# Patient Record
Sex: Female | Born: 1990 | Race: White | Hispanic: No | Marital: Single | Smoking: Current every day smoker
Health system: Southern US, Community
[De-identification: ages and names within clinical notes are randomized; demographics above are authoritative.]

## PROBLEM LIST (undated history)

## (undated) ENCOUNTER — Inpatient Hospital Stay: Payer: Self-pay

## (undated) ENCOUNTER — Inpatient Hospital Stay (HOSPITAL_COMMUNITY): Payer: Self-pay

## (undated) DIAGNOSIS — G43909 Migraine, unspecified, not intractable, without status migrainosus: Secondary | ICD-10-CM

## (undated) DIAGNOSIS — F329 Major depressive disorder, single episode, unspecified: Secondary | ICD-10-CM

## (undated) DIAGNOSIS — R569 Unspecified convulsions: Secondary | ICD-10-CM

## (undated) DIAGNOSIS — N83209 Unspecified ovarian cyst, unspecified side: Secondary | ICD-10-CM

## (undated) DIAGNOSIS — E119 Type 2 diabetes mellitus without complications: Secondary | ICD-10-CM

## (undated) DIAGNOSIS — R011 Cardiac murmur, unspecified: Secondary | ICD-10-CM

## (undated) DIAGNOSIS — O00109 Unspecified tubal pregnancy without intrauterine pregnancy: Secondary | ICD-10-CM

## (undated) DIAGNOSIS — F419 Anxiety disorder, unspecified: Secondary | ICD-10-CM

## (undated) DIAGNOSIS — D649 Anemia, unspecified: Secondary | ICD-10-CM

## (undated) DIAGNOSIS — F32A Depression, unspecified: Secondary | ICD-10-CM

## (undated) DIAGNOSIS — F319 Bipolar disorder, unspecified: Secondary | ICD-10-CM

## (undated) DIAGNOSIS — K8020 Calculus of gallbladder without cholecystitis without obstruction: Secondary | ICD-10-CM

## (undated) DIAGNOSIS — U071 COVID-19: Secondary | ICD-10-CM

## (undated) DIAGNOSIS — K219 Gastro-esophageal reflux disease without esophagitis: Secondary | ICD-10-CM

## (undated) DIAGNOSIS — Z9289 Personal history of other medical treatment: Secondary | ICD-10-CM

## (undated) DIAGNOSIS — F909 Attention-deficit hyperactivity disorder, unspecified type: Secondary | ICD-10-CM

## (undated) HISTORY — DX: Anxiety disorder, unspecified: F41.9

## (undated) HISTORY — PX: TONSILLECTOMY: SUR1361

## (undated) HISTORY — DX: Attention-deficit hyperactivity disorder, unspecified type: F90.9

## (undated) HISTORY — PX: OTHER SURGICAL HISTORY: SHX169

## (undated) HISTORY — DX: Bipolar disorder, unspecified: F31.9

## (undated) HISTORY — PX: DILATION AND CURETTAGE OF UTERUS: SHX78

## (undated) HISTORY — PX: TONSILLECTOMY AND ADENOIDECTOMY: SHX28

## (undated) MED FILL — Iron Sucrose Inj 20 MG/ML (Fe Equiv): INTRAVENOUS | Qty: 10 | Status: AC

---
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
## 2010-01-28 ENCOUNTER — Ambulatory Visit: Payer: Self-pay | Admitting: Obstetrics and Gynecology

## 2010-02-03 ENCOUNTER — Inpatient Hospital Stay: Payer: Self-pay | Admitting: Obstetrics and Gynecology

## 2010-06-25 ENCOUNTER — Emergency Department: Payer: Self-pay | Admitting: Emergency Medicine

## 2010-07-29 ENCOUNTER — Ambulatory Visit: Payer: Self-pay | Admitting: Unknown Physician Specialty

## 2010-08-01 LAB — PATHOLOGY REPORT

## 2010-08-21 DIAGNOSIS — K219 Gastro-esophageal reflux disease without esophagitis: Secondary | ICD-10-CM | POA: Insufficient documentation

## 2010-09-18 ENCOUNTER — Emergency Department (HOSPITAL_COMMUNITY)
Admission: EM | Admit: 2010-09-18 | Discharge: 2010-09-18 | Payer: Self-pay | Source: Home / Self Care | Admitting: Emergency Medicine

## 2010-11-05 ENCOUNTER — Emergency Department: Payer: Self-pay | Admitting: Emergency Medicine

## 2010-12-14 ENCOUNTER — Emergency Department: Payer: Self-pay | Admitting: Emergency Medicine

## 2010-12-15 ENCOUNTER — Emergency Department: Payer: Self-pay | Admitting: Emergency Medicine

## 2011-01-11 LAB — DIFFERENTIAL
Basophils Relative: 0 % (ref 0–1)
Eosinophils Absolute: 0.2 10*3/uL (ref 0.0–0.7)
Monocytes Relative: 6 % (ref 3–12)
Neutrophils Relative %: 70 % (ref 43–77)

## 2011-01-11 LAB — URINE MICROSCOPIC-ADD ON

## 2011-01-11 LAB — CBC
MCHC: 32.2 g/dL (ref 30.0–36.0)
MCV: 73.7 fL — ABNORMAL LOW (ref 78.0–100.0)
RBC: 5.51 MIL/uL — ABNORMAL HIGH (ref 3.87–5.11)

## 2011-01-11 LAB — URINALYSIS, ROUTINE W REFLEX MICROSCOPIC
Bilirubin Urine: NEGATIVE
Hgb urine dipstick: NEGATIVE
Specific Gravity, Urine: >1.03 — ABNORMAL HIGH (ref 1.005–1.030)
Urobilinogen, UA: 0.2 mg/dL (ref 0.0–1.0)
pH: 6 (ref 5.0–8.0)

## 2011-01-11 LAB — RPR: RPR Ser Ql: NONREACTIVE

## 2011-01-28 ENCOUNTER — Emergency Department: Payer: Self-pay | Admitting: Emergency Medicine

## 2011-02-10 ENCOUNTER — Ambulatory Visit: Payer: Self-pay | Admitting: Family Medicine

## 2011-02-17 ENCOUNTER — Ambulatory Visit: Payer: Self-pay | Admitting: Family Medicine
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
## 2011-03-06 ENCOUNTER — Emergency Department: Payer: Self-pay | Admitting: Emergency Medicine

## 2011-04-14 ENCOUNTER — Ambulatory Visit: Payer: Self-pay | Admitting: Emergency Medicine

## 2011-07-01 ENCOUNTER — Emergency Department: Payer: Self-pay | Admitting: Unknown Physician Specialty

## 2011-07-13 ENCOUNTER — Emergency Department: Payer: Self-pay | Admitting: Unknown Physician Specialty

## 2011-08-02 ENCOUNTER — Emergency Department: Payer: Self-pay | Admitting: Emergency Medicine

## 2011-08-08 ENCOUNTER — Inpatient Hospital Stay: Payer: Self-pay | Admitting: Psychiatry

## 2011-08-17 ENCOUNTER — Emergency Department: Payer: Self-pay | Admitting: *Deleted

## 2011-08-30 ENCOUNTER — Emergency Department: Payer: Self-pay | Admitting: Emergency Medicine

## 2011-09-01 ENCOUNTER — Inpatient Hospital Stay: Payer: Self-pay | Admitting: Psychiatry

## 2011-10-06 HISTORY — PX: SALPINGECTOMY: SHX328

## 2011-12-06 ENCOUNTER — Emergency Department: Payer: Self-pay | Admitting: Emergency Medicine

## 2011-12-06 LAB — URINALYSIS, COMPLETE
Blood: NEGATIVE
Glucose,UR: NEGATIVE mg/dL (ref 0–75)
Nitrite: NEGATIVE
RBC,UR: <1 /HPF (ref 0–5)
Specific Gravity: 1.021 (ref 1.003–1.030)
Squamous Epithelial: 7

## 2011-12-06 LAB — COMPREHENSIVE METABOLIC PANEL
Albumin: 3.9 g/dL (ref 3.4–5.0)
Alkaline Phosphatase: 76 U/L (ref 50–136)
Bilirubin,Total: 0.3 mg/dL (ref 0.2–1.0)
Calcium, Total: 9.2 mg/dL (ref 8.5–10.1)
Creatinine: 0.73 mg/dL (ref 0.60–1.30)
Glucose: 93 mg/dL (ref 65–99)
Osmolality: 278 (ref 275–301)
Sodium: 140 mmol/L (ref 136–145)
Total Protein: 8.2 g/dL (ref 6.4–8.2)

## 2011-12-06 LAB — CBC
MCHC: 32.8 g/dL (ref 32.0–36.0)
MCV: 79 fL — ABNORMAL LOW (ref 80–100)
Platelet: 359 10*3/uL (ref 150–440)
RDW: 15.1 % — ABNORMAL HIGH (ref 11.5–14.5)
WBC: 11.8 10*3/uL — ABNORMAL HIGH (ref 3.6–11.0)

## 2011-12-12 ENCOUNTER — Emergency Department: Payer: Self-pay | Admitting: Emergency Medicine

## 2011-12-12 LAB — URINALYSIS, COMPLETE
Bilirubin,UR: NEGATIVE
Ketone: NEGATIVE
Nitrite: NEGATIVE
Ph: 5 (ref 4.5–8.0)
Protein: NEGATIVE
RBC,UR: 8 /HPF (ref 0–5)
Specific Gravity: 1.021 (ref 1.003–1.030)
Squamous Epithelial: 22

## 2011-12-12 LAB — COMPREHENSIVE METABOLIC PANEL
Anion Gap: 12 (ref 7–16)
BUN: 8 mg/dL (ref 7–18)
Bilirubin,Total: 0.2 mg/dL (ref 0.2–1.0)
Chloride: 104 mmol/L (ref 98–107)
EGFR (African American): >60
EGFR (Non-African Amer.): >60
Potassium: 3.7 mmol/L (ref 3.5–5.1)
SGOT(AST): 17 U/L (ref 15–37)
Total Protein: 7.9 g/dL (ref 6.4–8.2)

## 2011-12-12 LAB — CBC
MCH: 25.8 pg — ABNORMAL LOW (ref 26.0–34.0)
MCHC: 33.1 g/dL (ref 32.0–36.0)
MCV: 78 fL — ABNORMAL LOW (ref 80–100)
Platelet: 326 10*3/uL (ref 150–440)
RBC: 5.16 10*6/uL (ref 3.80–5.20)
RDW: 15.4 % — ABNORMAL HIGH (ref 11.5–14.5)
WBC: 11.5 10*3/uL — ABNORMAL HIGH (ref 3.6–11.0)

## 2011-12-12 LAB — PREGNANCY, URINE: Pregnancy Test, Urine: NEGATIVE m[IU]/mL

## 2011-12-22 ENCOUNTER — Emergency Department: Payer: Self-pay | Admitting: Emergency Medicine

## 2011-12-22 LAB — URINALYSIS, COMPLETE
Blood: NEGATIVE
Glucose,UR: NEGATIVE mg/dL (ref 0–75)
Nitrite: NEGATIVE
Ph: 6 (ref 4.5–8.0)
Protein: NEGATIVE
Specific Gravity: 1.025 (ref 1.003–1.030)
WBC UR: 6 /HPF (ref 0–5)

## 2011-12-22 LAB — DRUG SCREEN, URINE
Amphetamines, Ur Screen: NEGATIVE (ref ?–1000)
Barbiturates, Ur Screen: POSITIVE (ref ?–200)
Benzodiazepine, Ur Scrn: NEGATIVE (ref ?–200)
Cocaine Metabolite,Ur ~~LOC~~: NEGATIVE (ref ?–300)
Methadone, Ur Screen: NEGATIVE (ref ?–300)
Opiate, Ur Screen: NEGATIVE (ref ?–300)
Phencyclidine (PCP) Ur S: NEGATIVE (ref ?–25)
Tricyclic, Ur Screen: NEGATIVE (ref ?–1000)

## 2011-12-22 LAB — PREGNANCY, URINE: Pregnancy Test, Urine: NEGATIVE m[IU]/mL

## 2011-12-23 LAB — BASIC METABOLIC PANEL
Anion Gap: 16 (ref 7–16)
BUN: 8 mg/dL (ref 7–18)
Creatinine: 0.74 mg/dL (ref 0.60–1.30)
EGFR (African American): >60
Glucose: 107 mg/dL — ABNORMAL HIGH (ref 65–99)
Sodium: 144 mmol/L (ref 136–145)

## 2011-12-23 LAB — CBC
HCT: 37 % (ref 35.0–47.0)
HGB: 12.1 g/dL (ref 12.0–16.0)
MCHC: 32.7 g/dL (ref 32.0–36.0)
MCV: 78 fL — ABNORMAL LOW (ref 80–100)
WBC: 12.2 10*3/uL — ABNORMAL HIGH (ref 3.6–11.0)

## 2011-12-23 LAB — CARBAMAZEPINE LEVEL, TOTAL: Carbamazepine: <0.5 ug/mL — ABNORMAL LOW (ref 4.0–12.0)

## 2012-01-18 ENCOUNTER — Emergency Department: Payer: Self-pay | Admitting: *Deleted

## 2012-02-17 ENCOUNTER — Emergency Department: Payer: Self-pay | Admitting: Unknown Physician Specialty

## 2012-02-17 LAB — CBC WITH DIFFERENTIAL/PLATELET
Basophil #: 0 10*3/uL (ref 0.0–0.1)
Eosinophil #: 0.5 10*3/uL (ref 0.0–0.7)
HGB: 13.1 g/dL (ref 12.0–16.0)
Lymphocyte %: 23.9 %
MCH: 25.4 pg — ABNORMAL LOW (ref 26.0–34.0)
Neutrophil #: 7.1 10*3/uL — ABNORMAL HIGH (ref 1.4–6.5)
Neutrophil %: 64.5 %
Platelet: 304 10*3/uL (ref 150–440)
WBC: 11 10*3/uL (ref 3.6–11.0)

## 2012-02-17 LAB — BASIC METABOLIC PANEL
Anion Gap: 9 (ref 7–16)
Chloride: 107 mmol/L (ref 98–107)
Co2: 26 mmol/L (ref 21–32)
Sodium: 142 mmol/L (ref 136–145)

## 2012-02-28 ENCOUNTER — Emergency Department: Payer: Self-pay | Admitting: Emergency Medicine

## 2012-02-29 ENCOUNTER — Encounter (HOSPITAL_COMMUNITY): Payer: Self-pay | Admitting: *Deleted

## 2012-02-29 ENCOUNTER — Emergency Department: Payer: Self-pay | Admitting: Emergency Medicine

## 2012-02-29 ENCOUNTER — Emergency Department (HOSPITAL_COMMUNITY)
Admission: EM | Admit: 2012-02-29 | Discharge: 2012-02-29 | Discharge status: Against Medical Advice | Payer: Self-pay | Attending: Emergency Medicine | Admitting: Emergency Medicine

## 2012-02-29 DIAGNOSIS — Z0389 Encounter for observation for other suspected diseases and conditions ruled out: Secondary | ICD-10-CM | POA: Insufficient documentation

## 2012-02-29 LAB — HEPATIC FUNCTION PANEL A (ARMC)
Alkaline Phosphatase: 76 U/L (ref 50–136)
Bilirubin, Direct: <0.1 mg/dL (ref 0.00–0.20)
SGOT(AST): 14 U/L — ABNORMAL LOW (ref 15–37)
SGPT (ALT): 23 U/L
Total Protein: 7.6 g/dL (ref 6.4–8.2)

## 2012-02-29 LAB — BASIC METABOLIC PANEL
BUN: 9 mg/dL (ref 7–18)
Calcium, Total: 9 mg/dL (ref 8.5–10.1)
Co2: 25 mmol/L (ref 21–32)
EGFR (African American): >60
EGFR (Non-African Amer.): >60
Potassium: 3.9 mmol/L (ref 3.5–5.1)

## 2012-02-29 LAB — CBC
HCT: 41.2 % (ref 35.0–47.0)
HGB: 13 g/dL (ref 12.0–16.0)
MCV: 78 fL — ABNORMAL LOW (ref 80–100)
Platelet: 359 10*3/uL (ref 150–440)
RBC: 5.31 10*6/uL — ABNORMAL HIGH (ref 3.80–5.20)
RDW: 15.3 % — ABNORMAL HIGH (ref 11.5–14.5)
WBC: 11 10*3/uL (ref 3.6–11.0)

## 2012-02-29 LAB — URINALYSIS, COMPLETE
Bacteria: NONE SEEN
Nitrite: NEGATIVE
Protein: NEGATIVE
RBC,UR: <1 /HPF (ref 0–5)
Specific Gravity: 1.005 (ref 1.003–1.030)
Squamous Epithelial: 1

## 2012-02-29 NOTE — ED Notes (Signed)
Pt no in waiting room no answer x2

## 2012-02-29 NOTE — ED Notes (Signed)
abd pain with nv for 2 days earache and sorethroat today

## 2012-02-29 NOTE — ED Notes (Signed)
Patient not located in the WR at this time

## 2012-02-29 NOTE — ED Notes (Signed)
Pt not in triage or answer in waiting room

## 2012-02-29 NOTE — ED Notes (Signed)
The pt says she cannot void at present 

## 2012-03-02 ENCOUNTER — Emergency Department: Payer: Self-pay | Admitting: Emergency Medicine

## 2012-03-08 ENCOUNTER — Ambulatory Visit: Payer: Self-pay | Admitting: Surgery

## 2012-03-08 LAB — HEPATIC FUNCTION PANEL A (ARMC)
Alkaline Phosphatase: 86 U/L (ref 50–136)
Bilirubin, Direct: <0.1 mg/dL (ref 0.00–0.20)
Bilirubin,Total: 0.2 mg/dL (ref 0.2–1.0)
SGOT(AST): 14 U/L — ABNORMAL LOW (ref 15–37)
Total Protein: 7.5 g/dL (ref 6.4–8.2)

## 2012-03-09 ENCOUNTER — Emergency Department: Payer: Self-pay | Admitting: Emergency Medicine

## 2012-03-09 ENCOUNTER — Ambulatory Visit: Payer: Self-pay | Admitting: Surgery

## 2012-03-09 LAB — COMPREHENSIVE METABOLIC PANEL
Anion Gap: 10 (ref 7–16)
Bilirubin,Total: 0.4 mg/dL (ref 0.2–1.0)
Chloride: 107 mmol/L (ref 98–107)
Co2: 23 mmol/L (ref 21–32)
Creatinine: 0.61 mg/dL (ref 0.60–1.30)
EGFR (African American): >60
EGFR (Non-African Amer.): >60
Osmolality: 277 (ref 275–301)
SGPT (ALT): 19 U/L
Sodium: 140 mmol/L (ref 136–145)
Total Protein: 7.7 g/dL (ref 6.4–8.2)

## 2012-03-09 LAB — CBC
HCT: 42 % (ref 35.0–47.0)
MCH: 24.5 pg — ABNORMAL LOW (ref 26.0–34.0)
MCHC: 31.2 g/dL — ABNORMAL LOW (ref 32.0–36.0)
MCV: 78 fL — ABNORMAL LOW (ref 80–100)
Platelet: 312 10*3/uL (ref 150–440)
RBC: 5.36 10*6/uL — ABNORMAL HIGH (ref 3.80–5.20)
RDW: 15.7 % — ABNORMAL HIGH (ref 11.5–14.5)
WBC: 10.7 10*3/uL (ref 3.6–11.0)

## 2012-03-18 ENCOUNTER — Emergency Department: Payer: Self-pay | Admitting: Unknown Physician Specialty

## 2012-03-18 LAB — URINALYSIS, COMPLETE
Bilirubin,UR: NEGATIVE
Glucose,UR: NEGATIVE mg/dL (ref 0–75)
Ketone: NEGATIVE
Nitrite: NEGATIVE
Ph: 7 (ref 4.5–8.0)
RBC,UR: 11 /HPF (ref 0–5)
Specific Gravity: 1.02 (ref 1.003–1.030)
Squamous Epithelial: 23
WBC UR: 10 /HPF (ref 0–5)

## 2012-03-18 LAB — CBC
HCT: 42 % (ref 35.0–47.0)
HGB: 13.2 g/dL (ref 12.0–16.0)
MCH: 24.5 pg — ABNORMAL LOW (ref 26.0–34.0)
MCHC: 31.4 g/dL — ABNORMAL LOW (ref 32.0–36.0)
Platelet: 365 10*3/uL (ref 150–440)
RBC: 5.39 10*6/uL — ABNORMAL HIGH (ref 3.80–5.20)
RDW: 15.7 % — ABNORMAL HIGH (ref 11.5–14.5)

## 2012-03-18 LAB — COMPREHENSIVE METABOLIC PANEL
Albumin: 3.6 g/dL (ref 3.4–5.0)
BUN: 6 mg/dL — ABNORMAL LOW (ref 7–18)
Bilirubin,Total: 0.5 mg/dL (ref 0.2–1.0)
Chloride: 106 mmol/L (ref 98–107)
Co2: 23 mmol/L (ref 21–32)
Creatinine: 0.5 mg/dL — ABNORMAL LOW (ref 0.60–1.30)
EGFR (Non-African Amer.): >60
Glucose: 83 mg/dL (ref 65–99)
Osmolality: 271 (ref 275–301)
Potassium: 4.4 mmol/L (ref 3.5–5.1)
SGOT(AST): 22 U/L (ref 15–37)
SGPT (ALT): 18 U/L
Sodium: 137 mmol/L (ref 136–145)
Total Protein: 7.8 g/dL (ref 6.4–8.2)

## 2012-03-18 LAB — PREGNANCY, URINE: Pregnancy Test, Urine: NEGATIVE m[IU]/mL

## 2012-03-19 ENCOUNTER — Emergency Department: Payer: Self-pay | Admitting: Unknown Physician Specialty

## 2012-03-19 LAB — CBC
HCT: 42.2 % (ref 35.0–47.0)
HGB: 13 g/dL (ref 12.0–16.0)
MCH: 24.3 pg — ABNORMAL LOW (ref 26.0–34.0)
MCHC: 30.9 g/dL — ABNORMAL LOW (ref 32.0–36.0)
MCV: 79 fL — ABNORMAL LOW (ref 80–100)
RBC: 5.37 10*6/uL — ABNORMAL HIGH (ref 3.80–5.20)

## 2012-03-19 LAB — URINALYSIS, COMPLETE
Glucose,UR: NEGATIVE mg/dL (ref 0–75)
Nitrite: NEGATIVE
Ph: 7 (ref 4.5–8.0)
Protein: 100
RBC,UR: 8 /HPF (ref 0–5)
Specific Gravity: 1.017 (ref 1.003–1.030)

## 2012-03-19 LAB — PREGNANCY, URINE: Pregnancy Test, Urine: NEGATIVE m[IU]/mL

## 2012-03-19 LAB — HCG, QUANTITATIVE, PREGNANCY: Beta Hcg, Quant.: <1 m[IU]/mL — ABNORMAL LOW

## 2012-03-26 ENCOUNTER — Emergency Department: Payer: Self-pay | Admitting: Emergency Medicine

## 2012-04-13 ENCOUNTER — Emergency Department: Payer: Self-pay | Admitting: Internal Medicine

## 2012-04-13 LAB — URINALYSIS, COMPLETE
Glucose,UR: NEGATIVE mg/dL (ref 0–75)
RBC,UR: 34 /HPF (ref 0–5)
Specific Gravity: 1.029 (ref 1.003–1.030)
Squamous Epithelial: 11

## 2012-04-13 LAB — CBC
HCT: 39.6 % (ref 35.0–47.0)
HGB: 12.6 g/dL (ref 12.0–16.0)
MCV: 77 fL — ABNORMAL LOW (ref 80–100)
Platelet: 347 10*3/uL (ref 150–440)
RBC: 5.17 10*6/uL (ref 3.80–5.20)
WBC: 11.4 10*3/uL — ABNORMAL HIGH (ref 3.6–11.0)

## 2012-04-13 LAB — HCG, QUANTITATIVE, PREGNANCY: Beta Hcg, Quant.: <1 m[IU]/mL — ABNORMAL LOW

## 2012-04-18 ENCOUNTER — Emergency Department: Payer: Self-pay | Admitting: Emergency Medicine

## 2012-05-02 ENCOUNTER — Emergency Department: Payer: Self-pay | Admitting: Emergency Medicine

## 2012-05-23 ENCOUNTER — Emergency Department: Payer: Self-pay | Admitting: Emergency Medicine

## 2012-05-25 ENCOUNTER — Emergency Department: Payer: Self-pay | Admitting: Emergency Medicine

## 2012-05-26 LAB — CBC
HCT: 39.2 % (ref 35.0–47.0)
HGB: 12.7 g/dL (ref 12.0–16.0)
MCH: 24.4 pg — ABNORMAL LOW (ref 26.0–34.0)
MCHC: 32.3 g/dL (ref 32.0–36.0)
RDW: 15.6 % — ABNORMAL HIGH (ref 11.5–14.5)

## 2012-05-26 LAB — COMPREHENSIVE METABOLIC PANEL
Alkaline Phosphatase: 88 U/L (ref 50–136)
Anion Gap: 7 (ref 7–16)
BUN: 16 mg/dL (ref 7–18)
Bilirubin,Total: 0.1 mg/dL — ABNORMAL LOW (ref 0.2–1.0)
Chloride: 108 mmol/L — ABNORMAL HIGH (ref 98–107)
Co2: 27 mmol/L (ref 21–32)
Creatinine: 0.93 mg/dL (ref 0.60–1.30)
EGFR (African American): >60
Glucose: 103 mg/dL — ABNORMAL HIGH (ref 65–99)
Potassium: 3.7 mmol/L (ref 3.5–5.1)
SGOT(AST): 21 U/L (ref 15–37)
SGPT (ALT): 20 U/L (ref 12–78)
Sodium: 142 mmol/L (ref 136–145)
Total Protein: 7.6 g/dL (ref 6.4–8.2)

## 2012-05-26 LAB — TSH: Thyroid Stimulating Horm: 2.58 u[IU]/mL

## 2012-06-17 ENCOUNTER — Emergency Department: Payer: Self-pay | Admitting: Emergency Medicine

## 2012-06-17 LAB — CBC WITH DIFFERENTIAL/PLATELET
Basophil #: 0.1 10*3/uL (ref 0.0–0.1)
Basophil %: 0.7 %
Eosinophil #: 0.2 10*3/uL (ref 0.0–0.7)
Eosinophil %: 1.6 %
Lymphocyte #: 2.9 10*3/uL (ref 1.0–3.6)
Lymphocyte %: 24.3 %
MCH: 23.5 pg — ABNORMAL LOW (ref 26.0–34.0)
MCHC: 31.3 g/dL — ABNORMAL LOW (ref 32.0–36.0)
MCV: 75 fL — ABNORMAL LOW (ref 80–100)
Monocyte #: 0.9 x10 3/mm (ref 0.2–0.9)
Monocyte %: 8 %
Neutrophil #: 7.7 10*3/uL — ABNORMAL HIGH (ref 1.4–6.5)
Platelet: 331 10*3/uL (ref 150–440)
RDW: 15.6 % — ABNORMAL HIGH (ref 11.5–14.5)
WBC: 11.8 10*3/uL — ABNORMAL HIGH (ref 3.6–11.0)

## 2012-06-17 LAB — BASIC METABOLIC PANEL
Anion Gap: 9 (ref 7–16)
BUN: 10 mg/dL (ref 7–18)
Calcium, Total: 9 mg/dL (ref 8.5–10.1)
Chloride: 107 mmol/L (ref 98–107)
Co2: 26 mmol/L (ref 21–32)
Creatinine: 0.77 mg/dL (ref 0.60–1.30)
Glucose: 93 mg/dL (ref 65–99)
Osmolality: 282 (ref 275–301)
Potassium: 3.7 mmol/L (ref 3.5–5.1)
Sodium: 142 mmol/L (ref 136–145)

## 2012-06-17 LAB — URINALYSIS, COMPLETE
Bilirubin,UR: NEGATIVE
Ketone: NEGATIVE
Ph: 6 (ref 4.5–8.0)
Specific Gravity: 1.026 (ref 1.003–1.030)
Squamous Epithelial: 5

## 2012-07-09 ENCOUNTER — Emergency Department: Payer: Self-pay | Admitting: Emergency Medicine

## 2012-07-09 LAB — BASIC METABOLIC PANEL
Anion Gap: 11 (ref 7–16)
BUN: 7 mg/dL (ref 7–18)
Chloride: 107 mmol/L (ref 98–107)
Creatinine: 0.75 mg/dL (ref 0.60–1.30)
EGFR (African American): >60
Glucose: 93 mg/dL (ref 65–99)
Sodium: 142 mmol/L (ref 136–145)

## 2012-07-09 LAB — CBC
HCT: 41.3 % (ref 35.0–47.0)
HGB: 13.4 g/dL (ref 12.0–16.0)
MCH: 24.4 pg — ABNORMAL LOW (ref 26.0–34.0)
MCHC: 32.3 g/dL (ref 32.0–36.0)
MCV: 75 fL — ABNORMAL LOW (ref 80–100)
RBC: 5.48 10*6/uL — ABNORMAL HIGH (ref 3.80–5.20)
RDW: 15.7 % — ABNORMAL HIGH (ref 11.5–14.5)
WBC: 12.9 10*3/uL — ABNORMAL HIGH (ref 3.6–11.0)

## 2012-07-09 LAB — URINALYSIS, COMPLETE
Bilirubin,UR: NEGATIVE
Blood: NEGATIVE
Glucose,UR: NEGATIVE mg/dL (ref 0–75)
Ketone: NEGATIVE
Ph: 6 (ref 4.5–8.0)
Protein: NEGATIVE
RBC,UR: 2 /HPF (ref 0–5)
Squamous Epithelial: 6

## 2012-07-18 ENCOUNTER — Emergency Department: Payer: Self-pay | Admitting: Emergency Medicine

## 2012-07-18 LAB — BASIC METABOLIC PANEL
Calcium, Total: 8.8 mg/dL (ref 8.5–10.1)
Co2: 25 mmol/L (ref 21–32)
EGFR (African American): >60
Glucose: 91 mg/dL (ref 65–99)
Sodium: 140 mmol/L (ref 136–145)

## 2012-07-18 LAB — CBC WITH DIFFERENTIAL/PLATELET
Basophil %: 0.9 %
Eosinophil %: 2.1 %
Lymphocyte #: 3.1 10*3/uL (ref 1.0–3.6)
Lymphocyte %: 22.7 %
MCH: 24.8 pg — ABNORMAL LOW (ref 26.0–34.0)
MCV: 75 fL — ABNORMAL LOW (ref 80–100)
Monocyte %: 6.6 %
Neutrophil #: 9.3 10*3/uL — ABNORMAL HIGH (ref 1.4–6.5)
WBC: 13.8 10*3/uL — ABNORMAL HIGH (ref 3.6–11.0)

## 2012-07-18 LAB — URINALYSIS, COMPLETE
Bacteria: NONE SEEN
Leukocyte Esterase: NEGATIVE
Ph: 6 (ref 4.5–8.0)
Protein: NEGATIVE
RBC,UR: 2 /HPF (ref 0–5)
Squamous Epithelial: 4

## 2012-07-18 LAB — VALPROIC ACID LEVEL: Valproic Acid: <3 ug/mL — ABNORMAL LOW

## 2012-08-01 ENCOUNTER — Inpatient Hospital Stay: Payer: Self-pay | Admitting: Psychiatry

## 2012-08-01 LAB — URINALYSIS, COMPLETE
Bilirubin,UR: NEGATIVE
Ketone: NEGATIVE
Nitrite: NEGATIVE
Protein: NEGATIVE
Specific Gravity: 1.02 (ref 1.003–1.030)
Squamous Epithelial: 22
WBC UR: 15 /HPF (ref 0–5)

## 2012-08-01 LAB — COMPREHENSIVE METABOLIC PANEL
Alkaline Phosphatase: 72 U/L (ref 50–136)
Anion Gap: 9 (ref 7–16)
Calcium, Total: 8.8 mg/dL (ref 8.5–10.1)
EGFR (African American): >60
Glucose: 85 mg/dL (ref 65–99)
Potassium: 4 mmol/L (ref 3.5–5.1)
SGOT(AST): 20 U/L (ref 15–37)
SGPT (ALT): 24 U/L (ref 12–78)

## 2012-08-01 LAB — DRUG SCREEN, URINE
Barbiturates, Ur Screen: NEGATIVE (ref ?–200)
Cannabinoid 50 Ng, Ur ~~LOC~~: NEGATIVE (ref ?–50)
Cocaine Metabolite,Ur ~~LOC~~: NEGATIVE (ref ?–300)
Opiate, Ur Screen: NEGATIVE (ref ?–300)

## 2012-08-01 LAB — CBC
Platelet: 323 10*3/uL (ref 150–440)
RDW: 16.3 % — ABNORMAL HIGH (ref 11.5–14.5)
WBC: 10.7 10*3/uL (ref 3.6–11.0)

## 2012-08-01 LAB — ETHANOL: Ethanol: <3 mg/dL

## 2012-12-20 ENCOUNTER — Emergency Department: Payer: Self-pay | Admitting: Emergency Medicine

## 2012-12-29 ENCOUNTER — Emergency Department: Payer: Self-pay | Admitting: Emergency Medicine

## 2012-12-29 LAB — URINALYSIS, COMPLETE
Ketone: NEGATIVE
Ph: 5 (ref 4.5–8.0)
Specific Gravity: 1.02 (ref 1.003–1.030)
Squamous Epithelial: 18
WBC UR: 9 /HPF (ref 0–5)

## 2012-12-29 LAB — COMPREHENSIVE METABOLIC PANEL
Alkaline Phosphatase: 81 U/L (ref 50–136)
BUN: 9 mg/dL (ref 7–18)
Co2: 27 mmol/L (ref 21–32)
Creatinine: 0.78 mg/dL (ref 0.60–1.30)
EGFR (Non-African Amer.): >60
Potassium: 4 mmol/L (ref 3.5–5.1)
Sodium: 136 mmol/L (ref 136–145)
Total Protein: 7.9 g/dL (ref 6.4–8.2)

## 2012-12-29 LAB — CBC
HGB: 13.4 g/dL (ref 12.0–16.0)
MCH: 24.7 pg — ABNORMAL LOW (ref 26.0–34.0)
MCHC: 32.3 g/dL (ref 32.0–36.0)
MCV: 76 fL — ABNORMAL LOW (ref 80–100)
RBC: 5.42 10*6/uL — ABNORMAL HIGH (ref 3.80–5.20)
RDW: 15.8 % — ABNORMAL HIGH (ref 11.5–14.5)

## 2012-12-29 LAB — PREGNANCY, URINE: Pregnancy Test, Urine: NEGATIVE m[IU]/mL

## 2012-12-29 LAB — WET PREP, GENITAL

## 2012-12-30 LAB — URINE CULTURE

## 2013-03-10 ENCOUNTER — Emergency Department: Payer: Self-pay | Admitting: Emergency Medicine

## 2013-03-10 LAB — URINALYSIS, COMPLETE
RBC,UR: 13 /HPF (ref 0–5)
Specific Gravity: 1.025 (ref 1.003–1.030)
Squamous Epithelial: 25
WBC UR: 10 /HPF (ref 0–5)

## 2013-03-10 LAB — CBC
HGB: 13.4 g/dL (ref 12.0–16.0)
MCH: 24.4 pg — ABNORMAL LOW (ref 26.0–34.0)
MCHC: 32.5 g/dL (ref 32.0–36.0)
WBC: 12.3 10*3/uL — ABNORMAL HIGH (ref 3.6–11.0)

## 2013-03-27 ENCOUNTER — Emergency Department: Payer: Self-pay | Admitting: Emergency Medicine

## 2013-03-27 LAB — COMPREHENSIVE METABOLIC PANEL
Albumin: 3.5 g/dL (ref 3.4–5.0)
Alkaline Phosphatase: 96 U/L (ref 50–136)
Anion Gap: 6 — ABNORMAL LOW (ref 7–16)
Calcium, Total: 8.8 mg/dL (ref 8.5–10.1)
Co2: 27 mmol/L (ref 21–32)
EGFR (African American): >60
EGFR (Non-African Amer.): >60
Glucose: 90 mg/dL (ref 65–99)
Osmolality: 275 (ref 275–301)
SGOT(AST): 12 U/L — ABNORMAL LOW (ref 15–37)
SGPT (ALT): 26 U/L (ref 12–78)

## 2013-03-27 LAB — CBC
HCT: 37.8 % (ref 35.0–47.0)
HGB: 12.6 g/dL (ref 12.0–16.0)
MCH: 24.7 pg — ABNORMAL LOW (ref 26.0–34.0)
MCHC: 33.3 g/dL (ref 32.0–36.0)
MCV: 74 fL — ABNORMAL LOW (ref 80–100)
Platelet: 357 10*3/uL (ref 150–440)
RBC: 5.1 10*6/uL (ref 3.80–5.20)
RDW: 15.8 % — ABNORMAL HIGH (ref 11.5–14.5)
WBC: 12.5 10*3/uL — ABNORMAL HIGH (ref 3.6–11.0)

## 2013-03-27 LAB — ETHANOL
Ethanol %: <0.003 % (ref 0.000–0.080)
Ethanol: <3 mg/dL

## 2013-05-01 ENCOUNTER — Emergency Department (HOSPITAL_COMMUNITY): Payer: Medicaid Other

## 2013-05-01 ENCOUNTER — Emergency Department (HOSPITAL_COMMUNITY)
Admission: EM | Admit: 2013-05-01 | Discharge: 2013-05-01 | Disposition: A | Discharge status: Home / Self Care | Payer: Medicaid Other | Attending: Emergency Medicine | Admitting: Emergency Medicine

## 2013-05-01 ENCOUNTER — Encounter (HOSPITAL_COMMUNITY): Payer: Self-pay

## 2013-05-01 DIAGNOSIS — E119 Type 2 diabetes mellitus without complications: Secondary | ICD-10-CM | POA: Insufficient documentation

## 2013-05-01 DIAGNOSIS — G40909 Epilepsy, unspecified, not intractable, without status epilepticus: Secondary | ICD-10-CM | POA: Insufficient documentation

## 2013-05-01 DIAGNOSIS — N949 Unspecified condition associated with female genital organs and menstrual cycle: Secondary | ICD-10-CM | POA: Insufficient documentation

## 2013-05-01 DIAGNOSIS — J45909 Unspecified asthma, uncomplicated: Secondary | ICD-10-CM | POA: Insufficient documentation

## 2013-05-01 DIAGNOSIS — Z3202 Encounter for pregnancy test, result negative: Secondary | ICD-10-CM | POA: Insufficient documentation

## 2013-05-01 DIAGNOSIS — F17200 Nicotine dependence, unspecified, uncomplicated: Secondary | ICD-10-CM | POA: Insufficient documentation

## 2013-05-01 DIAGNOSIS — Z79899 Other long term (current) drug therapy: Secondary | ICD-10-CM | POA: Insufficient documentation

## 2013-05-01 DIAGNOSIS — Z9889 Other specified postprocedural states: Secondary | ICD-10-CM | POA: Insufficient documentation

## 2013-05-01 DIAGNOSIS — R102 Pelvic and perineal pain: Secondary | ICD-10-CM

## 2013-05-01 HISTORY — DX: Type 2 diabetes mellitus without complications: E11.9

## 2013-05-01 HISTORY — DX: Unspecified convulsions: R56.9

## 2013-05-01 HISTORY — DX: Unspecified tubal pregnancy without intrauterine pregnancy: O00.109

## 2013-05-01 LAB — URINE MICROSCOPIC-ADD ON

## 2013-05-01 LAB — URINALYSIS, ROUTINE W REFLEX MICROSCOPIC
Ketones, ur: NEGATIVE mg/dL
Leukocytes, UA: NEGATIVE
Nitrite: NEGATIVE
Protein, ur: NEGATIVE mg/dL
Urobilinogen, UA: 0.2 mg/dL (ref 0.0–1.0)

## 2013-05-01 LAB — PREGNANCY, URINE: Preg Test, Ur: NEGATIVE

## 2013-05-01 MED ORDER — OXYCODONE-ACETAMINOPHEN 5-325 MG PO TABS: 1.0000 | ORAL_TABLET | ORAL | Status: DC | PRN

## 2013-05-01 MED ORDER — OXYCODONE-ACETAMINOPHEN 5-325 MG PO TABS
2.0000 | ORAL_TABLET | Freq: Once | ORAL | Status: AC
  Administered 2013-05-01: 2 via ORAL
  Filled 2013-05-01: qty 2

## 2013-05-01 NOTE — ED Provider Notes (Signed)
CSN: [id number]     Arrival date & time 05/01/13  1341 History     First MD Initiated Contact with Patient 05/01/13 1443     Chief Complaint  Patient presents with  . Abdominal Pain   (Consider location/radiation/quality/duration/timing/severity/associated sxs/prior Treatment) HPI This a 22 year old female who comes in today complaining of suprapubic pain. She states she's had this pain since she had a miscarriage in mid June. She had a D&C performed at a hospital in [place]. She states she's continued to have some discharge since that time. She's continued to have lower abdominal pain. She had a nuva ring placed and removed two weeks later as she reported she could not tolerate it.  She denies any d/c other than bleeding, fever, chills, nausea, or vomiting.  She has had 4 pregnancies 3 miscarriages and one tubal pregnancy. She denies any urinary tract infection symptoms. Past Medical History  Diagnosis Date  . Asthma   . Seizures   . Diabetes mellitus without complication   . Tubal pregnancy    Past Surgical History  Procedure Laterality Date  . Dilation and curettage of uterus    . Ovarian cyst removed    . Tonsillectomy    . Tonsillectomy and adenoidectomy     No family history on file. History  Substance Use Topics  . Smoking status: Current Every Day Smoker    Types: Cigarettes  . Smokeless tobacco: Not on file  . Alcohol Use: Yes     Comment: occ   OB History   Grav Para Term Preterm Abortions TAB SAB Ect Mult Living                 Review of Systems  All other systems reviewed and are negative.    Allergies  Morphine and related; Sulfa antibiotics; Tomato; Toradol; Tramadol; and Latex  Home Medications   Current Outpatient Rx  Name  Route  Sig  Dispense  Refill  . albuterol (PROVENTIL HFA;VENTOLIN HFA) 108 (90 BASE) MCG/ACT inhaler   Inhalation   Inhale 2 puffs into the lungs every 6 (six) hours as needed. For shortness of breath         . clonazePAM  (KLONOPIN) 1 MG tablet   Oral   Take 1 mg by mouth 2 (two) times daily.          BP 109/44  Pulse 86  Temp(Src) 98 F (36.7 C) (Oral)  Resp 18  Ht 5' (1.524 m)  Wt 180 lb (81.647 kg)  BMI 35.15 kg/m2  SpO2 99% Physical Exam  Nursing note and vitals reviewed. Constitutional: She is oriented to person, place, and time. She appears well-developed and well-nourished.  HENT:  Head: Atraumatic.  Right Ear: External ear normal.  Left Ear: External ear normal.  Eyes: Conjunctivae and EOM are normal. Pupils are equal, round, and reactive to light.  Neck: Normal range of motion. Neck supple.  Cardiovascular: Normal rate, regular rhythm, normal heart sounds and intact distal pulses.   Pulmonary/Chest: Effort normal and breath sounds normal.  Abdominal: Soft. Bowel sounds are normal.  Genitourinary: Vagina normal.  Mild brownish discharge in vaginal vault no other abnormality noted. Mild tenderness to palpation of cervix and uterus.  Musculoskeletal: Normal range of motion.  Neurological: She is alert and oriented to person, place, and time.  Skin: Skin is warm and dry.  Psychiatric: She has a normal mood and affect. Her behavior is normal. Judgment and thought content normal.    ED Course  Procedures (including critical care time)  Labs Reviewed  WET PREP, GENITAL - Abnormal; Notable for the following:    WBC, Wet Prep HPF POC FEW (*)    All other components within normal limits  URINALYSIS, ROUTINE W REFLEX MICROSCOPIC - Abnormal; Notable for the following:    Specific Gravity, Urine >1.030 (*)    Hgb urine dipstick SMALL (*)    All other components within normal limits  URINE MICROSCOPIC-ADD ON - Abnormal; Notable for the following:    Squamous Epithelial / LPF FEW (*)    All other components within normal limits  GC/CHLAMYDIA PROBE AMP  PREGNANCY, URINE   No results found. No diagnosis found.  MDM  Pregnancy is negative urine does not show evidence of acute  infection. Wet prep is normal. Ultrasound is pending of the uterus to evaluate for any signs of retained products of conception or abscess.   Patient's care is signed out to Dr. Lynelle Doctor- if ultrasound is normal she is stable for discharge.  Hilario Quarry, MD 05/01/13 [phone]

## 2013-05-01 NOTE — ED Notes (Addendum)
Pt reports ab pain since June 18th (date she had a miscarriage), pain is daily,  Pain is constant and had a nova ring placed last week and removed.   Denies any nausea or vomiting, no fever. No diarrhea. Bleeding since June 18th.  Has followed up w/ [facility].    Denies any urinary s/s, no other vaginal discharge other than blood.

## 2013-05-01 NOTE — ED Provider Notes (Addendum)
Patient left with me at change of shift to check her ultrasound report which is normal. Pt given results of her [place]. Patient expressed concern that she was told by her PCP that she had abnormal cells on her cervix and a swollen cervix. She states she has an appointment with a GYN. She was advised to keep that appointment and they can address those issues. Explained we do not do PAP smears in the ED.   US Transvaginal Non-ob  US Pelvis Complete  05/01/2013   *RADIOLOGY REPORT*  Clinical Data: Pelvic pain.  TRANSABDOMINAL AND TRANSVAGINAL ULTRASOUND OF PELVIS DOPPLER ULTRASOUND OF OVARIES  Technique:  Both transabdominal and transvaginal ultrasound examinations of the pelvis were performed including evaluation of the uterus, ovaries, adnexal regions, and pelvic cul-de-sac. Color and duplex Doppler ultrasound was utilized to evaluate blood flow to the ovaries.  Comparison: CT 04/09/2009  Findings:  Uterus: 6.2 x 3.2 x 4.1 cm.  Normal echotexture.  No focal abnormality.  Endometrium: Normal appearance and thickness, 4 mm.  Right Ovary: 3.2 x 2.8 x 2.1 cm.  Color Doppler flow noted. Normal size and echotexture.  No adnexal masses.  Left Ovary: 3.1 x 1.9 x 2.0 cm.  Color Doppler flow noted. Normal size and echotexture.  No adnexal masses.  Other Findings:  No free fluid.  IMPRESSION: Unremarkable study.   Original Report Authenticated By: Charlett Nose, M.D.    Diagnoses that have been ruled out:  None  Diagnoses that are still under consideration:  None  Final diagnoses:  Pelvic pain   New Prescriptions   OXYCODONE-ACETAMINOPHEN (PERCOCET/ROXICET) 5-325 MG PER TABLET    Take 1 tablet by mouth every 4 (four) hours as needed for pain.    Plan discharge   Devoria Albe, MD, Franz Dell, MD 05/01/13 1635  Ward Givens, MD 05/01/13 [phone]

## 2013-05-07 ENCOUNTER — Encounter (HOSPITAL_COMMUNITY): Payer: Self-pay | Admitting: *Deleted

## 2013-05-07 ENCOUNTER — Emergency Department (HOSPITAL_COMMUNITY)
Admission: EM | Admit: 2013-05-07 | Discharge: 2013-05-08 | Disposition: A | Discharge status: Home / Self Care | Payer: Medicaid Other | Attending: Emergency Medicine | Admitting: Emergency Medicine

## 2013-05-07 DIAGNOSIS — G40909 Epilepsy, unspecified, not intractable, without status epilepticus: Secondary | ICD-10-CM | POA: Insufficient documentation

## 2013-05-07 DIAGNOSIS — H53149 Visual discomfort, unspecified: Secondary | ICD-10-CM | POA: Insufficient documentation

## 2013-05-07 DIAGNOSIS — R519 Headache, unspecified: Secondary | ICD-10-CM

## 2013-05-07 DIAGNOSIS — F17200 Nicotine dependence, unspecified, uncomplicated: Secondary | ICD-10-CM | POA: Insufficient documentation

## 2013-05-07 DIAGNOSIS — R51 Headache: Secondary | ICD-10-CM | POA: Insufficient documentation

## 2013-05-07 DIAGNOSIS — E119 Type 2 diabetes mellitus without complications: Secondary | ICD-10-CM | POA: Insufficient documentation

## 2013-05-07 DIAGNOSIS — Z91040 Latex allergy status: Secondary | ICD-10-CM | POA: Insufficient documentation

## 2013-05-07 DIAGNOSIS — Z79899 Other long term (current) drug therapy: Secondary | ICD-10-CM | POA: Insufficient documentation

## 2013-05-07 DIAGNOSIS — J45909 Unspecified asthma, uncomplicated: Secondary | ICD-10-CM | POA: Insufficient documentation

## 2013-05-07 LAB — POCT I-STAT, CHEM 8
BUN: 5 mg/dL — ABNORMAL LOW (ref 6–23)
Calcium, Ion: 1.15 mmol/L (ref 1.12–1.23)
Chloride: 106 mEq/L (ref 96–112)
Creatinine, Ser: 0.7 mg/dL (ref 0.50–1.10)
Glucose, Bld: 89 mg/dL (ref 70–99)
HCT: 38 % (ref 36.0–46.0)
Hemoglobin: 12.9 g/dL (ref 12.0–15.0)
Potassium: 3.8 mEq/L (ref 3.5–5.1)
Sodium: 138 mEq/L (ref 135–145)
TCO2: 22 mmol/L (ref 0–100)

## 2013-05-07 MED ORDER — METOCLOPRAMIDE HCL 5 MG/ML IJ SOLN
10.0000 mg | Freq: Once | INTRAMUSCULAR | Status: AC
  Administered 2013-05-07: 10 mg via INTRAVENOUS
  Filled 2013-05-07: qty 2

## 2013-05-07 MED ORDER — SODIUM CHLORIDE 0.9 % IV BOLUS (SEPSIS)
1000.0000 mL | Freq: Once | INTRAVENOUS | Status: AC
  Administered 2013-05-07: 1000 mL via INTRAVENOUS

## 2013-05-07 MED ORDER — DIPHENHYDRAMINE HCL 50 MG/ML IJ SOLN
25.0000 mg | Freq: Once | INTRAMUSCULAR | Status: AC
  Administered 2013-05-07: via INTRAVENOUS
  Filled 2013-05-07: qty 1

## 2013-05-07 MED ORDER — IBUPROFEN 400 MG PO TABS
600.0000 mg | ORAL_TABLET | Freq: Once | ORAL | Status: AC
  Administered 2013-05-07: 600 mg via ORAL
  Filled 2013-05-07: qty 2
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
## 2013-05-08 NOTE — ED Notes (Signed)
Patient lying in bed, eyes flickering, grabbing hold of sides of mattress. Patients fiance states that this is how she acts when she is having her seizures. He also states that her mother is a Engineer, civil (consulting) and she told him that the patient needed to be on Dilantin. I asked the patient what was going on and she states that her head is spinning and that she doesn't feel good. Patient able to sit up in bed and take Ibuprofen without any problems. No seizure activity noted at this time.

## 2013-05-19 ENCOUNTER — Encounter: Payer: Self-pay | Admitting: Obstetrics and Gynecology

## 2013-05-26 ENCOUNTER — Emergency Department (HOSPITAL_COMMUNITY)
Admission: EM | Admit: 2013-05-26 | Discharge: 2013-05-26 | Disposition: A | Discharge status: Home / Self Care | Payer: Medicaid Other | Attending: Emergency Medicine | Admitting: Emergency Medicine

## 2013-05-26 ENCOUNTER — Encounter (HOSPITAL_COMMUNITY): Payer: Self-pay | Admitting: *Deleted

## 2013-05-26 ENCOUNTER — Emergency Department (HOSPITAL_COMMUNITY): Payer: Medicaid Other

## 2013-05-26 DIAGNOSIS — R1032 Left lower quadrant pain: Secondary | ICD-10-CM | POA: Insufficient documentation

## 2013-05-26 DIAGNOSIS — R350 Frequency of micturition: Secondary | ICD-10-CM | POA: Insufficient documentation

## 2013-05-26 DIAGNOSIS — F17200 Nicotine dependence, unspecified, uncomplicated: Secondary | ICD-10-CM | POA: Insufficient documentation

## 2013-05-26 DIAGNOSIS — R112 Nausea with vomiting, unspecified: Secondary | ICD-10-CM | POA: Insufficient documentation

## 2013-05-26 DIAGNOSIS — R109 Unspecified abdominal pain: Secondary | ICD-10-CM

## 2013-05-26 DIAGNOSIS — Z79899 Other long term (current) drug therapy: Secondary | ICD-10-CM | POA: Insufficient documentation

## 2013-05-26 DIAGNOSIS — Z87442 Personal history of urinary calculi: Secondary | ICD-10-CM | POA: Insufficient documentation

## 2013-05-26 DIAGNOSIS — J45909 Unspecified asthma, uncomplicated: Secondary | ICD-10-CM | POA: Insufficient documentation

## 2013-05-26 DIAGNOSIS — E119 Type 2 diabetes mellitus without complications: Secondary | ICD-10-CM | POA: Insufficient documentation

## 2013-05-26 DIAGNOSIS — Z8669 Personal history of other diseases of the nervous system and sense organs: Secondary | ICD-10-CM | POA: Insufficient documentation

## 2013-05-26 DIAGNOSIS — Z8742 Personal history of other diseases of the female genital tract: Secondary | ICD-10-CM | POA: Insufficient documentation

## 2013-05-26 DIAGNOSIS — Z9889 Other specified postprocedural states: Secondary | ICD-10-CM | POA: Insufficient documentation

## 2013-05-26 DIAGNOSIS — Z91040 Latex allergy status: Secondary | ICD-10-CM | POA: Insufficient documentation

## 2013-05-26 DIAGNOSIS — Z3202 Encounter for pregnancy test, result negative: Secondary | ICD-10-CM | POA: Insufficient documentation

## 2013-05-26 LAB — URINALYSIS, ROUTINE W REFLEX MICROSCOPIC
Bilirubin Urine: NEGATIVE
Ketones, ur: NEGATIVE mg/dL
Nitrite: NEGATIVE
Protein, ur: NEGATIVE mg/dL
Urobilinogen, UA: 0.2 mg/dL (ref 0.0–1.0)

## 2013-05-26 LAB — CBC WITH DIFFERENTIAL/PLATELET
Basophils Absolute: 0 10*3/uL (ref 0.0–0.1)
HCT: 37 % (ref 36.0–46.0)
Hemoglobin: 11.7 g/dL — ABNORMAL LOW (ref 12.0–15.0)
Lymphocytes Relative: 23 % (ref 12–46)
Monocytes Absolute: 0.7 10*3/uL (ref 0.1–1.0)
Neutro Abs: 7.1 10*3/uL (ref 1.7–7.7)
RDW: 14.8 % (ref 11.5–15.5)
WBC: 10.5 10*3/uL (ref 4.0–10.5)

## 2013-05-26 LAB — BASIC METABOLIC PANEL
Chloride: 102 mEq/L (ref 96–112)
Creatinine, Ser: 0.63 mg/dL (ref 0.50–1.10)
GFR calc Af Amer: >90 mL/min (ref >90)
Potassium: 3.7 mEq/L (ref 3.5–5.1)

## 2013-05-26 MED ORDER — IOHEXOL 300 MG/ML  SOLN: 50.0000 mL | Freq: Once | INTRAMUSCULAR | Status: DC | PRN

## 2013-05-26 MED ORDER — OXYCODONE-ACETAMINOPHEN 5-325 MG PO TABS
2.0000 | ORAL_TABLET | Freq: Once | ORAL | Status: AC
  Administered 2013-05-26: 2 via ORAL
  Filled 2013-05-26: qty 2

## 2013-05-26 NOTE — ED Notes (Signed)
PT states she is allergic to contrast/dye. EDP aware. Order changed to wo contrast.

## 2013-05-26 NOTE — ED Notes (Signed)
EDP in to re eval 

## 2013-05-26 NOTE — ED Notes (Signed)
Lab notified of specimen still needing to be drawn.Marland Kitchen

## 2013-05-26 NOTE — ED Notes (Addendum)
Pt states vomiting past three days, mainly x 1 a day (in the morning or at night). She states last night she ate a lot of hot dogs, significant other states, "I think she ate too many hot dogs. I've told her she needs to stop eating so much at one time." Pt states discomfort to LLQ. Neg pregnancy test. NAD. Last vomited last night x 1 after eating the hot dogs. Pt states she only ate two. Pts last normal bowel movement was this morning.

## 2013-05-26 NOTE — ED Provider Notes (Signed)
CSN: [id number]     Arrival date & time 05/26/13  1102 History    This chart was scribed for Joya Gaskins, MD by Blanchard Kelch, ED Scribe. The patient was seen in room APA19/APA19. Patient's care was started at 11:47 AM.    Chief Complaint  Patient presents with  . Abdominal Pain    Patient is a 22 y.o. female presenting with abdominal pain. The history is provided by the patient. No language interpreter was used.  Abdominal Pain Pain location:  LLQ Pain quality: sharp and stabbing   Pain radiation: left hip. Pain severity:  Severe Onset quality:  Sudden Timing:  Constant Progression:  Worsening Chronicity:  New Associated symptoms: nausea and vomiting   Associated symptoms: no chest pain, no dysuria, no fever, no shortness of breath and no vaginal bleeding   Risk factors: multiple surgeries     HPI Comments: Faith [name] is a 22 y.o. female with a history of kidney stones who presents to the Emergency Department complaining of sudden, worsening left lower abdominal pain that radiates to left hip and began two days ago. Patient describes pain as sharp and stabbing. She denies that pain feels similar to previous kidney stone pain. Patient complains of associated nausea, vomiting and urinary frequency. Patient denies fever, dysuria, chest pain or shortness of breath, abnormal vaginal bleeding, weakness or numbness in lower extremities. Patient has medical history of asthma, seizures, DM and tubal pregnancy. Patient has past surgical history of dilation and curettage of uterus, ovarian cyst removal, tonsillectomy and adenoidectomy.    Past Medical History  Diagnosis Date  . Asthma   . Seizures   . Diabetes mellitus without complication   . Tubal pregnancy    Past Surgical History  Procedure Laterality Date  . Dilation and curettage of uterus    . Ovarian cyst removed    . Tonsillectomy    . Tonsillectomy and adenoidectomy     No family history on file. History   Substance Use Topics  . Smoking status: Current Every Day Smoker    Types: Cigarettes  . Smokeless tobacco: Not on file  . Alcohol Use: Yes     Comment: occ   OB History   Grav Para Term Preterm Abortions TAB SAB Ect Mult Living                 Review of Systems  Constitutional: Negative for fever.  Respiratory: Negative for shortness of breath.   Cardiovascular: Negative for chest pain.  Gastrointestinal: Positive for nausea, vomiting and abdominal pain.  Genitourinary: Positive for frequency. Negative for dysuria and vaginal bleeding.  Neurological: Negative for weakness and numbness.  All other systems reviewed and are negative.    Allergies  Morphine and related; Sulfa antibiotics; Tomato; Toradol; Tramadol; and Latex  Home Medications   Current Outpatient Rx  Name  Route  Sig  Dispense  Refill  . albuterol (PROVENTIL HFA;VENTOLIN HFA) 108 (90 BASE) MCG/ACT inhaler   Inhalation   Inhale 2 puffs into the lungs every 6 (six) hours as needed. For shortness of breath         . clonazePAM (KLONOPIN) 1 MG tablet   Oral   Take 1 mg by mouth 2 (two) times daily.         Marland Kitchen oxyCODONE-acetaminophen (PERCOCET/ROXICET) 5-325 MG per tablet   Oral   Take 1 tablet by mouth every 4 (four) hours as needed for pain.   10 tablet   0  Triage Vitals: BP 116/57  Pulse 81  Temp(Src) 98.5 F (36.9 C) (Oral)  Resp 16  Ht 5' (1.524 m)  Wt 187 lb (84.823 kg)  BMI 36.52 kg/m2  SpO2 100%  LMP 04/18/2013  Physical Exam  CONSTITUTIONAL: Well developed/well nourished HEAD: Normocephalic/atraumatic EYES: EOMI/PERRL ENMT: Mucous membranes moist NECK: supple no meningeal signs SPINE:entire spine nontender CV: S1/S2 noted, no murmurs/rubs/gallops noted LUNGS: Lungs are clear to auscultation bilaterally, no apparent distress ABDOMEN: soft, mild tenderness to left lower quadrant, no rebound or guarding GU: left cva tenderness Vaginal discharge noted.  No vag bleeding.  Mild  left adnexal tenderness.  No adnexal mass NEURO: Pt is awake/alert, moves all extremitiesx4, no focal weakness in the lower extremities EXTREMITIES: pulses normal, full ROM SKIN: warm, color normal PSYCH: no abnormalities of mood noted   ED Course  DIAGNOSTIC STUDIES: Oxygen Saturation is 100% on room air, normal by my interpretation.    COORDINATION OF CARE:  11:50 PM - Patient verbalizes understanding and agrees with treatment plan.    Pt with recent pelvic ultrasound, don't feel this needs repeated and don't feel this is TOA/abscess Since she had flank pain that radiated into LLQ, CT imaging ordered This was negative Advised f/u with gynecologist as outpatient Stable findings noted per radiology when compared to previous studies   Procedures Labs Reviewed  URINALYSIS, ROUTINE W REFLEX MICROSCOPIC  POCT PREGNANCY, URINE     MDM  Nursing notes including past medical history and social history reviewed and considered in documentation Labs/vital reviewed and considered Narcotic database reviewed  I personally performed the services described in this documentation, which was scribed in my presence. The recorded information has been reviewed and is accurate.      Joya Gaskins, MD 05/26/13 1538

## 2013-05-26 NOTE — ED Notes (Signed)
Pelvic ready 

## 2013-05-26 NOTE — ED Notes (Signed)
LLQ pain x 2 days with n/v.

## 2013-05-27 LAB — GC/CHLAMYDIA PROBE AMP
CT Probe RNA: NEGATIVE
GC Probe RNA: NEGATIVE

## 2013-05-29 ENCOUNTER — Emergency Department (HOSPITAL_COMMUNITY): Payer: Medicaid Other

## 2013-05-29 ENCOUNTER — Emergency Department (HOSPITAL_COMMUNITY)
Admission: EM | Admit: 2013-05-29 | Discharge: 2013-05-29 | Disposition: A | Discharge status: Home / Self Care | Payer: Medicaid Other | Attending: Emergency Medicine | Admitting: Emergency Medicine

## 2013-05-29 ENCOUNTER — Encounter (HOSPITAL_COMMUNITY): Payer: Self-pay | Admitting: Emergency Medicine

## 2013-05-29 DIAGNOSIS — R569 Unspecified convulsions: Secondary | ICD-10-CM

## 2013-05-29 DIAGNOSIS — G40909 Epilepsy, unspecified, not intractable, without status epilepticus: Secondary | ICD-10-CM | POA: Insufficient documentation

## 2013-05-29 DIAGNOSIS — Z3202 Encounter for pregnancy test, result negative: Secondary | ICD-10-CM | POA: Insufficient documentation

## 2013-05-29 DIAGNOSIS — E119 Type 2 diabetes mellitus without complications: Secondary | ICD-10-CM | POA: Insufficient documentation

## 2013-05-29 DIAGNOSIS — Z91040 Latex allergy status: Secondary | ICD-10-CM | POA: Insufficient documentation

## 2013-05-29 DIAGNOSIS — F17200 Nicotine dependence, unspecified, uncomplicated: Secondary | ICD-10-CM | POA: Insufficient documentation

## 2013-05-29 DIAGNOSIS — Z87828 Personal history of other (healed) physical injury and trauma: Secondary | ICD-10-CM | POA: Insufficient documentation

## 2013-05-29 DIAGNOSIS — R51 Headache: Secondary | ICD-10-CM | POA: Insufficient documentation

## 2013-05-29 DIAGNOSIS — Z79899 Other long term (current) drug therapy: Secondary | ICD-10-CM | POA: Insufficient documentation

## 2013-05-29 LAB — CBC WITH DIFFERENTIAL/PLATELET
Basophils Relative: 0 % (ref 0–1)
HCT: 35.9 % — ABNORMAL LOW (ref 36.0–46.0)
Hemoglobin: 11.3 g/dL — ABNORMAL LOW (ref 12.0–15.0)
Lymphocytes Relative: 19 % (ref 12–46)
Lymphs Abs: 2.6 10*3/uL (ref 0.7–4.0)
MCHC: 31.5 g/dL (ref 30.0–36.0)
Monocytes Absolute: 0.7 10*3/uL (ref 0.1–1.0)
Monocytes Relative: 5 % (ref 3–12)
Neutro Abs: 10.3 10*3/uL — ABNORMAL HIGH (ref 1.7–7.7)
RBC: 4.8 MIL/uL (ref 3.87–5.11)

## 2013-05-29 LAB — BASIC METABOLIC PANEL
BUN: 8 mg/dL (ref 6–23)
CO2: 25 mEq/L (ref 19–32)
Chloride: 101 mEq/L (ref 96–112)
Creatinine, Ser: 0.63 mg/dL (ref 0.50–1.10)
Glucose, Bld: 120 mg/dL — ABNORMAL HIGH (ref 70–99)

## 2013-05-29 MED ORDER — HYDROCODONE-ACETAMINOPHEN 5-325 MG PO TABS: 1.0000 | ORAL_TABLET | Freq: Four times a day (QID) | ORAL | Status: DC | PRN
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
## 2013-05-29 NOTE — ED Notes (Signed)
Awake and requested bed pan.
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
## 2013-05-29 NOTE — ED Notes (Addendum)
Fiance in room and then left after speaking to patient.  Patient placed on the call light and told tech that someone found her pills - EMS was looking at her old bottle per patient.

## 2013-05-29 NOTE — ED Notes (Signed)
Patient denies taking all of the missing Clonazepam - states someone that lives at her house has taken them (no one named).   Reports her head was hurting before she had the seizure. Remembers she was sitting on the commode - no other recall of seizure.  No incontinence - requested bedpan immediately upon arrival to ED.  Voided about 150 +/- ml of clear urine.

## 2013-05-29 NOTE — ED Notes (Signed)
Note - oral airway rinsed off and instructions given to fiance in use of same.  He has been putting a spoon in her mouth when she has a seizure- educated on turning patient to side and let saliva drool from mouth.  Not necessary to place anything in her mouth, but if he feels he must, the oral airway would be less trauma for her teeth if she bites on it.  Repeat demo of process given by fiance x 2 with verbalized understanding

## 2013-05-29 NOTE — ED Notes (Signed)
Patient presents to ER via [facility] with c/o seizure.  Patient c/o 10/10 headache, which she had before the seizure.  Per EMS, patient fell in bathroom, no injuries noted.  Per EMS, patient had Clonzepam, quantity of 60, filled on 05/16/13; there were 2 pills noted in bottle at home.  Patient is A&O; skin w/d. Respirations even and unlabored.

## 2013-05-29 NOTE — ED Notes (Signed)
Lab in to draw bloodwork, patient c/o being hot then started looking around room, gagged as if nauseated - no emesis - sm amt of saliva.  Then patient fiance stated this is how her seizures begin.  Turned patient on her left side, sm amt saliva suctioned from mouth.  No movements noted other than patient moving small amt of saliva from her mouth.  Oral airway inserted and 02 started.  Patient is not responding.  MD notified.

## 2013-06-06 ENCOUNTER — Encounter: Payer: Self-pay | Admitting: Obstetrics and Gynecology

## 2013-06-13 ENCOUNTER — Encounter (HOSPITAL_COMMUNITY): Payer: Self-pay | Admitting: Emergency Medicine

## 2013-06-13 ENCOUNTER — Inpatient Hospital Stay (HOSPITAL_COMMUNITY)
Admission: EM | Admit: 2013-06-13 | Discharge: 2013-06-15 | DRG: 101 | Disposition: A | Discharge status: Other Acute Inpatient Hospital | Payer: Medicaid Other | Attending: Internal Medicine | Admitting: Internal Medicine

## 2013-06-13 DIAGNOSIS — E669 Obesity, unspecified: Secondary | ICD-10-CM | POA: Diagnosis present

## 2013-06-13 DIAGNOSIS — D649 Anemia, unspecified: Secondary | ICD-10-CM | POA: Diagnosis present

## 2013-06-13 DIAGNOSIS — G40901 Epilepsy, unspecified, not intractable, with status epilepticus: Secondary | ICD-10-CM | POA: Diagnosis present

## 2013-06-13 DIAGNOSIS — F3289 Other specified depressive episodes: Secondary | ICD-10-CM | POA: Diagnosis present

## 2013-06-13 DIAGNOSIS — F329 Major depressive disorder, single episode, unspecified: Secondary | ICD-10-CM | POA: Diagnosis present

## 2013-06-13 DIAGNOSIS — F17200 Nicotine dependence, unspecified, uncomplicated: Secondary | ICD-10-CM | POA: Diagnosis present

## 2013-06-13 DIAGNOSIS — Z91199 Patient's noncompliance with other medical treatment and regimen due to unspecified reason: Secondary | ICD-10-CM

## 2013-06-13 DIAGNOSIS — J45909 Unspecified asthma, uncomplicated: Secondary | ICD-10-CM | POA: Diagnosis present

## 2013-06-13 DIAGNOSIS — E1169 Type 2 diabetes mellitus with other specified complication: Secondary | ICD-10-CM | POA: Diagnosis present

## 2013-06-13 DIAGNOSIS — R45851 Suicidal ideations: Secondary | ICD-10-CM

## 2013-06-13 DIAGNOSIS — R51 Headache: Secondary | ICD-10-CM | POA: Diagnosis present

## 2013-06-13 DIAGNOSIS — E119 Type 2 diabetes mellitus without complications: Secondary | ICD-10-CM | POA: Diagnosis present

## 2013-06-13 DIAGNOSIS — I959 Hypotension, unspecified: Secondary | ICD-10-CM | POA: Diagnosis present

## 2013-06-13 DIAGNOSIS — F32A Depression, unspecified: Secondary | ICD-10-CM | POA: Diagnosis present

## 2013-06-13 DIAGNOSIS — G40401 Other generalized epilepsy and epileptic syndromes, not intractable, with status epilepticus: Principal | ICD-10-CM | POA: Diagnosis present

## 2013-06-13 DIAGNOSIS — Z9119 Patient's noncompliance with other medical treatment and regimen: Secondary | ICD-10-CM

## 2013-06-13 HISTORY — DX: Cardiac murmur, unspecified: R01.1

## 2013-06-13 LAB — BASIC METABOLIC PANEL
CO2: 24 mEq/L (ref 19–32)
GFR calc non Af Amer: >90 mL/min (ref >90)
Glucose, Bld: 106 mg/dL — ABNORMAL HIGH (ref 70–99)
Potassium: 3.6 mEq/L (ref 3.5–5.1)
Sodium: 138 mEq/L (ref 135–145)

## 2013-06-13 LAB — CBC WITH DIFFERENTIAL/PLATELET
Basophils Relative: 0 % (ref 0–1)
Eosinophils Relative: 2 % (ref 0–5)
Hemoglobin: 11.7 g/dL — ABNORMAL LOW (ref 12.0–15.0)
Lymphocytes Relative: 21 % (ref 12–46)
Neutrophils Relative %: 71 % (ref 43–77)
RBC: 4.94 MIL/uL (ref 3.87–5.11)
WBC: 12.1 10*3/uL — ABNORMAL HIGH (ref 4.0–10.5)

## 2013-06-13 LAB — PHENYTOIN LEVEL, TOTAL: Phenytoin Lvl: <2.5 ug/mL — ABNORMAL LOW (ref 10.0–20.0)

## 2013-06-13 LAB — VALPROIC ACID LEVEL: Valproic Acid Lvl: <10 ug/mL — ABNORMAL LOW (ref 50.0–100.0)

## 2013-06-13 MED ORDER — LEVETIRACETAM 500 MG/5ML IV SOLN
INTRAVENOUS | Status: AC
  Filled 2013-06-13: qty 5

## 2013-06-13 MED ORDER — LORAZEPAM 2 MG/ML IJ SOLN
1.0000 mg | Freq: Once | INTRAMUSCULAR | Status: AC
  Administered 2013-06-13: 1 mg via INTRAVENOUS
  Filled 2013-06-13: qty 1

## 2013-06-13 MED ORDER — ONDANSETRON HCL 4 MG/2ML IJ SOLN
4.0000 mg | Freq: Once | INTRAMUSCULAR | Status: AC
  Administered 2013-06-13: 4 mg via INTRAVENOUS
  Filled 2013-06-13: qty 2

## 2013-06-13 MED ORDER — SODIUM CHLORIDE 0.9 % IV SOLN
500.0000 mg | Freq: Once | INTRAVENOUS | Status: AC
  Administered 2013-06-13: 500 mg via INTRAVENOUS
  Filled 2013-06-13: qty 5

## 2013-06-13 NOTE — ED Notes (Signed)
Patient had another seizure-like episode. Patient began having frothy spit/sputum from mouth and curled back and clenched fists and turned side to side in bed. Patient would not respond to verbal. Dr. Hyacinth Meeker notified and came into room to reassess patient.

## 2013-06-13 NOTE — ED Provider Notes (Signed)
Scribed for Faith Roller, MD, the patient was seen in room APA04/APA04. This chart was scribed by Faith [name], ED scribe. Patient's care was started at 2206  CSN: [id number]     Arrival date & time 06/13/13  2152 History   First MD Initiated Contact with Patient 06/13/13 2159     Chief Complaint  Patient presents with  . Seizures   (Consider location/radiation/quality/duration/timing/severity/associated sxs/prior Treatment) The history is provided by the patient, the EMS personnel, medical records and a significant other.   HPI Comments: Faith [name] is a 22 y.o. female brought in by ambulance, who presents to the Emergency Department complaining of a seizure lasting 30 minutes onset PTA witnessed by her Faith [name]. Pt reports falling from a sitting position on the couch to ground. Reports associated constant moderate headache, and LOC. Reports she has seizures 2 to 3 times most days. Reports she is prescribed Keppra and Klonopin, but has not been taking the Keppra for past several weeks. Reports additionally takes Prozac for depression. Denies HI and SI at this time. Neurologist Dr. Lenis [name] in [place]  Reports she was admitted to the ICU in [place], [place] recently and transferred to a psych ward for suicidal ideations.  Past Medical History  Diagnosis Date  . Asthma   . Seizures   . Diabetes mellitus without complication   . Tubal pregnancy    Past Surgical History  Procedure Laterality Date  . Dilation and curettage of uterus    . Ovarian cyst removed    . Tonsillectomy    . Tonsillectomy and adenoidectomy     History reviewed. No pertinent family history. History  Substance Use Topics  . Smoking status: Current Every Day Smoker    Types: Cigarettes  . Smokeless tobacco: Not on file  . Alcohol Use: Yes     Comment: occ   OB History   Grav Para Term Preterm Abortions TAB SAB Ect Mult Living                 Review of Systems  Neurological: Positive for  seizures.  All other systems reviewed and are negative.  A complete 10 system review of systems was obtained and all systems are negative except as noted in the HPI and PMH.     Allergies  Morphine and related; Iohexol; Sulfa antibiotics; Tomato; Toradol; Tramadol; and Latex  Home Medications   Current Outpatient Rx  Name  Route  Sig  Dispense  Refill  . acetaminophen (TYLENOL) 500 MG tablet   Oral   Take 500-1,000 mg by mouth daily as needed for pain.         Marland Kitchen albuterol (PROVENTIL HFA;VENTOLIN HFA) 108 (90 BASE) MCG/ACT inhaler   Inhalation   Inhale 2 puffs into the lungs every 6 (six) hours as needed. For shortness of breath         . clonazePAM (KLONOPIN) 1 MG tablet   Oral   Take 1 mg by mouth 2 (two) times daily.         Marland Kitchen FLUoxetine (PROZAC) 20 MG tablet   Oral   Take 20 mg by mouth daily.         Marland Kitchen HYDROcodone-acetaminophen (NORCO/VICODIN) 5-325 MG per tablet   Oral   Take 1 tablet by mouth every 6 (six) hours as needed for pain.   10 tablet   0   . Multiple Vitamin (MULTIVITAMIN WITH MINERALS) TABS tablet   Oral   Take 1 tablet by mouth daily.         Marland Kitchen  Oxycodone-Acetaminophen (PERCOCET PO)   Oral   Take 1 tablet by mouth as needed (for pain).         . Vitamin D, Ergocalciferol, (DRISDOL) 50000 UNITS CAPS capsule   Oral   Take 50,000 Units by mouth every 7 (seven) days.          BP 109/62  Pulse 88  Temp(Src) 98.3 F (36.8 C) (Oral)  Resp 23  Ht 5' (1.524 m)  Wt 183 lb (83.008 kg)  BMI 35.74 kg/m2  SpO2 96%  LMP 06/10/2013 Physical Exam  Nursing note and vitals reviewed. Constitutional: She is oriented to person, place, and time. She appears well-developed and well-nourished.  HENT:  Head: Normocephalic and atraumatic.  Small superficial bite marks to bilateral tongue no facial tenderness, deformity, malocclusion or hemotympanum.  no battle's sign or racoon eyes.   Eyes: Conjunctivae and EOM are normal.  Neck: Neck supple. No  tracheal deviation present.  Cardiovascular: Normal rate.   Pulmonary/Chest: Effort normal. No respiratory distress.  Musculoskeletal: Normal range of motion.  Neurological: She is alert and oriented to person, place, and time.  Somnolent but arousable - speaks and answers short questions with short sentences.  MAEX4, CN 3-12 intact - then has seizure - post ictal with confusion, increased muscle tone  Skin: Skin is warm and dry.  Psychiatric: She has a normal mood and affect. Her behavior is normal.    ED Course  Procedures (including critical care time) Medications  LORazepam (ATIVAN) injection 1 mg (1 mg Intravenous Given 06/13/13 2229)  levETIRAcetam (KEPPRA) 500 mg in sodium chloride 0.9 % 100 mL IVPB (0 mg Intravenous Stopped 06/13/13 2327)  ondansetron (ZOFRAN) injection 4 mg (4 mg Intravenous Given 06/13/13 2350)    Labs Review Labs Reviewed  PHENYTOIN LEVEL, TOTAL - Abnormal; Notable for the following:    Phenytoin Lvl <2.5 (*)    All other components within normal limits  VALPROIC ACID LEVEL - Abnormal; Notable for the following:    Valproic Acid Lvl <10.0 (*)    All other components within normal limits  CBC WITH DIFFERENTIAL - Abnormal; Notable for the following:    WBC 12.1 (*)    Hemoglobin 11.7 (*)    MCV 74.5 (*)    MCH 23.7 (*)    Neutro Abs 8.7 (*)    All other components within normal limits  BASIC METABOLIC PANEL - Abnormal; Notable for the following:    Glucose, Bld 106 (*)    All other components within normal limits   Imaging Review No results found.  MDM   1. Status epilepticus    The patient has a history of frequent seizures, the boyfriend has now arrived and admits that she does have frequent seizures but not every day. She had a seizure earlier this morning, a significant seizure lasting 30 minutes this evening and since arrival she has had 2 separate seizures without a return to her baseline mental status. I personally witnessed seizure the seizures  for a portion, she did have tongue biting actively involved in for seizure, there was no corneal reflex during the second seizure. She does froth at the mouth and become stiff during the seizure-like activity. She has been given 1 mg of Ativan IV but continues to have intermittent seizures. She readily admits that she does not take her Keppra, boyfriend endorses her medication noncompliance, prior medical record also endorses her medication noncompliance including her last visit to a half weeks ago at which time she admitted  to taking Keppra for weeks. She states during her briefly lucid intervals that she is supposed to see neurology at [facility] in the near future.  The patient has had another seizure, technically this is status epilepticus, she has been given medications including Keppra and Ativan it appears to be postictal but improving. Due to her frequency of seizures I have requested that the hospitalist admit her to the intensive care unit, he is in agreement, critical care provided for this patient continues to have seizures. There is no EEG documented on file to suggest that this is a pseudoseizure despite her underlying psychiatric illnesses as well.  CRITICAL CARE Performed by: Faith [name] Total critical care time: 35 Critical care time was exclusive of separately billable procedures and treating other patients. Critical care was necessary to treat or prevent imminent or life-threatening deterioration. Critical care was time spent personally by me on the following activities: development of treatment plan with patient and/or surrogate as well as nursing, discussions with consultants, evaluation of patient's response to treatment, examination of patient, obtaining history from patient or surrogate, ordering and performing treatments and interventions, ordering and review of laboratory studies, ordering and review of radiographic studies, pulse oximetry and re-evaluation of patient's  condition.   I personally performed the services described in this documentation, which was scribed in my presence. The recorded information has been reviewed and is accurate.     Faith Roller, MD 06/13/13 2352

## 2013-06-13 NOTE — ED Notes (Signed)
Patient's boyfriend called EMS in reference to seizures. Boyfriend stated patient seized for approximately 30 minutes and he inserted an oral airway. EMS states patient was postictal on arrival. Patient complains of headache and blurred vision.

## 2013-06-13 NOTE — ED Notes (Signed)
Patient reported is out of Keppra prescription. States first day she missed taking Keppra was yesterday.

## 2013-06-13 NOTE — ED Notes (Signed)
While preparing to administer 1 mg of ativan IV, patient closed eyes and began not responding to verbal. Patient fists clenched, and patient foaming at the mouth. Dr. Hyacinth Meeker notified and in room to assess patient and witness seizure activity. Ativan 1 mg IV administered.

## 2013-06-14 ENCOUNTER — Inpatient Hospital Stay (HOSPITAL_COMMUNITY)
Admit: 2013-06-14 | Discharge: 2013-06-14 | Disposition: A | Discharge status: Home / Self Care | Payer: Medicaid Other | Attending: Neurology | Admitting: Neurology

## 2013-06-14 ENCOUNTER — Encounter (HOSPITAL_COMMUNITY): Payer: Self-pay | Admitting: *Deleted

## 2013-06-14 DIAGNOSIS — E669 Obesity, unspecified: Secondary | ICD-10-CM | POA: Diagnosis present

## 2013-06-14 DIAGNOSIS — G40901 Epilepsy, unspecified, not intractable, with status epilepticus: Secondary | ICD-10-CM | POA: Diagnosis present

## 2013-06-14 DIAGNOSIS — E1169 Type 2 diabetes mellitus with other specified complication: Secondary | ICD-10-CM | POA: Diagnosis present

## 2013-06-14 LAB — GLUCOSE, CAPILLARY
Glucose-Capillary: 96 mg/dL (ref 70–99)
Glucose-Capillary: 89 mg/dL (ref 70–99)

## 2013-06-14 LAB — COMPREHENSIVE METABOLIC PANEL
BUN: 9 mg/dL (ref 6–23)
CO2: 24 mEq/L (ref 19–32)
Calcium: 8.7 mg/dL (ref 8.4–10.5)
Chloride: 108 mEq/L (ref 96–112)
Creatinine, Ser: 0.72 mg/dL (ref 0.50–1.10)
GFR calc non Af Amer: >90 mL/min (ref >90)
Total Bilirubin: <0.1 mg/dL — ABNORMAL LOW (ref 0.3–1.2)

## 2013-06-14 LAB — CBC
HCT: 35.3 % — ABNORMAL LOW (ref 36.0–46.0)
Hemoglobin: 11 g/dL — ABNORMAL LOW (ref 12.0–15.0)
MCH: 23.4 pg — ABNORMAL LOW (ref 26.0–34.0)
MCHC: 31.2 g/dL (ref 30.0–36.0)
RDW: 15 % (ref 11.5–15.5)

## 2013-06-14 LAB — TSH: TSH: 2.111 u[IU]/mL (ref 0.350–4.500)

## 2013-06-14 LAB — MRSA PCR SCREENING: MRSA by PCR: NEGATIVE

## 2013-06-14 LAB — HEMOGLOBIN A1C: Hgb A1c MFr Bld: 5.2 % (ref <5.7)

## 2013-06-14 MED ORDER — INSULIN ASPART 100 UNIT/ML ~~LOC~~ SOLN
0.0000 [IU] | Freq: Every day | SUBCUTANEOUS | Status: DC
Start: 2013-06-14 — End: 2013-06-15

## 2013-06-14 MED ORDER — FOLIC ACID 1 MG PO TABS
1.0000 mg | ORAL_TABLET | Freq: Every day | ORAL | Status: DC
  Administered 2013-06-14 – 2013-06-15 (×2): 1 mg via ORAL
  Filled 2013-06-14 (×2): qty 1

## 2013-06-14 MED ORDER — POLYETHYLENE GLYCOL 3350 17 G PO PACK: 17.0000 g | PACK | Freq: Every day | ORAL | Status: DC | PRN

## 2013-06-14 MED ORDER — SODIUM CHLORIDE 0.9 % IV SOLN
500.0000 mg | Freq: Two times a day (BID) | INTRAVENOUS | Status: DC
  Filled 2013-06-14: qty 5

## 2013-06-14 MED ORDER — ACETAMINOPHEN 325 MG PO TABS
650.0000 mg | ORAL_TABLET | ORAL | Status: DC | PRN
  Administered 2013-06-15: 650 mg via ORAL
  Filled 2013-06-14 (×3): qty 2

## 2013-06-14 MED ORDER — SODIUM CHLORIDE 0.9 % IV SOLN
1000.0000 mg | Freq: Two times a day (BID) | INTRAVENOUS | Status: DC
  Administered 2013-06-14 – 2013-06-15 (×2): 1000 mg via INTRAVENOUS
  Filled 2013-06-14 (×2): qty 10

## 2013-06-14 MED ORDER — BISACODYL 10 MG RE SUPP: 10.0000 mg | Freq: Every day | RECTAL | Status: DC | PRN

## 2013-06-14 MED ORDER — CLONAZEPAM 0.5 MG PO TABS
1.0000 mg | ORAL_TABLET | Freq: Every day | ORAL | Status: DC
Start: 2013-06-14 — End: 2013-06-15
  Administered 2013-06-14 (×2): 1 mg via ORAL
  Filled 2013-06-14 (×2): qty 2

## 2013-06-14 MED ORDER — POTASSIUM CHLORIDE IN NACL 20-0.9 MEQ/L-% IV SOLN
INTRAVENOUS | Status: DC
  Administered 2013-06-14 – 2013-06-15 (×4): via INTRAVENOUS

## 2013-06-14 MED ORDER — FLEET ENEMA 7-19 GM/118ML RE ENEM: 1.0000 | ENEMA | Freq: Once | RECTAL | Status: AC | PRN

## 2013-06-14 MED ORDER — ONDANSETRON HCL 4 MG/2ML IJ SOLN
4.0000 mg | INTRAMUSCULAR | Status: DC | PRN
  Administered 2013-06-14 – 2013-06-15 (×3): 4 mg via INTRAVENOUS
  Filled 2013-06-14 (×3): qty 2

## 2013-06-14 MED ORDER — FLUOXETINE HCL 20 MG PO CAPS
20.0000 mg | ORAL_CAPSULE | Freq: Every day | ORAL | Status: DC
  Administered 2013-06-14 – 2013-06-15 (×2): 20 mg via ORAL
  Filled 2013-06-14 (×2): qty 1

## 2013-06-14 MED ORDER — FLUOXETINE HCL 20 MG PO TABS
20.0000 mg | ORAL_TABLET | Freq: Every day | ORAL | Status: DC
  Filled 2013-06-14: qty 1

## 2013-06-14 MED ORDER — INSULIN ASPART 100 UNIT/ML ~~LOC~~ SOLN: 0.0000 [IU] | Freq: Three times a day (TID) | SUBCUTANEOUS | Status: DC

## 2013-06-14 MED ORDER — ENOXAPARIN SODIUM 40 MG/0.4ML ~~LOC~~ SOLN
40.0000 mg | SUBCUTANEOUS | Status: DC
  Administered 2013-06-14 – 2013-06-15 (×2): 40 mg via SUBCUTANEOUS
  Filled 2013-06-14 (×3): qty 0.4

## 2013-06-14 MED ORDER — LORAZEPAM 2 MG/ML IJ SOLN
1.0000 mg | INTRAMUSCULAR | Status: DC | PRN
  Administered 2013-06-14 – 2013-06-15 (×4): 1 mg via INTRAVENOUS
  Filled 2013-06-14 (×4): qty 1

## 2013-06-14 MED ORDER — ONDANSETRON HCL 4 MG/2ML IJ SOLN: 4.0000 mg | Freq: Three times a day (TID) | INTRAMUSCULAR | Status: DC | PRN

## 2013-06-14 NOTE — H&P (Signed)
[facility] History and Physical  Shatasia Cutshaw  ZOX:[id number]  DOB: April 27, 1991   DOA: 06/23/2013   This history is compromised because patient says she sometimes says the wrong words without realizing  PCP:   [facility] dept Neurologist: Dr. Lenis Noon in [place]  Chief Complaint:  Recurrent seizures  HPI: Faith [name] is a 22 y.o. female.  Obese young Caucasian lady with a history of seizure disorder since age 61, was reportedly placed on Keppra about age 14, but does not take it because she doesn't like the way it makes her feel. She reports that she was given Klonopin as a substitute, but continues to have regular seizures maybe 3 times per day.   Was brought in by EMS in post ictal state after a witnessed seizure, and had 3 further seizures in the emergency room associated with tongue biting and postictal drowsiness. Patient was given 500 mg of care per intravenously;  Hospitalist service was called.  Patient had further seizure after receiving Keppra  Sometimes she knows when they're coming on sometimes not. She reports she has a driving license despite a known seizure and will pull over to the side of the road when she feels a seizure coming on.  Reports she was admitted to the ICU in [place], [place] recently and transferred to a psych ward for suicidal ideations.  Reports Dr. Lenis Noon is trying to find her new neurologist as he is retiring.  Takes metformin for diabetes; reports a heart murmur.  Rewiew of Systems:   All systems negative except as marked bold or noted in the HPI;  Constitutional:    malaise, fever and chills. ;  Eyes:   eye pain, redness and discharge. ;  ENMT:   ear pain, hoarseness, nasal congestion, sinus pressure and sore throat. ;  Cardiovascular:    chest pain, palpitations, diaphoresis, dyspnea and peripheral edema.  Respiratory:   cough, hemoptysis, wheezing and stridor. ;  Gastrointestinal:  nausea, vomiting, diarrhea, constipation, abdominal  pain, melena, blood in stool, hematemesis, jaundice and rectal bleeding. unusual weight loss..   Genitourinary:    frequency, dysuria, incontinence,flank pain and hematuria; Musculoskeletal:   back pain and neck pain.  swelling and trauma.;  Skin: .  pruritus, rash, abrasions, bruising and skin lesion.; ulcerations Neuro:    headache, lightheadedness and neck stiffness.  weakness, altered level of consciousness, altered mental status, extremity weakness, burning feet, involuntary movement, seizure and syncope.    Past Medical History  Diagnosis Date  . Asthma   . Seizures   . Diabetes mellitus without complication   . Tubal pregnancy   . Heart murmur     Past Surgical History  Procedure Laterality Date  . Dilation and curettage of uterus    . Ovarian cyst removed    . Tonsillectomy    . Tonsillectomy and adenoidectomy      Medications:  HOME MEDS: Prior to Admission medications   Medication Sig Start Date End Date Taking? Authorizing Provider  acetaminophen (TYLENOL) 500 MG tablet Take 500-1,000 mg by mouth daily as needed for pain.    Historical Provider, MD  albuterol (PROVENTIL HFA;VENTOLIN HFA) 108 (90 BASE) MCG/ACT inhaler Inhale 2 puffs into the lungs every 6 (six) hours as needed. For shortness of breath    Historical Provider, MD  clonazePAM (KLONOPIN) 1 MG tablet Take 1 mg by mouth 2 (two) times daily.    Historical Provider, MD  FLUoxetine (PROZAC) 20 MG tablet Take 20 mg by mouth daily.  Historical Provider, MD  HYDROcodone-acetaminophen (NORCO/VICODIN) 5-325 MG per tablet Take 1 tablet by mouth every 6 (six) hours as needed for pain. 05/29/13   Benny Lennert, MD  Multiple Vitamin (MULTIVITAMIN WITH MINERALS) TABS tablet Take 1 tablet by mouth daily.    Historical Provider, MD  Oxycodone-Acetaminophen (PERCOCET PO) Take 1 tablet by mouth as needed (for pain).    Historical Provider, MD  Vitamin D, Ergocalciferol, (DRISDOL) 50000 UNITS CAPS capsule Take 50,000 Units  by mouth every 7 (seven) days.    Historical Provider, MD     Allergies:  Allergies  Allergen Reactions  . Morphine And Related Other (See Comments)    bradycardia  . Iohexol     Pt. States she can not take iohexol but won't state reaction type.  . Sulfa Antibiotics   . Tomato   . Toradol [Ketorolac Tromethamine]   . Tramadol   . Latex Rash    Social History:   reports that she has been smoking Cigarettes.  She has been smoking about 0.00 packs per day. She does not have any smokeless tobacco history on file. She reports that  drinks alcohol. She reports that she does not use illicit drugs.  Family History: History reviewed. No pertinent family history.   Physical Exam: Filed Vitals:   06/14/13 0200 06/14/13 0300 06/14/13 0400 06/14/13 0500  BP: 103/42 104/58 88/53 92/47   Pulse:  94 67 72  Temp:   98.2 F (36.8 C)   TempSrc:   Oral   Resp: 22 20 20 20   Height:      Weight:      SpO2:  100% 98% 100%   Blood pressure 92/47, pulse 72, temperature 98.2 F (36.8 C), temperature source Oral, resp. rate 20, height 5' (1.524 m), weight 95.7 kg (210 lb 15.7 oz), last menstrual period 06/10/2013, SpO2 100.00%. Body mass index is 41.2 kg/(m^2).   GEN:  Pleasant obese young Caucasian lady lying bed in no acute distress; strange affect; sometimes gives inappropriate answers to questions; cooperative with exam PSYCH:  alert and oriented x4;  neither depressed; affect is flat HEENT: Mucous membranes pink and anicteric; PERRLA; EOM intact; no cervical lymphadenopathy nor thyromegaly or carotid bruit; no JVD; Breasts:: Not examined CHEST WALL: No tenderness CHEST: Normal respiration, clear to auscultation bilaterally HEART: Regular rate and rhythm; no murmurs rubs or gallops BACK: No kyphosis no scoliosis; no CVA tenderness ABDOMEN: Obese, soft non-tender; no masses, no organomegaly, normal abdominal bowel sounds;  no intertriginous candida. Rectal Exam: Not  done EXTREMITIES:age-appropriate arthropathy of the hands and knees; no edema; no ulcerations. Genitalia: not examined PULSES: 2+ and symmetric SKIN: Normal hydration no rash or ulceration CNS: Cranial nerves 2-12 grossly intact no focal lateralizing neurologic deficit   Labs on Admission:  Basic Metabolic Panel:  Recent Labs Lab 06/13/13 2248  NA 138  K 3.6  CL 104  CO2 24  GLUCOSE 106*  BUN 10  CREATININE 0.72  CALCIUM 8.9   Liver Function Tests: No results found for this basename: AST, ALT, ALKPHOS, BILITOT, PROT, ALBUMIN,  in the last 168 hours No results found for this basename: LIPASE, AMYLASE,  in the last 168 hours No results found for this basename: AMMONIA,  in the last 168 hours CBC:  Recent Labs Lab 06/13/13 2248  WBC 12.1*  NEUTROABS 8.7*  HGB 11.7*  HCT 36.8  MCV 74.5*  PLT 366   Cardiac Enzymes: No results found for this basename: CKTOTAL, CKMB, CKMBINDEX, TROPONINI,  in  the last 168 hours BNP: No components found with this basename: POCBNP,  D-dimer: No components found with this basename: D-DIMER,  CBG:  Recent Labs Lab 06/14/13 0212  GLUCAP 96    Radiological Exams on Admission: No results found.     Assessment/Plan   Active Problems:   Status epilepticus   Diabetes mellitus type 2 in obese  anemia Obesity Questionable bipolar depression  PLAN:    Other plans as per orders.  Code Status: Full code Family Communication: No family at bedside Disposition Plan: Depending on response to therapy   Lindi Abram Nocturnist [facility] Pager [phone]   06/14/2013, 5:33 AM

## 2013-06-14 NOTE — Progress Notes (Signed)
Patient had seizure-like activity lasting approx 1-2 min. Patients arms tensed up, head turned toward side, was biting down on tongue and slight foaming from mouth. HR increased to 110s and RR to 30. Gave patient ativan 1mg  IV as prescribedt, MD notified, patient drowsy at this time, still nonverbal but will look at you and nod appropriately. Suction and oxygen in use at bedside.
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
## 2013-06-14 NOTE — Progress Notes (Signed)
[facility] PROGRESS NOTE  Faith [name] ZOX:[id number] DOB: 1991-04-09 DOA: 06/13/2013 PCP: No primary provider on file.  Assessment/Plan: 1. Status epilepticus.  Appears to have resolved.  Neurology following.  Patient has known seizure disorder.  She has been non compliant with medications.  EEG has been ordered.  Keppra has been increased to 1000mg  bid. Will continue to follow 2. Hypotension, unclear etiology.  ?related to meds.  Will continue IV fluids and follow.  Check cortisol. 3. Diabetes.  Continue sliding scale insulin  Code Status: full code Family Communication: no family present Disposition Plan: discharge home once improved   Consultants:  neurology  Procedures:  none  Antibiotics:  none  HPI/Subjective: Complains of headache.  Events overnight noted.  Objective: Filed Vitals:   06/14/13 1000  BP: 88/47  Pulse: 77  Temp:   Resp: 16    Intake/Output Summary (Last 24 hours) at 06/14/13 1109 Last data filed at 06/14/13 1000  Gross per 24 hour  Intake 1348.75 ml  Output    201 ml  Net 1147.75 ml   Filed Weights   06/13/13 2159 06/14/13 0100  Weight: 83.008 kg (183 lb) 95.7 kg (210 lb 15.7 oz)    Exam:   General:  NAD  Cardiovascular: S1, S2 RRR  Respiratory: CTA B  Abdomen: Soft, nt, obese, bs+  Musculoskeletal: no edema b/l   Data Reviewed: Basic Metabolic Panel:  Recent Labs Lab 06/13/13 2248 06/14/13 0521  NA 138 141  K 3.6 3.8  CL 104 108  CO2 24 24  GLUCOSE 106* 102*  BUN 10 9  CREATININE 0.72 0.72  CALCIUM 8.9 8.7   Liver Function Tests:  Recent Labs Lab 06/14/13 0521  AST 10  ALT 12  ALKPHOS 65  BILITOT <0.1*  PROT 6.6  ALBUMIN 3.3*   No results found for this basename: LIPASE, AMYLASE,  in the last 168 hours No results found for this basename: AMMONIA,  in the last 168 hours CBC:  Recent Labs Lab 06/13/13 2248 06/14/13 0521  WBC 12.1* 9.0  NEUTROABS 8.7*  --   HGB 11.7* 11.0*  HCT 36.8 35.3*   MCV 74.5* 74.9*  PLT 366 352   Cardiac Enzymes: No results found for this basename: CKTOTAL, CKMB, CKMBINDEX, TROPONINI,  in the last 168 hours BNP (last 3 results) No results found for this basename: PROBNP,  in the last 8760 hours CBG:  Recent Labs Lab 06/14/13 0212 06/14/13 0729  GLUCAP 96 92    Recent Results (from the past 240 hour(s))  MRSA PCR SCREENING     Status: None   Collection Time    06/14/13 12:51 AM      Result Value Range Status   MRSA by PCR NEGATIVE  NEGATIVE Final   Comment:            The GeneXpert MRSA Assay (FDA     approved for NASAL specimens     only), is one component of a     comprehensive MRSA colonization     surveillance program. It is not     intended to diagnose MRSA     infection nor to guide or     monitor treatment for     MRSA infections.     Studies: No results found.  Scheduled Meds: . clonazePAM  1 mg Oral QHS  . enoxaparin (LOVENOX) injection  40 mg Subcutaneous Q24H  . FLUoxetine  20 mg Oral Daily  . folic acid  1 mg Oral Daily  .  insulin aspart  0-5 Units Subcutaneous QHS  . insulin aspart  0-9 Units Subcutaneous TID WC  . levETIRAcetam  1,000 mg Intravenous Q12H   Continuous Infusions: . 0.9 % NaCl with KCl 20 mEq / L 75 mL/hr at 06/14/13 1000    Active Problems:   Status epilepticus   Diabetes mellitus type 2 in obese    Time spent:    [facility]  [facility] Pager [phone]. If 7PM-7AM, please contact night-coverage at [email or web address], password [facility] 06/14/2013, 11:09 AM  LOS: 1 day

## 2013-06-14 NOTE — Progress Notes (Signed)
Called into patients room via call bell; upon entering room patient had odd expression on face and stated that she felt funny like she was going to have a seizure, gave her 1mg  ativan IV, patient got tense esp her arms, head turned toward side and foamed at mouth, patient then had episode of stillness; patient slowly starting to arouse; MD notified.

## 2013-06-14 NOTE — Progress Notes (Signed)
EEG IN PROGRESS

## 2013-06-14 NOTE — Progress Notes (Signed)
EEG Completed; Results Pending  

## 2013-06-14 NOTE — Progress Notes (Signed)
UR Chart Review Completed  

## 2013-06-15 ENCOUNTER — Encounter (HOSPITAL_COMMUNITY): Payer: Self-pay

## 2013-06-15 ENCOUNTER — Inpatient Hospital Stay (HOSPITAL_COMMUNITY)
Admission: AD | Admit: 2013-06-15 | Discharge: 2013-06-19 | DRG: 885 | Disposition: A | Discharge status: Home / Self Care | Payer: Medicaid Other | Source: Intra-hospital | Attending: Psychiatry | Admitting: Psychiatry

## 2013-06-15 DIAGNOSIS — F411 Generalized anxiety disorder: Secondary | ICD-10-CM | POA: Diagnosis present

## 2013-06-15 DIAGNOSIS — R45851 Suicidal ideations: Secondary | ICD-10-CM

## 2013-06-15 DIAGNOSIS — E1169 Type 2 diabetes mellitus with other specified complication: Secondary | ICD-10-CM

## 2013-06-15 DIAGNOSIS — F332 Major depressive disorder, recurrent severe without psychotic features: Principal | ICD-10-CM | POA: Diagnosis present

## 2013-06-15 DIAGNOSIS — F329 Major depressive disorder, single episode, unspecified: Secondary | ICD-10-CM

## 2013-06-15 DIAGNOSIS — R45850 Homicidal ideations: Secondary | ICD-10-CM

## 2013-06-15 DIAGNOSIS — Z5987 Material hardship due to limited financial resources, not elsewhere classified: Secondary | ICD-10-CM

## 2013-06-15 DIAGNOSIS — G40901 Epilepsy, unspecified, not intractable, with status epilepticus: Secondary | ICD-10-CM

## 2013-06-15 DIAGNOSIS — Z598 Other problems related to housing and economic circumstances: Secondary | ICD-10-CM

## 2013-06-15 DIAGNOSIS — F4310 Post-traumatic stress disorder, unspecified: Secondary | ICD-10-CM | POA: Diagnosis present

## 2013-06-15 DIAGNOSIS — G40909 Epilepsy, unspecified, not intractable, without status epilepticus: Secondary | ICD-10-CM | POA: Diagnosis present

## 2013-06-15 DIAGNOSIS — F32A Depression, unspecified: Secondary | ICD-10-CM | POA: Diagnosis present

## 2013-06-15 DIAGNOSIS — J45909 Unspecified asthma, uncomplicated: Secondary | ICD-10-CM | POA: Diagnosis present

## 2013-06-15 DIAGNOSIS — F17200 Nicotine dependence, unspecified, uncomplicated: Secondary | ICD-10-CM | POA: Diagnosis present

## 2013-06-15 DIAGNOSIS — Z79899 Other long term (current) drug therapy: Secondary | ICD-10-CM

## 2013-06-15 DIAGNOSIS — E119 Type 2 diabetes mellitus without complications: Secondary | ICD-10-CM | POA: Diagnosis present

## 2013-06-15 DIAGNOSIS — G4710 Hypersomnia, unspecified: Secondary | ICD-10-CM | POA: Diagnosis present

## 2013-06-15 HISTORY — DX: Suicidal ideations: R45.851

## 2013-06-15 LAB — CBC
HCT: 34.9 % — ABNORMAL LOW (ref 36.0–46.0)
Hemoglobin: 10.7 g/dL — ABNORMAL LOW (ref 12.0–15.0)
MCV: 75.4 fL — ABNORMAL LOW (ref 78.0–100.0)
WBC: 9.6 10*3/uL (ref 4.0–10.5)

## 2013-06-15 LAB — BASIC METABOLIC PANEL
BUN: 6 mg/dL (ref 6–23)
Chloride: 107 mEq/L (ref 96–112)
Glucose, Bld: 87 mg/dL (ref 70–99)
Potassium: 4.2 mEq/L (ref 3.5–5.1)

## 2013-06-15 LAB — GLUCOSE, CAPILLARY
Glucose-Capillary: 85 mg/dL (ref 70–99)
Glucose-Capillary: 91 mg/dL (ref 70–99)
Glucose-Capillary: 98 mg/dL (ref 70–99)

## 2013-06-15 MED ORDER — IBUPROFEN 800 MG PO TABS: 800.0000 mg | ORAL_TABLET | Freq: Three times a day (TID) | ORAL | Status: DC | PRN

## 2013-06-15 MED ORDER — VITAMIN D (ERGOCALCIFEROL) 1.25 MG (50000 UNIT) PO CAPS: 50000.0000 [IU] | ORAL_CAPSULE | ORAL | Status: DC

## 2013-06-15 MED ORDER — ADULT MULTIVITAMIN W/MINERALS CH
1.0000 | ORAL_TABLET | Freq: Every day | ORAL | Status: DC
  Administered 2013-06-16 – 2013-06-19 (×4): 1 via ORAL
  Filled 2013-06-15 (×5): qty 1

## 2013-06-15 MED ORDER — FLUOXETINE HCL 20 MG PO CAPS: 20.0000 mg | ORAL_CAPSULE | Freq: Every day | ORAL | Status: DC

## 2013-06-15 MED ORDER — LEVETIRACETAM 1000 MG PO TABS: 1000.0000 mg | ORAL_TABLET | Freq: Two times a day (BID) | ORAL | Status: DC

## 2013-06-15 MED ORDER — CLONAZEPAM 1 MG PO TABS
1.0000 mg | ORAL_TABLET | Freq: Two times a day (BID) | ORAL | Status: DC
  Administered 2013-06-15 – 2013-06-19 (×8): 1 mg via ORAL
  Filled 2013-06-15 (×8): qty 1

## 2013-06-15 MED ORDER — MAGNESIUM HYDROXIDE 400 MG/5ML PO SUSP: 30.0000 mL | Freq: Every day | ORAL | Status: DC | PRN

## 2013-06-15 MED ORDER — LEVETIRACETAM 500 MG PO TABS
1000.0000 mg | ORAL_TABLET | Freq: Two times a day (BID) | ORAL | Status: DC
  Administered 2013-06-15 – 2013-06-19 (×8): 1000 mg via ORAL
  Filled 2013-06-15 (×2): qty 2
  Filled 2013-06-15: qty 4
  Filled 2013-06-15 (×8): qty 2

## 2013-06-15 MED ORDER — LEVETIRACETAM 500 MG PO TABS: 1000.0000 mg | ORAL_TABLET | Freq: Two times a day (BID) | ORAL | Status: DC

## 2013-06-15 MED ORDER — ALBUTEROL SULFATE HFA 108 (90 BASE) MCG/ACT IN AERS: 2.0000 | INHALATION_SPRAY | Freq: Four times a day (QID) | RESPIRATORY_TRACT | Status: DC | PRN

## 2013-06-15 MED ORDER — ALUM & MAG HYDROXIDE-SIMETH 200-200-20 MG/5ML PO SUSP
30.0000 mL | ORAL | Status: DC | PRN
  Administered 2013-06-18: 30 mL via ORAL

## 2013-06-15 MED ORDER — ACETAMINOPHEN 325 MG PO TABS
650.0000 mg | ORAL_TABLET | Freq: Four times a day (QID) | ORAL | Status: DC | PRN
  Administered 2013-06-16: 650 mg via ORAL

## 2013-06-15 NOTE — Progress Notes (Signed)
Patient ID: Faith [name], female   DOB: Jul 19, 1991, 22 y.o.   MRN: [id number]  [facility] NEUROLOGY Faith Camus A. Gerilyn Pilgrim, MD     [email or web address]          Faith [name] is an 22 y.o. female.   Assessment/Plan:  1. Resolved status epilepticus. The patient has done well with Keppra 1000 mg twice a day. EEG looks good. We will continue with this regimen although the dose can be escalated. Additionally, on additional medications may need to be tried given that she has intractable seizures at baseline. The patient has developed worsening depression over night. She is suicidal. Psychiatric consultation has been sought. She does have a baseline history of depression which may complicated her care. Keppra has been known to worsen depression. We will have to follow this and see if things work out. So far this seemed to be the best medication that she is tried. Other potential option includes Depakote and Vimpat. I do not believe she is tried any of these medications. She has had allergic reaction to Lamictal.   2. Post seizure headaches. We will consider analgesics such as Motrin 800 mg or Fioricet.   She is sleeping but easily arousable. She reports that she is depressed. She complains of headache which is likely due to multiple seizures. She does have suicidal ideation. Psychiatric services have been contacted. She complains of a sore lip and seemed to have a cold sore developing lower lip right side. She is otherwise lucid and coherent. Pupils are reactive and extraocular movements are full without nystagmus. She moved both sides well.     Objective: Vital signs in last 24 hours: Temp:  [97.9 F (36.6 C)-98.8 F (37.1 C)] 97.9 F (36.6 C) (09/11 0400) Pulse Rate:  [25-106] 57 (09/11 0600) Resp:  [13-24] 17 (09/11 0600) BP: (75-106)/(39-80) 75/39 mmHg (09/11 0600) SpO2:  [82 %-100 %] 96 % (09/11 0600) Weight:  [94.9 kg (209 lb 3.5 oz)] 94.9 kg (209 lb 3.5 oz) (09/11 0500)  Intake/Output  from previous day: 09/10 0701 - 09/11 0700 In: 4030 [P.O.:1320; I.V.:2600; IV Piggyback:110] Out: 1050 [Urine:1050] Intake/Output this shift:   Nutritional status: Carb Control   Lab Results: Results for orders placed during the hospital encounter of 06/13/13 (from the past 48 hour(s))  PHENYTOIN LEVEL, TOTAL     Status: Abnormal   Collection Time    06/13/13 10:48 PM      Result Value Range   Phenytoin Lvl <2.5 (*) 10.0 - 20.0 ug/mL  VALPROIC ACID LEVEL     Status: Abnormal   Collection Time    06/13/13 10:48 PM      Result Value Range   Valproic Acid Lvl <10.0 (*) 50.0 - 100.0 ug/mL  CBC WITH DIFFERENTIAL     Status: Abnormal   Collection Time    06/13/13 10:48 PM      Result Value Range   WBC 12.1 (*) 4.0 - 10.5 K/uL   RBC 4.94  3.87 - 5.11 MIL/uL   Hemoglobin 11.7 (*) 12.0 - 15.0 g/dL   HCT 40.9  81.1 - 91.4 %   MCV 74.5 (*) 78.0 - 100.0 fL   MCH 23.7 (*) 26.0 - 34.0 pg   MCHC 31.8  30.0 - 36.0 g/dL   RDW 78.2  95.6 - 21.3 %   Platelets 366  150 - 400 K/uL   Neutrophils Relative % 71  43 - 77 %   Lymphocytes Relative 21  12 - 46 %   Monocytes  Relative 6  3 - 12 %   Eosinophils Relative 2  0 - 5 %   Basophils Relative 0  0 - 1 %   Neutro Abs 8.7 (*) 1.7 - 7.7 K/uL   Lymphs Abs 2.5  0.7 - 4.0 K/uL   Monocytes Absolute 0.7  0.1 - 1.0 K/uL   Eosinophils Absolute 0.2  0.0 - 0.7 K/uL   Basophils Absolute 0.0  0.0 - 0.1 K/uL   Smear Review MORPHOLOGY UNREMARKABLE    BASIC METABOLIC PANEL     Status: Abnormal   Collection Time    06/13/13 10:48 PM      Result Value Range   Sodium 138  135 - 145 mEq/L   Potassium 3.6  3.5 - 5.1 mEq/L   Chloride 104  96 - 112 mEq/L   CO2 24  19 - 32 mEq/L   Glucose, Bld 106 (*) 70 - 99 mg/dL   BUN 10  6 - 23 mg/dL   Creatinine, Ser 1.61  0.50 - 1.10 mg/dL   Calcium 8.9  8.4 - 09.6 mg/dL   GFR calc non Af Amer >90  >90 mL/min   GFR calc Af Amer >90  >90 mL/min   Comment: (NOTE)     The eGFR has been calculated using the CKD EPI  equation.     This calculation has not been validated in all clinical situations.     eGFR's persistently <90 mL/min signify possible Chronic Kidney     Disease.  MRSA PCR SCREENING     Status: None   Collection Time    06/14/13 12:51 AM      Result Value Range   MRSA by PCR NEGATIVE  NEGATIVE   Comment:            The GeneXpert MRSA Assay (FDA     approved for NASAL specimens     only), is one component of a     comprehensive MRSA colonization     surveillance program. It is not     intended to diagnose MRSA     infection nor to guide or     monitor treatment for     MRSA infections.  TSH     Status: None   Collection Time    06/14/13  1:58 AM      Result Value Range   TSH 2.111  0.350 - 4.500 uIU/mL   Comment: Performed at [facility]  HEMOGLOBIN A1C     Status: None   Collection Time    06/14/13  1:58 AM      Result Value Range   Hemoglobin A1C 5.2  <5.7 %   Comment: (NOTE)                                                                               According to the ADA Clinical Practice Recommendations for 2011, when     HbA1c is used as a screening test:      >=6.5%   Diagnostic of Diabetes Mellitus               (if abnormal result is confirmed)     5.7-6.4%   Increased risk  of developing Diabetes Mellitus     References:Diagnosis and Classification of Diabetes Mellitus,Diabetes     Care,2011,34(Suppl 1):S62-S69 and Standards of Medical Care in             Diabetes - 2011,Diabetes Care,2011,34 (Suppl 1):S11-S61.   Mean Plasma Glucose 103  <117 mg/dL   Comment: Performed at [facility]  GLUCOSE, CAPILLARY     Status: None   Collection Time    06/14/13  2:12 AM      Result Value Range   Glucose-Capillary 96  70 - 99 mg/dL   Comment 1 Documented in Chart     Comment 2 Notify RN    CBC     Status: Abnormal   Collection Time    06/14/13  5:21 AM      Result Value Range   WBC 9.0  4.0 - 10.5 K/uL   RBC 4.71  3.87 - 5.11 MIL/uL   Hemoglobin  11.0 (*) 12.0 - 15.0 g/dL   HCT 45.4 (*) 09.8 - 11.9 %   MCV 74.9 (*) 78.0 - 100.0 fL   MCH 23.4 (*) 26.0 - 34.0 pg   MCHC 31.2  30.0 - 36.0 g/dL   RDW 14.7  82.9 - 56.2 %   Platelets 352  150 - 400 K/uL  COMPREHENSIVE METABOLIC PANEL     Status: Abnormal   Collection Time    06/14/13  5:21 AM      Result Value Range   Sodium 141  135 - 145 mEq/L   Potassium 3.8  3.5 - 5.1 mEq/L   Chloride 108  96 - 112 mEq/L   CO2 24  19 - 32 mEq/L   Glucose, Bld 102 (*) 70 - 99 mg/dL   BUN 9  6 - 23 mg/dL   Creatinine, Ser 1.30  0.50 - 1.10 mg/dL   Calcium 8.7  8.4 - 86.5 mg/dL   Total Protein 6.6  6.0 - 8.3 g/dL   Albumin 3.3 (*) 3.5 - 5.2 g/dL   AST 10  0 - 37 U/L   ALT 12  0 - 35 U/L   Alkaline Phosphatase 65  39 - 117 U/L   Total Bilirubin <0.1 (*) 0.3 - 1.2 mg/dL   GFR calc non Af Amer >90  >90 mL/min   GFR calc Af Amer >90  >90 mL/min   Comment: (NOTE)     The eGFR has been calculated using the CKD EPI equation.     This calculation has not been validated in all clinical situations.     eGFR's persistently <90 mL/min signify possible Chronic Kidney     Disease.  GLUCOSE, CAPILLARY     Status: None   Collection Time    06/14/13  7:29 AM      Result Value Range   Glucose-Capillary 92  70 - 99 mg/dL  GLUCOSE, CAPILLARY     Status: None   Collection Time    06/14/13 11:47 AM      Result Value Range   Glucose-Capillary 96  70 - 99 mg/dL  CORTISOL     Status: None   Collection Time    06/14/13  1:09 PM      Result Value Range   Cortisol, Plasma 8.6     Comment: (NOTE)     AM:  4.3 - 22.4 ug/dL     PM:  3.1 - 78.4 ug/dL     Performed at [facility]  GLUCOSE, CAPILLARY  Status: None   Collection Time    06/14/13  4:31 PM      Result Value Range   Glucose-Capillary 89  70 - 99 mg/dL  GLUCOSE, CAPILLARY     Status: Abnormal   Collection Time    06/14/13  9:07 PM      Result Value Range   Glucose-Capillary 101 (*) 70 - 99 mg/dL   Comment 1 Notify RN    CBC      Status: Abnormal   Collection Time    06/15/13  5:01 AM      Result Value Range   WBC 9.6  4.0 - 10.5 K/uL   RBC 4.63  3.87 - 5.11 MIL/uL   Hemoglobin 10.7 (*) 12.0 - 15.0 g/dL   HCT 16.1 (*) 09.6 - 04.5 %   MCV 75.4 (*) 78.0 - 100.0 fL   MCH 23.1 (*) 26.0 - 34.0 pg   MCHC 30.7  30.0 - 36.0 g/dL   RDW 40.9  81.1 - 91.4 %   Platelets 323  150 - 400 K/uL  BASIC METABOLIC PANEL     Status: None   Collection Time    06/15/13  5:01 AM      Result Value Range   Sodium 138  135 - 145 mEq/L   Potassium 4.2  3.5 - 5.1 mEq/L   Chloride 107  96 - 112 mEq/L   CO2 22  19 - 32 mEq/L   Glucose, Bld 87  70 - 99 mg/dL   BUN 6  6 - 23 mg/dL   Creatinine, Ser 7.82  0.50 - 1.10 mg/dL   Calcium 8.8  8.4 - 95.6 mg/dL   GFR calc non Af Amer >90  >90 mL/min   GFR calc Af Amer >90  >90 mL/min   Comment: (NOTE)     The eGFR has been calculated using the CKD EPI equation.     This calculation has not been validated in all clinical situations.     eGFR's persistently <90 mL/min signify possible Chronic Kidney     Disease.  GLUCOSE, CAPILLARY     Status: None   Collection Time    06/15/13  7:36 AM      Result Value Range   Glucose-Capillary 85  70 - 99 mg/dL    Lipid Panel No results found for this basename: CHOL, TRIG, HDL, CHOLHDL, VLDL, LDLCALC,  in the last 72 hours  Studies/Results: No results found.  Medications:  Scheduled Meds: . clonazePAM  1 mg Oral QHS  . enoxaparin (LOVENOX) injection  40 mg Subcutaneous Q24H  . FLUoxetine  20 mg Oral Daily  . folic acid  1 mg Oral Daily  . insulin aspart  0-5 Units Subcutaneous QHS  . insulin aspart  0-9 Units Subcutaneous TID WC  . levETIRAcetam  1,000 mg Intravenous Q12H   Continuous Infusions: . 0.9 % NaCl with KCl 20 mEq / L 125 mL/hr at 06/15/13 0615   PRN Meds:.acetaminophen, bisacodyl, LORazepam, ondansetron (ZOFRAN) IV, polyethylene glycol    LOS: 2 days   Faith [name] A. Gerilyn [name], M.D.  Diplomate, Biomedical engineer of Psychiatry and  Neurology ( Neurology).

## 2013-06-15 NOTE — Progress Notes (Signed)
The patient is receiving Keppra by the intravenous route.  Based on criteria approved by the Pharmacy and Therapeutics Committee and the Medical Executive Committee, the medication is being converted to the equivalent oral dose form.  These criteria include: -No Active GI bleeding -Able to tolerate diet of full liquids (or better) or tube feeding OR able to tolerate other medications by the oral or enteral route  If you have any questions about this conversion, please contact the Pharmacy Department (ext [phone]).  Thank you.  Elson Clan, [facility] 06/15/2013 11:18 AM

## 2013-06-15 NOTE — Procedures (Signed)
[facility] Duncan Alejandro A. Gerilyn Pilgrim, MD     [email or web address]        [name], Faith [name]                ACCOUNT NO.:  [id number]  MEDICAL RECORD NO.:  [id number]  LOCATION:  EE                           FACILITY:  [facility]  PHYSICIAN:  Lenox Bink A. Gerilyn Pilgrim, M.D. DATE OF BIRTH:  1991-01-10  DATE OF PROCEDURE:  06/14/2013 DATE OF DISCHARGE:                             EEG INTERPRETATION   HISTORY:  This 22 year old presents with status epilepticus, has a baseline history of intractable seizures.  MEDICATIONS:  Clonazepam, Colace, Tylenol, Lovenox, Prozac, folic acid, insulin, Keppra, Ativan, and Zofran.  ANALYSIS:  A 16-channel recording using standard 10/20 measurements is conducted for 20 minute.  The patient has 2 well-defined posterior dominant rhythm of 15 hertz and also 10 hertz.  There is a high frequency beta activity observed in the frontal areas.  Awake and drowsy activities are recorded.  Photic stimulation and hypoventilation were not carried out.  There is no focal or lateralized slowing observed. There is no epileptiform activity observed.  IMPRESSION:  Normal recording of awake and drowsy states.     Aubriauna Riner A. Gerilyn Pilgrim, M.D.     KAD/MEDQ  D:  06/15/2013  T:  06/15/2013  Job:  [id number]

## 2013-06-15 NOTE — BH Assessment (Signed)
Tele Assessment Note   Forrest Demuro is an 22 y.o. female. Pt presented voluntarily to [facility] ED after having seizure. Teleassessment machine isn't working on the Games developer speaks w/ pt via telephone. Pt endorses depressed mood.  She endorses SI and sts that she has 4 prior suicide attempts by cutting wrists. Pt sts that she goes to [facility] in [place] [phone]) for med management and also goes to group therapy there. Pt signed consent to speak with them. Pt sts that she signed up for their service of community support team on 9/8.Marland Kitchen Pt sts she lives with her fiance of 11 months. She endorses tearfulness and isolating. Pt sts she quit taking her psych meds 3 days ago but is unable to state reason for stopping meds. Pt cooperative. She is oriented. Pt denies clear plan but is unable to contract for safety. Pt needs inpatient.   Axis I: Major Depressive Disorder, Recurrent, Severe without Psychotic Features Axis II: Deferred Axis III:  Past Medical History  Diagnosis Date  . Asthma   . Seizures   . Diabetes mellitus without complication   . Tubal pregnancy   . Heart murmur    Axis IV: other psychosocial or environmental problems and problems related to social environment Axis V: 31-40 impairment in reality testing  Past Medical History:  Past Medical History  Diagnosis Date  . Asthma   . Seizures   . Diabetes mellitus without complication   . Tubal pregnancy   . Heart murmur     Past Surgical History  Procedure Laterality Date  . Dilation and curettage of uterus    . Ovarian cyst removed    . Tonsillectomy    . Tonsillectomy and adenoidectomy      Family History: History reviewed. No pertinent family history.  Social History:  reports that she has been smoking Cigarettes.  She has been smoking about 0.00 packs per day. She does not have any smokeless tobacco history on file. She reports that  drinks alcohol. She reports that she does not use illicit  drugs.  Additional Social History:  Alcohol / Drug Use Pain Medications: see PTA meds list Prescriptions: see pta meds list Over the Counter: see pta meds list History of alcohol / drug use?: No history of alcohol / drug abuse  CIWA: CIWA-Ar BP: 99/64 mmHg Pulse Rate: 86 COWS:    Allergies:  Allergies  Allergen Reactions  . Morphine And Related Other (See Comments)    bradycardia  . Tomato Anaphylaxis  . Toradol [Ketorolac Tromethamine] Anaphylaxis and Hives  . Iohexol     Pt. States she can not take iohexol but won't state reaction type.  . Lamictal [Lamotrigine]   . Sulfa Antibiotics Hives  . Latex Rash  . Mushroom Extract Complex Rash  . Tramadol Swelling and Rash    Home Medications:  Medications Prior to Admission  Medication Sig Dispense Refill  . acetaminophen (TYLENOL) 500 MG tablet Take 500-1,000 mg by mouth daily as needed for pain.      Marland Kitchen albuterol (PROVENTIL HFA;VENTOLIN HFA) 108 (90 BASE) MCG/ACT inhaler Inhale 2 puffs into the lungs every 6 (six) hours as needed. For shortness of breath      . clonazePAM (KLONOPIN) 1 MG tablet Take 1 mg by mouth 2 (two) times daily.      . Multiple Vitamin (MULTIVITAMIN WITH MINERALS) TABS tablet Take 1 tablet by mouth daily.      . mupirocin cream (BACTROBAN) 2 % Apply 1 application  topically 3 (three) times daily.      Marland Kitchen oxyCODONE-acetaminophen (PERCOCET/ROXICET) 5-325 MG per tablet Take 1 tablet by mouth every 4 (four) hours as needed for pain.      . Vitamin D, Ergocalciferol, (DRISDOL) 50000 UNITS CAPS capsule Take 50,000 Units by mouth every 7 (seven) days. Takes Wednesday        OB/GYN Status:  Patient's last menstrual period was 06/10/2013.  General Assessment Data Location of Assessment: [facility] Assessment Services Is this a Tele or Face-to-Face Assessment?: Tele Assessment Is this an Initial Assessment or a Re-assessment for this encounter?: Initial Assessment Living Arrangements: Spouse/significant other Can pt  return to current living arrangement?: Yes Admission Status: Voluntary Is patient capable of signing voluntary admission?: Yes Transfer from: Acute Hospital     [facility] Crisis Care Plan Living Arrangements: Spouse/significant other  Education Status Is patient currently in school?: No Current Grade: n Highest grade of school patient has completed: 11  Risk to self Suicidal Ideation: Yes-Currently Present Suicidal Intent: No Is patient at risk for suicide?: Yes Suicidal Plan?: No Access to Means: No What has been your use of drugs/alcohol within the last 12 months?: na Previous Attempts/Gestures: Yes How many times?: 4 Other Self Harm Risks: none Triggers for Past Attempts: Unpredictable Intentional Self Injurious Behavior: None Family Suicide History: No Recent stressful life event(s): Financial Problems Persecutory voices/beliefs?: No Depression: Yes Depression Symptoms: Tearfulness;Despondent;Isolating Substance abuse history and/or treatment for substance abuse?: No Suicide prevention information given to non-admitted patients: Not applicable  Risk to Others Homicidal Ideation: No Thoughts of Harm to Others: No Current Homicidal Intent: No Current Homicidal Plan: No Access to Homicidal Means: No Identified Victim: na History of harm to others?: No Assessment of Violence: None Noted Violent Behavior Description: na Does patient have access to weapons?: No Criminal Charges Pending?: No Does patient have a court date: No  Psychosis Hallucinations: None noted Delusions: None noted  Mental Status Report Speech: Logical/coherent Level of Consciousness: Alert Mood: Depressed;Sad Affect: Appropriate to circumstance Anxiety Level: None Thought Processes: Coherent;Relevant Judgement: Unimpaired Orientation: Person;Place;Time;Situation Obsessive Compulsive Thoughts/Behaviors: None  Cognitive Functioning Concentration: Decreased Memory: Recent Intact;Remote  Intact IQ: Average Insight: Fair Impulse Control: Fair Appetite: Good Weight Loss: 0 Weight Gain: 0 Sleep: No Change Total Hours of Sleep: 5 Vegetative Symptoms: None  ADLScreening [facility] Assessment Services) Patient's cognitive ability adequate to safely complete daily activities?: Yes Patient able to express need for assistance with ADLs?: Yes Independently performs ADLs?: Yes (appropriate for developmental age)  Prior Inpatient Therapy Prior Inpatient Therapy: Yes Prior Therapy Dates: various years Prior Therapy Facilty/Provider(s): [facility] and other hospitals Reason for Treatment: depression, SI  Prior Outpatient Therapy Prior Outpatient Therapy: Yes Prior Therapy Dates: currently Prior Therapy Facilty/Provider(s): [facility] Reason for Treatment: depression, med management  ADL Screening (condition at time of admission) Patient's cognitive ability adequate to safely complete daily activities?: Yes Is the patient deaf or have difficulty hearing?: No Does the patient have difficulty seeing, even when wearing glasses/contacts?: No Does the patient have difficulty concentrating, remembering, or making decisions?: No Patient able to express need for assistance with ADLs?: Yes Does the patient have difficulty dressing or bathing?: No Independently performs ADLs?: Yes (appropriate for developmental age) Does the patient have difficulty walking or climbing stairs?: No Weakness of Legs: None Weakness of Arms/Hands: None  Home Assistive Devices/Equipment Home Assistive Devices/Equipment: Eyeglasses;Nebulizer;CBG Meter  Therapy Consults (therapy consults require a physician order) PT Evaluation Needed: No OT Evalulation Needed: No SLP Evaluation Needed: No  Abuse/Neglect Assessment (Assessment to be complete while patient is alone) Physical Abuse: Denies Verbal Abuse: Denies Sexual Abuse: Denies Exploitation of patient/patient's resources: Denies Self-Neglect: Denies Values /  Beliefs Cultural Requests During Hospitalization: None Spiritual Requests During Hospitalization: None Consults Spiritual Care Consult Needed: No Social Work Consult Needed: Yes (Comment) Advance Directives (For Healthcare) Advance Directive: Patient does not have advance directive;Patient would not like information Pre-existing out of facility DNR order (yellow form or pink MOST form): No Nutrition Screen- [facility] Adult/[facility]/AP Patient's home diet: Regular Have you recently lost weight without trying?: No Have you been eating poorly because of a decreased appetite?: No Malnutrition Screening Tool Score: 0  Additional Information 1:1 In Past 12 Months?: No CIRT Risk: No Elopement Risk: No Does patient have medical clearance?: Yes     Disposition:  Disposition Initial Assessment Completed for this Encounter: Yes Disposition of Patient: Inpatient treatment program  Donnamarie Rossetti P 06/15/2013 4:07 PM

## 2013-06-15 NOTE — BH Assessment (Addendum)
Spoke with Lindaann Slough, RN informed her that pt has been accepted to inpatient treatment by Dr. Dub Mikes for inpatient treatment. Pt's bed assignment is 501-2.  Faxed Voluntary Form and Consent to release to pt's nurse [phone] to have pt sign. Informed nurse that she will need to fax support paperwork to [facility] prior to pt transfer as well as sending original consent documents to [facility] with pt prior to pt's arrival to [facility]. Consulted with Dr.Jehanzeb Memon who is in agreement with disposition for pt to be admitted to [facility] for inpatient treatment. Dr. Kerry Hough agreed to contact TTS if he has any issues or concerns with patient.   Glorious Peach, MS, LCASA Assessment Counselor

## 2013-06-15 NOTE — Progress Notes (Signed)
Paige with [facility] called Clinical research associate and told Clinical research associate after reading more of the patients information she did not feel comfortable discharging the patient until other information was received.  Sitter has been discontinued. Dr. Kerry Hough notified.  Dr. Kerry Hough wants [facility] to accept the patient to [facility] if she is not to be discharged because she is medically cleared.  Attempted to call Paige at [facility].  Awaiting a return call.

## 2013-06-15 NOTE — Progress Notes (Signed)
Pt discharged to care of [facility] staff to be transported to [facility].   Pt complained of headache just prior to discharge and was given Tylenol per current orders.  No other complaints voiced.  Pt left ambulatory with [facility] staff.

## 2013-06-15 NOTE — Progress Notes (Signed)
[facility] PROGRESS NOTE  Faith [name] ZOX:[id number] DOB: 1991-07-17 DOA: 06/13/2013 PCP: No primary provider on file.  Assessment/Plan: 1. Status epilepticus.  Appears to have resolved.  Neurology following.  Patient has known seizure disorder.  She has been non compliant with medications.  EEG is normal.  Keppra has been increased to 1000mg  bid. Will continue to follow 2. Hypotension, unclear etiology.  Improving with fluids.  Cortisol normal. 3. Diabetes.  Continue sliding scale insulin 4. Depression.  Patient reports that she is depressed and verbalized ideation to hurt herself.  Psychiatry consult pending.  Code Status: full code Family Communication: no family present Disposition Plan: pending psychiatry evaluation.  If cleared by psychiatry, can likely discharge home later today.   Consultants:  Neurology  psychiatry  Procedures:  none  Antibiotics:  none  HPI/Subjective: Complains of headache.   Objective: Filed Vitals:   06/15/13 0600  BP: 75/39  Pulse: 57  Temp:   Resp: 17    Intake/Output Summary (Last 24 hours) at 06/15/13 0953 Last data filed at 06/15/13 0600  Gross per 24 hour  Intake   3475 ml  Output    850 ml  Net   2625 ml   Filed Weights   06/13/13 2159 06/14/13 0100 06/15/13 0500  Weight: 83.008 kg (183 lb) 95.7 kg (210 lb 15.7 oz) 94.9 kg (209 lb 3.5 oz)    Exam:   General:  NAD  Cardiovascular: S1, S2 RRR  Respiratory: CTA B  Abdomen: Soft, nt, obese, bs+  Musculoskeletal: no edema b/l   Data Reviewed: Basic Metabolic Panel:  Recent Labs Lab 06/13/13 2248 06/14/13 0521 06/15/13 0501  NA 138 141 138  K 3.6 3.8 4.2  CL 104 108 107  CO2 24 24 22   GLUCOSE 106* 102* 87  BUN 10 9 6   CREATININE 0.72 0.72 0.65  CALCIUM 8.9 8.7 8.8   Liver Function Tests:  Recent Labs Lab 06/14/13 0521  AST 10  ALT 12  ALKPHOS 65  BILITOT <0.1*  PROT 6.6  ALBUMIN 3.3*   No results found for this basename: LIPASE,  AMYLASE,  in the last 168 hours No results found for this basename: AMMONIA,  in the last 168 hours CBC:  Recent Labs Lab 06/13/13 2248 06/14/13 0521 06/15/13 0501  WBC 12.1* 9.0 9.6  NEUTROABS 8.7*  --   --   HGB 11.7* 11.0* 10.7*  HCT 36.8 35.3* 34.9*  MCV 74.5* 74.9* 75.4*  PLT 366 352 323   Cardiac Enzymes: No results found for this basename: CKTOTAL, CKMB, CKMBINDEX, TROPONINI,  in the last 168 hours BNP (last 3 results) No results found for this basename: PROBNP,  in the last 8760 hours CBG:  Recent Labs Lab 06/14/13 0729 06/14/13 1147 06/14/13 1631 06/14/13 2107 06/15/13 0736  GLUCAP 92 96 89 101* 85    Recent Results (from the past 240 hour(s))  MRSA PCR SCREENING     Status: None   Collection Time    06/14/13 12:51 AM      Result Value Range Status   MRSA by PCR NEGATIVE  NEGATIVE Final   Comment:            The GeneXpert MRSA Assay (FDA     approved for NASAL specimens     only), is one component of a     comprehensive MRSA colonization     surveillance program. It is not     intended to diagnose MRSA     infection nor  to guide or     monitor treatment for     MRSA infections.     Studies: No results found.  Scheduled Meds: . clonazePAM  1 mg Oral QHS  . enoxaparin (LOVENOX) injection  40 mg Subcutaneous Q24H  . FLUoxetine  20 mg Oral Daily  . folic acid  1 mg Oral Daily  . insulin aspart  0-5 Units Subcutaneous QHS  . insulin aspart  0-9 Units Subcutaneous TID WC  . levETIRAcetam  1,000 mg Intravenous Q12H   Continuous Infusions: . 0.9 % NaCl with KCl 20 mEq / L 125 mL/hr at 06/15/13 5621    Active Problems:   Status epilepticus   Diabetes mellitus type 2 in obese    Time spent:    [facility]  [facility] Pager [phone]. If 7PM-7AM, please contact night-coverage at [email or web address], password [facility] 06/15/2013, 9:53 AM  LOS: 2 days

## 2013-06-15 NOTE — Progress Notes (Signed)
Talked with Faith [name] with [facility]. Patient is cleared per [facility] to be discharged home.  On talking to the  patient she is unable to find a ride home; however after making phone calls she has someone on the way to pick her up.  Waiting on discharge orders.  Suicide precautions discontinued per md orders.

## 2013-06-15 NOTE — Progress Notes (Signed)
Called [facility] to talk to [place].  Discussed information with Paige and Dr. Benetta Spar suggestions.  Paige suggested Dr. Kerry Hough put in a telepsych consult, there are no beds available at [facility] and a "higher-up" would have to make the determination if she were to go.

## 2013-06-15 NOTE — Progress Notes (Signed)
Report given to Lindaann Slough, RN.  Patient ready for transfer to room 302 with suicide precautions and sitter.

## 2013-06-15 NOTE — BHH Counselor (Signed)
Writer consulted with the nurse working with the patient Faith [name] [phone]).  Writer will assess the patient at 10:30am

## 2013-06-15 NOTE — Progress Notes (Signed)
On assessment patient verbalized she needed psychiatric services.  On inquiry the writer ask the patient if she felt like hurting herself and she replied, "a little".  Patient had a fight with her boyfriend over the telephone this morning and was tearful.  Patient verbalized he was not her boyfriend anymore and she did not want to talk to him.  She has been talking to him off and on during the morning since verbalizing she did not want to talk to him.  No seizure activity noted.  MD notified for patients verbalized ideation to hurt herself.  Suicide precautions in place with sitter at the bedside.  TTS consult in process.

## 2013-06-15 NOTE — Discharge Summary (Signed)
Physician Discharge Summary  Faith [name] ZOX:[id number] DOB: March 28, 1991 DOA: 06/13/2013  PCP: No primary provider on file.  Admit date: 06/13/2013 Discharge date: 06/15/2013  Time spent: 45 minutes  Recommendations for Outpatient Follow-up:  1. Patient will be transferred to behavioral health for further psychiatric care  Discharge Diagnoses:  Active Problems:   Status epilepticus   Diabetes mellitus type 2 in obese Depression with suicidal ideations  Discharge Condition: stable  Diet recommendation: low carb  Filed Weights   06/13/13 2159 06/14/13 0100 06/15/13 0500  Weight: 83.008 kg (183 lb) 95.7 kg (210 lb 15.7 oz) 94.9 kg (209 lb 3.5 oz)    History of present illness:  Faith [name] is a 22 y.o. female. Obese young Caucasian lady with a history of seizure disorder since age 55, was reportedly placed on Keppra about age 9, but does not take it because she doesn't like the way it makes her feel. She reports that she was given Klonopin as a substitute, but continues to have regular seizures maybe 3 times per day.  Was brought in by EMS in post ictal state after a witnessed seizure, and had 3 further seizures in the emergency room associated with tongue biting and postictal drowsiness. Patient was given 500 mg of care per intravenously; Hospitalist service was called.  Patient had further seizure after receiving Keppra  Sometimes she knows when they're coming on sometimes not. She reports she has a driving license despite a known seizure and will pull over to the side of the road when she feels a seizure coming on. Reports she was admitted to the ICU in [place], [place] recently and transferred to a psych ward for suicidal ideations.  Reports Dr. Lenis Noon is trying to find her new neurologist as he is retiring.  Takes metformin for diabetes; reports a heart murmur.   Hospital Course:  This patient was admitted to the hospital with recurrent seizures. She was admitted to the intensive care  unit. She reported noncompliance with her seizure medications. She was started back on Keppra and doses increased to 1000 mg twice a day. EEG was done which did not show any any epileptiform discharges. She was seen by neurology who recommended to continue on Keppra with followup with her neurologist. She has been cleared for discharge by neurology. On the day of discharge, she did express suicidal ideations and depression. She has a history of psychiatric admissions in the past. She was evaluated by a tele psychiatry and was recommended the patient be transferred to behavioral health for further management.  Procedures:  EEG 9/10: normal recording of awake and drowsy states  Consultations:  Neurology  Psychiatry  Discharge Exam: Filed Vitals:   06/15/13 1600  BP:   Pulse:   Temp: 98 F (36.7 C)  Resp:     General: NAD Cardiovascular: S1, S2 RRR Respiratory: CTA B  Discharge Instructions   Future Appointments Provider Department Dept Phone   06/26/2013 11:45 AM Tilda Burrow, MD [facility] [phone]       Medication List         acetaminophen 500 MG tablet  Commonly known as:  TYLENOL  Take 500-1,000 mg by mouth daily as needed for pain.     albuterol 108 (90 BASE) MCG/ACT inhaler  Commonly known as:  PROVENTIL HFA;VENTOLIN HFA  Inhale 2 puffs into the lungs every 6 (six) hours as needed. For shortness of breath     clonazePAM 1 MG tablet  Commonly known as:  Scarlette Calico  Take 1 mg by mouth 2 (two) times daily.     FLUoxetine 20 MG capsule  Commonly known as:  PROZAC  Take 1 capsule (20 mg total) by mouth daily.     levETIRAcetam 1000 MG tablet  Commonly known as:  KEPPRA  Take 1 tablet (1,000 mg total) by mouth 2 (two) times daily.     multivitamin with minerals Tabs tablet  Take 1 tablet by mouth daily.     mupirocin cream 2 %  Commonly known as:  BACTROBAN  Apply 1 application topically 3 (three) times daily.     oxyCODONE-acetaminophen 5-325  MG per tablet  Commonly known as:  PERCOCET/ROXICET  Take 1 tablet by mouth every 4 (four) hours as needed for pain.     Vitamin D (Ergocalciferol) 50000 UNITS Caps capsule  Commonly known as:  DRISDOL  Take 50,000 Units by mouth every 7 (seven) days. Takes Wednesday       Allergies  Allergen Reactions  . Morphine And Related Other (See Comments)    bradycardia  . Tomato Anaphylaxis  . Toradol [Ketorolac Tromethamine] Anaphylaxis and Hives  . Iohexol     Pt. States she can not take iohexol but won't state reaction type.  . Lamictal [Lamotrigine]   . Sulfa Antibiotics Hives  . Latex Rash  . Mushroom Extract Complex Rash  . Tramadol Swelling and Rash      The results of significant diagnostics from this hospitalization (including imaging, microbiology, ancillary and laboratory) are listed below for reference.    Significant Diagnostic Studies: Ct Abdomen Pelvis Wo Contrast  05/26/2013   *RADIOLOGY REPORT*  Clinical Data: Left lower abdominal pain.  CT ABDOMEN AND PELVIS WITHOUT CONTRAST  Technique:  Multidetector CT imaging of the abdomen and pelvis was performed following the standard protocol without intravenous contrast.  Comparison: 04/09/2009  Findings: There is a stable 3 mm nodule at the left lung base on sequence #3, image 10.  This has not significantly changed since 2010 and likely represents a benign etiology.  There is also a stable 3 mm nodule in the right lower lobe on image #8 which is unchanged.  No evidence for pneumoperitoneum.  Normal appearance of the liver, gallbladder, pancreas and adrenal glands.  Normal appearance of the stomach.  Negative for kidney stones or hydronephrosis.  No evidence for free fluid or lymphadenopathy.  No gross abnormality to the uterus, ovaries or urinary bladder.  No gross abnormality to the small bowel, large bowel or appendix.  No acute bony abnormality.  IMPRESSION: No acute abnormalities within the abdomen or pelvis.   Original Report  Authenticated By: Richarda Overlie, M.D.   Ct Head Wo Contrast  05/29/2013   *RADIOLOGY REPORT*  Clinical Data: Seizures, post fall, now with right temporal lobe headache  CT HEAD WITHOUT CONTRAST  Technique:  Contiguous axial images were obtained from the base of the skull through the vertex without contrast.  Comparison: None.  Findings:  Wallace Cullens white differentiation is maintained.  No CT evidence of acute large territory infarct.  No intraparenchymal or extra-axial mass or hemorrhage.  Normal size and configuration of the ventricles and basilar cisterns.  No midline shift.  There is pneumatization of the right frontal sinus.  The remaining paranasal sinuses and mastoid air cells are normally aerated.  The regional soft tissues are normal.  No displaced calvarial fracture.  IMPRESSION: Negative noncontrast head CT.   Original Report Authenticated By: Tacey Ruiz, MD    Microbiology: Recent Results (from  the past 240 hour(s))  MRSA PCR SCREENING     Status: None   Collection Time    06/14/13 12:51 AM      Result Value Range Status   MRSA by PCR NEGATIVE  NEGATIVE Final   Comment:            The GeneXpert MRSA Assay (FDA     approved for NASAL specimens     only), is one component of a     comprehensive MRSA colonization     surveillance program. It is not     intended to diagnose MRSA     infection nor to guide or     monitor treatment for     MRSA infections.     Labs: Basic Metabolic Panel:  Recent Labs Lab 06/13/13 2248 06/14/13 0521 06/15/13 0501  NA 138 141 138  K 3.6 3.8 4.2  CL 104 108 107  CO2 24 24 22   GLUCOSE 106* 102* 87  BUN 10 9 6   CREATININE 0.72 0.72 0.65  CALCIUM 8.9 8.7 8.8   Liver Function Tests:  Recent Labs Lab 06/14/13 0521  AST 10  ALT 12  ALKPHOS 65  BILITOT <0.1*  PROT 6.6  ALBUMIN 3.3*   No results found for this basename: LIPASE, AMYLASE,  in the last 168 hours No results found for this basename: AMMONIA,  in the last 168 hours CBC:  Recent  Labs Lab 06/13/13 2248 06/14/13 0521 06/15/13 0501  WBC 12.1* 9.0 9.6  NEUTROABS 8.7*  --   --   HGB 11.7* 11.0* 10.7*  HCT 36.8 35.3* 34.9*  MCV 74.5* 74.9* 75.4*  PLT 366 352 323   Cardiac Enzymes: No results found for this basename: CKTOTAL, CKMB, CKMBINDEX, TROPONINI,  in the last 168 hours BNP: BNP (last 3 results) No results found for this basename: PROBNP,  in the last 8760 hours CBG:  Recent Labs Lab 06/14/13 1631 06/14/13 2107 06/15/13 0736 06/15/13 1148 06/15/13 1628  GLUCAP 89 101* 85 91 98       Signed:  Luverne Zerkle  [facility] 06/15/2013, 6:29 PM

## 2013-06-15 NOTE — Clinical Social Work Note (Signed)
CSW received consult due to suicidal ideations. Original tele-assessment cleared pt to go home, but now it appears they have requested for pt to have telepsych. CSW will follow up tomorrow with recommendations.  Derenda Fennel, [place] [phone]

## 2013-06-15 NOTE — BH Assessment (Signed)
Writer called pt's RN Pam to notify her teleassessment time has changed from 10:30 to 11:15.  Evette Cristal, [place] Assessment Counselor

## 2013-06-16 ENCOUNTER — Encounter (HOSPITAL_COMMUNITY): Payer: Self-pay | Admitting: *Deleted

## 2013-06-16 ENCOUNTER — Emergency Department (HOSPITAL_COMMUNITY)
Admission: EM | Admit: 2013-06-16 | Discharge: 2013-06-16 | Disposition: A | Discharge status: Other Acute Inpatient Hospital | Payer: Medicaid Other | Attending: Emergency Medicine | Admitting: Emergency Medicine

## 2013-06-16 DIAGNOSIS — Z8742 Personal history of other diseases of the female genital tract: Secondary | ICD-10-CM | POA: Insufficient documentation

## 2013-06-16 DIAGNOSIS — F17200 Nicotine dependence, unspecified, uncomplicated: Secondary | ICD-10-CM | POA: Insufficient documentation

## 2013-06-16 DIAGNOSIS — R569 Unspecified convulsions: Secondary | ICD-10-CM

## 2013-06-16 DIAGNOSIS — R45851 Suicidal ideations: Secondary | ICD-10-CM | POA: Insufficient documentation

## 2013-06-16 DIAGNOSIS — R011 Cardiac murmur, unspecified: Secondary | ICD-10-CM | POA: Insufficient documentation

## 2013-06-16 DIAGNOSIS — J45909 Unspecified asthma, uncomplicated: Secondary | ICD-10-CM | POA: Insufficient documentation

## 2013-06-16 DIAGNOSIS — F3289 Other specified depressive episodes: Secondary | ICD-10-CM | POA: Insufficient documentation

## 2013-06-16 DIAGNOSIS — F329 Major depressive disorder, single episode, unspecified: Secondary | ICD-10-CM | POA: Insufficient documentation

## 2013-06-16 DIAGNOSIS — G40401 Other generalized epilepsy and epileptic syndromes, not intractable, with status epilepticus: Secondary | ICD-10-CM | POA: Insufficient documentation

## 2013-06-16 DIAGNOSIS — E119 Type 2 diabetes mellitus without complications: Secondary | ICD-10-CM | POA: Insufficient documentation

## 2013-06-16 DIAGNOSIS — Z91040 Latex allergy status: Secondary | ICD-10-CM | POA: Insufficient documentation

## 2013-06-16 DIAGNOSIS — F4310 Post-traumatic stress disorder, unspecified: Secondary | ICD-10-CM

## 2013-06-16 DIAGNOSIS — F332 Major depressive disorder, recurrent severe without psychotic features: Principal | ICD-10-CM

## 2013-06-16 LAB — POCT I-STAT, CHEM 8
BUN: 7 mg/dL (ref 6–23)
Calcium, Ion: 1.21 mmol/L (ref 1.12–1.23)
Creatinine, Ser: 0.9 mg/dL (ref 0.50–1.10)
Glucose, Bld: 84 mg/dL (ref 70–99)
Hemoglobin: 13.9 g/dL (ref 12.0–15.0)
Sodium: 140 mEq/L (ref 135–145)
TCO2: 25 mmol/L (ref 0–100)

## 2013-06-16 MED ORDER — NICOTINE 21 MG/24HR TD PT24
21.0000 mg | MEDICATED_PATCH | Freq: Every day | TRANSDERMAL | Status: DC
  Administered 2013-06-16 – 2013-06-19 (×4): 21 mg via TRANSDERMAL
  Filled 2013-06-16 (×6): qty 1

## 2013-06-16 MED ORDER — METFORMIN HCL 500 MG PO TABS
500.0000 mg | ORAL_TABLET | Freq: Two times a day (BID) | ORAL | Status: DC
  Administered 2013-06-16 – 2013-06-19 (×6): 500 mg via ORAL
  Filled 2013-06-16 (×8): qty 1

## 2013-06-16 MED ORDER — FLUOXETINE HCL 20 MG PO CAPS
20.0000 mg | ORAL_CAPSULE | Freq: Every day | ORAL | Status: DC
  Administered 2013-06-16 – 2013-06-19 (×4): 20 mg via ORAL
  Filled 2013-06-16 (×5): qty 1

## 2013-06-16 NOTE — Progress Notes (Signed)
NUTRITION ASSESSMENT  Pt identified as at risk on the Malnutrition Screen Tool  INTERVENTION: 1. Educated patient on the importance of nutrition and encouraged intake of food and beverages. 2. Discussed weight goals.  Educated on a healthy diabetic diet using the plate method.  Patient is able to verbalize.  Discussed slow weight loss with healthy regular intake.    NUTRITION DIAGNOSIS: Unintentional weight loss related to sub-optimal intake as evidenced by pt report.   Goal: Pt to meet >/= 90% of their estimated nutrition needs.  Monitor:  PO intake  Assessment:  Patient admitted with depression and SI, hx of seizures.  Patient is on glucophage for hx of DM with HgbA1c of 5.2 06/14/13.  Patient is unable to read.  Spoke with patient who reports a decreased med since med started here.    22 y.o. female  Height: Ht Readings from Last 1 Encounters:  06/15/13 5' 1.5" (1.562 m)    Weight: Wt Readings from Last 1 Encounters:  06/15/13 206 lb (93.441 kg)    Weight Hx: Wt Readings from Last 10 Encounters:  06/15/13 206 lb (93.441 kg)  06/15/13 209 lb 3.5 oz (94.9 kg)  05/26/13 187 lb (84.823 kg)  05/07/13 189 lb (85.73 kg)  05/01/13 180 lb (81.647 kg)    BMI:  Body mass index is 38.3 kg/(m^2). Pt meets criteria for obesity grade 2 based on current BMI.  Estimated Nutritional Needs: Kcal: 25-30 kcal/kg Protein: > 1 gram protein/kg Fluid: 1 ml/kcal  Diet Order: General Pt is also offered choice of unit snacks mid-morning and mid-afternoon.  Pt is eating as desired.   Lab results and medications reviewed.   Oran Rein, RD, LDN Clinical Inpatient Dietitian Pager:  [phone] Weekend and after hours pager:  [phone]
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
## 2013-06-16 NOTE — ED Notes (Signed)
Sitter at bedside.

## 2013-06-16 NOTE — Tx Team (Signed)
Interdisciplinary Treatment Plan Update (Adult)  Date: 06/16/2013  Time Reviewed:  9:45 AM  Progress in Treatment: Attending groups: Yes Participating in groups:  Yes Taking medication as prescribed:  Yes Tolerating medication:  Yes Family/Significant othe contact made: CSW making attempts Patient understands diagnosis:  Yes Discussing patient identified problems/goals with staff:  Yes Medical problems stabilized or resolved:  Yes Denies suicidal/homicidal ideation: Yes Issues/concerns per patient self-inventory:  Yes Other:  New problem(s) identified: N/A  Discharge Plan or Barriers: Pt will follow up at [facility] for medication management and therapy.  Reason for Continuation of Hospitalization: Anxiety Depression Medication Stabilization  Comments: N/A  Estimated length of stay: 3-5 days  For review of initial/current patient goals, please see plan of care.  Attendees: Patient:     Family:     Physician:  Dr. Javier Glazier 06/16/2013 10:31 AM   Nursing:    06/16/2013 10:31 AM   Clinical Social Worker:  Reyes Ivan, LCSWA 06/16/2013 10:31 AM   Other: Verne Spurr, PA 06/16/2013 10:31 AM   Other:  Frankey Shown, MA care coordination 06/16/2013 10:31 AM   Other:  Juline Patch, LCSW 06/16/2013 10:31 AM   Other:  Audie Box, RN 06/16/2013 10:32 AM   Other: Orlean Patten, RN 06/16/2013 10:32 AM   Other:    Other:    Other:    Other:    Other:     Scribe for Treatment Team:   Carmina Miller, 06/16/2013 10:31 AM
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
## 2013-06-16 NOTE — ED Notes (Signed)
Charge rn tried to transfer pt chart to behavior health, unable to. Will d/c patient.

## 2013-06-16 NOTE — [facility] Group Notes (Signed)
[facility] LCSW Group Therapy  06/16/2013  1:15 PM   Type of Therapy:  Group Therapy  Participation Level:  Did Not Attend   Sylvia Helms [place], LCSWA 06/16/2013 2:25 PM   

## 2013-06-16 NOTE — [facility] Group Notes (Signed)
.  [facility] LCSW Aftercare Discharge Planning Group Note   06/16/2013 8:45 AM  Participation Quality:  Alert and Appropriate   Mood/Affect:  Appropriate, Flat and Depressed  Depression Rating:  8  Anxiety Rating:  8  Thoughts of Suicide:  Pt denies SI/HI  Will you contract for safety?   Yes  Current AVH:  Pt denies  Plan for Discharge/Comments:  Pt attended discharge planning group and actively participated in group.  CSW provided pt with today's workbook.  Pt reports coming to the hospital for depression and SI.  Pt states that she lives in [place] with her fiance and can return home there.  Pt reports having an outpatient provider, [facility] in [place] for medication management and therapy.  CSW will confirm pt's follow up.  No further needs voiced by pt at this time.    Transportation Means: Pt reports access to transportation - pt states her fiance will pick her up  Supports: No supports mentioned at this time  Reyes Ivan, LCSWA 06/16/2013 10:08 AM

## 2013-06-16 NOTE — Progress Notes (Signed)
Patient ID: Faith [name], female   DOB: 30-Aug-1991, 22 y.o.   MRN: [id number] Pt initially attended group but was called out by her doctor.
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
## 2013-06-16 NOTE — Progress Notes (Signed)
[facility] Group Notes:  (Nursing/MHT/Case Management/Adjunct)  Date:  06/16/2013  Time:  2000  Type of Therapy:  Psychoeducational Skills  Participation Level:  Active  Participation Quality:  Appropriate  Affect:  Excited  Cognitive:  Appropriate  Insight:  Good  Engagement in Group:  Developing/Improving  Modes of Intervention:  Education  Summary of Progress/Problems: The patient acknowledged in group that she felt depressed today. She did have a good talk with her fiance. Her goal for tomorrow is to work on her coping skills for dealing with health issues.   Hazle Coca S 06/16/2013, 10:37 PM
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
## 2013-06-16 NOTE — ED Provider Notes (Signed)
CSN: [id number]     Arrival date & time 06/16/13  0421 History   First MD Initiated Contact with Patient 06/16/13 0449     Chief Complaint  Patient presents with  . Seizures   HPI  History provided by the patient and [facility] staff report. Patient is a 22 year old female with history of diabetes and seizure disorder who presents from [facility] with reports of seizure episode. Patient currently being treated for her suicidal ideations. She was recently hospitalized for status epilepticus. She was restarted on Keppra 1000 mg twice a day. She has been taking this and was stabilized prior to transfer to [facility] yesterday.  This evening the patient states that she did take her nighttime dose 2-3 hours later than she normally would but otherwise has not had any other changes. She had a brief seizure episode with shaking. She was found by EMS with some confusion. She complains of some soreness to her tongue but did not have any bleeding. There was no reports of urinary fecal incontinence. She has no other complaints and has felt well otherwise. She continues to express some depression and slight suicidal thoughts.    Past Medical History  Diagnosis Date  . Asthma   . Seizures   . Diabetes mellitus without complication   . Tubal pregnancy   . Heart murmur    Past Surgical History  Procedure Laterality Date  . Dilation and curettage of uterus    . Ovarian cyst removed    . Tonsillectomy    . Tonsillectomy and adenoidectomy     History reviewed. No pertinent family history. History  Substance Use Topics  . Smoking status: Current Every Day Smoker -- 0.50 packs/day for 3 years    Types: Cigarettes  . Smokeless tobacco: Not on file  . Alcohol Use: No     Comment: occ   OB History   Grav Para Term Preterm Abortions TAB SAB Ect Mult Living                 Review of Systems  Constitutional: Negative for fever, chills and diaphoresis.  HENT: Negative for neck pain.   Respiratory: Negative for shortness  of breath.   Cardiovascular: Negative for chest pain.  Genitourinary: Negative for dysuria, frequency, hematuria and flank pain.  Neurological: Negative for headaches.  All other systems reviewed and are negative.    Allergies  Morphine and related; Tomato; Toradol; Iohexol; Lamictal; Sulfa antibiotics; Latex; Mushroom extract complex; and Tramadol  Home Medications   Current Outpatient Rx  Name  Route  Sig  Dispense  Refill  . LORazepam (ATIVAN) 2 MG/ML concentrated solution   Oral   Take 2 mg by mouth every 4 (four) hours as needed (anxiety).          BP 92/56  Pulse 63  Resp 20  SpO2 98%  LMP 06/10/2013 Physical Exam  Nursing note and vitals reviewed. Constitutional: She is oriented to person, place, and time. She appears well-developed and well-nourished. No distress.  HENT:  Head: Normocephalic and atraumatic.  Mouth/Throat: Oropharynx is clear and moist.  No bite marks on the tongue  Eyes: Conjunctivae and EOM are normal. Pupils are equal, round, and reactive to light.  Neck: Normal range of motion. Neck supple.  No cervical midline tenderness  Cardiovascular: Normal rate and regular rhythm.   Pulmonary/Chest: Effort normal and breath sounds normal. No respiratory distress. She has no wheezes. She has no rales.  Abdominal: Soft. There is no tenderness. There is  no rebound and no guarding.  Musculoskeletal: Normal range of motion. She exhibits no edema.  Neurological: She is alert and oriented to person, place, and time. She has normal strength. No cranial nerve deficit or sensory deficit.  Skin: Skin is warm and dry. No rash noted.  Psychiatric: Her behavior is normal. She exhibits a depressed mood. She expresses suicidal ideation. She expresses no homicidal ideation. She expresses no suicidal plans.  She continues to state that she has some depression and slight suicidal thoughts    ED Course  Procedures   Results for orders placed during the hospital  encounter of 06/16/13  GLUCOSE, CAPILLARY      Result Value Range   Glucose-Capillary 97  70 - 99 mg/dL   Comment 1 Notify RN    POCT I-STAT, CHEM 8      Result Value Range   Sodium 140  135 - 145 mEq/L   Potassium 3.6  3.5 - 5.1 mEq/L   Chloride 102  96 - 112 mEq/L   BUN 7  6 - 23 mg/dL   Creatinine, Ser 1.61  0.50 - 1.10 mg/dL   Glucose, Bld 84  70 - 99 mg/dL   Calcium, Ion 0.96  0.45 - 1.23 mmol/L   TCO2 25  0 - 100 mmol/L   Hemoglobin 13.9  12.0 - 15.0 g/dL   HCT 40.9  81.1 - 91.4 %        MDM   1. Seizure       5:00 AM patient seen and evaluated. She is awake and alert. Currently no post ictal confusion. She was recently in the ICU for her status epilepticus. She was stabilized and transferred to [facility] for suicidal ideations. She has continued Keppra 1000 mg twice a day while at [facility] however she reports having her evening dosage to 3 hours later than usual. No other changes.  Patient discussed with attending. Patient received her dose of Keppra late. Has otherwise been taking medications. She has had recent lab testing and urine tests without change tonight. She may be discharged back to [facility].   Angus Seller, PA-C 06/16/13 [phone]
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
## 2013-06-16 NOTE — ED Notes (Signed)
EMS called to [facility].  Found patient seated in wheelchair post ictal. Staff states that she had a seizure but does not know when it started. Patient was A&O x2 and lethargic.

## 2013-06-16 NOTE — Progress Notes (Signed)
Pt in route to [facility] for postictal status. Pt was non-responsive to verbal stimuli during initial encounter. When pt was able to speak, she was unable to identify where she was at. Pt was not observed by staff during the course of her seizure. However the pt was observed with some lip smacking. Pt was hypotensive at 95/54 with a pulse of 58. Charge RN, [employer], Primary school teacher, and emergency services were notified.

## 2013-06-16 NOTE — Progress Notes (Signed)
Patient ID: Faith [name], female   DOB: 1991-04-20, 21 y.o.   MRN: [id number] D. Patient presents with depressed mood, affect blunted. She states '' I'm feeling ok, a little tired because I had a seizure last night and they sent me to the other hospital. But I'm ok. I still feel depressed. '' Patient rates her depression at 7/10 on depression scale 10 being worst 1 being least depressed however when completed self inventory she rated it at 5/10. Patient denies any SI/HI/A/V Hallucinations. Discussed seizure precautions with patient and importance of maintaining home medications (for seizure ) she states ''well the keppra I didn't take it for a few days '' She has been isolative to her room with minimal interaction with peers however with prompting from staff has attended some unit programming. A. Medications given as ordered. Encouraged group attendance and support and encouragement provided. She denies any acute concerns at this time. R. Patient remains calm and cooperative at this time. Will continue to monitor q 15 minutes for safety.

## 2013-06-17 DIAGNOSIS — F411 Generalized anxiety disorder: Secondary | ICD-10-CM

## 2013-06-17 MED ORDER — HYDROXYZINE HCL 25 MG PO TABS
25.0000 mg | ORAL_TABLET | Freq: Four times a day (QID) | ORAL | Status: DC | PRN
  Administered 2013-06-18 – 2013-06-19 (×3): 25 mg via ORAL
  Filled 2013-06-17: qty 1

## 2013-06-17 NOTE — Progress Notes (Signed)
Writer spoke with patient at medication window and she c/o feeling nauseated and received ginger ale to sip. Patient has been observed earlier in group and participated. Patient reported that she had a good day and feels less depressed. Diego reports that she has written down goals that she plans to work on. Writer has observed her on the hallway phone for a long period of time and her conversation has been appropriate and not as upsetting as her conversations have been before. Writer offered encouragement concerning the goals she plans to work on. Patient denies si/hi/a/v hallucinations. Safety maintained with 15 min checks, will continue to monitor.

## 2013-06-17 NOTE — Progress Notes (Signed)
[facility] MD Progress Note  06/17/2013 9:48 AM Faith [name]  MRN:  [id number] Subjective:  Patient upset and had just gotten into an argument with her boyfriend who is 22 yo.  "I just want to die.  If I have to go through this, I just don't want to be here."  He is upset that she is here and feels she is cheating on him because he called her last night.  She did not return his call because she did not get the message, he felt it was because she was with someone else.  Faith [name] feels she needs to move out because since she returned from [place], he seems to want her out of their place so he can be with their room-mate who is suppose to leave on October 1st.  Faith [name] plans to get her money at the beginning of the month and move to a new place.  She has suicidal and homicidal ideations, denies hallucinations.  Faith [name] hopes to talk to her grandmother and see if you can stay with her until the first of the month. Diagnosis:   DSM5:  Trauma-Stressor Disorders:  Posttraumatic Stress Disorder (309.81) Depressive Disorders:  Major Depressive Disorder - Severe (296.23)  Axis I: Anxiety Disorder NOS, Major Depression, Recurrent severe and Post Traumatic Stress Disorder Axis II: Deferred Axis III:  Past Medical History  Diagnosis Date  . Asthma   . Seizures   . Diabetes mellitus without complication   . Tubal pregnancy   . Heart murmur    Axis IV: other psychosocial or environmental problems, problems related to social environment and problems with primary support group Axis V: 41-50 serious symptoms  ADL's:  Intact  Sleep: Good  Appetite:  Good  Suicidal Ideation:  Plan:  Overdose Intent:  Yes Means:  None Homicidal Ideation:  Plan:  None Intent:  None Means:  None AEB (as evidenced by):  Psychiatric Specialty Exam: Review of Systems  Constitutional: Negative.   HENT: Negative.   Eyes: Negative.   Respiratory: Negative.   Cardiovascular: Negative.   Gastrointestinal: Negative.    Genitourinary: Negative.   Musculoskeletal: Negative.   Skin: Negative.   Neurological: Negative.   Endo/Heme/Allergies: Negative.   Psychiatric/Behavioral: Positive for depression. The patient is nervous/anxious.     Blood pressure 106/62, pulse 91, temperature 97.9 F (36.6 C), temperature source Oral, resp. rate 16, height 5' 1.5" (1.562 m), weight 93.441 kg (206 lb), last menstrual period 06/10/2013, SpO2 97.00%.Body mass index is 38.3 kg/(m^2).  General Appearance: Casual  Eye Contact::  Fair  Speech:  Normal Rate  Volume:  Normal  Mood:  Anxious and Depressed  Affect:  Congruent  Thought Process:  Coherent  Orientation:  Full (Time, Place, and Person)  Thought Content:  WDL  Suicidal Thoughts:  Yes.  with intent/plan  Homicidal Thoughts:  Yes  Memory:  Immediate;   Fair Recent;   Fair Remote;   Fair  Judgement:  Fair  Insight:  Lacking  Psychomotor Activity:  Decreased  Concentration:  Fair  Recall:  Fair  Akathisia:  No  Handed:  Right  AIMS (if indicated):     Assets:  Communication Skills Physical Health Resilience  Sleep:  Number of Hours: 6.25   Current Medications: Current Facility-Administered Medications  Medication Dose Route Frequency Provider Last Rate Last Dose  . acetaminophen (TYLENOL) tablet 650 mg  650 mg Oral Q6H PRN Audrea Muscat, NP   650 mg at 06/16/13 1357  . albuterol (PROVENTIL HFA;VENTOLIN HFA) 108 (  90 BASE) MCG/ACT inhaler 2 puff  2 puff Inhalation Q6H PRN Evanna Janann August, NP      . alum & mag hydroxide-simeth (MAALOX/MYLANTA) 200-200-20 MG/5ML suspension 30 mL  30 mL Oral Q4H PRN Evanna Janann August, NP      . clonazePAM (KLONOPIN) tablet 1 mg  1 mg Oral BID Audrea Muscat, NP   1 mg at 06/17/13 0754  . FLUoxetine (PROZAC) capsule 20 mg  20 mg Oral Daily Nehemiah Settle, MD   20 mg at 06/17/13 0752  . levETIRAcetam (KEPPRA) tablet 1,000 mg  1,000 mg Oral BID Audrea Muscat, NP   1,000 mg at 06/17/13 0752  .  magnesium hydroxide (MILK OF MAGNESIA) suspension 30 mL  30 mL Oral Daily PRN Evanna Janann August, NP      . metFORMIN (GLUCOPHAGE) tablet 500 mg  500 mg Oral BID WC Nehemiah Settle, MD   500 mg at 06/17/13 0752  . multivitamin with minerals tablet 1 tablet  1 tablet Oral Daily Evanna Janann August, NP   1 tablet at 06/17/13 0752  . nicotine (NICODERM CQ - dosed in mg/24 hours) patch 21 mg  21 mg Transdermal Daily Nehemiah Settle, MD   21 mg at 06/17/13 0755  . [START ON 06/21/2013] Vitamin D (Ergocalciferol) (DRISDOL) capsule 50,000 Units  50,000 Units Oral Q7 days Evanna Janann August, NP        Lab Results:  Results for orders placed during the hospital encounter of 06/16/13 (from the past 48 hour(s))  GLUCOSE, CAPILLARY     Status: None   Collection Time    06/16/13  5:53 AM      Result Value Range   Glucose-Capillary 97  70 - 99 mg/dL   Comment 1 Notify RN    POCT I-STAT, CHEM 8     Status: None   Collection Time    06/16/13  5:53 AM      Result Value Range   Sodium 140  135 - 145 mEq/L   Potassium 3.6  3.5 - 5.1 mEq/L   Chloride 102  96 - 112 mEq/L   BUN 7  6 - 23 mg/dL   Creatinine, Ser 0.98  0.50 - 1.10 mg/dL   Glucose, Bld 84  70 - 99 mg/dL   Calcium, Ion 1.19  1.47 - 1.23 mmol/L   TCO2 25  0 - 100 mmol/L   Hemoglobin 13.9  12.0 - 15.0 g/dL   HCT 82.9  56.2 - 13.0 %    Physical Findings: AIMS: Facial and Oral Movements Muscles of Facial Expression: None, normal Lips and Perioral Area: None, normal Jaw: None, normal Tongue: None, normal,Extremity Movements Upper (arms, wrists, hands, fingers): None, normal Lower (legs, knees, ankles, toes): None, normal, Trunk Movements Neck, shoulders, hips: None, normal, Overall Severity Severity of abnormal movements (highest score from questions above): None, normal Incapacitation due to abnormal movements: None, normal Patient's awareness of abnormal movements (rate only patient's report): No Awareness, Dental  Status Current problems with teeth and/or dentures?: No Does patient usually wear dentures?: No  CIWA:    COWS:     Treatment Plan Summary: Daily contact with patient to assess and evaluate symptoms and progress in treatment Medication management  Plan:  Review of chart, vital signs, medications, and notes. 1-Individual and group therapy 2-Medication management for depression and anxiety:  Medications reviewed with the patient and she stated she no untoward effects, no changes made 3-Coping skills for depression, anxiety, and  PTSD 4-Continue crisis stabilization and management 5-Address health issues--monitoring vitals signs, stable 6-Treatment plan in progress to prevent relapse of depression and anxiety  Medical Decision Making Problem Points:  Established problem, stable/improving (1) and Review of psycho-social stressors (1) Data Points:  Review of medication regiment & side effects (2)  I certify that inpatient services furnished can reasonably be expected to improve the patient's condition.   Nanine Means, PMH-NP 06/17/2013, 9:48 AM  Reviewed note, agree with findings and plan.  Jacqulyn Cane, M.D.  06/17/2013 11:29 PM

## 2013-06-17 NOTE — Progress Notes (Signed)
Patient ID: Faith [name], female   DOB: 04/26/1991, 22 y.o.   MRN: [id number] D. Patient presents with depressed mood, affect blunted. She states '' I'm just really down right now my fiancee just broke up with me, he's really insecure and accusing me of cheating on him and I don't have anywhere else to go. But I don't need to be around that negativity. '' Patient rates her depression at 8/10 on depression scale 10 being worst 1 being least depressed. Patient denies any SI/HI/A/V Hallucinations. A. Medications given as ordered. Encouraged group attendance and support and encouragement provided. Allowed patient to ventilate support given regarding relationship and encouraged /discussed healthy support systems in regards to fiancee. She denies any acute concerns at this time. R. Patient remains calm and cooperative at this time. Compliant with medications and meals. Will continue to monitor q 15 minutes for safety.

## 2013-06-17 NOTE — Progress Notes (Signed)
[facility] Group Notes:  (Nursing/MHT/Case Management/Adjunct)  Date:  06/17/2013  Time:  2000  Type of Therapy:  Psychoeducational Skills  Participation Level:  Active  Participation Quality:  Appropriate  Affect:  Appropriate  Cognitive:  Appropriate  Insight:  Improving  Engagement in Group:  Improving  Modes of Intervention:  Education  Summary of Progress/Problems: The patient mentioned that she had a better day. She states that she is feeling less depressed. In addition, she mentioned that she was able to write down some goals earlier today. Her goals for tomorrow are as follows: "shut my mouth" and work on her goals.   Hazle Coca S 06/17/2013, 10:35 PM

## 2013-06-17 NOTE — Progress Notes (Signed)
Writer was called to patients room by another RN after she was observed unsteady on her feet. Patient reported that her medications had her dizzy. Writer observed patient walking to her bathroom and was unsteady, Clinical research associate assisted her to bed after bathroom and a call bell was placed in her room and encouraged to use bell if needing to ambulate. Patient was persistent about using the phone and writer encouraged her to rest a little and staff would assist her to the phone. Writer informed patient of medications she had received on previous shift and no hs meds were scheduled for tonight. Encouraged her to speak with doctor on tomorrow if feeling that medications are too strong. Safety maintained, call bell within reach, will continue to monitor.

## 2013-06-17 NOTE — Progress Notes (Signed)
Adult Psychoeducational Group Note  Date:  06/17/2013 Time:  0900  Group Topic/Focus:  Self Inventory  Participation Level:  Minimal  Participation Quality:  Appropriate  Affect:  Appropriate  Cognitive:  Appropriate  Insight: Appropriate  Engagement in Group:  Limited  Modes of Intervention:  Discussion  Additional Comments:    Barbette Merino, Teniola Tseng Shari Prows 06/17/2013, 9:38 AM

## 2013-06-17 NOTE — [facility] Group Notes (Signed)
[facility] Group Notes:  (Nursing/MHT/Case Management/Adjunct)  Date:  06/17/2013  Time:  4:07 PM  Type of Therapy:  Psychoeducational Skills  Participation Level:  Active  Participation Quality:  Appropriate  Affect:  Appropriate  Cognitive:  Alert  Insight:  Appropriate  Engagement in Group:  Improving  Modes of Intervention:  Discussion, Education and Exploration  Summary of Progress/Problems: Healthy coping skills discussion, exploration. Malva Limes 06/17/2013, 4:07 PM

## 2013-06-17 NOTE — [facility] Group Notes (Signed)
[facility] Group Notes:  (Clinical Social Work)  06/17/2013   3:00-4:00PM  Summary of Progress/Problems:   The main focus of today's process group was for the patient to identify ways in which they have sabotaged their own mental health wellness/recovery.  Motivational interviewing was used to explore the reasons they engage in this behavior, and reasons they may have for wanting to change.  The Stages of Change were explained to the group using a handout, and patients identified where they are with regard to changing self-defeating behaviors.  The patient expressed that she did not want to talk during this group.  After group, she spoke to CSW separately to reiterate this.  She was able to acknowledge that people in group had shared things that she could relate to, and recognize that it could be helpful for her to share too.  Type of Therapy:  Process Group  Participation Level:  Minimal  Participation Quality:  Attentive  Affect:  Depressed and Flat  Cognitive:  Oriented  Insight:  Improving  Engagement in Therapy:  Engaged  Modes of Intervention:  Education, Motivational Interviewing   Ambrose Mantle, LCSW 06/17/2013, 5:19 PM

## 2013-06-18 DIAGNOSIS — F322 Major depressive disorder, single episode, severe without psychotic features: Secondary | ICD-10-CM

## 2013-06-18 MED ORDER — CLONAZEPAM 0.5 MG PO TABS: 0.5000 mg | ORAL_TABLET | Freq: Once | ORAL | Status: DC

## 2013-06-18 MED ORDER — CLONAZEPAM 0.5 MG PO TABS
ORAL_TABLET | ORAL | Status: AC
  Filled 2013-06-18: qty 1

## 2013-06-18 MED ORDER — ONDANSETRON 4 MG PO TBDP
4.0000 mg | ORAL_TABLET | Freq: Once | ORAL | Status: AC
  Administered 2013-06-18: 4 mg via ORAL
  Filled 2013-06-18: qty 1

## 2013-06-18 NOTE — Progress Notes (Signed)
Patient went to cafeteria for breakfast after writer encouraged her to rest in her room. Patient reported that she did not want to stay in her room she wanted to go to cafeteria and eat breakfast. Patient left unit for breakfast and voiced no complaints. MHT's will monitor patient in cafeteria.

## 2013-06-18 NOTE — [facility] Group Notes (Signed)
[facility] Group Notes:  (Nursing/MHT/Case Management/Adjunct)  Date:  06/18/2013  Time:  10:27 AM  Type of Therapy:  Psychoeducational Skills  Participation Level:  Active  Participation Quality:  Appropriate  Affect:  Appropriate  Cognitive:  Alert  Insight:  Improving  Engagement in Group:  Improving  Modes of Intervention:  Activity, Discussion and Education  Summary of Progress/Problems: discussion of healthy support systems - exploration of negative behavioral patterns. Patient was attentive during group and participated. Insight improving.   Malva Limes 06/18/2013, 10:27 AM

## 2013-06-18 NOTE — Progress Notes (Signed)
Writer entered patients room after MHT reported Phylliss's room mate reported that her she heard her making a clicking sound and kicking her legs. Patient's vitals were taken and lying 112/73, pulse 99, O2- 100% and respirations 18. Patient was not incontinent of urine, did not appear to be oriented, when asked if she knew where she was patient nodded her head no. Michaelle Copas RN remained in the room with her while Fransisca Kaufmann NP was called and notified of this event and order received to send patient out to ED, Michaelle Copas RN came to nursing station after leaving out of patients room and reported she was oriented and c/o pain in her chest. Fransisca Kaufmann NP called back to be informed of this information, and order resended to be sent out to the ED and order for 0.5 clonopin and maalox once be given. Patient requested to go to bathroom once writer entered room and was assisted to bathroom, gait slightly unsteady but improved on return from bathroom to bed. Writer asked if pain was better or worse and patient reported her pain was now at a 4 from the 8 she previously reported. Tylenol was offered and patient reported that she wanted to eat breakfast before taking tylenol. Patient currently sitting up in bed and reported that she could not stay in the room she needed to come out, writer encouraged patient to rest and breakfast would be brought back. Safety maintained with 15 min checks, will continue to monitor.

## 2013-06-18 NOTE — Progress Notes (Signed)
Psychoeducational Group Note  Date:  06/18/2013 Time:  2000  Group Topic/Focus:  Wrap-Up Group:   The focus of this group is to help patients review their daily goal of treatment and discuss progress on daily workbooks.  Participation Level: Did Not Attend  Participation Quality:  Not Applicable  Affect:  Not Applicable  Cognitive:  Not Applicable  Insight:  Not Applicable  Engagement in Group: Not Applicable  Additional Comments:  The patient did not attend group since she was sleeping in her room.   Hazle Coca S 06/18/2013, 9:57 PM
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
## 2013-06-18 NOTE — [facility] Group Notes (Signed)
[facility] Group Notes:  (Clinical Social Work)  06/18/2013   3:00-4:00PM  Summary of Progress/Problems:   The main focus of today's process group was to   identify the patient's current support system and decide on other supports that can be put in place.  The picture on workbook was used to discuss why additional supports are needed, and a hand-out was distributed with four definitions/levels of support, then used to talk about how patients have given and received all different kinds of support.  An emphasis was placed on using counselor, doctor, therapy groups, 12-step groups, and problem-specific support groups to expand supports.  The patient identified a great desire to speak with CSW and sat next to CSW in order to hear everything, she stated.  However, within 5 minutes of group starting she fell asleep.  CSW awakened her several times and asked if she would be more comfortable in her room, and she declined leaving the room.  Type of Therapy:  Process Group  Participation Level:  Minimal  Participation Quality:  Drowsy  Affect:  Anxious, Blunted and Depressed  Cognitive:  Oriented  Insight:  None  Engagement in Therapy:  None  Modes of Intervention:  Education,  Support and [employer], LCSW 06/18/2013, 2:39 PM
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
## 2013-06-19 LAB — GLUCOSE, CAPILLARY
Glucose-Capillary: 94 mg/dL (ref 70–99)
Glucose-Capillary: 99 mg/dL (ref 70–99)
Glucose-Capillary: 77 mg/dL (ref 70–99)
Glucose-Capillary: 112 mg/dL — ABNORMAL HIGH (ref 70–99)

## 2013-06-19 MED ORDER — VITAMIN D (ERGOCALCIFEROL) 1.25 MG (50000 UNIT) PO CAPS: 50000.0000 [IU] | ORAL_CAPSULE | ORAL | Status: DC

## 2013-06-19 MED ORDER — LEVETIRACETAM 1000 MG PO TABS: 1000.0000 mg | ORAL_TABLET | Freq: Two times a day (BID) | ORAL | Status: DC

## 2013-06-19 MED ORDER — CLONAZEPAM 1 MG PO TABS: 1.0000 mg | ORAL_TABLET | Freq: Two times a day (BID) | ORAL | Status: DC

## 2013-06-19 MED ORDER — FLUOXETINE HCL 20 MG PO CAPS: 20.0000 mg | ORAL_CAPSULE | Freq: Every day | ORAL | Status: DC

## 2013-06-19 MED ORDER — METFORMIN HCL 500 MG PO TABS: 500.0000 mg | ORAL_TABLET | Freq: Two times a day (BID) | ORAL | Status: DC

## 2013-06-19 NOTE — BHH Group Notes (Signed)
.  [facility] LCSW Aftercare Discharge Planning Group Note   06/19/2013 8:45 AM  Participation Quality:  Alert and Appropriate   Mood/Affect:  Appropriate and Bright  Depression Rating:  2  Anxiety Rating:  0  Thoughts of Suicide:  Pt denies SI/HI  Will you contract for safety?   Yes  Current AVH:  Pt denies  Plan for Discharge/Comments:  Pt attended discharge planning group and actively participated in group.  CSW provided pt with today's workbook.  Pt reports feeling stable to d/c today.  Pt states that she lives in [place] with her fiance and can return home there.  Pt will follow up at [facility] in [place] for medication management and therapy and community support team. No further needs voiced by pt at this time.    Transportation Means: Pt reports access to transportation - pt states her fiance will pick her up  Supports: No supports mentioned at this time  Reyes Ivan, LCSWA 06/19/2013 9:52 AM

## 2013-06-19 NOTE — Discharge Summary (Signed)
Physician Discharge Summary Note  Patient:  Faith [name] is an 22 y.o., female MRN:  [id number] DOB:  [birth year] Patient phone:  [phone] (home)  Patient address:   [address] [place] [address],   Date of Admission:  06/15/2013 Date of Discharge: 06/19/2013   Reason for Admission:  Worsening depression and suicidal ideation  Discharge Diagnoses: Principal Problem:   Suicidal ideation Active Problems:   Depression   Post traumatic stress disorder (PTSD)  ROS  DSM5: DSM5:  Schizophrenia Disorders:  Obsessive-Compulsive Disorders:  Trauma-Stressor Disorders: Posttraumatic Stress Disorder (309.81)  Substance/Addictive Disorders:  Depressive Disorders: Major Depressive Disorder - Severe (296.23)  AXIS I: Major Depression, Recurrent severe and Post Traumatic Stress Disorder  AXIS II: Deferred  AXIS III:  Past Medical History   Diagnosis  Date   .  Asthma    .  Seizures    .  Diabetes mellitus without complication    .  Tubal pregnancy    .  Heart murmur    AXIS IV: economic problems, occupational problems, other psychosocial or environmental problems and problems related to social environment  AXIS V: 41-50 serious symptoms  Level of Care:  OP  Hospital Course:   Faith [name] was admitted voluntarily from the [facility] where she presented having had a seizure. She reported increasing symptoms of depression and suicidal ideation and was not able to contract for safety. She reported stopping her medication 3 days prior but could not give a reason why. She stated multiple stressors an symptoms related to her depression.        Upon arrival at the unit she was evaluated and her symptoms were identified. Medication management was initiated and Faith [name] was encouraged to participate in unit programming. Initially she was reluctant to participate in group and did not want to talk.          Faith [name] was extremely dependent upon her roommate and strict boundaries were set  regarding being responsible for herself. She responded well to a therapeutic environment and reported no side effects to her medication.        By the day of discharge she was in much better condition than upon arrival and denied SI/HI and AVH. She was discharged out with plans to follow up as noted below. Consults:  None  Significant Diagnostic Studies:  labs: CBC, CMP, UA, UDS  Discharge Vitals:   Blood pressure 105/66, pulse 98, temperature 97.8 F (36.6 C), temperature source Oral, resp. rate 18, height 5' 1.5" (1.562 m), weight 93.441 kg (206 lb), last menstrual period 06/10/2013, SpO2 100.00%. Body mass index is 38.3 kg/(m^2). Lab Results:   No results found for this or any previous visit (from the past 72 hour(s)).  Physical Findings: AIMS: Facial and Oral Movements Muscles of Facial Expression: None, normal Lips and Perioral Area: None, normal Jaw: None, normal Tongue: None, normal,Extremity Movements Upper (arms, wrists, hands, fingers): None, normal Lower (legs, knees, ankles, toes): None, normal, Trunk Movements Neck, shoulders, hips: None, normal, Overall Severity Severity of abnormal movements (highest score from questions above): None, normal Incapacitation due to abnormal movements: None, normal Patient's awareness of abnormal movements (rate only patient's report): No Awareness, Dental Status Current problems with teeth and/or dentures?: No Does patient usually wear dentures?: No  CIWA:    COWS:     Psychiatric Specialty Exam: See Psychiatric Specialty Exam and Suicide Risk Assessment completed by Attending Physician prior to discharge.  Discharge destination:  Home  Is patient on multiple antipsychotic therapies  at discharge:  No   Has Patient had three or more failed trials of antipsychotic monotherapy by history:  No  Recommended Plan for Multiple Antipsychotic Therapies: NA  Discharge Orders   Future Appointments Provider Department Dept Phone    06/26/2013 11:45 AM Tilda Burrow, MD [facility] [phone]   Future Orders Complete By Expires   Diet - low sodium heart healthy  As directed    Discharge instructions  As directed    Comments:     Take all of your medications as directed. Be sure to keep all of your follow up appointments.  If you are unable to keep your follow up appointment, call your Doctor's office to let them know, and reschedule.  Make sure that you have enough medication to last until your appointment. Be sure to get plenty of rest. Going to bed at the same time each night will help. Try to avoid sleeping during the day.  Increase your activity as tolerated. Regular exercise will help you to sleep better and improve your mental health. Eating a heart healthy diet is recommended. Try to avoid salty or fried foods. Be sure to avoid all alcohol and illegal drugs.   Increase activity slowly  As directed        Medication List    STOP taking these medications       acetaminophen 500 MG tablet  Commonly known as:  TYLENOL     LORazepam 2 MG/ML concentrated solution  Commonly known as:  ATIVAN     multivitamin with minerals Tabs tablet     mupirocin cream 2 %  Commonly known as:  BACTROBAN     oxyCODONE-acetaminophen 5-325 MG per tablet  Commonly known as:  PERCOCET/ROXICET      TAKE these medications     Indication   albuterol 108 (90 BASE) MCG/ACT inhaler  Commonly known as:  PROVENTIL HFA;VENTOLIN HFA  Inhale 2 puffs into the lungs every 6 (six) hours as needed. For shortness of breath      clonazePAM 1 MG tablet  Commonly known as:  KLONOPIN  Take 1 tablet (1 mg total) by mouth 2 (two) times daily. For anxiety and panic.   Indication:  Panic Disorder     FLUoxetine 20 MG capsule  Commonly known as:  PROZAC  Take 1 capsule (20 mg total) by mouth daily. For anxiety and depression.   Indication:  Depression     levETIRAcetam 1000 MG tablet  Commonly known as:  KEPPRA  Take 1 tablet  (1,000 mg total) by mouth 2 (two) times daily. For seizures.   Indication:  Seizure Prophylaxis following Subarachnoid Hemorrhage     metFORMIN 500 MG tablet  Commonly known as:  GLUCOPHAGE  Take 1 tablet (500 mg total) by mouth 2 (two) times daily with a meal. For type 2 diabetes.   Indication:  Type 2 Diabetes     Vitamin D (Ergocalciferol) 50000 UNITS Caps capsule  Commonly known as:  DRISDOL  Take 1 capsule (50,000 Units total) by mouth every 7 (seven) days. Takes Wednesday for low vitamin D.   Indication:  Vitamin D Deficiency           Follow-up Information   Follow up with [facility] On 06/21/2013. (Appointment scheduled at 8:00 am for hospital discharge appointment, for medication management and therapy.  [employer] Team will also be in contact with you.)    Contact information:   [name] [place], [place] [address] Phone: [phone]  Fax: [phone]      Follow-up recommendations:   Activities: Resume activity as tolerated. Diet: Heart healthy low sodium diet Tests: Follow up testing will be determined by your out patient provider.  Comments:    Total Discharge Time:  Greater than 30 minutes.  Signed: Rona Ravens. Mashburn RPAC 11:04 AM 06/19/2013  Patient is personally examined and performed suicidal risk assessment. Case discussed with treatment team and developed discharge plan. Reviewed the information documented and agree with the treatment plan.  Dorota Heinrichs,JANARDHAHA R. 06/19/2013 6:02 PM

## 2013-06-19 NOTE — BHH Suicide Risk Assessment (Signed)
Suicide Risk Assessment  Discharge Assessment     Demographic Factors:  Adolescent or young adult, Caucasian, Low socioeconomic status and Unemployed  Mental Status Per Nursing Assessment::   On Admission:  NA  Current Mental Status by Physician: Mental Status Examination: Patient appeared as per his stated age, casually dressed, and fairly groomed, and maintaining good eye contact. Patient has good mood and his affect was constricted. He has normal rate, rhythm, and volume of speech. His thought process is linear and goal directed. Patient has denied suicidal, homicidal ideations, intentions or plans. Patient has no evidence of auditory or visual hallucinations, delusions, and paranoia. Patient has fair insight judgment and impulse control.  Loss Factors: Decline in physical health and Financial problems/change in socioeconomic status  Historical Factors: Prior suicide attempts and Impulsivity  Risk Reduction Factors:   Sense of responsibility to family, Religious beliefs about death, Living with another person, especially a relative, Positive social support, Positive therapeutic relationship and Positive coping skills or problem solving skills  Continued Clinical Symptoms:  Severe Anxiety and/or Agitation Depression:   Recent sense of peace/wellbeing Epilepsy Unstable or Poor Therapeutic Relationship Previous Psychiatric Diagnoses and Treatments Medical Diagnoses and Treatments/Surgeries  Cognitive Features That Contribute To Risk:  Polarized thinking    Suicide Risk:  Minimal: No identifiable suicidal ideation.  Patients presenting with no risk factors but with morbid ruminations; may be classified as minimal risk based on the severity of the depressive symptoms  Discharge Diagnoses:   AXIS I:  Major Depression, Recurrent severe and Post Traumatic Stress Disorder AXIS II:  Cluster B Traits AXIS III:   Past Medical History  Diagnosis Date  . Asthma   . Seizures   .  Diabetes mellitus without complication   . Tubal pregnancy   . Heart murmur    AXIS IV:  other psychosocial or environmental problems, problems related to social environment and problems with primary support group AXIS V:  61-70 mild symptoms  Plan Of Care/Follow-up recommendations:  Activity:  As tolerated Diet:  Regular  Is patient on multiple antipsychotic therapies at discharge:  No   Has Patient had three or more failed trials of antipsychotic monotherapy by history:  No  Recommended Plan for Multiple Antipsychotic Therapies: NA  Shreshta Medley,JANARDHAHA R. 06/19/2013, 11:36 AM

## 2013-06-19 NOTE — Progress Notes (Signed)
D/C instructions/meds/follow-up appointments reviewed, pt verbalized understanding, pt's belongings returned to pt, denies SI/HI/AVH, samples given.

## 2013-06-19 NOTE — [facility] Suicide Risk Assessment (Signed)
[facility] INPATIENT:  Family/Significant Other Suicide Prevention Education  Suicide Prevention Education:  Education Completed; Guy Sandifer - fiance [phone]),  (name of family member/significant other) has been identified by the patient as the family member/significant other with whom the patient will be residing, and identified as the person(s) who will aid the patient in the event of a mental health crisis (suicidal ideations/suicide attempt).  With written consent from the patient, the family member/significant other has been provided the following suicide prevention education, prior to the and/or following the discharge of the patient.  The suicide prevention education provided includes the following:  Suicide risk factors  Suicide prevention and interventions  National Suicide Hotline telephone number  [facility] assessment telephone number  [facility] Emergency Assistance 911  [facility] and/or Residential Mobile Crisis Unit telephone number  Request made of family/significant other to:  Remove weapons (e.g., guns, rifles, knives), all items previously/currently identified as safety concern.    Remove drugs/medications (over-the-counter, prescriptions, illicit drugs), all items previously/currently identified as a safety concern.  The family member/significant other verbalizes understanding of the suicide prevention education information provided.  The family member/significant other agrees to remove the items of safety concern listed above.  Carmina Miller 06/19/2013, 11:39 AM

## 2013-06-19 NOTE — Progress Notes (Signed)
[facility] Adult Case Management Discharge Plan :  Will you be returning to the same living situation after discharge: Yes,  returning home At discharge, do you have transportation home?:Yes,  family will pick pt up Do you have the ability to pay for your medications:Yes,  access to meds  Release of information consent forms completed and in the chart;  Patient's signature needed at discharge.  Patient to Follow up at: Follow-up Information   Follow up with [facility] On 06/21/2013. (Appointment scheduled at 8:00 am for hospital discharge appointment, for medication management and therapy.  [employer] Team will also be in contact with you.)    Contact information:   [address] [place], [place] [address] Phone: [phone] Fax: [phone]      Patient denies SI/HI:   Yes,  denies SI/HI\    Safety Planning and Suicide Prevention discussed:  Yes,  discussed with pt and pt's fiance.  See suicide prevention education note.   Faith [name] 06/19/2013, 11:40 AM

## 2013-06-19 NOTE — Tx Team (Signed)
Interdisciplinary Treatment Plan Update (Adult)  Date: 06/19/2013  Time Reviewed:  9:45 AM  Progress in Treatment: Attending groups: Yes Participating in groups:  Yes Taking medication as prescribed:  Yes Tolerating medication:  Yes Family/Significant othe contact made: Yes Patient understands diagnosis:  Yes Discussing patient identified problems/goals with staff:  Yes Medical problems stabilized or resolved:  Yes Denies suicidal/homicidal ideation: Yes Issues/concerns per patient self-inventory:  Yes Other:  New problem(s) identified: N/A  Discharge Plan or Barriers: Pt will follow up at [facility] in [place] for medication management, therapy and CST.    Reason for Continuation of Hospitalization: Stable to d/c today  Comments: N/A  Estimated length of stay: D/C today  For review of initial/current patient goals, please see plan of care.  Attendees: Patient:  Faith [name]  06/19/2013 10:30 AM  Family:     Physician:  Dr. Javier Glazier 06/19/2013 12:40 PM   Nursing:   Neill Loft, RN 06/19/2013 12:40 PM   Clinical Social Worker:  Reyes Ivan, LCSWA 06/19/2013 12:40 PM   Other: Verne Spurr, PA 06/19/2013 12:40 PM   Other:   Other:    Other:     Other:    Other:    Other:    Other:    Other:    Other:     Scribe for Treatment Team:   Carmina Miller, 06/19/2013 12:40 PM

## 2013-06-21 NOTE — Progress Notes (Signed)
Patient Discharge Instructions:  After Visit Summary (AVS):   Faxed to:  06/21/13 Discharge Summary Note:   Faxed to:  06/21/13 Psychiatric Admission Assessment Note:   Faxed to:  06/21/13 Suicide Risk Assessment - Discharge Assessment:   Faxed to:  06/21/13 Faxed/Sent to the Next Level Care provider:  06/21/13 Faxed to [facility] @ [phone]  Jerelene Redden, 06/21/2013, 3:38 PM

## 2013-06-22 NOTE — ED Provider Notes (Signed)
Medical screening examination/treatment/procedure(s) were performed by non-physician practitioner and as supervising physician I was immediately available for consultation/collaboration.   Toy Baker, MD 06/22/13 [phone]

## 2013-06-26 ENCOUNTER — Encounter: Payer: Self-pay | Admitting: Obstetrics and Gynecology

## 2013-07-14 ENCOUNTER — Telehealth (HOSPITAL_COMMUNITY): Payer: Self-pay | Admitting: Licensed Clinical Social Worker

## 2013-07-14 ENCOUNTER — Emergency Department (HOSPITAL_COMMUNITY): Payer: Medicaid Other

## 2013-07-14 ENCOUNTER — Emergency Department (HOSPITAL_COMMUNITY)
Admission: EM | Admit: 2013-07-14 | Discharge: 2013-07-14 | Disposition: A | Discharge status: Home / Self Care | Payer: Medicaid Other | Attending: Emergency Medicine | Admitting: Emergency Medicine

## 2013-07-14 DIAGNOSIS — F3289 Other specified depressive episodes: Secondary | ICD-10-CM | POA: Insufficient documentation

## 2013-07-14 DIAGNOSIS — R011 Cardiac murmur, unspecified: Secondary | ICD-10-CM | POA: Insufficient documentation

## 2013-07-14 DIAGNOSIS — E119 Type 2 diabetes mellitus without complications: Secondary | ICD-10-CM | POA: Insufficient documentation

## 2013-07-14 DIAGNOSIS — N390 Urinary tract infection, site not specified: Secondary | ICD-10-CM

## 2013-07-14 DIAGNOSIS — R51 Headache: Secondary | ICD-10-CM | POA: Insufficient documentation

## 2013-07-14 DIAGNOSIS — G40909 Epilepsy, unspecified, not intractable, without status epilepticus: Secondary | ICD-10-CM

## 2013-07-14 DIAGNOSIS — J45909 Unspecified asthma, uncomplicated: Secondary | ICD-10-CM | POA: Insufficient documentation

## 2013-07-14 DIAGNOSIS — Z91040 Latex allergy status: Secondary | ICD-10-CM | POA: Insufficient documentation

## 2013-07-14 DIAGNOSIS — Z79899 Other long term (current) drug therapy: Secondary | ICD-10-CM | POA: Insufficient documentation

## 2013-07-14 DIAGNOSIS — Z3202 Encounter for pregnancy test, result negative: Secondary | ICD-10-CM | POA: Insufficient documentation

## 2013-07-14 DIAGNOSIS — F329 Major depressive disorder, single episode, unspecified: Secondary | ICD-10-CM | POA: Insufficient documentation

## 2013-07-14 DIAGNOSIS — F17200 Nicotine dependence, unspecified, uncomplicated: Secondary | ICD-10-CM | POA: Insufficient documentation

## 2013-07-14 LAB — COMPREHENSIVE METABOLIC PANEL
AST: 13 U/L (ref 0–37)
Albumin: 3.7 g/dL (ref 3.5–5.2)
Alkaline Phosphatase: 85 U/L (ref 39–117)
Chloride: 98 mEq/L (ref 96–112)
Potassium: 3.5 mEq/L (ref 3.5–5.1)
Sodium: 133 mEq/L — ABNORMAL LOW (ref 135–145)
Total Bilirubin: 0.2 mg/dL — ABNORMAL LOW (ref 0.3–1.2)
Total Protein: 7.5 g/dL (ref 6.0–8.3)

## 2013-07-14 LAB — CBC WITH DIFFERENTIAL/PLATELET
Basophils Absolute: 0 10*3/uL (ref 0.0–0.1)
Basophils Relative: 0 % (ref 0–1)
Hemoglobin: 11.8 g/dL — ABNORMAL LOW (ref 12.0–15.0)
MCHC: 32.2 g/dL (ref 30.0–36.0)
Neutro Abs: 11.5 10*3/uL — ABNORMAL HIGH (ref 1.7–7.7)
Neutrophils Relative %: 74 % (ref 43–77)
Platelets: 381 10*3/uL (ref 150–400)
RDW: 15.5 % (ref 11.5–15.5)

## 2013-07-14 LAB — RAPID URINE DRUG SCREEN, HOSP PERFORMED
Amphetamines: NOT DETECTED
Benzodiazepines: NOT DETECTED
Cocaine: NOT DETECTED

## 2013-07-14 LAB — URINALYSIS, ROUTINE W REFLEX MICROSCOPIC
Bilirubin Urine: NEGATIVE
Glucose, UA: NEGATIVE mg/dL
Ketones, ur: 15 mg/dL — AB
Specific Gravity, Urine: 1.025 (ref 1.005–1.030)
pH: 5 (ref 5.0–8.0)

## 2013-07-14 LAB — URINE MICROSCOPIC-ADD ON

## 2013-07-14 LAB — GLUCOSE, CAPILLARY: Glucose-Capillary: 96 mg/dL (ref 70–99)

## 2013-07-14 MED ORDER — CEPHALEXIN 500 MG PO CAPS: 500.0000 mg | ORAL_CAPSULE | Freq: Four times a day (QID) | ORAL | Status: DC

## 2013-07-14 MED ORDER — METFORMIN HCL 500 MG PO TABS: 500.0000 mg | ORAL_TABLET | Freq: Two times a day (BID) | ORAL | Status: DC

## 2013-07-14 MED ORDER — FLUOXETINE HCL 20 MG PO CAPS
20.0000 mg | ORAL_CAPSULE | Freq: Every day | ORAL | Status: DC
  Administered 2013-07-14: 20 mg via ORAL
  Filled 2013-07-14 (×2): qty 1

## 2013-07-14 MED ORDER — CLONAZEPAM 0.5 MG PO TABS
1.0000 mg | ORAL_TABLET | Freq: Two times a day (BID) | ORAL | Status: DC
  Administered 2013-07-14: 1 mg via ORAL
  Filled 2013-07-14 (×2): qty 2

## 2013-07-14 MED ORDER — SODIUM CHLORIDE 0.9 % IV SOLN
500.0000 mg | Freq: Once | INTRAVENOUS | Status: AC
  Administered 2013-07-14: 500 mg via INTRAVENOUS
  Filled 2013-07-14: qty 5

## 2013-07-14 MED ORDER — LEVETIRACETAM 500 MG/5ML IV SOLN
INTRAVENOUS | Status: AC
  Filled 2013-07-14: qty 5

## 2013-07-14 MED ORDER — LORAZEPAM 2 MG/ML IJ SOLN
1.0000 mg | Freq: Once | INTRAMUSCULAR | Status: AC
  Administered 2013-07-14: 1 mg via INTRAVENOUS

## 2013-07-14 MED ORDER — SODIUM CHLORIDE 0.9 % IV BOLUS (SEPSIS)
1000.0000 mL | Freq: Once | INTRAVENOUS | Status: AC
  Administered 2013-07-14: 1000 mL via INTRAVENOUS

## 2013-07-14 MED ORDER — INSULIN ASPART 100 UNIT/ML ~~LOC~~ SOLN: 0.0000 [IU] | Freq: Three times a day (TID) | SUBCUTANEOUS | Status: DC

## 2013-07-14 MED ORDER — LEVETIRACETAM 500 MG PO TABS
1000.0000 mg | ORAL_TABLET | Freq: Two times a day (BID) | ORAL | Status: DC
  Administered 2013-07-14: 1000 mg via ORAL
  Filled 2013-07-14 (×3): qty 2

## 2013-07-14 NOTE — ED Provider Notes (Signed)
CSN: [id number]     Arrival date & time 07/14/13  0051 History   First MD Initiated Contact with Patient 07/14/13 0106     Chief Complaint  Patient presents with  . Seizures    witnessed seizure via friend  . Headache   (Consider location/radiation/quality/duration/timing/severity/associated sxs/prior Treatment) HPI Comments: ) By EMS after apparently having a witnessed seizure episode. Patient is post ictal and not able to contribute much to the history. She does have a history of seizure disorder on Keppra. She states she's been compliant with it. She denies any fever, recent illnesses. She is not sure her last seizure was. Review shows she was admitted for status epilepticus last month. She is a good by mouth intake and urine output.  The history is provided by the patient and the EMS personnel.    Past Medical History  Diagnosis Date  . Asthma   . Seizures   . Diabetes mellitus without complication   . Tubal pregnancy   . Heart murmur    Past Surgical History  Procedure Laterality Date  . Dilation and curettage of uterus    . Ovarian cyst removed    . Tonsillectomy    . Tonsillectomy and adenoidectomy     No family history on file. History  Substance Use Topics  . Smoking status: Current Every Day Smoker -- 0.50 packs/day for 3 years    Types: Cigarettes  . Smokeless tobacco: Not on file  . Alcohol Use: No     Comment: occ   OB History   Grav Para Term Preterm Abortions TAB SAB Ect Mult Living                 Review of Systems  Unable to perform ROS: Mental status change  Neurological: Positive for seizures and headaches.    Allergies  Morphine and related; Tomato; Toradol; Iohexol; Lamictal; Sulfa antibiotics; Latex; Mushroom extract complex; and Tramadol  Home Medications   Current Outpatient Rx  Name  Route  Sig  Dispense  Refill  . albuterol (PROVENTIL HFA;VENTOLIN HFA) 108 (90 BASE) MCG/ACT inhaler   Inhalation   Inhale 2 puffs into the lungs every  6 (six) hours as needed. For shortness of breath         . clonazePAM (KLONOPIN) 1 MG tablet   Oral   Take 1 tablet (1 mg total) by mouth 2 (two) times daily. For anxiety and panic.   60 tablet   0   . FLUoxetine (PROZAC) 20 MG capsule   Oral   Take 1 capsule (20 mg total) by mouth daily. For anxiety and depression.   30 capsule   0   . levETIRAcetam (KEPPRA) 1000 MG tablet   Oral   Take 1 tablet (1,000 mg total) by mouth 2 (two) times daily. For seizures.   60 tablet   0   . metFORMIN (GLUCOPHAGE) 500 MG tablet   Oral   Take 1 tablet (500 mg total) by mouth 2 (two) times daily with a meal. For type 2 diabetes.         . Vitamin D, Ergocalciferol, (DRISDOL) 50000 UNITS CAPS capsule   Oral   Take 1 capsule (50,000 Units total) by mouth every 7 (seven) days. Takes Wednesday for low vitamin D.   30 capsule       BP 91/26  Pulse 88  Temp(Src) 98.5 F (36.9 C) (Oral)  Resp 22  Ht 5' (1.524 m)  Wt 190 lb (86.183 kg)  BMI 37.11 kg/m2  SpO2 96%  LMP 07/14/2013 Physical Exam  Constitutional: She appears well-developed and well-nourished. No distress.  HENT:  Head: Normocephalic and atraumatic.  Mouth/Throat: Oropharynx is clear and moist. No oropharyngeal exudate.  Eyes: Conjunctivae and EOM are normal. Pupils are equal, round, and reactive to light.  Neck: Normal range of motion. Neck supple.  Cardiovascular: Normal rate, regular rhythm and normal heart sounds.   No murmur heard. Pulmonary/Chest: Effort normal and breath sounds normal. No respiratory distress.  Abdominal: There is no tenderness. There is no rebound and no guarding.  Musculoskeletal: Normal range of motion. She exhibits no edema and no tenderness.  Neurological: She is alert. No cranial nerve deficit. She exhibits normal muscle tone. Coordination normal.  Somnolent, arousable. Follows commands. Cranial nerves 2-12 intact. 5 out of 5 strength throughout.  Skin: Skin is warm.    ED Course   Procedures (including critical care time) Labs Review Labs Reviewed  CBC WITH DIFFERENTIAL - Abnormal; Notable for the following:    WBC 15.4 (*)    Hemoglobin 11.8 (*)    MCV 72.9 (*)    MCH 23.5 (*)    Neutro Abs 11.5 (*)    All other components within normal limits  COMPREHENSIVE METABOLIC PANEL - Abnormal; Notable for the following:    Sodium 133 (*)    Total Bilirubin 0.2 (*)    All other components within normal limits  GLUCOSE, CAPILLARY  HCG, SERUM, QUALITATIVE  PREGNANCY, URINE  URINALYSIS, ROUTINE W REFLEX MICROSCOPIC  URINE RAPID DRUG SCREEN (HOSP PERFORMED)   Imaging Review Ct Head Wo Contrast  07/14/2013   *RADIOLOGY REPORT*  Clinical Data: Seizures, headache.  CT HEAD WITHOUT CONTRAST  Technique:  Contiguous axial images were obtained from the base of the skull through the vertex without contrast.  Comparison: CT of head May 29, 2013  Findings: The ventricles and sulci are normal.  No intraparenchymal hemorrhage, mass effect nor midline shift.  No acute large vascular territory infarcts.  No abnormal extra-axial fluid collections.  Basal cisterns are patent.  No skull fracture.  Visualized paranasal sinuses and mastoid aircells are well-aerated. Hypoplastic frontal sinuses.  The included ocular globes and orbital contents are non-suspicious.  IMPRESSION: No acute intracranial process; normal noncontrast CT of the head, stable from May 29, 2013.   Original Report Authenticated By: Awilda Metro    EKG Interpretation   None       MDM   1. Seizure disorder   2. Depression    Patient from home with apparent witnessed seizure activity. History of seizure disorder on Keppra. Klonopin is also on her medication list.  On arrival she appears postictal and appear to be having a pseudoseizure activity with twitching of her face and eyes but she was alert and answering questions. No evidence of trauma. No meningismus.  EEG during last hospitalization was  unremarkable. CT head negative. Given ativan and keppra.  No true seizure activity in ED.  Patient awake and alert, oriented.  She states she has been taking both her keppra and klonopin.  No evidence of benzo withdrawal as source of alleged seizure.  On attempt discharge, patient states she is feeling depressed and has a "little" thought of hurting herself.  No plan.  History of depression with suicide attempts in past, [facility] admission last month reviewed. Will have psychiatry evaluate.  Glynn Octave, MD 07/14/13 [phone]

## 2013-07-14 NOTE — ED Notes (Signed)
Pt resting in bed at this time. Pt cooperating at this time and will stay until hears further evaluation from psychiatrists. Pt has changed in scrubs and sitter at bsd.

## 2013-07-14 NOTE — BH Assessment (Addendum)
Writer contacted patient's nurse-Amanda. Scheduled a TA for 1030.   *Staff under the impression that their was an already scheduled appointment time for 0900 this morning.  Writer not aware of a already 0900 scheduled time nor was this time documented on the daily log.  Staff state that this information was documented. Writer will complete patients assessment asap 1030 this am.

## 2013-07-14 NOTE — ED Notes (Signed)
telepysch scheduled for 1030a

## 2013-07-14 NOTE — ED Notes (Signed)
Pt resting in bed with eyes closed. resp even/nonlabored. nad noted. Sitter at bsd.

## 2013-07-14 NOTE — ED Notes (Signed)
Patient refused to urinate and to have in and out cath

## 2013-07-14 NOTE — BHH Counselor (Signed)
This Clinical research associate staffed pt with Verne Spurr, NP, pt ok for d/c/  Pt has outpatient services with a psychiatrist/therapist in [place].  This Clinical research associate contacted Dr. Linwood Dibbles, agreed with disposition.  Pt will be d/c'd home, safety contract faxed to [facility] [phone].  This Clinical research associate communicated information to Alfredo Martinez, Charity fundraiser.

## 2013-07-14 NOTE — ED Notes (Signed)
Patient resting quietly. Waiting on assessment from psychiatry

## 2013-07-14 NOTE — ED Notes (Signed)
Pt refused to change into scrubs. Pt got out of bed and stated she was leaving. Pt to be IVC'd if attempting to leave, per EDP, [facility] called. Pt had telepsych but has not yet informed us of recommendations.  Called [facility] and was told pt may be able to be discharged home pending psychiatrists approval. Exlained to [facility] that pt was being very uncooperative. [facility] then spoke with EDP. EDP in to speak with pt. Pt agreed to stay until decision is made by psychiatrist.[facility] then left.

## 2013-07-14 NOTE — ED Notes (Signed)
Pt was given one 5 min phone call. Pt called her fiance to come up to ER to take her home. Fiance called and I spoke with him and stated pt was not for dc at this time. Pt states she is leaving. Dr. Lynelle Doctor aware and pt still awaiting for pysch consult after expressing thoughts of wanting to her hurt herself. Pt aware of consult and is cooperating at this time Pt requesting IV to be out. Per Dr. Lynelle Doctor pt ok not to have IV access. bedrails padded for seizure precaution and pt still currently reading NSR on tele. Sitter at [facility]sd.
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
## 2013-07-14 NOTE — ED Notes (Signed)
Patient now stating she is drepressed and has had thoughts of harming herself. Dr Manus Gunning aware

## 2013-07-14 NOTE — ED Notes (Signed)
Breakfast tray given. °

## 2013-07-14 NOTE — ED Notes (Signed)
nad noted prior to dc. Dc instructions reviewed along with 1 script. Pt voiced understanding.

## 2013-07-14 NOTE — ED Provider Notes (Addendum)
Filed Vitals:   07/14/13 0615  BP:   Pulse: 78  Temp:   Resp: 21   Stable this am.  Awaiting psych assessment.  Celene Kras, MD 07/14/13 [phone]  UA suggests UTI.  Pt was assessed by [facility].  Pt is stable for outpatient follow up.  Celene Kras, MD 07/14/13 [phone]

## 2013-07-15 LAB — URINE CULTURE

## 2013-07-26 ENCOUNTER — Emergency Department: Payer: Self-pay | Admitting: Emergency Medicine

## 2013-07-26 LAB — DRUG SCREEN, URINE
Barbiturates, Ur Screen: NEGATIVE (ref ?–200)
Benzodiazepine, Ur Scrn: NEGATIVE (ref ?–200)
Cannabinoid 50 Ng, Ur ~~LOC~~: NEGATIVE (ref ?–50)
MDMA (Ecstasy)Ur Screen: NEGATIVE (ref ?–500)
Opiate, Ur Screen: NEGATIVE (ref ?–300)
Tricyclic, Ur Screen: NEGATIVE (ref ?–1000)

## 2013-07-26 LAB — CBC WITH DIFFERENTIAL/PLATELET
Basophil #: 0.1 10*3/uL (ref 0.0–0.1)
Basophil %: 0.5 %
Eosinophil #: 0.2 10*3/uL (ref 0.0–0.7)
HCT: 36.9 % (ref 35.0–47.0)
Lymphocyte %: 18.1 %
MCHC: 32.1 g/dL (ref 32.0–36.0)
Monocyte #: 1.2 x10 3/mm — ABNORMAL HIGH (ref 0.2–0.9)
Monocyte %: 6.8 %
Neutrophil %: 73.7 %
Platelet: 388 10*3/uL (ref 150–440)
RBC: 5.17 10*6/uL (ref 3.80–5.20)
WBC: 18.1 10*3/uL — ABNORMAL HIGH (ref 3.6–11.0)

## 2013-07-26 LAB — URINALYSIS, COMPLETE
Glucose,UR: NEGATIVE mg/dL (ref 0–75)
Ketone: NEGATIVE
Nitrite: NEGATIVE
Ph: 5 (ref 4.5–8.0)
Protein: NEGATIVE
Specific Gravity: 1.03 (ref 1.003–1.030)
Squamous Epithelial: 43

## 2013-07-26 LAB — BASIC METABOLIC PANEL
Anion Gap: 7 (ref 7–16)
BUN: 11 mg/dL (ref 7–18)
Calcium, Total: 8.9 mg/dL (ref 8.5–10.1)
Chloride: 106 mmol/L (ref 98–107)
Co2: 26 mmol/L (ref 21–32)
EGFR (African American): >60
Potassium: 3.6 mmol/L (ref 3.5–5.1)
Sodium: 139 mmol/L (ref 136–145)

## 2013-07-26 LAB — HCG, QUANTITATIVE, PREGNANCY: Beta Hcg, Quant.: <1 m[IU]/mL — ABNORMAL LOW

## 2013-07-27 ENCOUNTER — Emergency Department: Payer: Self-pay | Admitting: Emergency Medicine

## 2013-07-28 LAB — COMPREHENSIVE METABOLIC PANEL
Albumin: 3.3 g/dL — ABNORMAL LOW (ref 3.4–5.0)
Alkaline Phosphatase: 76 U/L (ref 50–136)
Anion Gap: 5 — ABNORMAL LOW (ref 7–16)
BUN: 7 mg/dL (ref 7–18)
Bilirubin,Total: 0.2 mg/dL (ref 0.2–1.0)
Calcium, Total: 8.4 mg/dL — ABNORMAL LOW (ref 8.5–10.1)
Chloride: 107 mmol/L (ref 98–107)
Co2: 26 mmol/L (ref 21–32)
Osmolality: 273 (ref 275–301)
Potassium: 3.8 mmol/L (ref 3.5–5.1)
SGOT(AST): 20 U/L (ref 15–37)

## 2013-07-28 LAB — CBC WITH DIFFERENTIAL/PLATELET
Basophil %: 0.7 %
Eosinophil #: 0.3 10*3/uL (ref 0.0–0.7)
Eosinophil %: 2.5 %
HGB: 11.3 g/dL — ABNORMAL LOW (ref 12.0–16.0)
Lymphocyte %: 20.9 %
MCHC: 31.8 g/dL — ABNORMAL LOW (ref 32.0–36.0)
MCV: 72 fL — ABNORMAL LOW (ref 80–100)
Monocyte #: 0.8 x10 3/mm (ref 0.2–0.9)
Neutrophil %: 69.5 %
RBC: 4.97 10*6/uL (ref 3.80–5.20)

## 2013-07-30 ENCOUNTER — Inpatient Hospital Stay: Payer: Self-pay | Admitting: Family Medicine

## 2013-07-30 ENCOUNTER — Emergency Department: Payer: Self-pay | Admitting: Emergency Medicine

## 2013-07-30 LAB — CBC
HCT: 34.2 % — ABNORMAL LOW (ref 35.0–47.0)
HGB: 10.9 g/dL — ABNORMAL LOW (ref 12.0–16.0)
Platelet: 344 10*3/uL (ref 150–440)

## 2013-07-30 LAB — URINALYSIS, COMPLETE
Bilirubin,UR: NEGATIVE
Leukocyte Esterase: NEGATIVE
Nitrite: NEGATIVE
Ph: 8 (ref 4.5–8.0)
RBC,UR: 227 /HPF (ref 0–5)
Specific Gravity: 1.003 (ref 1.003–1.030)
Squamous Epithelial: 1

## 2013-07-30 LAB — COMPREHENSIVE METABOLIC PANEL
Albumin: 3.3 g/dL — ABNORMAL LOW (ref 3.4–5.0)
Alkaline Phosphatase: 76 U/L (ref 50–136)
Anion Gap: 5 — ABNORMAL LOW (ref 7–16)
BUN: 5 mg/dL — ABNORMAL LOW (ref 7–18)
Bilirubin,Total: 0.5 mg/dL (ref 0.2–1.0)
Calcium, Total: 8.6 mg/dL (ref 8.5–10.1)
Chloride: 108 mmol/L — ABNORMAL HIGH (ref 98–107)
Creatinine: 0.75 mg/dL (ref 0.60–1.30)
EGFR (African American): >60
EGFR (Non-African Amer.): >60
Osmolality: 274 (ref 275–301)
SGPT (ALT): 20 U/L (ref 12–78)

## 2013-07-30 LAB — DRUG SCREEN, URINE
Amphetamines, Ur Screen: NEGATIVE (ref ?–1000)
Barbiturates, Ur Screen: NEGATIVE (ref ?–200)
Benzodiazepine, Ur Scrn: POSITIVE (ref ?–200)
MDMA (Ecstasy)Ur Screen: NEGATIVE (ref ?–500)
Opiate, Ur Screen: NEGATIVE (ref ?–300)

## 2013-07-30 LAB — ETHANOL
Ethanol %: <0.003 % (ref 0.000–0.080)
Ethanol: <3 mg/dL

## 2013-07-30 LAB — SALICYLATE LEVEL: Salicylates, Serum: 1.8 mg/dL

## 2013-07-30 LAB — ACETAMINOPHEN LEVEL: Acetaminophen: <2 ug/mL

## 2013-07-31 ENCOUNTER — Observation Stay: Payer: Self-pay | Admitting: Internal Medicine

## 2013-07-31 LAB — MAGNESIUM: Magnesium: 1.7 mg/dL — ABNORMAL LOW

## 2013-07-31 LAB — CBC WITH DIFFERENTIAL/PLATELET
Basophil #: 0.1 10*3/uL (ref 0.0–0.1)
Eosinophil %: 2.6 %
HCT: 34.4 % — ABNORMAL LOW (ref 35.0–47.0)
HGB: 11.4 g/dL — ABNORMAL LOW (ref 12.0–16.0)
Lymphocyte %: 19.6 %
MCH: 23.8 pg — ABNORMAL LOW (ref 26.0–34.0)
MCHC: 33.1 g/dL (ref 32.0–36.0)
MCV: 72 fL — ABNORMAL LOW (ref 80–100)
Neutrophil #: 7.7 10*3/uL — ABNORMAL HIGH (ref 1.4–6.5)
Neutrophil %: 70.6 %
Platelet: 371 10*3/uL (ref 150–440)
RBC: 4.78 10*6/uL (ref 3.80–5.20)
WBC: 10.9 10*3/uL (ref 3.6–11.0)

## 2013-07-31 LAB — DRUG SCREEN, URINE
Amphetamines, Ur Screen: NEGATIVE (ref ?–1000)
Barbiturates, Ur Screen: NEGATIVE (ref ?–200)
Cocaine Metabolite,Ur ~~LOC~~: NEGATIVE (ref ?–300)
MDMA (Ecstasy)Ur Screen: NEGATIVE (ref ?–500)
Methadone, Ur Screen: NEGATIVE (ref ?–300)
Opiate, Ur Screen: NEGATIVE (ref ?–300)
Phencyclidine (PCP) Ur S: NEGATIVE (ref ?–25)

## 2013-07-31 LAB — PREGNANCY, URINE: Pregnancy Test, Urine: NEGATIVE m[IU]/mL

## 2013-07-31 LAB — BASIC METABOLIC PANEL
Anion Gap: 7 (ref 7–16)
BUN: 6 mg/dL — ABNORMAL LOW (ref 7–18)
Calcium, Total: 8.8 mg/dL (ref 8.5–10.1)
Chloride: 107 mmol/L (ref 98–107)
Co2: 24 mmol/L (ref 21–32)
Creatinine: 0.7 mg/dL (ref 0.60–1.30)
Osmolality: 274 (ref 275–301)

## 2013-07-31 LAB — PHENYTOIN LEVEL, TOTAL: Dilantin: 0.7 ug/mL — ABNORMAL LOW (ref 10.0–20.0)

## 2013-07-31 LAB — URINE CULTURE

## 2013-08-01 ENCOUNTER — Inpatient Hospital Stay: Payer: Self-pay | Admitting: Internal Medicine

## 2013-08-01 LAB — URINALYSIS, COMPLETE
Bacteria: NONE SEEN
Bilirubin,UR: NEGATIVE
Glucose,UR: NEGATIVE mg/dL (ref 0–75)
Leukocyte Esterase: NEGATIVE
Ph: 5 (ref 4.5–8.0)
Protein: 30
RBC,UR: 111 /HPF (ref 0–5)
Specific Gravity: 1.03 (ref 1.003–1.030)

## 2013-08-01 LAB — COMPREHENSIVE METABOLIC PANEL
Albumin: 3.6 g/dL (ref 3.4–5.0)
Alkaline Phosphatase: 87 U/L (ref 50–136)
BUN: 10 mg/dL (ref 7–18)
Bilirubin,Total: 0.1 mg/dL — ABNORMAL LOW (ref 0.2–1.0)
Calcium, Total: 8.8 mg/dL (ref 8.5–10.1)
Co2: 26 mmol/L (ref 21–32)
Creatinine: 0.77 mg/dL (ref 0.60–1.30)
EGFR (African American): >60
Glucose: 104 mg/dL — ABNORMAL HIGH (ref 65–99)
Potassium: 3.6 mmol/L (ref 3.5–5.1)
SGPT (ALT): 21 U/L (ref 12–78)
Total Protein: 7.5 g/dL (ref 6.4–8.2)

## 2013-08-01 LAB — CBC
HCT: 31.1 % — ABNORMAL LOW (ref 35.0–47.0)
MCH: 22.7 pg — ABNORMAL LOW (ref 26.0–34.0)
RBC: 4.38 10*6/uL (ref 3.80–5.20)

## 2013-08-01 LAB — CARBAMAZEPINE LEVEL, TOTAL: Carbamazepine: 5.1 ug/mL (ref 4.0–12.0)

## 2013-08-01 LAB — DRUG SCREEN, URINE
Amphetamines, Ur Screen: NEGATIVE (ref ?–1000)
Barbiturates, Ur Screen: NEGATIVE (ref ?–200)
MDMA (Ecstasy)Ur Screen: NEGATIVE (ref ?–500)
Methadone, Ur Screen: NEGATIVE (ref ?–300)
Phencyclidine (PCP) Ur S: NEGATIVE (ref ?–25)
Tricyclic, Ur Screen: NEGATIVE (ref ?–1000)

## 2013-08-01 LAB — ETHANOL: Ethanol %: <0.003 % (ref 0.000–0.080)

## 2013-08-02 LAB — BASIC METABOLIC PANEL
Anion Gap: 4 — ABNORMAL LOW (ref 7–16)
BUN: 8 mg/dL (ref 7–18)
Calcium, Total: 8.5 mg/dL (ref 8.5–10.1)
EGFR (African American): >60
Osmolality: 275 (ref 275–301)
Sodium: 139 mmol/L (ref 136–145)

## 2013-08-02 LAB — CBC WITH DIFFERENTIAL/PLATELET
Basophil #: 0.1 10*3/uL (ref 0.0–0.1)
Eosinophil #: 0.2 10*3/uL (ref 0.0–0.7)
HCT: 35.8 % (ref 35.0–47.0)
HGB: 11.6 g/dL — ABNORMAL LOW (ref 12.0–16.0)
MCH: 23.2 pg — ABNORMAL LOW (ref 26.0–34.0)
MCHC: 32.5 g/dL (ref 32.0–36.0)
MCV: 71 fL — ABNORMAL LOW (ref 80–100)
Monocyte #: 0.7 x10 3/mm (ref 0.2–0.9)
Monocyte %: 6.7 %
Neutrophil %: 62 %
RBC: 5.02 10*6/uL (ref 3.80–5.20)
RDW: 17.3 % — ABNORMAL HIGH (ref 11.5–14.5)
WBC: 10.9 10*3/uL (ref 3.6–11.0)

## 2013-08-04 ENCOUNTER — Emergency Department: Payer: Self-pay | Admitting: Emergency Medicine

## 2013-08-06 ENCOUNTER — Emergency Department: Payer: Self-pay | Admitting: Emergency Medicine

## 2013-08-06 LAB — COMPREHENSIVE METABOLIC PANEL
Alkaline Phosphatase: 88 U/L (ref 50–136)
BUN: 12 mg/dL (ref 7–18)
Bilirubin,Total: 0.1 mg/dL — ABNORMAL LOW (ref 0.2–1.0)
Calcium, Total: 8.5 mg/dL (ref 8.5–10.1)
Co2: 26 mmol/L (ref 21–32)
EGFR (Non-African Amer.): >60
Osmolality: 280 (ref 275–301)
Potassium: 3.5 mmol/L (ref 3.5–5.1)

## 2013-08-06 LAB — CBC
HGB: 11.1 g/dL — ABNORMAL LOW (ref 12.0–16.0)
MCHC: 32.5 g/dL (ref 32.0–36.0)
RBC: 4.8 10*6/uL (ref 3.80–5.20)
RDW: 17.3 % — ABNORMAL HIGH (ref 11.5–14.5)
WBC: 10.1 10*3/uL (ref 3.6–11.0)

## 2013-08-06 LAB — URINALYSIS, COMPLETE
Bilirubin,UR: NEGATIVE
Blood: NEGATIVE
Ketone: NEGATIVE
Leukocyte Esterase: NEGATIVE
Nitrite: NEGATIVE
Ph: 6 (ref 4.5–8.0)
Protein: NEGATIVE
Specific Gravity: 1.028 (ref 1.003–1.030)
Squamous Epithelial: 1
WBC UR: 3 /HPF (ref 0–5)

## 2013-08-09 ENCOUNTER — Emergency Department (HOSPITAL_COMMUNITY)
Admission: EM | Admit: 2013-08-09 | Discharge: 2013-08-09 | Disposition: A | Discharge status: Home / Self Care | Payer: Medicaid Other | Attending: Emergency Medicine | Admitting: Emergency Medicine

## 2013-08-09 ENCOUNTER — Encounter (HOSPITAL_COMMUNITY): Payer: Self-pay | Admitting: Emergency Medicine

## 2013-08-09 DIAGNOSIS — Z79899 Other long term (current) drug therapy: Secondary | ICD-10-CM | POA: Insufficient documentation

## 2013-08-09 DIAGNOSIS — G40909 Epilepsy, unspecified, not intractable, without status epilepticus: Secondary | ICD-10-CM | POA: Insufficient documentation

## 2013-08-09 DIAGNOSIS — Z91040 Latex allergy status: Secondary | ICD-10-CM | POA: Insufficient documentation

## 2013-08-09 DIAGNOSIS — E119 Type 2 diabetes mellitus without complications: Secondary | ICD-10-CM | POA: Insufficient documentation

## 2013-08-09 DIAGNOSIS — F17200 Nicotine dependence, unspecified, uncomplicated: Secondary | ICD-10-CM | POA: Insufficient documentation

## 2013-08-09 DIAGNOSIS — J45909 Unspecified asthma, uncomplicated: Secondary | ICD-10-CM | POA: Insufficient documentation

## 2013-08-09 DIAGNOSIS — R011 Cardiac murmur, unspecified: Secondary | ICD-10-CM | POA: Insufficient documentation

## 2013-08-09 DIAGNOSIS — R569 Unspecified convulsions: Secondary | ICD-10-CM

## 2013-08-09 LAB — BASIC METABOLIC PANEL
CO2: 25 mEq/L (ref 19–32)
Chloride: 102 mEq/L (ref 96–112)
GFR calc non Af Amer: >90 mL/min (ref >90)
Glucose, Bld: 105 mg/dL — ABNORMAL HIGH (ref 70–99)
Sodium: 139 mEq/L (ref 135–145)

## 2013-08-09 MED ORDER — SODIUM CHLORIDE 0.9 % IV SOLN
500.0000 mg | Freq: Once | INTRAVENOUS | Status: AC
  Administered 2013-08-09: 500 mg via INTRAVENOUS
  Filled 2013-08-09: qty 5

## 2013-08-09 MED ORDER — LEVETIRACETAM 500 MG/5ML IV SOLN
INTRAVENOUS | Status: AC
  Filled 2013-08-09: qty 5

## 2013-08-09 NOTE — ED Notes (Signed)
Per EMS pts family states she has had 2-3 seizures today. It is unknown if pt is compliant with meds per ems. Pt is very drowsy.
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
## 2013-08-14 ENCOUNTER — Emergency Department: Payer: Self-pay | Admitting: Emergency Medicine

## 2013-08-14 LAB — URINALYSIS, COMPLETE
Blood: NEGATIVE
Ketone: NEGATIVE
RBC,UR: 1 /HPF (ref 0–5)
Squamous Epithelial: 18

## 2013-08-14 LAB — COMPREHENSIVE METABOLIC PANEL
Alkaline Phosphatase: 81 U/L (ref 50–136)
Anion Gap: 6 — ABNORMAL LOW (ref 7–16)
Bilirubin,Total: 0.1 mg/dL — ABNORMAL LOW (ref 0.2–1.0)
Calcium, Total: 8.7 mg/dL (ref 8.5–10.1)
Chloride: 109 mmol/L — ABNORMAL HIGH (ref 98–107)
EGFR (African American): >60
Glucose: 134 mg/dL — ABNORMAL HIGH (ref 65–99)
Osmolality: 282 (ref 275–301)
Potassium: 3.4 mmol/L — ABNORMAL LOW (ref 3.5–5.1)
SGOT(AST): 20 U/L (ref 15–37)
SGPT (ALT): 21 U/L (ref 12–78)

## 2013-08-14 LAB — CBC
HGB: 11.5 g/dL — ABNORMAL LOW (ref 12.0–16.0)
MCH: 23.3 pg — ABNORMAL LOW (ref 26.0–34.0)
MCHC: 32.8 g/dL (ref 32.0–36.0)
MCV: 71 fL — ABNORMAL LOW (ref 80–100)
Platelet: 335 10*3/uL (ref 150–440)
RBC: 4.94 10*6/uL (ref 3.80–5.20)

## 2013-08-14 LAB — HCG, QUANTITATIVE, PREGNANCY: Beta Hcg, Quant.: <1 m[IU]/mL — ABNORMAL LOW

## 2013-09-01 ENCOUNTER — Emergency Department: Payer: Self-pay | Admitting: Emergency Medicine

## 2013-09-01 LAB — CBC WITH DIFFERENTIAL/PLATELET
Eosinophil %: 4 %
HCT: 38 % (ref 35.0–47.0)
HGB: 12.3 g/dL (ref 12.0–16.0)
Lymphocyte #: 3.3 10*3/uL (ref 1.0–3.6)
Lymphocyte %: 20.3 %
MCH: 23 pg — ABNORMAL LOW (ref 26.0–34.0)
MCV: 71 fL — ABNORMAL LOW (ref 80–100)
Monocyte %: 6.7 %
Neutrophil #: 11 10*3/uL — ABNORMAL HIGH (ref 1.4–6.5)
Neutrophil %: 67.6 %
WBC: 16.2 10*3/uL — ABNORMAL HIGH (ref 3.6–11.0)

## 2013-09-01 LAB — COMPREHENSIVE METABOLIC PANEL
Albumin: 3.4 g/dL (ref 3.4–5.0)
Anion Gap: 7 (ref 7–16)
BUN: 10 mg/dL (ref 7–18)
Bilirubin,Total: 0.2 mg/dL (ref 0.2–1.0)
Calcium, Total: 9 mg/dL (ref 8.5–10.1)
Chloride: 105 mmol/L (ref 98–107)
Co2: 27 mmol/L (ref 21–32)
Creatinine: 0.68 mg/dL (ref 0.60–1.30)
Glucose: 94 mg/dL (ref 65–99)
Osmolality: 276 (ref 275–301)
Potassium: 3.3 mmol/L — ABNORMAL LOW (ref 3.5–5.1)
SGPT (ALT): 24 U/L (ref 12–78)
Sodium: 139 mmol/L (ref 136–145)
Total Protein: 7.1 g/dL (ref 6.4–8.2)

## 2013-09-20 ENCOUNTER — Encounter (HOSPITAL_COMMUNITY): Payer: Self-pay | Admitting: Emergency Medicine

## 2013-09-20 ENCOUNTER — Emergency Department (HOSPITAL_COMMUNITY)
Admission: EM | Admit: 2013-09-20 | Discharge: 2013-09-20 | Disposition: A | Discharge status: Home / Self Care | Payer: Medicaid Other | Attending: Emergency Medicine | Admitting: Emergency Medicine

## 2013-09-20 DIAGNOSIS — N83209 Unspecified ovarian cyst, unspecified side: Secondary | ICD-10-CM

## 2013-09-20 DIAGNOSIS — J45909 Unspecified asthma, uncomplicated: Secondary | ICD-10-CM | POA: Insufficient documentation

## 2013-09-20 DIAGNOSIS — F17200 Nicotine dependence, unspecified, uncomplicated: Secondary | ICD-10-CM | POA: Insufficient documentation

## 2013-09-20 DIAGNOSIS — Z3202 Encounter for pregnancy test, result negative: Secondary | ICD-10-CM | POA: Insufficient documentation

## 2013-09-20 DIAGNOSIS — B9689 Other specified bacterial agents as the cause of diseases classified elsewhere: Secondary | ICD-10-CM | POA: Insufficient documentation

## 2013-09-20 DIAGNOSIS — N760 Acute vaginitis: Secondary | ICD-10-CM | POA: Insufficient documentation

## 2013-09-20 DIAGNOSIS — Z91040 Latex allergy status: Secondary | ICD-10-CM | POA: Insufficient documentation

## 2013-09-20 DIAGNOSIS — G40909 Epilepsy, unspecified, not intractable, without status epilepticus: Secondary | ICD-10-CM | POA: Insufficient documentation

## 2013-09-20 DIAGNOSIS — R011 Cardiac murmur, unspecified: Secondary | ICD-10-CM | POA: Insufficient documentation

## 2013-09-20 DIAGNOSIS — Z79899 Other long term (current) drug therapy: Secondary | ICD-10-CM | POA: Insufficient documentation

## 2013-09-20 DIAGNOSIS — E119 Type 2 diabetes mellitus without complications: Secondary | ICD-10-CM | POA: Insufficient documentation

## 2013-09-20 DIAGNOSIS — Z9089 Acquired absence of other organs: Secondary | ICD-10-CM | POA: Insufficient documentation

## 2013-09-20 DIAGNOSIS — A499 Bacterial infection, unspecified: Secondary | ICD-10-CM | POA: Insufficient documentation

## 2013-09-20 HISTORY — DX: Unspecified ovarian cyst, unspecified side: N83.209

## 2013-09-20 LAB — URINALYSIS, ROUTINE W REFLEX MICROSCOPIC
Glucose, UA: NEGATIVE mg/dL
Leukocytes, UA: NEGATIVE
Nitrite: NEGATIVE
Protein, ur: NEGATIVE mg/dL
Specific Gravity, Urine: 1.025 (ref 1.005–1.030)
pH: 7 (ref 5.0–8.0)

## 2013-09-20 LAB — PREGNANCY, URINE: Preg Test, Ur: NEGATIVE

## 2013-09-20 LAB — WET PREP, GENITAL
Trich, Wet Prep: NONE SEEN
Yeast Wet Prep HPF POC: NONE SEEN

## 2013-09-20 MED ORDER — OXYCODONE-ACETAMINOPHEN 5-325 MG PO TABS
1.0000 | ORAL_TABLET | Freq: Once | ORAL | Status: AC
  Administered 2013-09-20: 1 via ORAL
  Filled 2013-09-20: qty 1

## 2013-09-20 MED ORDER — OXYCODONE-ACETAMINOPHEN 5-325 MG PO TABS: 1.0000 | ORAL_TABLET | ORAL | Status: DC | PRN

## 2013-09-20 MED ORDER — METRONIDAZOLE 500 MG PO TABS: 500.0000 mg | ORAL_TABLET | Freq: Two times a day (BID) | ORAL | Status: DC

## 2013-09-20 NOTE — ED Notes (Signed)
I have a cyst on my right ovary that started bothering me yesterday per pt. I just had surgery done on my left ovary in September per pt.

## 2013-09-20 NOTE — ED Provider Notes (Signed)
TIME SEEN: 9:17 PM  CHIEF COMPLAINT: Right-sided abdominal pain  HPI: Patient is a 22 year old G4 P0 A4 with one prior ectopic pregnancy, 2 miscarriages and one elective abortion, history of seizures and diabetes who presents the emergency department with right-sided abdominal pain has been present for the past 3 months. She states it is a sharp pain that does not radiate. It is worse with sexual intercourse. She takes Percocet at home which is prescribed by her OB/GYN which helps relieve her pain. She states that her pain has been slightly worse today and her boyfriend brought her to the emergency department because she had pain with sexual intercourse last night. She states her last period was approximately 2 months ago which is abnormal for her. She states she is sexually active with one partner and uses condoms. She denies a prior history is STDs. She denies any fevers, chills, nausea, vomiting or diarrhea. No vaginal discharge or bleeding.  She states she noted she has a right-sided ovarian cyst that she has had transvaginal ultrasound at [facility] emergency department and at her OB/GYN's office. She has had a prior left-sided ovarian cyst that required cystectomy. She states her OB/GYN has recommended cystectomy for this ovarian cysts as well.   ROS: See HPI Constitutional: no fever  Eyes: no drainage  ENT: no runny nose   Cardiovascular:  no chest pain  Resp: no SOB  GI: no vomiting GU: no dysuria Integumentary: no rash  Allergy: no hives  Musculoskeletal: no leg swelling  Neurological: no slurred speech ROS otherwise negative  PAST MEDICAL HISTORY/PAST SURGICAL HISTORY:  Past Medical History  Diagnosis Date  . Asthma   . Seizures   . Diabetes mellitus without complication   . Tubal pregnancy   . Heart murmur   . Ovarian cyst     MEDICATIONS:  Prior to Admission medications   Medication Sig Start Date End Date Taking? Authorizing Provider  albuterol (PROVENTIL  HFA;VENTOLIN HFA) 108 (90 BASE) MCG/ACT inhaler Inhale 2 puffs into the lungs every 6 (six) hours as needed. For shortness of breath    Historical Provider, MD  carbamazepine (TEGRETOL) 200 MG tablet Take 200 mg by mouth 3 (three) times daily.    Historical Provider, MD  cephALEXin (KEFLEX) 500 MG capsule Take 1 capsule (500 mg total) by mouth 4 (four) times daily. 07/14/13   Celene Kras, MD  clonazePAM (KLONOPIN) 1 MG tablet Take 1 tablet (1 mg total) by mouth 2 (two) times daily. For anxiety and panic. 06/19/13   Verne Spurr, PA-C  FLUoxetine (PROZAC) 20 MG capsule Take 1 capsule (20 mg total) by mouth daily. For anxiety and depression. 06/19/13   Verne Spurr, PA-C  ibuprofen (ADVIL,MOTRIN) 200 MG tablet Take 400 mg by mouth every 6 (six) hours as needed for pain.    Historical Provider, MD  levETIRAcetam (KEPPRA) 1000 MG tablet Take 1 tablet (1,000 mg total) by mouth 2 (two) times daily. For seizures. 06/19/13   Verne Spurr, PA-C  metFORMIN (GLUCOPHAGE) 500 MG tablet Take 1 tablet (500 mg total) by mouth 2 (two) times daily with a meal. For type 2 diabetes. 06/19/13   Verne Spurr, PA-C  Vitamin D, Ergocalciferol, (DRISDOL) 50000 UNITS CAPS capsule Take 1 capsule (50,000 Units total) by mouth every 7 (seven) days. Takes Wednesday for low vitamin D. 06/19/13   Verne Spurr, PA-C    ALLERGIES:  Allergies  Allergen Reactions  . Morphine And Related Other (See Comments)    bradycardia  .  Tomato Anaphylaxis  . Toradol [Ketorolac Tromethamine] Anaphylaxis and Hives  . Iohexol     Pt. States she can not take iohexol but won't state reaction type.  . Lamictal [Lamotrigine]   . Sulfa Antibiotics Hives  . Latex Rash  . Mushroom Extract Complex Rash  . Tramadol Swelling and Rash    SOCIAL HISTORY:  History  Substance Use Topics  . Smoking status: Current Every Day Smoker -- 0.50 packs/day for 3 years    Types: Cigarettes  . Smokeless tobacco: Not on file  . Alcohol Use: No     Comment:  occ    FAMILY HISTORY: History reviewed. No pertinent family history.  EXAM: BP 112/59  Pulse 71  Temp(Src) 98.5 F (36.9 C) (Oral)  Resp 24  Ht 5' (1.524 m)  Wt 180 lb (81.647 kg)  BMI 35.15 kg/m2  SpO2 99%  LMP 07/07/2013 CONSTITUTIONAL: Alert and oriented and responds appropriately to questions. Well-appearing; well-nourished, appears comfortable HEAD: Normocephalic EYES: Conjunctivae clear, PERRL ENT: normal nose; no rhinorrhea; moist mucous membranes; pharynx without lesions noted NECK: Supple, no meningismus, no LAD  CARD: RRR; S1 and S2 appreciated; no murmurs, no clicks, no rubs, no gallops RESP: Normal chest excursion without splinting or tachypnea; breath sounds clear and equal bilaterally; no wheezes, no rhonchi, no rales,  ABD/GI: Normal bowel sounds; non-distended; soft, minimal amount of right pelvic pain with palpation of her abdomen with no rebound or guarding, no peritoneal signs GU:  Normal external genitalia, no cervical motion tenderness, minimal amount of right adnexal tenderness without fullness, no left adnexal tenderness or fullness, no vaginal bleeding, minimal amount of thin, white vaginal discharge BACK:  The back appears normal and is non-tender to palpation, there is no CVA tenderness EXT: Normal ROM in all joints; non-tender to palpation; no edema; normal capillary refill; no cyanosis    SKIN: Normal color for age and race; warm NEURO: Moves all extremities equally PSYCH: The patient's mood and manner are appropriate. Grooming and personal hygiene are appropriate.  MEDICAL DECISION MAKING: Patient here with right adnexal pain which is likely due to her known history of ovarian cysts. I am not concerned for torsion given patient is very well-appearing and has a very benign exam. Will obtain urinalysis, urine pregnancy and wet prep, GC and Chlamydia. We'll give Percocet in the ED the patient reports she does have pain medication at home.   ED PROGRESS:  Urinalysis shows no sign of infection. Urine pregnancy test is negative. Wet prep shows clue cells. Will treat for bacterial vaginosis with Flagyl. We'll have her followup with her primary care physician. Given return precautions. Patient verbalizes understanding and is comfortable plan     Faith Maw Luiza Carranco, DO 09/20/13 2207

## 2013-09-24 ENCOUNTER — Emergency Department: Payer: Self-pay | Admitting: Emergency Medicine

## 2013-10-08 ENCOUNTER — Emergency Department: Payer: Self-pay | Admitting: Emergency Medicine

## 2013-10-11 ENCOUNTER — Emergency Department: Payer: Self-pay | Admitting: Emergency Medicine

## 2013-10-11 LAB — CBC
HCT: 37 % (ref 35.0–47.0)
HGB: 11.8 g/dL — ABNORMAL LOW (ref 12.0–16.0)
MCH: 22.7 pg — ABNORMAL LOW (ref 26.0–34.0)
MCHC: 31.8 g/dL — AB (ref 32.0–36.0)
MCV: 72 fL — ABNORMAL LOW (ref 80–100)
Platelet: 351 10*3/uL (ref 150–440)
RBC: 5.17 10*6/uL (ref 3.80–5.20)
RDW: 18.4 % — ABNORMAL HIGH (ref 11.5–14.5)
WBC: 9.8 10*3/uL (ref 3.6–11.0)

## 2013-10-11 LAB — COMPREHENSIVE METABOLIC PANEL
Albumin: 3.5 g/dL (ref 3.4–5.0)
Alkaline Phosphatase: 67 U/L
Anion Gap: 2 — ABNORMAL LOW (ref 7–16)
BUN: 11 mg/dL (ref 7–18)
Bilirubin,Total: 0.3 mg/dL (ref 0.2–1.0)
CHLORIDE: 105 mmol/L (ref 98–107)
CO2: 27 mmol/L (ref 21–32)
Calcium, Total: 8.8 mg/dL (ref 8.5–10.1)
Creatinine: 0.63 mg/dL (ref 0.60–1.30)
EGFR (African American): >60
Glucose: 103 mg/dL — ABNORMAL HIGH (ref 65–99)
OSMOLALITY: 268 (ref 275–301)
Potassium: 3.4 mmol/L — ABNORMAL LOW (ref 3.5–5.1)
SGOT(AST): 11 U/L — ABNORMAL LOW (ref 15–37)
SGPT (ALT): 21 U/L (ref 12–78)
Sodium: 134 mmol/L — ABNORMAL LOW (ref 136–145)
Total Protein: 6.9 g/dL (ref 6.4–8.2)

## 2013-10-11 LAB — DRUG SCREEN, URINE

## 2013-10-11 LAB — URINALYSIS, COMPLETE
BACTERIA: NONE SEEN
Bilirubin,UR: NEGATIVE
GLUCOSE, UR: NEGATIVE mg/dL (ref 0–75)
KETONE: NEGATIVE
Leukocyte Esterase: NEGATIVE
Nitrite: NEGATIVE
Ph: 7 (ref 4.5–8.0)
Protein: 30
Specific Gravity: 1.025 (ref 1.003–1.030)
Squamous Epithelial: 2
WBC UR: 2 /HPF (ref 0–5)

## 2013-10-11 LAB — ETHANOL
Ethanol %: <0.003 % (ref 0.000–0.080)
Ethanol: <3 mg/dL

## 2013-10-11 LAB — CARBAMAZEPINE LEVEL, TOTAL: Carbamazepine: <0.5 ug/mL — ABNORMAL LOW (ref 4.0–12.0)

## 2013-10-17 ENCOUNTER — Emergency Department: Payer: Self-pay | Admitting: Emergency Medicine

## 2013-10-28 ENCOUNTER — Emergency Department: Payer: Self-pay | Admitting: Emergency Medicine

## 2013-10-28 LAB — CBC
HCT: 40.3 % (ref 35.0–47.0)
HGB: 12.7 g/dL (ref 12.0–16.0)
MCH: 22.7 pg — ABNORMAL LOW (ref 26.0–34.0)
MCHC: 31.5 g/dL — ABNORMAL LOW (ref 32.0–36.0)
MCV: 72 fL — ABNORMAL LOW (ref 80–100)
Platelet: 416 10*3/uL (ref 150–440)
RBC: 5.59 10*6/uL — ABNORMAL HIGH (ref 3.80–5.20)
RDW: 18.1 % — ABNORMAL HIGH (ref 11.5–14.5)
WBC: 15 10*3/uL — AB (ref 3.6–11.0)

## 2013-10-28 LAB — BASIC METABOLIC PANEL
Anion Gap: 8 (ref 7–16)
BUN: 8 mg/dL (ref 7–18)
CALCIUM: 9.1 mg/dL (ref 8.5–10.1)
CHLORIDE: 105 mmol/L (ref 98–107)
Co2: 24 mmol/L (ref 21–32)
Creatinine: 0.7 mg/dL (ref 0.60–1.30)
GLUCOSE: 93 mg/dL (ref 65–99)
Osmolality: 272 (ref 275–301)
Potassium: 3.8 mmol/L (ref 3.5–5.1)
Sodium: 137 mmol/L (ref 136–145)

## 2013-11-04 ENCOUNTER — Emergency Department: Payer: Self-pay | Admitting: Emergency Medicine

## 2013-11-04 LAB — COMPREHENSIVE METABOLIC PANEL
ALK PHOS: 76 U/L
ALT: 20 U/L (ref 12–78)
Albumin: 3.2 g/dL — ABNORMAL LOW (ref 3.4–5.0)
Anion Gap: 4 — ABNORMAL LOW (ref 7–16)
BILIRUBIN TOTAL: 0.5 mg/dL (ref 0.2–1.0)
BUN: 6 mg/dL — ABNORMAL LOW (ref 7–18)
CHLORIDE: 106 mmol/L (ref 98–107)
CREATININE: 0.36 mg/dL — AB (ref 0.60–1.30)
Calcium, Total: 8.7 mg/dL (ref 8.5–10.1)
Co2: 25 mmol/L (ref 21–32)
EGFR (African American): >60
Glucose: 87 mg/dL (ref 65–99)
Osmolality: 267 (ref 275–301)
Potassium: 4.8 mmol/L (ref 3.5–5.1)
SGOT(AST): 24 U/L (ref 15–37)
Sodium: 135 mmol/L — ABNORMAL LOW (ref 136–145)
Total Protein: 7.1 g/dL (ref 6.4–8.2)

## 2013-11-04 LAB — URINALYSIS, COMPLETE
Bacteria: NONE SEEN
Bilirubin,UR: NEGATIVE
Blood: NEGATIVE
Glucose,UR: NEGATIVE mg/dL (ref 0–75)
KETONE: NEGATIVE
Leukocyte Esterase: NEGATIVE
Nitrite: NEGATIVE
PH: 7 (ref 4.5–8.0)
Protein: NEGATIVE
RBC,UR: 2 /HPF (ref 0–5)
Specific Gravity: 1.013 (ref 1.003–1.030)
WBC UR: NONE SEEN /HPF (ref 0–5)

## 2013-11-04 LAB — CBC
HCT: 37.6 % (ref 35.0–47.0)
HGB: 11.9 g/dL — ABNORMAL LOW (ref 12.0–16.0)
MCH: 23 pg — AB (ref 26.0–34.0)
MCHC: 31.6 g/dL — ABNORMAL LOW (ref 32.0–36.0)
MCV: 73 fL — AB (ref 80–100)
Platelet: 324 10*3/uL (ref 150–440)
RBC: 5.16 10*6/uL (ref 3.80–5.20)
RDW: 17.9 % — ABNORMAL HIGH (ref 11.5–14.5)
WBC: 11.4 10*3/uL — ABNORMAL HIGH (ref 3.6–11.0)

## 2013-11-04 LAB — DRUG SCREEN, URINE

## 2013-11-04 LAB — PREGNANCY, URINE: PREGNANCY TEST, URINE: NEGATIVE m[IU]/mL

## 2013-11-04 LAB — CARBAMAZEPINE LEVEL, TOTAL: Carbamazepine: <0.5 ug/mL — ABNORMAL LOW (ref 4.0–12.0)

## 2013-12-22 ENCOUNTER — Encounter (HOSPITAL_COMMUNITY): Payer: Self-pay | Admitting: Emergency Medicine

## 2013-12-22 ENCOUNTER — Emergency Department (HOSPITAL_COMMUNITY)
Admission: EM | Admit: 2013-12-22 | Discharge: 2013-12-23 | Disposition: A | Discharge status: Home / Self Care | Payer: Medicaid Other | Attending: Emergency Medicine | Admitting: Emergency Medicine

## 2013-12-22 DIAGNOSIS — E119 Type 2 diabetes mellitus without complications: Secondary | ICD-10-CM | POA: Insufficient documentation

## 2013-12-22 DIAGNOSIS — Z79899 Other long term (current) drug therapy: Secondary | ICD-10-CM | POA: Insufficient documentation

## 2013-12-22 DIAGNOSIS — Z3202 Encounter for pregnancy test, result negative: Secondary | ICD-10-CM | POA: Insufficient documentation

## 2013-12-22 DIAGNOSIS — G40909 Epilepsy, unspecified, not intractable, without status epilepticus: Secondary | ICD-10-CM | POA: Insufficient documentation

## 2013-12-22 DIAGNOSIS — I10 Essential (primary) hypertension: Secondary | ICD-10-CM | POA: Insufficient documentation

## 2013-12-22 DIAGNOSIS — R011 Cardiac murmur, unspecified: Secondary | ICD-10-CM | POA: Insufficient documentation

## 2013-12-22 DIAGNOSIS — R1011 Right upper quadrant pain: Secondary | ICD-10-CM | POA: Insufficient documentation

## 2013-12-22 DIAGNOSIS — F17200 Nicotine dependence, unspecified, uncomplicated: Secondary | ICD-10-CM | POA: Insufficient documentation

## 2013-12-22 DIAGNOSIS — Z8742 Personal history of other diseases of the female genital tract: Secondary | ICD-10-CM | POA: Insufficient documentation

## 2013-12-22 DIAGNOSIS — Z8719 Personal history of other diseases of the digestive system: Secondary | ICD-10-CM | POA: Insufficient documentation

## 2013-12-22 DIAGNOSIS — Z91040 Latex allergy status: Secondary | ICD-10-CM | POA: Insufficient documentation

## 2013-12-22 DIAGNOSIS — J45909 Unspecified asthma, uncomplicated: Secondary | ICD-10-CM | POA: Insufficient documentation

## 2013-12-22 DIAGNOSIS — R109 Unspecified abdominal pain: Secondary | ICD-10-CM

## 2013-12-22 DIAGNOSIS — R112 Nausea with vomiting, unspecified: Secondary | ICD-10-CM | POA: Insufficient documentation

## 2013-12-22 HISTORY — DX: Calculus of gallbladder without cholecystitis without obstruction: K80.20

## 2013-12-22 LAB — CBC WITH DIFFERENTIAL/PLATELET
BASOS ABS: 0.1 10*3/uL (ref 0.0–0.1)
BASOS PCT: 1 % (ref 0–1)
EOS ABS: 0.2 10*3/uL (ref 0.0–0.7)
EOS PCT: 2 % (ref 0–5)
HCT: 38.1 % (ref 36.0–46.0)
Hemoglobin: 11.9 g/dL — ABNORMAL LOW (ref 12.0–15.0)
Lymphocytes Relative: 26 % (ref 12–46)
Lymphs Abs: 2.6 10*3/uL (ref 0.7–4.0)
MCH: 23.4 pg — ABNORMAL LOW (ref 26.0–34.0)
MCHC: 31.2 g/dL (ref 30.0–36.0)
MCV: 75 fL — AB (ref 78.0–100.0)
MONO ABS: 0.8 10*3/uL (ref 0.1–1.0)
Monocytes Relative: 8 % (ref 3–12)
Neutro Abs: 6.4 10*3/uL (ref 1.7–7.7)
Neutrophils Relative %: 64 % (ref 43–77)
Platelets: 340 10*3/uL (ref 150–400)
RBC: 5.08 MIL/uL (ref 3.87–5.11)
RDW: 17.3 % — AB (ref 11.5–15.5)
WBC: 10.1 10*3/uL (ref 4.0–10.5)

## 2013-12-22 LAB — POC URINE PREG, ED: Preg Test, Ur: NEGATIVE

## 2013-12-22 MED ORDER — ONDANSETRON 8 MG PO TBDP
8.0000 mg | ORAL_TABLET | Freq: Once | ORAL | Status: AC
  Administered 2013-12-22: 8 mg via ORAL
  Filled 2013-12-22: qty 1

## 2013-12-22 MED ORDER — OXYCODONE-ACETAMINOPHEN 5-325 MG PO TABS
1.0000 | ORAL_TABLET | Freq: Once | ORAL | Status: AC
  Administered 2013-12-22: 1 via ORAL
  Filled 2013-12-22: qty 1

## 2013-12-22 NOTE — ED Provider Notes (Signed)
CSN: [id number]     Arrival date & time 12/22/13  2324 History  This chart was scribed for Faith Gaskins, MD by Ardelia Mems, ED Scribe. This patient was seen in room APA05/APA05 and the patient's care was started at 11:35 PM.   Chief Complaint  Patient presents with  . Abdominal Pain    Patient is a 23 y.o. female presenting with abdominal pain. The history is provided by the patient. No language interpreter was used.  Abdominal Pain Pain location:  RUQ Pain radiates to:  Does not radiate Pain severity:  Severe Onset quality:  Gradual Duration:  5 hours (present for "a while" but worsened 5 hours ago) Timing:  Constant Progression:  Worsening Chronicity:  Recurrent Context comment:  Has a cholecystectomy scheduled for April 2015 Relieved by: has been relieved by Percocet, but pt ran out 3 days ago. Worsened by:  Nothing tried Ineffective treatments:  None tried Associated symptoms: nausea and vomiting   Associated symptoms: no diarrhea, no dysuria, no fever, no vaginal bleeding and no vaginal discharge     HPI Comments: Faith [name] is a 23 y.o. female who presents to the Emergency Department complaining of constant, severe RUQ abdominal pain for "a while" that worsened over the past 5 hours. She reports associated episodes of emesis tonight. She states that she has had an [place] of her gallbladder within the last month, showing multiple gall stones, and she states that she has an appointment with an MD in [place], [place] to have a cholecystectomy performed in May 2015. She states that she has been taking Percocet for this pain, but that she ran out 3 days ago, and her surgeon would not call in additional pain medication for her. She denies any fall or injuries to cause onset of her pain. She denies fever, diarrhea, dysuria, vaginal bleeding or discharge or any other pain or symptoms.    Past Medical History  Diagnosis Date  . Asthma   . Seizures   . Diabetes mellitus without  complication   . Tubal pregnancy   . Heart murmur   . Ovarian cyst   . Gallstones    Past Surgical History  Procedure Laterality Date  . Dilation and curettage of uterus    . Ovarian cyst removed    . Tonsillectomy    . Tonsillectomy and adenoidectomy     History reviewed. No pertinent family history. History  Substance Use Topics  . Smoking status: Current Every Day Smoker -- 0.50 packs/day for 3 years    Types: Cigarettes  . Smokeless tobacco: Not on file  . Alcohol Use: No     Comment: occ   OB History   Grav Para Term Preterm Abortions TAB SAB Ect Mult Living                 Review of Systems  Constitutional: Negative for fever.  Gastrointestinal: Positive for nausea, vomiting and abdominal pain. Negative for diarrhea.  Genitourinary: Negative for dysuria, vaginal bleeding and vaginal discharge.  All other systems reviewed and are negative.    Allergies  Morphine and related; Tomato; Toradol; Iohexol; Lamictal; Sulfa antibiotics; Latex; Mushroom extract complex; and Tramadol  Home Medications   Current Outpatient Rx  Name  Route  Sig  Dispense  Refill  . albuterol (PROVENTIL HFA;VENTOLIN HFA) 108 (90 BASE) MCG/ACT inhaler   Inhalation   Inhale 2 puffs into the lungs every 6 (six) hours as needed. For shortness of breath         .  carbamazepine (TEGRETOL) 200 MG tablet   Oral   Take 200 mg by mouth 3 (three) times daily.         . clonazePAM (KLONOPIN) 1 MG tablet   Oral   Take 1 tablet (1 mg total) by mouth 2 (two) times daily. For anxiety and panic.   60 tablet   0   . FLUoxetine (PROZAC) 20 MG capsule   Oral   Take 1 capsule (20 mg total) by mouth daily. For anxiety and depression.   30 capsule   0   . ibuprofen (ADVIL,MOTRIN) 200 MG tablet   Oral   Take 400 mg by mouth every 6 (six) hours as needed for pain.         Marland Kitchen. levETIRAcetam (KEPPRA) 1000 MG tablet   Oral   Take 1 tablet (1,000 mg total) by mouth 2 (two) times daily. For  seizures.   60 tablet   0   . metFORMIN (GLUCOPHAGE) 500 MG tablet   Oral   Take 1 tablet (500 mg total) by mouth 2 (two) times daily with a meal. For type 2 diabetes.         . Vitamin D, Ergocalciferol, (DRISDOL) 50000 UNITS CAPS capsule   Oral   Take 1 capsule (50,000 Units total) by mouth every 7 (seven) days. Takes Wednesday for low vitamin D.   30 capsule       Triage Vitals: BP 119/62  Pulse 78  Temp(Src) 97.9 F (36.6 C) (Oral)  Resp 16  Ht 5' (1.524 m)  Wt 195 lb (88.451 kg)  BMI 38.08 kg/m2  SpO2 95%  LMP 12/18/2013  Physical Exam  Nursing note and vitals reviewed. CONSTITUTIONAL: Well developed/well nourished HEAD: Normocephalic/atraumatic EYES: EOMI/PERRL, no scleral icterus ENMT: Mucous membranes moist NECK: supple no meningeal signs SPINE:entire spine nontender CV: S1/S2 noted, no murmurs/rubs/gallops noted LUNGS: Lungs are clear to auscultation bilaterally, no apparent distress ABDOMEN: soft, nontender, no rebound or guarding GU:no cva tenderness NEURO: Pt is awake/alert, moves all extremitiesx4 EXTREMITIES: pulses normal, full ROM SKIN: warm, color normal PSYCH: no abnormalities of mood noted   ED Course  Procedures  DIAGNOSTIC STUDIES: Oxygen Saturation is 95% on RA, adequate by my interpretation.    COORDINATION OF CARE: 11:40 PM- Discussed plan to obtain diagnostic lab work. Will also order medications. Pt advised of plan for treatment and pt agrees.  1:20 AM Pt sleeping on repeat exam She has no focal tenderness on evaluation She reports she lives in [place] but [place] in [place].  She reports having multiple ultrasounds previously documenting cholelithiasis.  She is unsure when surgery is planned but thinks it is next month.  She reports this pain has become a chronic process. She reports her physician would not call in pain meds I advised outpatient f/u and management of her abdominal pain Currently pt is  without signs of acute abdominal process at this time and I don't feel emergent imaging is needed at this time.  Advised APAP for her pain needs  Medications  oxyCODONE-acetaminophen (PERCOCET/ROXICET) 5-325 MG per tablet 1 tablet (1 tablet Oral Given 12/22/13 2346)  ondansetron (ZOFRAN-ODT) disintegrating tablet 8 mg (8 mg Oral Given 12/22/13 2346)   Labs Review Labs Reviewed  URINALYSIS, ROUTINE W REFLEX MICROSCOPIC - Abnormal; Notable for the following:    APPearance HAZY (*)    Specific Gravity, Urine >1.030 (*)    All other components within normal limits  CBC WITH DIFFERENTIAL - Abnormal; Notable for  the following:    Hemoglobin 11.9 (*)    MCV 75.0 (*)    MCH 23.4 (*)    RDW 17.3 (*)    All other components within normal limits  COMPREHENSIVE METABOLIC PANEL - Abnormal; Notable for the following:    Potassium 3.5 (*)    Total Bilirubin 0.2 (*)    All other components within normal limits  LIPASE, BLOOD  POC URINE PREG, ED     MDM   Final diagnoses:  Abdominal pain    Nursing notes including past medical history and social history reviewed and considered in documentation Labs/vital reviewed and considered [place] narc[place] reviewed   I personally performed the services described in this documentation, which was scribed in my presence. The recorded information has been reviewed and is accurate.      Faith Gaskins, MD 12/23/13 [phone]

## 2013-12-22 NOTE — ED Notes (Signed)
Persistent GB pain that she is to get out next month.  States she ran out of pain medicine and needed more.  States her MD in [place] wouldn't call in pain medicine.

## 2013-12-23 LAB — URINALYSIS, ROUTINE W REFLEX MICROSCOPIC
Bilirubin Urine: NEGATIVE
Glucose, UA: NEGATIVE mg/dL
HGB URINE DIPSTICK: NEGATIVE
Ketones, ur: NEGATIVE mg/dL
Leukocytes, UA: NEGATIVE
Nitrite: NEGATIVE
PROTEIN: NEGATIVE mg/dL
Specific Gravity, Urine: >1.03 — ABNORMAL HIGH (ref 1.005–1.030)
Urobilinogen, UA: 0.2 mg/dL (ref 0.0–1.0)
pH: 6 (ref 5.0–8.0)

## 2013-12-23 LAB — COMPREHENSIVE METABOLIC PANEL
ALBUMIN: 3.8 g/dL (ref 3.5–5.2)
ALT: 17 U/L (ref 0–35)
AST: 15 U/L (ref 0–37)
Alkaline Phosphatase: 71 U/L (ref 39–117)
BUN: 9 mg/dL (ref 6–23)
CO2: 26 mEq/L (ref 19–32)
CREATININE: 0.66 mg/dL (ref 0.50–1.10)
Calcium: 9.4 mg/dL (ref 8.4–10.5)
Chloride: 99 mEq/L (ref 96–112)
GFR calc Af Amer: >90 mL/min (ref >90)
GFR calc non Af Amer: >90 mL/min (ref >90)
Glucose, Bld: 97 mg/dL (ref 70–99)
Potassium: 3.5 mEq/L — ABNORMAL LOW (ref 3.7–5.3)
SODIUM: 137 meq/L (ref 137–147)
TOTAL PROTEIN: 7.8 g/dL (ref 6.0–8.3)
Total Bilirubin: 0.2 mg/dL — ABNORMAL LOW (ref 0.3–1.2)

## 2013-12-23 LAB — LIPASE, BLOOD: Lipase: 32 U/L (ref 11–59)

## 2013-12-23 NOTE — Discharge Instructions (Signed)

## 2014-03-18 ENCOUNTER — Emergency Department: Payer: Self-pay | Admitting: Emergency Medicine

## 2014-03-18 LAB — CBC WITH DIFFERENTIAL/PLATELET
BASOS ABS: 0.1 10*3/uL (ref 0.0–0.1)
Basophil %: 0.6 %
EOS PCT: 3 %
Eosinophil #: 0.4 10*3/uL (ref 0.0–0.7)
HCT: 37.2 % (ref 35.0–47.0)
HGB: 11.7 g/dL — ABNORMAL LOW (ref 12.0–16.0)
Lymphocyte #: 2.7 10*3/uL (ref 1.0–3.6)
Lymphocyte %: 22.2 %
MCH: 23.2 pg — AB (ref 26.0–34.0)
MCHC: 31.5 g/dL — ABNORMAL LOW (ref 32.0–36.0)
MCV: 74 fL — ABNORMAL LOW (ref 80–100)
MONO ABS: 0.8 x10 3/mm (ref 0.2–0.9)
Monocyte %: 6.3 %
NEUTROS PCT: 67.9 %
Neutrophil #: 8.2 10*3/uL — ABNORMAL HIGH (ref 1.4–6.5)
Platelet: 339 10*3/uL (ref 150–440)
RBC: 5.04 10*6/uL (ref 3.80–5.20)
RDW: 16.8 % — ABNORMAL HIGH (ref 11.5–14.5)
WBC: 12.1 10*3/uL — AB (ref 3.6–11.0)

## 2014-03-18 LAB — URINALYSIS, COMPLETE
Bilirubin,UR: NEGATIVE
Blood: NEGATIVE
Glucose,UR: NEGATIVE mg/dL (ref 0–75)
Ketone: NEGATIVE
Nitrite: NEGATIVE
PROTEIN: NEGATIVE
Ph: 7 (ref 4.5–8.0)
Specific Gravity: 1.024 (ref 1.003–1.030)
Squamous Epithelial: 30
WBC UR: 7 /HPF (ref 0–5)

## 2014-03-18 LAB — COMPREHENSIVE METABOLIC PANEL
ALBUMIN: 3.3 g/dL — AB (ref 3.4–5.0)
ALT: 17 U/L (ref 12–78)
Alkaline Phosphatase: 77 U/L
Anion Gap: 7 (ref 7–16)
BILIRUBIN TOTAL: 0.2 mg/dL (ref 0.2–1.0)
BUN: 9 mg/dL (ref 7–18)
CALCIUM: 8.4 mg/dL — AB (ref 8.5–10.1)
CHLORIDE: 108 mmol/L — AB (ref 98–107)
Co2: 24 mmol/L (ref 21–32)
Creatinine: 0.66 mg/dL (ref 0.60–1.30)
EGFR (African American): >60
Glucose: 107 mg/dL — ABNORMAL HIGH (ref 65–99)
Osmolality: 277 (ref 275–301)
Potassium: 3.8 mmol/L (ref 3.5–5.1)
SGOT(AST): 9 U/L — ABNORMAL LOW (ref 15–37)
Sodium: 139 mmol/L (ref 136–145)
Total Protein: 7.1 g/dL (ref 6.4–8.2)

## 2014-03-18 LAB — PREGNANCY, URINE: Pregnancy Test, Urine: NEGATIVE m[IU]/mL

## 2014-03-22 ENCOUNTER — Emergency Department: Payer: Self-pay | Admitting: Emergency Medicine

## 2014-03-22 LAB — URINALYSIS, COMPLETE
Bacteria: NONE SEEN
Bilirubin,UR: NEGATIVE
Blood: NEGATIVE
Glucose,UR: NEGATIVE mg/dL (ref 0–75)
KETONE: NEGATIVE
NITRITE: NEGATIVE
Ph: 7 (ref 4.5–8.0)
Protein: NEGATIVE
SPECIFIC GRAVITY: 1.02 (ref 1.003–1.030)
Squamous Epithelial: 6

## 2014-05-08 ENCOUNTER — Emergency Department: Payer: Self-pay | Admitting: Emergency Medicine

## 2014-05-08 LAB — CBC
HCT: 38.3 % (ref 35.0–47.0)
HGB: 12.2 g/dL (ref 12.0–16.0)
MCH: 23.6 pg — AB (ref 26.0–34.0)
MCHC: 31.9 g/dL — ABNORMAL LOW (ref 32.0–36.0)
MCV: 74 fL — AB (ref 80–100)
PLATELETS: 352 10*3/uL (ref 150–440)
RBC: 5.17 10*6/uL (ref 3.80–5.20)
RDW: 18.1 % — ABNORMAL HIGH (ref 11.5–14.5)
WBC: 12.1 10*3/uL — AB (ref 3.6–11.0)

## 2014-05-08 LAB — URINALYSIS, COMPLETE
GLUCOSE, UR: NEGATIVE mg/dL (ref 0–75)
Ketone: NEGATIVE
LEUKOCYTE ESTERASE: NEGATIVE
NITRITE: NEGATIVE
PROTEIN: NEGATIVE
Ph: 6 (ref 4.5–8.0)
RBC,UR: 5 /HPF (ref 0–5)
SPECIFIC GRAVITY: 1.028 (ref 1.003–1.030)
Squamous Epithelial: 21

## 2014-05-08 LAB — COMPREHENSIVE METABOLIC PANEL
ALBUMIN: 3.4 g/dL (ref 3.4–5.0)
ALK PHOS: 79 U/L
AST: 36 U/L (ref 15–37)
Anion Gap: 6 — ABNORMAL LOW (ref 7–16)
BILIRUBIN TOTAL: 0.3 mg/dL (ref 0.2–1.0)
BUN: 8 mg/dL (ref 7–18)
CHLORIDE: 107 mmol/L (ref 98–107)
Calcium, Total: 8.7 mg/dL (ref 8.5–10.1)
Co2: 27 mmol/L (ref 21–32)
Creatinine: 0.63 mg/dL (ref 0.60–1.30)
EGFR (African American): >60
Glucose: 93 mg/dL (ref 65–99)
Osmolality: 277 (ref 275–301)
Potassium: 4.2 mmol/L (ref 3.5–5.1)
SGPT (ALT): 33 U/L
Sodium: 140 mmol/L (ref 136–145)
Total Protein: 7.6 g/dL (ref 6.4–8.2)

## 2014-05-08 LAB — CARBAMAZEPINE LEVEL, TOTAL: Carbamazepine: <0.5 ug/mL — ABNORMAL LOW (ref 4.0–12.0)

## 2014-05-17 ENCOUNTER — Emergency Department: Payer: Self-pay | Admitting: Emergency Medicine

## 2014-05-17 LAB — COMPREHENSIVE METABOLIC PANEL
ALK PHOS: 81 U/L
AST: 22 U/L (ref 15–37)
Albumin: 3.5 g/dL (ref 3.4–5.0)
Anion Gap: 9 (ref 7–16)
BUN: 10 mg/dL (ref 7–18)
Bilirubin,Total: 0.2 mg/dL (ref 0.2–1.0)
CALCIUM: 8.5 mg/dL (ref 8.5–10.1)
CREATININE: 0.76 mg/dL (ref 0.60–1.30)
Chloride: 107 mmol/L (ref 98–107)
Co2: 24 mmol/L (ref 21–32)
EGFR (Non-African Amer.): >60
GLUCOSE: 101 mg/dL — AB (ref 65–99)
OSMOLALITY: 279 (ref 275–301)
Potassium: 3.9 mmol/L (ref 3.5–5.1)
SGPT (ALT): 25 U/L
SODIUM: 140 mmol/L (ref 136–145)
Total Protein: 7.9 g/dL (ref 6.4–8.2)

## 2014-05-17 LAB — URINALYSIS, COMPLETE
BLOOD: NEGATIVE
Bilirubin,UR: NEGATIVE
GLUCOSE, UR: NEGATIVE mg/dL (ref 0–75)
Ketone: NEGATIVE
Nitrite: NEGATIVE
Ph: 6 (ref 4.5–8.0)
Protein: 30
Specific Gravity: 1.028 (ref 1.003–1.030)
Squamous Epithelial: 69

## 2014-05-17 LAB — CBC WITH DIFFERENTIAL/PLATELET
BASOS ABS: 0.1 10*3/uL (ref 0.0–0.1)
BASOS PCT: 0.7 %
EOS ABS: 0.4 10*3/uL (ref 0.0–0.7)
Eosinophil %: 2.6 %
HCT: 40.6 % (ref 35.0–47.0)
HGB: 12.9 g/dL (ref 12.0–16.0)
Lymphocyte #: 2.8 10*3/uL (ref 1.0–3.6)
Lymphocyte %: 19.7 %
MCH: 23.9 pg — AB (ref 26.0–34.0)
MCHC: 31.7 g/dL — AB (ref 32.0–36.0)
MCV: 75 fL — ABNORMAL LOW (ref 80–100)
MONO ABS: 0.8 x10 3/mm (ref 0.2–0.9)
Monocyte %: 5.7 %
NEUTROS ABS: 10.3 10*3/uL — AB (ref 1.4–6.5)
NEUTROS PCT: 71.3 %
Platelet: 386 10*3/uL (ref 150–440)
RBC: 5.39 10*6/uL — AB (ref 3.80–5.20)
RDW: 17.7 % — ABNORMAL HIGH (ref 11.5–14.5)
WBC: 14.4 10*3/uL — AB (ref 3.6–11.0)

## 2014-05-17 LAB — LIPASE, BLOOD: LIPASE: 154 U/L (ref 73–393)

## 2014-06-11 ENCOUNTER — Emergency Department: Payer: Self-pay | Admitting: Emergency Medicine

## 2014-06-11 LAB — CBC WITH DIFFERENTIAL/PLATELET
BASOS PCT: 0.6 %
Basophil #: 0.1 10*3/uL (ref 0.0–0.1)
Eosinophil #: 0.3 10*3/uL (ref 0.0–0.7)
Eosinophil %: 2.6 %
HCT: 41.3 % (ref 35.0–47.0)
HGB: 12.8 g/dL (ref 12.0–16.0)
LYMPHS ABS: 2.4 10*3/uL (ref 1.0–3.6)
Lymphocyte %: 24.1 %
MCH: 23.7 pg — ABNORMAL LOW (ref 26.0–34.0)
MCHC: 31 g/dL — ABNORMAL LOW (ref 32.0–36.0)
MCV: 76 fL — ABNORMAL LOW (ref 80–100)
Monocyte #: 0.6 x10 3/mm (ref 0.2–0.9)
Monocyte %: 5.7 %
NEUTROS ABS: 6.8 10*3/uL — AB (ref 1.4–6.5)
NEUTROS PCT: 67 %
PLATELETS: 326 10*3/uL (ref 150–440)
RBC: 5.4 10*6/uL — ABNORMAL HIGH (ref 3.80–5.20)
RDW: 17.8 % — ABNORMAL HIGH (ref 11.5–14.5)
WBC: 10.2 10*3/uL (ref 3.6–11.0)

## 2014-06-11 LAB — COMPREHENSIVE METABOLIC PANEL
ALBUMIN: 3.5 g/dL (ref 3.4–5.0)
ALK PHOS: 73 U/L
ANION GAP: 5 — AB (ref 7–16)
BUN: 11 mg/dL (ref 7–18)
Bilirubin,Total: 0.2 mg/dL (ref 0.2–1.0)
CALCIUM: 8.6 mg/dL (ref 8.5–10.1)
Chloride: 109 mmol/L — ABNORMAL HIGH (ref 98–107)
Co2: 23 mmol/L (ref 21–32)
Creatinine: 0.74 mg/dL (ref 0.60–1.30)
Glucose: 111 mg/dL — ABNORMAL HIGH (ref 65–99)
Osmolality: 274 (ref 275–301)
Potassium: 3.8 mmol/L (ref 3.5–5.1)
SGOT(AST): 22 U/L (ref 15–37)
SGPT (ALT): 19 U/L
Sodium: 137 mmol/L (ref 136–145)
TOTAL PROTEIN: 7.4 g/dL (ref 6.4–8.2)

## 2014-06-11 LAB — URINALYSIS, COMPLETE
BACTERIA: NONE SEEN
BILIRUBIN, UR: NEGATIVE
BLOOD: NEGATIVE
GLUCOSE, UR: NEGATIVE mg/dL (ref 0–75)
KETONE: NEGATIVE
Nitrite: NEGATIVE
PROTEIN: NEGATIVE
Ph: 5 (ref 4.5–8.0)
RBC, UR: NONE SEEN /HPF (ref 0–5)
Specific Gravity: 1.023 (ref 1.003–1.030)
WBC UR: 2 /HPF (ref 0–5)

## 2014-06-14 ENCOUNTER — Ambulatory Visit: Payer: Self-pay | Admitting: Family Medicine

## 2014-06-24 ENCOUNTER — Emergency Department: Payer: Self-pay | Admitting: Emergency Medicine

## 2014-06-30 ENCOUNTER — Emergency Department: Payer: Self-pay | Admitting: Emergency Medicine

## 2014-06-30 LAB — URINALYSIS, COMPLETE
BACTERIA: NONE SEEN
BILIRUBIN, UR: NEGATIVE
Glucose,UR: NEGATIVE mg/dL (ref 0–75)
Ketone: NEGATIVE
NITRITE: NEGATIVE
Ph: 8 (ref 4.5–8.0)
RBC,UR: 7618 /HPF (ref 0–5)
Specific Gravity: 1.008 (ref 1.003–1.030)
Squamous Epithelial: 4
WBC UR: 23 /HPF (ref 0–5)

## 2014-06-30 LAB — DRUG SCREEN, URINE

## 2014-06-30 LAB — COMPREHENSIVE METABOLIC PANEL
ALBUMIN: 3.6 g/dL (ref 3.4–5.0)
AST: 20 U/L (ref 15–37)
Alkaline Phosphatase: 75 U/L
Anion Gap: 8 (ref 7–16)
BUN: 10 mg/dL (ref 7–18)
Bilirubin,Total: 0.3 mg/dL (ref 0.2–1.0)
CHLORIDE: 109 mmol/L — AB (ref 98–107)
Calcium, Total: 8.4 mg/dL — ABNORMAL LOW (ref 8.5–10.1)
Co2: 23 mmol/L (ref 21–32)
Creatinine: 0.51 mg/dL — ABNORMAL LOW (ref 0.60–1.30)
EGFR (Non-African Amer.): >60
Glucose: 103 mg/dL — ABNORMAL HIGH (ref 65–99)
Osmolality: 279 (ref 275–301)
Potassium: 3.9 mmol/L (ref 3.5–5.1)
SGPT (ALT): 25 U/L
SODIUM: 140 mmol/L (ref 136–145)
Total Protein: 7.6 g/dL (ref 6.4–8.2)

## 2014-06-30 LAB — CBC
HCT: 41.6 % (ref 35.0–47.0)
HGB: 13.1 g/dL (ref 12.0–16.0)
MCH: 23.9 pg — AB (ref 26.0–34.0)
MCHC: 31.4 g/dL — AB (ref 32.0–36.0)
MCV: 76 fL — ABNORMAL LOW (ref 80–100)
Platelet: 376 10*3/uL (ref 150–440)
RBC: 5.47 10*6/uL — ABNORMAL HIGH (ref 3.80–5.20)
RDW: 17.4 % — ABNORMAL HIGH (ref 11.5–14.5)
WBC: 12.6 10*3/uL — AB (ref 3.6–11.0)

## 2014-06-30 LAB — SALICYLATE LEVEL: Salicylates, Serum: 1.9 mg/dL

## 2014-06-30 LAB — ACETAMINOPHEN LEVEL

## 2014-06-30 LAB — ETHANOL: Ethanol: <3 mg/dL

## 2014-07-01 LAB — PROTIME-INR
INR: 1
Prothrombin Time: 12.9 secs (ref 11.5–14.7)

## 2014-07-01 LAB — APTT: Activated PTT: 41 secs — ABNORMAL HIGH (ref 23.6–35.9)

## 2014-07-24 ENCOUNTER — Emergency Department: Payer: Self-pay | Admitting: Student

## 2014-07-24 LAB — COMPREHENSIVE METABOLIC PANEL
ALT: 24 U/L
AST: 17 U/L (ref 15–37)
Albumin: 3.4 g/dL (ref 3.4–5.0)
Alkaline Phosphatase: 79 U/L
Anion Gap: 7 (ref 7–16)
BILIRUBIN TOTAL: 0.2 mg/dL (ref 0.2–1.0)
BUN: 11 mg/dL (ref 7–18)
CREATININE: 0.58 mg/dL — AB (ref 0.60–1.30)
Calcium, Total: 8.5 mg/dL (ref 8.5–10.1)
Chloride: 106 mmol/L (ref 98–107)
Co2: 25 mmol/L (ref 21–32)
Glucose: 93 mg/dL (ref 65–99)
Osmolality: 275 (ref 275–301)
Potassium: 4.2 mmol/L (ref 3.5–5.1)
Sodium: 138 mmol/L (ref 136–145)
TOTAL PROTEIN: 7.4 g/dL (ref 6.4–8.2)

## 2014-07-24 LAB — URINALYSIS, COMPLETE
BILIRUBIN, UR: NEGATIVE
Blood: NEGATIVE
Glucose,UR: NEGATIVE mg/dL (ref 0–75)
Ketone: NEGATIVE
Leukocyte Esterase: NEGATIVE
NITRITE: NEGATIVE
Ph: 7 (ref 4.5–8.0)
Protein: NEGATIVE
Specific Gravity: 1.017 (ref 1.003–1.030)
Squamous Epithelial: 20

## 2014-07-24 LAB — CBC
HCT: 40.7 % (ref 35.0–47.0)
HGB: 12.8 g/dL (ref 12.0–16.0)
MCH: 24.5 pg — AB (ref 26.0–34.0)
MCHC: 31.5 g/dL — ABNORMAL LOW (ref 32.0–36.0)
MCV: 78 fL — AB (ref 80–100)
Platelet: 354 10*3/uL (ref 150–440)
RBC: 5.25 10*6/uL — AB (ref 3.80–5.20)
RDW: 16.4 % — ABNORMAL HIGH (ref 11.5–14.5)
WBC: 13.4 10*3/uL — AB (ref 3.6–11.0)

## 2014-07-24 LAB — LIPASE, BLOOD: LIPASE: 86 U/L (ref 73–393)

## 2014-07-24 LAB — TROPONIN I

## 2014-07-27 ENCOUNTER — Emergency Department: Payer: Self-pay | Admitting: Emergency Medicine

## 2014-07-27 LAB — COMPREHENSIVE METABOLIC PANEL
ALK PHOS: 77 U/L
ALT: 22 U/L
AST: 20 U/L (ref 15–37)
Albumin: 3.7 g/dL (ref 3.4–5.0)
Anion Gap: 12 (ref 7–16)
BUN: 11 mg/dL (ref 7–18)
Bilirubin,Total: 0.2 mg/dL (ref 0.2–1.0)
CO2: 23 mmol/L (ref 21–32)
Calcium, Total: 8.5 mg/dL (ref 8.5–10.1)
Chloride: 105 mmol/L (ref 98–107)
Creatinine: 0.63 mg/dL (ref 0.60–1.30)
EGFR (African American): >60
EGFR (Non-African Amer.): >60
Glucose: 97 mg/dL (ref 65–99)
Osmolality: 279 (ref 275–301)
POTASSIUM: 3.5 mmol/L (ref 3.5–5.1)
Sodium: 140 mmol/L (ref 136–145)
Total Protein: 7.5 g/dL (ref 6.4–8.2)

## 2014-07-27 LAB — CBC
HCT: 39.4 % (ref 35.0–47.0)
HGB: 12.4 g/dL (ref 12.0–16.0)
MCH: 24 pg — ABNORMAL LOW (ref 26.0–34.0)
MCHC: 31.5 g/dL — ABNORMAL LOW (ref 32.0–36.0)
MCV: 76 fL — ABNORMAL LOW (ref 80–100)
PLATELETS: 377 10*3/uL (ref 150–440)
RBC: 5.18 10*6/uL (ref 3.80–5.20)
RDW: 16.9 % — ABNORMAL HIGH (ref 11.5–14.5)
WBC: 16 10*3/uL — AB (ref 3.6–11.0)

## 2014-07-28 LAB — URINALYSIS, COMPLETE
BLOOD: NEGATIVE
Bilirubin,UR: NEGATIVE
GLUCOSE, UR: NEGATIVE mg/dL (ref 0–75)
LEUKOCYTE ESTERASE: NEGATIVE
Nitrite: NEGATIVE
Ph: 6 (ref 4.5–8.0)
Protein: NEGATIVE
RBC,UR: 1 /HPF (ref 0–5)
Specific Gravity: 1.03 (ref 1.003–1.030)
WBC UR: 4 /HPF (ref 0–5)

## 2014-08-05 ENCOUNTER — Emergency Department: Payer: Self-pay | Admitting: Emergency Medicine

## 2014-08-05 LAB — CBC WITH DIFFERENTIAL/PLATELET
BASOS ABS: 0.1 10*3/uL (ref 0.0–0.1)
BASOS PCT: 0.5 %
Eosinophil #: 0.1 10*3/uL (ref 0.0–0.7)
Eosinophil %: 0.7 %
HCT: 40.5 % (ref 35.0–47.0)
HGB: 13 g/dL (ref 12.0–16.0)
Lymphocyte #: 1.9 10*3/uL (ref 1.0–3.6)
Lymphocyte %: 12.1 %
MCH: 24.7 pg — AB (ref 26.0–34.0)
MCHC: 32 g/dL (ref 32.0–36.0)
MCV: 77 fL — ABNORMAL LOW (ref 80–100)
Monocyte #: 0.8 x10 3/mm (ref 0.2–0.9)
Monocyte %: 4.9 %
Neutrophil #: 12.7 10*3/uL — ABNORMAL HIGH (ref 1.4–6.5)
Neutrophil %: 81.8 %
Platelet: 353 10*3/uL (ref 150–440)
RBC: 5.25 10*6/uL — ABNORMAL HIGH (ref 3.80–5.20)
RDW: 16.7 % — AB (ref 11.5–14.5)
WBC: 15.5 10*3/uL — ABNORMAL HIGH (ref 3.6–11.0)

## 2014-08-05 LAB — DRUG SCREEN, URINE

## 2014-08-05 LAB — MAGNESIUM: Magnesium: 1.8 mg/dL

## 2014-08-05 LAB — URINALYSIS, COMPLETE
Bacteria: NONE SEEN
Bilirubin,UR: NEGATIVE
Glucose,UR: NEGATIVE mg/dL (ref 0–75)
Ketone: NEGATIVE
LEUKOCYTE ESTERASE: NEGATIVE
NITRITE: NEGATIVE
Ph: 6 (ref 4.5–8.0)
Protein: NEGATIVE
RBC,UR: 1 /HPF (ref 0–5)
Specific Gravity: 1.024 (ref 1.003–1.030)
WBC UR: <1 /HPF (ref 0–5)

## 2014-08-05 LAB — COMPREHENSIVE METABOLIC PANEL
ALBUMIN: 3.7 g/dL (ref 3.4–5.0)
ALK PHOS: 71 U/L
ANION GAP: 8 (ref 7–16)
AST: 10 U/L — AB (ref 15–37)
BUN: 10 mg/dL (ref 7–18)
Bilirubin,Total: 0.3 mg/dL (ref 0.2–1.0)
CALCIUM: 8.5 mg/dL (ref 8.5–10.1)
CHLORIDE: 106 mmol/L (ref 98–107)
CO2: 24 mmol/L (ref 21–32)
Creatinine: 0.56 mg/dL — ABNORMAL LOW (ref 0.60–1.30)
EGFR (African American): >60
GLUCOSE: 96 mg/dL (ref 65–99)
OSMOLALITY: 275 (ref 275–301)
Potassium: 3.9 mmol/L (ref 3.5–5.1)
SGPT (ALT): 18 U/L
Sodium: 138 mmol/L (ref 136–145)
TOTAL PROTEIN: 7.1 g/dL (ref 6.4–8.2)

## 2014-08-05 LAB — LIPASE, BLOOD: Lipase: 102 U/L (ref 73–393)

## 2014-08-05 LAB — HCG, QUANTITATIVE, PREGNANCY: Beta Hcg, Quant.: <1 m[IU]/mL — ABNORMAL LOW

## 2014-08-17 ENCOUNTER — Emergency Department (HOSPITAL_COMMUNITY)
Admission: EM | Admit: 2014-08-17 | Discharge: 2014-08-17 | Disposition: A | Discharge status: Home / Self Care | Payer: Medicaid Other | Attending: Emergency Medicine | Admitting: Emergency Medicine

## 2014-08-17 ENCOUNTER — Encounter (HOSPITAL_COMMUNITY): Payer: Self-pay | Admitting: Emergency Medicine

## 2014-08-17 ENCOUNTER — Emergency Department (HOSPITAL_COMMUNITY): Payer: Medicaid Other

## 2014-08-17 DIAGNOSIS — Z8719 Personal history of other diseases of the digestive system: Secondary | ICD-10-CM | POA: Diagnosis not present

## 2014-08-17 DIAGNOSIS — R011 Cardiac murmur, unspecified: Secondary | ICD-10-CM | POA: Diagnosis not present

## 2014-08-17 DIAGNOSIS — Z720 Tobacco use: Secondary | ICD-10-CM | POA: Insufficient documentation

## 2014-08-17 DIAGNOSIS — R079 Chest pain, unspecified: Secondary | ICD-10-CM | POA: Diagnosis not present

## 2014-08-17 DIAGNOSIS — E119 Type 2 diabetes mellitus without complications: Secondary | ICD-10-CM | POA: Diagnosis not present

## 2014-08-17 DIAGNOSIS — R05 Cough: Secondary | ICD-10-CM | POA: Diagnosis not present

## 2014-08-17 DIAGNOSIS — Z8742 Personal history of other diseases of the female genital tract: Secondary | ICD-10-CM | POA: Insufficient documentation

## 2014-08-17 DIAGNOSIS — G43909 Migraine, unspecified, not intractable, without status migrainosus: Secondary | ICD-10-CM | POA: Diagnosis present

## 2014-08-17 DIAGNOSIS — J45909 Unspecified asthma, uncomplicated: Secondary | ICD-10-CM | POA: Insufficient documentation

## 2014-08-17 DIAGNOSIS — G40909 Epilepsy, unspecified, not intractable, without status epilepticus: Secondary | ICD-10-CM | POA: Insufficient documentation

## 2014-08-17 DIAGNOSIS — G43809 Other migraine, not intractable, without status migrainosus: Secondary | ICD-10-CM | POA: Diagnosis not present

## 2014-08-17 DIAGNOSIS — Z79899 Other long term (current) drug therapy: Secondary | ICD-10-CM | POA: Insufficient documentation

## 2014-08-17 DIAGNOSIS — Z91040 Latex allergy status: Secondary | ICD-10-CM | POA: Diagnosis not present

## 2014-08-17 DIAGNOSIS — R059 Cough, unspecified: Secondary | ICD-10-CM

## 2014-08-17 HISTORY — DX: Migraine, unspecified, not intractable, without status migrainosus: G43.909

## 2014-08-17 LAB — COMPREHENSIVE METABOLIC PANEL
ALBUMIN: 4.1 g/dL (ref 3.5–5.2)
ALK PHOS: 71 U/L (ref 39–117)
ALT: 16 U/L (ref 0–35)
AST: 13 U/L (ref 0–37)
Anion gap: 13 (ref 5–15)
BILIRUBIN TOTAL: 0.2 mg/dL — AB (ref 0.3–1.2)
BUN: 12 mg/dL (ref 6–23)
CHLORIDE: 103 meq/L (ref 96–112)
CO2: 25 mEq/L (ref 19–32)
Calcium: 9.3 mg/dL (ref 8.4–10.5)
Creatinine, Ser: 0.66 mg/dL (ref 0.50–1.10)
GFR calc non Af Amer: >90 mL/min (ref >90)
GLUCOSE: 96 mg/dL (ref 70–99)
POTASSIUM: 3.9 meq/L (ref 3.7–5.3)
Sodium: 141 mEq/L (ref 137–147)
Total Protein: 7.7 g/dL (ref 6.0–8.3)

## 2014-08-17 LAB — CBC
HEMATOCRIT: 42.5 % (ref 36.0–46.0)
HEMOGLOBIN: 13.9 g/dL (ref 12.0–15.0)
MCH: 25 pg — ABNORMAL LOW (ref 26.0–34.0)
MCHC: 32.7 g/dL (ref 30.0–36.0)
MCV: 76.6 fL — ABNORMAL LOW (ref 78.0–100.0)
Platelets: 352 10*3/uL (ref 150–400)
RBC: 5.55 MIL/uL — ABNORMAL HIGH (ref 3.87–5.11)
RDW: 15.6 % — ABNORMAL HIGH (ref 11.5–15.5)
WBC: 10.3 10*3/uL (ref 4.0–10.5)

## 2014-08-17 LAB — I-STAT BETA HCG BLOOD, ED (MC, WL, AP ONLY): I-stat hCG, quantitative: <5 m[IU]/mL (ref <5)

## 2014-08-17 MED ORDER — METOCLOPRAMIDE HCL 5 MG/ML IJ SOLN
10.0000 mg | Freq: Once | INTRAMUSCULAR | Status: AC
  Administered 2014-08-17: 10 mg via INTRAVENOUS
  Filled 2014-08-17: qty 2

## 2014-08-17 MED ORDER — SODIUM CHLORIDE 0.9 % IV SOLN
1000.0000 mL | Freq: Once | INTRAVENOUS | Status: AC
  Administered 2014-08-17: 1000 mL via INTRAVENOUS

## 2014-08-17 MED ORDER — FENTANYL CITRATE 0.05 MG/ML IJ SOLN
50.0000 ug | Freq: Once | INTRAMUSCULAR | Status: AC
  Administered 2014-08-17: 50 ug via INTRAVENOUS
  Filled 2014-08-17: qty 2

## 2014-08-17 MED ORDER — ONDANSETRON HCL 4 MG/2ML IJ SOLN
4.0000 mg | Freq: Once | INTRAMUSCULAR | Status: AC
  Administered 2014-08-17: 4 mg via INTRAVENOUS
  Filled 2014-08-17: qty 2

## 2014-08-17 MED ORDER — SODIUM CHLORIDE 0.9 % IV SOLN: 1000.0000 mL | INTRAVENOUS | Status: DC

## 2014-08-17 MED ORDER — ALBUTEROL SULFATE HFA 108 (90 BASE) MCG/ACT IN AERS
2.0000 | INHALATION_SPRAY | Freq: Once | RESPIRATORY_TRACT | Status: AC
  Administered 2014-08-17: 2 via RESPIRATORY_TRACT
  Filled 2014-08-17: qty 6.7

## 2014-08-17 NOTE — ED Notes (Addendum)
Pt reports headache, intermittent productive cough and vomiting for last several days. Pt denies any known fevers. nad noted.pt reports light and sound sensitivity.

## 2014-08-17 NOTE — Discharge Instructions (Signed)

## 2014-08-17 NOTE — ED Provider Notes (Signed)
CSN: [id number]     Arrival date & time 08/17/14  1733 History  This chart was scribed for Faith CoKevin M Luma Clopper, MD by Gwenyth Oberatherine Macek, ED Scribe. This patient was seen in room APA04/APA04 and the patient's care was started at 6:28 PM.    Chief Complaint  Patient presents with  . Migraine   The history is provided by the patient. No language interpreter was used.   HPI Comments: Faith [name] is a 23 y.o. female who presents to the Emergency Department complaining of gradually worsening, constant head discomfort that started 2 weeks ago. She notes productive cough with green mucous that started a few days ago, SOB, ear pain, vomiting and generalized weakness as associated symptoms. Pt has tried Tylenol, Ibuprofen and Mucinex with no relief. Pt has visited ED at [facility] for symptoms where they took a CT Head. She notes that she has a  history of migraines and states that current pain is similar, but more severe. Pt sees neurologist Dr. Clelia CroftShaw in [place]. She smokes cigarettes. Pt denies fever and chills as associated symtpoms.  Past Medical History  Diagnosis Date  . Asthma   . Seizures   . Diabetes mellitus without complication   . Tubal pregnancy   . Heart murmur   . Ovarian cyst   . Gallstones   . Migraines    Past Surgical History  Procedure Laterality Date  . Dilation and curettage of uterus    . Ovarian cyst removed    . Tonsillectomy    . Tonsillectomy and adenoidectomy     History reviewed. No pertinent family history. History  Substance Use Topics  . Smoking status: Current Every Day Smoker -- 0.50 packs/day for 3 years    Types: Cigarettes  . Smokeless tobacco: Not on file  . Alcohol Use: No     Comment: occ   OB History    No data available     Review of Systems  10 Systems reviewed and all are negative for acute change except as noted in the HPI.  Allergies  Morphine and related; Tomato; Toradol; Iohexol; Lamictal; Sulfa antibiotics; Latex;  Mushroom extract complex; and Tramadol  Home Medications   Prior to Admission medications   Medication Sig Start Date End Date Taking? Authorizing Provider  albuterol (PROVENTIL HFA;VENTOLIN HFA) 108 (90 BASE) MCG/ACT inhaler Inhale 2 puffs into the lungs every 6 (six) hours as needed. For shortness of breath    Historical Provider, MD  carbamazepine (TEGRETOL) 200 MG tablet Take 200 mg by mouth 3 (three) times daily.    Historical Provider, MD  clonazePAM (KLONOPIN) 1 MG tablet Take 1 tablet (1 mg total) by mouth 2 (two) times daily. For anxiety and panic. 06/19/13   Verne SpurrNeil Mashburn, PA-C  FLUoxetine (PROZAC) 20 MG capsule Take 1 capsule (20 mg total) by mouth daily. For anxiety and depression. 06/19/13   Verne SpurrNeil Mashburn, PA-C  ibuprofen (ADVIL,MOTRIN) 200 MG tablet Take 400 mg by mouth every 6 (six) hours as needed for pain.    Historical Provider, MD  levETIRAcetam (KEPPRA) 1000 MG tablet Take 1 tablet (1,000 mg total) by mouth 2 (two) times daily. For seizures. 06/19/13   Verne SpurrNeil Mashburn, PA-C  metFORMIN (GLUCOPHAGE) 500 MG tablet Take 1 tablet (500 mg total) by mouth 2 (two) times daily with a meal. For type 2 diabetes. 06/19/13   Verne SpurrNeil Mashburn, PA-C  Vitamin D, Ergocalciferol, (DRISDOL) 50000 UNITS CAPS capsule Take 1 capsule (50,000 Units total) by mouth every 7 (  seven) days. Takes Wednesday for low vitamin D. 06/19/13   Verne SpurrNeil Mashburn, PA-C   BP 135/77 mmHg  Pulse 104  Temp(Src) 98.1 F (36.7 C)  Ht 5\' 1"  (1.549 m)  SpO2 100%  LMP 08/10/2014 Physical Exam  Constitutional: She is oriented to person, place, and time. She appears well-developed and well-nourished. No distress.  HENT:  Head: Normocephalic and atraumatic.  Eyes: EOM are normal. Pupils are equal, round, and reactive to light.  Neck: Normal range of motion.  Cardiovascular: Normal rate, regular rhythm and normal heart sounds.   Pulmonary/Chest: Effort normal and breath sounds normal. She has no wheezes.  Rhonchi bilaterally, no  wheezing   Abdominal: Soft. She exhibits no distension. There is no tenderness.  Musculoskeletal: Normal range of motion.  Neurological: She is alert and oriented to person, place, and time.  5/5 strength in major muscle groups of  bilateral upper and lower extremities. Speech normal. No facial asymetry.   Skin: Skin is warm and dry.  Psychiatric: She has a normal mood and affect. Judgment normal.  Nursing note and vitals reviewed.   ED Course  Procedures (including critical care time) DIAGNOSTIC STUDIES: Oxygen Saturation is 100% on RA, normal by my interpretation.    COORDINATION OF CARE: 6:34 PM Discussed treatment plan with pt which includes lab work, chest x-ray, and IV fluids and pt agreed to plan.  Labs Review Labs Reviewed - No data to display  Imaging Review No results found.   EKG Interpretation None      MDM   Final diagnoses:  Chest pain  Cough  Other type of migraine    Patient feels much better at this time.  Discharge home in good condition.  PCP follow-up.  Referral back to her neurologist.  Home with anti-inflammatories.  Suspect bronchitis given her productive cough.  Chest x-ray normal.  Discharge home in good condition.  I personally performed the services described in this documentation, which was scribed in my presence. The recorded information has been reviewed and is accurate.       Faith CoKevin M Ewin Rehberg, MD 08/17/14 2001

## 2015-01-20 DIAGNOSIS — IMO0002 Reserved for concepts with insufficient information to code with codable children: Secondary | ICD-10-CM | POA: Insufficient documentation
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
## 2015-01-25 NOTE — Discharge Summary (Signed)
PATIENT NAME:  Faith [name], Faith [name] MR#:  [id number] DATE OF BIRTH:  [birth year]  HISTORY OF PRESENT ILLNESS: She was admitted July 30, 2013, by Dr. Allena KatzPatel and she signed out AMA.   She comes back on October 27 with a history of going back to the [facility]'s shelter where she is a current resident and having seizures. The patient had a witness, her sister, who said that the seizures were very evident, had some headaches. She was a little bit postictal after the events. The patient has no urine or incontinence, no tongue biting. Apparently she was recently hospitalized at [facility] where she states she was unconscious for 3 weeks. The patient has been diagnosed with pseudoseizures, with depression, with self-inflicted behavior in the past, suicidal ideation in the past, seizure disorder since the age of 89, but also some type of bipolar disorder and maybe a conversion disorder as well.   In the past, she has been in the Emergency Department. Last week she said she was pregnant. She had an ultrasound and a UCG and she continued to say that she was pregnant despite the fact that those tests were negative. The patient states that after a while that she actually had a miscarriage.  Due to her seizures, the patient had a EEG. She was evaluated by Dr. Mellody DrownMatthew Smith. She has been on Keppra. Previous to that, she was switched to Lamictal, and due to poor control and LAMICTAL GAVE HER A RASH, for which she was put back on Keppra.   At this moment, she is being put on carbamazepine by Dr. Katrinka BlazingSmith and he recommended to be discharged and follow up with [facility].   Tegretol should be 200 mg twice daily for 4 days, then 200 mg three times daily for 4 days, then 400 mg twice daily.   DISCHARGE INSTRUCTIONS: The patient is recommended not to drive and to have at least 8 hours of sleep. Avoid alcohol and flashing lights.   DISCHARGE CONDITION: Good condition.   TIME SPENT: I spent about 40 minutes with this discharge.     ____________________________ Felipa Furnaceoberto Sanchez Gutierrez, MD rsg:np D: 07/31/2013 13:10:47 ET T: 07/31/2013 15:30:04 ET JOB#: [id number]  cc: Felipa Furnaceoberto Sanchez Gutierrez, MD, <Dictator> Aunesty Tyson Juanda ChanceSANCHEZ GUTIERRE MD ELECTRONICALLY SIGNED 08/14/2013 6:45

## 2015-01-25 NOTE — Discharge Summary (Signed)
PATIENT NAME:  Faith BjorkDOLES, [name] MR#:  [id number] DATE OF BIRTH:  [birth year]  DATE OF ADMISSION:  07/31/2013 DATE OF DISCHARGE:  07/31/2013  Discharge AGAINST MEDICAL ADVICE.  Please refer to H and P done earlier for details.  In brief, the patient is a 24 year old Caucasian female who apparently has a history of seizures since the age of 6 years, who was hospitalized at [facility] about 2 weeks ago, according to her report, who presented with recurrent seizures. She was seen in the ED day prior and was loaded with IV Dilantin, which she is supposed to take normally. The patient has been under a lot of stress and is currently living in a women's shelter. She was brought to the ED with recurrent seizures. The patient was admitted for seizure and further evaluation and records were to be obtained from [facility]. There was a high suspicion for pseudoseizures, however, the patient was kept on her Keppra, Keppra levels were ordered, and a neurology and psychiatric evaluation were ordered, as well as an EEG was ordered.  However, the patient kept saying she wanted to go home. I discussed with her multiple times, and she refused to stay in the hospital. At this point, she wants to leave AGAINST MEDICAL ADVICE; therefore, she has been explained the risks of going AGAINST MEDICAL ADVICE and having another seizure. The patient insists on going home at this point, therefore, is being discharged. AGAINST MEDICAL ADVICE.    NOTE:  Time spent total 55 minutes including the H and P for this patient.     ____________________________ Lacie ScottsShreyang H. Allena KatzPatel, MD shp:dmm D: 08/01/2013 12:08:16 ET T: 08/01/2013 12:28:55 ET JOB#: [id number]  cc: Cherlynn Popiel H. Allena KatzPatel, MD, <Dictator> Charise CarwinSHREYANG H Collie Kittel MD ELECTRONICALLY SIGNED 08/04/2013 8:28
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
## 2015-01-25 NOTE — Consult Note (Signed)
Psychiatry: Followup with this patient who is having anxiety related seizure like activity. Earlier in the day she was agitated and having more seizure like activity but this afternoon she appears to calm down significantly. She is lucid and alert and appears to be safe in her thinking and behavior. Plan is for discharge. She has followup already arranged with the community support team from [facility]. Reviewed treatment plan. No change to medicine.  Electronic Signatures: Hazelynn Mckenny, Jackquline DenmarkJohn T (MD)  (Signed on 31-Oct-14 01:05)  Authored  Last Updated: 31-Oct-14 01:05 by Audery Amellapacs, Scotlyn Mccranie T (MD)
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
## 2015-01-25 NOTE — Discharge Summary (Signed)
PATIENT NAME:  Faith BjorkDOLES, Kathrin M MR#:  [id number] DATE OF BIRTH:  03/01/1991  DATE OF ADMISSION:  08/01/2013 DATE OF DISCHARGE:  08/03/2013.   PRIMARY CARE PHYSICIAN:  None local.   CONSULTATION: Neurology, Dr. Katrinka BlazingSmith; Psychiatry, Dr. Toni Amendlapacs.   DISCHARGE DIAGNOSES:  1.  Pseudoseizure.  2.  Seizure disorder.  3.  Hypertension.  4.  Diabetes.  5.  Obesity.  6.  Depression.  7.  Anxiety.   CONDITION:  Stable.   CODE STATUS:  FULL CODE.   HOME MEDICATIONS:  Please refer to the medication reconciliation and [facility] physician's discharge instruction.   DIET:  Low sodium, low fat, low cholesterol, ADA diet.   ACTIVITY:  As tolerated.   FOLLOWUP CARE:  Followup with Dr. Sherryll BurgerShah next week, neurologist. No driving or operating heavy machinery for 6 months. [facility] with [facility] at [facility].   REASON FOR ADMISSION:  Recurrent seizures.   HOSPITAL COURSE:  The patient is a 24 year old Caucasian female with a history of seizure disorder since 89, was recently hospitalized at [facility] about two weeks ago with recurrent seizures. The patient was supposed to be on captopril. The patient came to the ED for seizure again and had another tonic-clonic seizure activity in ED. The patient was given Ativan and Dilantin and admitted for seizure. For detailed history and physical examination, please refer to the admission note dictated by Dr. Auburn BilberryShreyang Patel.  LABORATORY DATA ON ADMISSION DATE:  Showed BUN 10, creatinine 0.77, calcium 8.8. Urine drug screen showed positive for benzodiazepines. WBC 11.5, hemoglobin 9.9. CAT scan of head did not show any acute abnormality.   1.  Recurrent seizures. Dr. Katrinka BlazingSmith, neurologist, evaluated the patient, suggested increasing the Tegretol to 200 mg t.i.d. and increase the Keppra to 1250 mg b.i.d. but discontinuing Dilantin. He suggested no driving or operating heavy machinery for 6 months. In addition Dr. Toni Amendlapacs evaluated the patient for depression and anxiety. He suggested continue  Prozac and Klonopin with Ativan p.r.n. for anxiety and agitation.  2.  The patient has a history of hypertension, diabetes, but the patient developed hypotension due to Ativan treatment. The patient was treated with a normal saline bolus. Blood pressure has been stable.  3.  For diabetes:  The patient has been treated with sliding scale.   Since the patient has no complaints and vital signs are stable, the patient was planned to be discharged back to shelter today but the patient had one episode of pseudoseizure again this morning. The patient said that she was scared of being discharged to a shelter. I discussed with the patient and the patient's sister just now. They have agreed to be discharged today. I discussed the patient's discharge plan with the patient, the patient's sister, case Production designer, theatre/television/filmmanager and nurse.   TIME SPENT:  About 45 minutes.   ____________________________ Shaune PollackQing Dawne Casali, MD qc:jm D: 08/03/2013 15:35:50 ET T: 08/03/2013 17:36:20 ET JOB#: [id number]  cc: Shaune PollackQing Kenzlee Fishburn, MD, <Dictator> Shaune PollackQING Najwa Spillane MD ELECTRONICALLY SIGNED 08/06/2013 12:36
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
## 2015-01-25 NOTE — H&P (Signed)
PATIENT NAME:  Faith [name], Faith [name] MR#:  [id number] DATE OF BIRTH:  29-Jul-1991  DATE OF ADMISSION:  08/01/2013  PRIMARY CARE PROVIDER: None.   EMERGENCY DEPARTMENT REFERRING PHYSICIAN: Dr. Mindi JunkerGottlieb.   CHIEF COMPLAINT: Recurrent seizures.   HISTORY OF PRESENT ILLNESS: The patient is a 24 year old Caucasian female with history of seizure disorder since age of 89, who was recently hospitalized at [facility] about 2 weeks ago with recurrent seizures. This patient is supposed to be on Keppra, which apparently she is compliant with. The patient came in yesterday and was seen in the ED by me for recurrent seizures. The patient was noted to have tonic-clonic seizure activity and postictal state. She was admitted; however, while still in the ED before she could get a room, she started demanding to go back to the facility where she is currently staying at, and she left AGAINST MEDICAL ADVICE. After leaving AGAINST MEDICAL ADVICE, within a few hours she was readmitted with seizure activity. The patient was seen by neurology and was recommended to start on Tegretol. She also had an EEG that did show abnormal EEG with evidence of epileptiform discharges. The patient lives in a women's shelter, today started having again tonic-clonic seizure-type activity, brought to the ED. Here in the ER, she started again having another tonic-clonic seizure activity. She received Ativan and then was given Dilantin. Once she received Dilantin, her seizure activity resolved. Currently, the patient is postictal and is unable to give me any further history, and she is sleepy.   PAST MEDICAL HISTORY: 1.  History of depression, anxiety, self-inflicting behavior in the past, multiple psychiatric admissions, suicidal attempts in the past.  2.  History of seizure disorder since the age of 89.  3.  History of anemia.  4.  History of heart murmur.  5.  Diabetes type 2.  6.  Bipolar disorder.  7.  History of asthma.  8.  ADHD.  9.  Status  post tonsillectomy, status post adenoidectomy.  10.  Status post right ovarian surgery.   ALLERGIES: TO MULTIPLE MEDICATIONS INCLUDING ASPIRIN, DILAUDID, MORPHINE, SULFA, TRAMADOL, VICODIN, MUSHROOMS, TOMATO, GRASS, LATEX.   MEDICATIONS: That she was earlier discharged on: Klonopin 1 mg b.i.d., Prozac 20 daily, ibuprofen 800 mg 1 tab p.o. t.i.d. as needed, Keppra 1000 mg 1 tab p.o. b.i.d., metformin 500 mg 1 tab p.o. daily, Zantac 150 mg 1/2 tab p.o. b.i.d., ProAir 2 puffs 4 times a day as needed, hydroxyzine 25 mg 1 tab 4 times a day as needed for itching, carbamazepine 200 mg 1 tab p.o. b.i.d. for 4 days and then 1 tab p.o. t.i.d. for 4 days and then 2 tabs 2 times a day.   SOCIAL HISTORY: Smokes cigarettes, unknown how much. Does not drink. No drugs according to the history obtained yesterday.   FAMILY HISTORY: The patient was adopted.  REVIEW OF SYSTEMS:  Unobtainable due to the patient being very lethargic.   PHYSICAL EXAMINATION: VITAL SIGNS: Temperature 98.8, pulse 81, respirations 14, blood pressure 140/64, O2 sat 99% (has a full face mask on right now).  HEENT: Head atraumatic, normocephalic. Pupils equally round, reactive to light and accommodation. There is no conjunctival pallor. No scleral icterus. Nasal exam shows no drainage or ulceration. Oropharynx: She is not opening her mouth.  NECK: No JVD. No carotid bruits.  CARDIOVASCULAR: Regular rate and rhythm. No murmurs, rubs, clicks or gallops. PMI is not displaced.  LUNGS: Clear to auscultation bilaterally without any rales, rhonchi, wheezing.  ABDOMEN: Soft, nontender,  nondistended. Positive bowel sounds x 4.  EXTREMITIES: No clubbing, cyanosis or edema.  SKIN: No rash.  LYMPHATICS: No lymph nodes palpable.  VASCULAR: Good DP, PT pulses.  PSYCHIATRIC: The patient is currently very lethargic, but does open her eyes when called her name.  NEUROLOGIC: Was spontaneously moving all extremities earlier and was able to walk earlier in  the day.   LABORATORY AND RADIOLOGICAL DATA: Glucose 104, BUN 10, creatinine 0.77, sodium 140, potassium 3.6, chloride 107, CO2 is 26. Calcium level 8.8. LFTs showed a total protein of 7.5, albumin of 3.6, bilirubin total 0.1, alkaline phosphatase 87, AST 18, ALT is 21. TUDS were positive for benzodiazepines. WBC 11.5, hemoglobin 9.9, platelet count 305. CT scan of the head done yesterday shows no acute abnormality.   ASSESSMENT AND PLAN: This is a patient with recurrent seizures who was discharged yesterday.  1.  Recurrent seizures: At this time, the patient is already on Keppra, which we will continue. Continue Tegretol. I will check a Tegretol level. Will add fosphenytoin IV since the patient is currently sleepy and may not be able to take oral Dilantin. I will ask neurology to come re-evaluate the patient in the morning.  2.  Diabetes: We will place her on sliding scale insulin. Continue metformin. 3.  Hypertension: Continue hydralazine as taking at home.  4.  Depression: Continue Prozac as taking previously.   TIME SPENT: 45 minutes spent on this patient.    ____________________________ Lacie Scotts. Allena Katz, MD shp:jm D: 08/01/2013 20:23:35 ET T: 08/01/2013 21:41:29 ET JOB#: [id number]  cc: Juline Sanderford H. Allena Katz, MD, <Dictator> Charise Carwin MD ELECTRONICALLY SIGNED 08/04/2013 8:30

## 2015-01-26 NOTE — Consult Note (Signed)
PATIENT NAME:  Faith [name], Faith [name] MR#:  [id number] DATE OF BIRTH:  1991/05/23  DATE OF CONSULTATION:  07/01/2014  CONSULTING PHYSICIAN:  Chanya Chrisley K. Taejon Irani, MD  SUBJECTIVE:  The patient was seen in consultation at [facility].  The patient is a 24 year old white female, not employed, not in school, and is not married and lives with her boyfriend and their 24 year old child.  Boyfriend is 24 years old.  All 3 of them live in a house.  The [facility] and according to her the chief complaint is "I've got chest pains."   The reason for referral was "black affect and suicidal ideas."   The patient reports that she complained of chest pain and her boyfriend got worried and brought her here for help.  PAST PSYCHIATRIC HISTORY:  History of inpatient hospitalization to psychiatry about 3 times so far.  Most of the inpatient hospitalization was for behavior problems and anger and cutting behavior.  The patient reports that she is being [facility].  Her last appointment was in 05/2014.  Her next appointment is coming up soon.  She is being followed for depression and PTSD, bipolar and borderline personality disorder.  Alcohol and drugs denied.    MENTAL STATUS EXAMINATION:   The patient is calm, pleasant and cooperative. Alert and oriented.  Denies feeling depressed and suicidal now, denies feeling hopeless or helpless.  She feels that her chest pains are under control; no psychosis, does not appear to be in distress.  Memory is intact.  Cognition intact.  Denies suicidal and homicidal ideation and contracts for safety.  Insight and judgment fair.  Eye and pulse control is fair.    The patient is okay to go home and get help on an outpatient basis.  IMPRESSION:   1.  Post-traumatic stress disorder, chronic with depression.  2.  History of bipolar disorder. 3.  Borderline personality disorder.    RECOMMENDATIONS:  Discontinue involuntary commitment.  Discharge the patient  home as she is on Prozac and Klonopin, and which she is going to get them filled tomorrow, 07/02/2014 and we will keep a follow up appointment with [facility].     ____________________________ Jannet MantisSurya K. Guss Bundehalla, MD skc:DT D: 07/01/2014 12:45:51 ET T: 07/01/2014 15:00:06 ET JOB#: [id number]  cc: Monika SalkSurya K. Guss Bundehalla, MD, <Dictator> Beau FannySURYA K Stevie Ertle MD ELECTRONICALLY SIGNED 07/20/2014 14:20

## 2015-02-05 ENCOUNTER — Emergency Department
Admission: EM | Admit: 2015-02-05 | Discharge: 2015-02-05 | Disposition: A | Discharge status: Home / Self Care | Payer: Medicaid Other

## 2015-02-05 NOTE — ED Notes (Signed)
RUQ pain onset 2 days ago, states it feels like previous gallbladder issue

## 2015-02-06 ENCOUNTER — Emergency Department
Admission: EM | Admit: 2015-02-06 | Discharge: 2015-02-06 | Disposition: A | Discharge status: Home / Self Care | Payer: Medicaid Other | Attending: Emergency Medicine | Admitting: Emergency Medicine

## 2015-02-06 ENCOUNTER — Encounter: Payer: Self-pay | Admitting: Emergency Medicine

## 2015-02-06 ENCOUNTER — Emergency Department: Payer: Medicaid Other

## 2015-02-06 DIAGNOSIS — K2100 Gastro-esophageal reflux disease with esophagitis, without bleeding: Secondary | ICD-10-CM

## 2015-02-06 DIAGNOSIS — Z3202 Encounter for pregnancy test, result negative: Secondary | ICD-10-CM | POA: Insufficient documentation

## 2015-02-06 DIAGNOSIS — N8320 Unspecified ovarian cysts: Secondary | ICD-10-CM | POA: Diagnosis not present

## 2015-02-06 DIAGNOSIS — E119 Type 2 diabetes mellitus without complications: Secondary | ICD-10-CM | POA: Insufficient documentation

## 2015-02-06 DIAGNOSIS — R1011 Right upper quadrant pain: Secondary | ICD-10-CM | POA: Diagnosis present

## 2015-02-06 DIAGNOSIS — Z91040 Latex allergy status: Secondary | ICD-10-CM | POA: Insufficient documentation

## 2015-02-06 DIAGNOSIS — Z79899 Other long term (current) drug therapy: Secondary | ICD-10-CM | POA: Diagnosis not present

## 2015-02-06 DIAGNOSIS — R52 Pain, unspecified: Secondary | ICD-10-CM

## 2015-02-06 DIAGNOSIS — Z720 Tobacco use: Secondary | ICD-10-CM | POA: Diagnosis not present

## 2015-02-06 LAB — CBC WITH DIFFERENTIAL/PLATELET
BASOS ABS: 0.1 10*3/uL (ref 0–0.1)
Basophils Relative: 1 %
Eosinophils Absolute: 0.5 10*3/uL (ref 0–0.7)
HCT: 40.4 % (ref 35.0–47.0)
Hemoglobin: 13 g/dL (ref 12.0–16.0)
LYMPHS ABS: 3.1 10*3/uL (ref 1.0–3.6)
MCH: 24.3 pg — ABNORMAL LOW (ref 26.0–34.0)
MCHC: 32.1 g/dL (ref 32.0–36.0)
MCV: 75.5 fL — ABNORMAL LOW (ref 80.0–100.0)
Monocytes Absolute: 0.7 10*3/uL (ref 0.2–0.9)
Monocytes Relative: 6 %
NEUTROS ABS: 7.5 10*3/uL — AB (ref 1.4–6.5)
PLATELETS: 355 10*3/uL (ref 150–440)
RBC: 5.35 MIL/uL — AB (ref 3.80–5.20)
RDW: 17 % — AB (ref 11.5–14.5)
WBC: 11.9 10*3/uL — AB (ref 3.6–11.0)

## 2015-02-06 LAB — URINALYSIS COMPLETE WITH MICROSCOPIC (ARMC ONLY)
BACTERIA UA: NONE SEEN
BILIRUBIN URINE: NEGATIVE
GLUCOSE, UA: NEGATIVE mg/dL
Hgb urine dipstick: NEGATIVE
Ketones, ur: NEGATIVE mg/dL
Nitrite: NEGATIVE
PH: 5 (ref 5.0–8.0)
Protein, ur: NEGATIVE mg/dL
Specific Gravity, Urine: 1.029 (ref 1.005–1.030)

## 2015-02-06 LAB — COMPREHENSIVE METABOLIC PANEL
ALT: 19 U/L (ref 14–54)
AST: 19 U/L (ref 15–41)
Albumin: 3.7 g/dL (ref 3.5–5.0)
Alkaline Phosphatase: 51 U/L (ref 38–126)
Anion gap: 7 (ref 5–15)
BUN: 12 mg/dL (ref 6–20)
CO2: 23 mmol/L (ref 22–32)
CREATININE: 0.76 mg/dL (ref 0.44–1.00)
Calcium: 8.6 mg/dL — ABNORMAL LOW (ref 8.9–10.3)
Chloride: 107 mmol/L (ref 101–111)
GLUCOSE: 99 mg/dL (ref 65–99)
Potassium: 4 mmol/L (ref 3.5–5.1)
Sodium: 137 mmol/L (ref 135–145)
Total Bilirubin: 0.1 mg/dL — ABNORMAL LOW (ref 0.3–1.2)
Total Protein: 7.1 g/dL (ref 6.5–8.1)

## 2015-02-06 LAB — LIPASE, BLOOD: LIPASE: 37 U/L (ref 22–51)

## 2015-02-06 LAB — PREGNANCY, URINE: Preg Test, Ur: NEGATIVE

## 2015-02-06 MED ORDER — IBUPROFEN 600 MG PO TABS
600.0000 mg | ORAL_TABLET | Freq: Once | ORAL | Status: AC
  Administered 2015-02-06: 600 mg via ORAL

## 2015-02-06 MED ORDER — IBUPROFEN 600 MG PO TABS
ORAL_TABLET | ORAL | Status: AC
  Filled 2015-02-06: qty 1

## 2015-02-06 MED ORDER — IBUPROFEN 800 MG PO TABS
ORAL_TABLET | ORAL | Status: AC
  Filled 2015-02-06: qty 1

## 2015-02-06 MED ORDER — GI COCKTAIL ~~LOC~~
30.0000 mL | Freq: Once | ORAL | Status: AC
  Administered 2015-02-06: 30 mL via ORAL

## 2015-02-06 MED ORDER — GI COCKTAIL ~~LOC~~
ORAL | Status: AC
  Filled 2015-02-06: qty 30

## 2015-02-06 NOTE — ED Notes (Signed)
MD at bedside. 

## 2015-02-06 NOTE — Discharge Instructions (Signed)
Abdominal Pain °Many things can cause abdominal pain. Usually, abdominal pain is not caused by a disease and will improve without treatment. It can often be observed and treated at home. Your health care provider will do a physical exam and possibly order blood tests and X-rays to help determine the seriousness of your pain. However, in many cases, more time must pass before a clear cause of the pain can be found. Before that point, your health care provider may not know if you need more testing or further treatment. °HOME CARE INSTRUCTIONS  °Monitor your abdominal pain for any changes. The following actions may help to alleviate any discomfort you are experiencing: °· Only take over-the-counter or prescription medicines as directed by your health care provider. °· Do not take laxatives unless directed to do so by your health care provider. °· Try a clear liquid diet (broth, tea, or water) as directed by your health care provider. Slowly move to a bland diet as tolerated. °SEEK MEDICAL CARE IF: °· You have unexplained abdominal pain. °· You have abdominal pain associated with nausea or diarrhea. °· You have pain when you urinate or have a bowel movement. °· You experience abdominal pain that wakes you in the night. °· You have abdominal pain that is worsened or improved by eating food. °· You have abdominal pain that is worsened with eating fatty foods. °· You have a fever. °SEEK IMMEDIATE MEDICAL CARE IF:  °· Your pain does not go away within 2 hours. °· You keep throwing up (vomiting). °· Your pain is felt only in portions of the abdomen, such as the right side or the left lower portion of the abdomen. °· You pass bloody or black tarry stools. °MAKE SURE YOU: °· Understand these instructions.   °· Will watch your condition.   °· Will get help right away if you are not doing well or get worse.   °Document Released: 07/01/2005 Document Revised: 09/26/2013 Document Reviewed: 05/31/2013 °[employer]® Patient Information  ©2015 [employer]. This information is not intended to replace advice given to you by your health care provider. Make sure you discuss any questions you have with your health care provider. ° °Gastroesophageal Reflux Disease, Adult °Gastroesophageal reflux disease (GERD) happens when acid from your stomach goes into your food pipe (esophagus). The acid can cause a burning feeling in your chest. Over time, the acid can make small holes (ulcers) in your food pipe.  °HOME CARE °· Ask your doctor for advice about: °¨ Losing weight. °¨ Quitting smoking. °¨ Alcohol use. °· Avoid foods and drinks that make your problems worse. You may want to avoid: °¨ Caffeine and alcohol. °¨ Chocolate. °¨ Mints. °¨ Garlic and onions. °¨ Spicy foods. °¨ Citrus fruits, such as oranges, lemons, or limes. °¨ Foods that contain tomato, such as sauce, chili, salsa, and pizza. °¨ Fried and fatty foods. °· Avoid lying down for 3 hours before you go to bed or before you take a nap. °· Eat small meals often, instead of large meals. °· Wear loose-fitting clothing. Do not wear anything tight around your waist. °· Raise (elevate) the head of your bed 6 to 8 inches with wood blocks. Using extra pillows does not help. °· Only take medicines as told by your doctor. °· Do not take aspirin or ibuprofen. °GET HELP RIGHT AWAY IF:  °· You have pain in your arms, neck, jaw, teeth, or back. °· Your pain gets worse or changes. °· You feel sick to your stomach (nauseous), throw up (  vomit), or sweat (diaphoresis). °· You feel short of breath, or you pass out (faint). °· Your throw up is green, yellow, black, or looks like coffee grounds or blood. °· Your poop (stool) is red, bloody, or black. °MAKE SURE YOU:  °· Understand these instructions. °· Will watch your condition. °· Will get help right away if you are not doing well or get worse. °Document Released: 03/09/2008 Document Revised: 12/14/2011 Document Reviewed: 04/10/2011 °Exit[employer]® Patient Information  ©2015 [employer]. This information is not intended to replace advice given to you by your health care provider. Make sure you discuss any questions you have with your health care provider. ° °

## 2015-02-06 NOTE — ED Provider Notes (Signed)
[facility] Emergency Department Provider Note    ____________________________________________  Time seen: 7:50 AM  I have reviewed the triage vital signs and the nursing notes.   HISTORY  Chief Complaint Abdominal Pain       HPI Faith [name] is a 24 y.o. female who is presenting with right upper quadrant pain for several days and waxes and wanes in severity from mild to moderate. She has been diagnosed with gallstones in the past several years ago was recommended for surgery but has since then moved from [place] to [place] and has not followed up with a Careers advisersurgeon here. Pain seems to be worse after she needs located in the right upper quadrant wraps around to the back and occasionally is associated with burping and belching or indigestion symptoms. Patient additionally notes that she is on Clomid per her OB/GYN right now however she has not had any pelvic pain or lower abdominal discomfort.     Past Medical History  Diagnosis Date  . Asthma   . Seizures   . Diabetes mellitus without complication   . Tubal pregnancy   . Heart murmur   . Ovarian cyst   . Gallstones   . Migraines     Patient Active Problem List   Diagnosis Date Noted  . Post traumatic stress disorder (PTSD) 06/16/2013  . Depression 06/15/2013  . Suicidal ideation 06/15/2013  . Status epilepticus 06/14/2013  . Diabetes mellitus type 2 in obese 06/14/2013    Past Surgical History  Procedure Laterality Date  . Dilation and curettage of uterus    . Ovarian cyst removed    . Tonsillectomy    . Tonsillectomy and adenoidectomy      Current Outpatient Rx  Name  Route  Sig  Dispense  Refill  . albuterol (PROVENTIL HFA;VENTOLIN HFA) 108 (90 BASE) MCG/ACT inhaler   Inhalation   Inhale 2 puffs into the lungs every 6 (six) hours as needed. For shortness of breath         . carbamazepine (TEGRETOL) 200 MG tablet   Oral   Take 200 mg by mouth 3 (three) times daily.          . clonazePAM (KLONOPIN) 1 MG tablet   Oral   Take 1 tablet (1 mg total) by mouth 2 (two) times daily. For anxiety and panic.   60 tablet   0   . FLUoxetine (PROZAC) 20 MG capsule   Oral   Take 1 capsule (20 mg total) by mouth daily. For anxiety and depression.   30 capsule   0   . ibuprofen (ADVIL,MOTRIN) 200 MG tablet   Oral   Take 400 mg by mouth every 6 (six) hours as needed for pain.         Marland Kitchen. levETIRAcetam (KEPPRA) 1000 MG tablet   Oral   Take 1 tablet (1,000 mg total) by mouth 2 (two) times daily. For seizures.   60 tablet   0   . metFORMIN (GLUCOPHAGE) 500 MG tablet   Oral   Take 1 tablet (500 mg total) by mouth 2 (two) times daily with a meal. For type 2 diabetes.         . Vitamin D, Ergocalciferol, (DRISDOL) 50000 UNITS CAPS capsule   Oral   Take 1 capsule (50,000 Units total) by mouth every 7 (seven) days. Takes Wednesday for low vitamin D.   30 capsule        Allergies Morphine; Morphine and related; Sulfasalazine; Tomato;  Toradol; Latex; Sulfa antibiotics; Tramadol; Hydrocodone-acetaminophen; Iohexol; Lamictal; Sulfur; and Mushroom extract complex  History reviewed. No pertinent family history.  Social History History  Substance Use Topics  . Smoking status: Current Every Day Smoker -- 0.50 packs/day for 3 years    Types: Cigarettes  . Smokeless tobacco: Never Used  . Alcohol Use: No     Comment: occ    Review of Systems  Constitutional: Negative for fever. Eyes: Negative for visual changes. ENT: Negative for sore throat. Cardiovascular: Negative for chest pain. Respiratory: Negative for shortness of breath. Gastrointestinal: Negative for vomiting and diarrhea. Genitourinary: Negative for dysuria. Negative for pelvic pain and vaginal discharge or other GYN complaints Musculoskeletal: Negative for pain. Skin: Negative for rash. Neurological: Negative for headaches, focal weakness or numbness.   10-point ROS otherwise  negative.  ____________________________________________   PHYSICAL EXAM:  VITAL SIGNS: ED Triage Vitals  Enc Vitals Group     BP 02/06/15 0728 122/67 mmHg     Pulse Rate 02/06/15 0728 91     Resp 02/06/15 0728 18     Temp 02/06/15 0728 98.1 F (36.7 C)     Temp Source 02/06/15 0728 Oral     SpO2 02/06/15 0728 97 %     Weight 02/06/15 0739 185 lb (83.915 kg)     Height 02/06/15 0739 5' (1.524 m)     Head Cir --      Peak Flow --      Pain Score 02/06/15 0728 9     Pain Loc --      Pain Edu? --      Excl. in [place]? --      Constitutional: Alert and oriented. Well appearing and in no distress. Patient is obese Eyes: Conjunctivae are normal. PERRL. Normal extraocular movements. ENT   Head: Normocephalic and atraumatic.   Nose: No congestion/rhinnorhea.   Mouth/Throat: Mucous membranes are moist.   Neck: No stridor. Hematological/Lymphatic/Immunilogical: No ecchymosis or bruising visible Cardiovascular: Normal rate, regular rhythm. Normal and symmetric distal pulses are present in all extremities. No murmurs, rubs, or gallops. Respiratory: Normal respiratory effort without tachypnea nor retractions. Breath sounds are clear and equal bilaterally. No wheezes/rales/rhonchi. Gastrointestinal: Mild to moderate right upper quadrant and epigastric pain. No lower abdominal tenderness no right lower quadrant tenderness No distention.  Genitourinary: Deferred Musculoskeletal: Nontender with normal range of motion in all extremities. No joint effusions.  No lower extremity tenderness nor edema. Neurologic:  Normal speech and language. No gross focal neurologic deficits are appreciated. Speech is normal. No gait instability. Skin:  Skin is warm, dry and intact. No rash noted. Psychiatric: Mood and affect are normal. Speech and behavior are normal. Patient exhibits appropriate insight and judgment.  ____________________________________________    LABS (pertinent  positives/negatives)  Urine pregnancy test negative. Urinalysis negative. Metabolic panel unremarkable. WBC 11.9.  ____________________________________________   EKG    ____________________________________________    RADIOLOGY  Right upper quadrant ultrasound negative. Transvaginal ultrasound showing Right-sided ovarian cyst.   ____________________________________________   PROCEDURES  Procedure(s) performed: None  Critical Care performed: No  ____________________________________________   INITIAL IMPRESSION / ASSESSMENT AND PLAN / ED COURSE  Pertinent labs & imaging results that were available during my care of the patient were reviewed by me and considered in my medical decision making (see chart for details).  Based on her presentation I am suspicious of gallbladder etiology of her pain. I do not think there is pelvic in etiology and we've chosen to defer pelvic exam at  this point in time.  Her ultrasound right upper quadrant showed no gallstones and contracted gallbladder. Her laboratory evaluation is unremarkable. Given the fact that she is on Clomid we decided to check a transvaginal ultrasound there is no sign of ovarian hyperstimulation however there is a right-sided ovarian cyst. She has a follow-up with her OB/GYN. Her symptoms actually seem more to do with GERD. She experienced some improvement with a GI cocktail. We discussed over-the-counter Prilosec for 1-2 weeks and a GERD diet. Return precautions and follow-up discussed with patient.  ____________________________________________   FINAL CLINICAL IMPRESSION(S) / ED DIAGNOSES  Final diagnoses:  RUQ pain  Pain  Gastroesophageal reflux disease with esophagitis     Governor Rooks, MD 02/06/15 1221

## 2015-06-13 ENCOUNTER — Emergency Department
Admission: EM | Admit: 2015-06-13 | Discharge: 2015-06-13 | Disposition: A | Discharge status: Home / Self Care | Payer: Medicaid Other

## 2015-06-18 ENCOUNTER — Emergency Department
Admission: EM | Admit: 2015-06-18 | Discharge: 2015-06-18 | Disposition: A | Discharge status: Home / Self Care | Payer: Medicaid Other | Attending: Emergency Medicine | Admitting: Emergency Medicine

## 2015-06-18 ENCOUNTER — Encounter: Payer: Self-pay | Admitting: Emergency Medicine

## 2015-06-18 ENCOUNTER — Emergency Department: Payer: Medicaid Other

## 2015-06-18 DIAGNOSIS — R1110 Vomiting, unspecified: Secondary | ICD-10-CM | POA: Insufficient documentation

## 2015-06-18 DIAGNOSIS — E119 Type 2 diabetes mellitus without complications: Secondary | ICD-10-CM | POA: Insufficient documentation

## 2015-06-18 DIAGNOSIS — Z79899 Other long term (current) drug therapy: Secondary | ICD-10-CM | POA: Insufficient documentation

## 2015-06-18 DIAGNOSIS — Z720 Tobacco use: Secondary | ICD-10-CM | POA: Insufficient documentation

## 2015-06-18 DIAGNOSIS — R1011 Right upper quadrant pain: Secondary | ICD-10-CM | POA: Insufficient documentation

## 2015-06-18 DIAGNOSIS — E669 Obesity, unspecified: Secondary | ICD-10-CM | POA: Insufficient documentation

## 2015-06-18 DIAGNOSIS — Z3202 Encounter for pregnancy test, result negative: Secondary | ICD-10-CM | POA: Insufficient documentation

## 2015-06-18 DIAGNOSIS — Z91040 Latex allergy status: Secondary | ICD-10-CM | POA: Diagnosis not present

## 2015-06-18 LAB — CBC WITH DIFFERENTIAL/PLATELET
BASOS ABS: 0.1 10*3/uL (ref 0–0.1)
BASOS PCT: 0 %
EOS ABS: 0.3 10*3/uL (ref 0–0.7)
Eosinophils Relative: 3 %
HCT: 40.3 % (ref 35.0–47.0)
HEMOGLOBIN: 12.8 g/dL (ref 12.0–16.0)
Lymphocytes Relative: 22 %
Lymphs Abs: 2.7 10*3/uL (ref 1.0–3.6)
MCH: 24 pg — ABNORMAL LOW (ref 26.0–34.0)
MCHC: 31.7 g/dL — ABNORMAL LOW (ref 32.0–36.0)
MCV: 75.8 fL — ABNORMAL LOW (ref 80.0–100.0)
Monocytes Absolute: 0.7 10*3/uL (ref 0.2–0.9)
Monocytes Relative: 6 %
NEUTROS PCT: 69 %
Neutro Abs: 8.7 10*3/uL — ABNORMAL HIGH (ref 1.4–6.5)
Platelets: 375 10*3/uL (ref 150–440)
RBC: 5.32 MIL/uL — AB (ref 3.80–5.20)
RDW: 15.8 % — ABNORMAL HIGH (ref 11.5–14.5)
WBC: 12.6 10*3/uL — AB (ref 3.6–11.0)

## 2015-06-18 LAB — URINALYSIS COMPLETE WITH MICROSCOPIC (ARMC ONLY)
Bilirubin Urine: NEGATIVE
Glucose, UA: NEGATIVE mg/dL
Hgb urine dipstick: NEGATIVE
Ketones, ur: NEGATIVE mg/dL
Nitrite: NEGATIVE
PH: 7 (ref 5.0–8.0)
PROTEIN: NEGATIVE mg/dL
Specific Gravity, Urine: 1.019 (ref 1.005–1.030)

## 2015-06-18 LAB — COMPREHENSIVE METABOLIC PANEL
ALT: 18 U/L (ref 14–54)
ANION GAP: 9 (ref 5–15)
AST: 21 U/L (ref 15–41)
Albumin: 3.7 g/dL (ref 3.5–5.0)
Alkaline Phosphatase: 72 U/L (ref 38–126)
BUN: 8 mg/dL (ref 6–20)
CO2: 26 mmol/L (ref 22–32)
Calcium: 9 mg/dL (ref 8.9–10.3)
Chloride: 100 mmol/L — ABNORMAL LOW (ref 101–111)
Creatinine, Ser: 0.68 mg/dL (ref 0.44–1.00)
GFR calc Af Amer: >60 mL/min (ref >60)
GFR calc non Af Amer: >60 mL/min (ref >60)
GLUCOSE: 77 mg/dL (ref 65–99)
Potassium: 4.4 mmol/L (ref 3.5–5.1)
SODIUM: 135 mmol/L (ref 135–145)
Total Bilirubin: 0.6 mg/dL (ref 0.3–1.2)
Total Protein: 7.5 g/dL (ref 6.5–8.1)

## 2015-06-18 LAB — POCT PREGNANCY, URINE: Preg Test, Ur: NEGATIVE

## 2015-06-18 LAB — GLUCOSE, CAPILLARY: GLUCOSE-CAPILLARY: 84 mg/dL (ref 65–99)

## 2015-06-18 LAB — LIPASE, BLOOD: Lipase: 16 U/L — ABNORMAL LOW (ref 22–51)

## 2015-06-18 MED ORDER — ONDANSETRON HCL 4 MG PO TABS: 4.0000 mg | ORAL_TABLET | Freq: Every day | ORAL | Status: DC | PRN

## 2015-06-18 MED ORDER — HYDROMORPHONE HCL 1 MG/ML IJ SOLN: 0.5000 mg | INTRAMUSCULAR | Status: DC | PRN

## 2015-06-18 MED ORDER — HYDROMORPHONE HCL 1 MG/ML IJ SOLN
INTRAMUSCULAR | Status: AC
  Filled 2015-06-18: qty 1

## 2015-06-18 MED ORDER — RANITIDINE HCL 150 MG PO TABS: 150.0000 mg | ORAL_TABLET | Freq: Every day | ORAL | Status: DC

## 2015-06-18 MED ORDER — BLOOD GLUCOSE MONITOR KIT: PACK | Status: DC

## 2015-06-18 MED ORDER — ONDANSETRON 4 MG PO TBDP
ORAL_TABLET | ORAL | Status: AC
  Administered 2015-06-18: 4 mg via ORAL
  Filled 2015-06-18: qty 1

## 2015-06-18 MED ORDER — ONDANSETRON 4 MG PO TBDP
4.0000 mg | ORAL_TABLET | Freq: Once | ORAL | Status: AC
  Administered 2015-06-18: 4 mg via ORAL

## 2015-06-18 NOTE — ED Provider Notes (Signed)
[facility] Emergency Department Provider Note  ____________________________________________  Time seen: 7096  I have reviewed the triage vital signs and the nursing notes.   HISTORY  Chief Complaint Abdominal pain, right upper quadrant    HPI Faith [name] is a 24 y.o. female who presents to the emergency department with pain in her right upper quadrant. This is been occurring for 2 days. She has had nausea and vomiting. She has not had any diarrhea.  She reports she has had similar symptoms before. She is been evaluated in [place] and was told she had gallbladder problems and was scheduled for surgery. There is some conflict with surgery when it was scheduled and she was unable to reschedule and return for surgery then.  She has since moved to [place].    Past Medical History  Diagnosis Date  . Asthma   . Seizures   . Diabetes mellitus without complication   . Tubal pregnancy   . Heart murmur   . Ovarian cyst   . Gallstones   . Migraines     Patient Active Problem List   Diagnosis Date Noted  . Post traumatic stress disorder (PTSD) 06/16/2013  . Depression 06/15/2013  . Suicidal ideation 06/15/2013  . Status epilepticus 06/14/2013  . Diabetes mellitus type 2 in obese 06/14/2013    Past Surgical History  Procedure Laterality Date  . Dilation and curettage of uterus    . Ovarian cyst removed    . Tonsillectomy    . Tonsillectomy and adenoidectomy      Current Outpatient Rx  Name  Route  Sig  Dispense  Refill  . albuterol (PROVENTIL HFA;VENTOLIN HFA) 108 (90 BASE) MCG/ACT inhaler   Inhalation   Inhale 2 puffs into the lungs every 6 (six) hours as needed. For shortness of breath         . blood glucose meter kit and supplies KIT      Dispense based on patient and insurance preference. Use up to four times daily as directed. (FOR ICD-9 250.00, 250.01).   1 each   0   . carbamazepine (TEGRETOL) 200 MG tablet   Oral   Take  200 mg by mouth 3 (three) times daily.         . clomiPHENE (CLOMID) 50 MG tablet   Oral   Take 1 tablet by mouth daily.      0   . clonazePAM (KLONOPIN) 1 MG tablet   Oral   Take 1 tablet (1 mg total) by mouth 2 (two) times daily. For anxiety and panic.   60 tablet   0   . clonazePAM (KLONOPIN) 1 MG tablet   Oral   Take 1 tablet by mouth 2 (two) times daily.         Marland Kitchen FLUoxetine (PROZAC) 20 MG capsule   Oral   Take 1 capsule (20 mg total) by mouth daily. For anxiety and depression.   30 capsule   0   . FLUoxetine (PROZAC) 40 MG capsule   Oral   Take 1 capsule by mouth daily.         Marland Kitchen ibuprofen (ADVIL,MOTRIN) 200 MG tablet   Oral   Take 400 mg by mouth every 6 (six) hours as needed for pain.         . Lacosamide 100 MG TABS   Oral   Take 1 tablet by mouth 2 (two) times daily.         Marland Kitchen levETIRAcetam (KEPPRA)  1000 MG tablet   Oral   Take 1 tablet (1,000 mg total) by mouth 2 (two) times daily. For seizures.   60 tablet   0   . metFORMIN (GLUCOPHAGE) 500 MG tablet   Oral   Take 1 tablet (500 mg total) by mouth 2 (two) times daily with a meal. For type 2 diabetes.         . metFORMIN (GLUCOPHAGE) 500 MG tablet   Oral   Take 1 tablet by mouth daily.         . ondansetron (ZOFRAN) 4 MG tablet   Oral   Take 1 tablet (4 mg total) by mouth daily as needed for nausea or vomiting.   10 tablet   0   . ranitidine (ZANTAC) 150 MG tablet   Oral   Take 1 tablet (150 mg total) by mouth at bedtime.   30 tablet   1   . Vitamin D, Ergocalciferol, (DRISDOL) 50000 UNITS CAPS capsule   Oral   Take 1 capsule (50,000 Units total) by mouth every 7 (seven) days. Takes Wednesday for low vitamin D.   30 capsule        Allergies Morphine; Morphine and related; Sulfasalazine; Tomato; Toradol; Latex; Sulfa antibiotics; Tramadol; Hydrocodone-acetaminophen; Iohexol; Lamictal; Sulfur; and Mushroom extract complex  No family history on file.  Social  History Social History  Substance Use Topics  . Smoking status: Current Every Day Smoker -- 0.50 packs/day for 3 years    Types: Cigarettes  . Smokeless tobacco: Never Used  . Alcohol Use: No     Comment: occ    Review of Systems  Constitutional: Negative for fever. ENT: Negative for sore throat. Cardiovascular: Negative for chest pain. Respiratory: Negative for shortness of breath. Gastrointestinal: Positive for abdominal pain, right upper quadrant, with vomiting. See history of present illness. Genitourinary: Negative for dysuria. Musculoskeletal: No myalgias or injuries. Skin: Negative for rash. Neurological: Negative for headaches   10-point ROS otherwise negative.  ____________________________________________   PHYSICAL EXAM:  VITAL SIGNS: ED Triage Vitals  Enc Vitals Group     BP 06/18/15 1040 90/58 mmHg     Pulse Rate 06/18/15 1040 85     Resp 06/18/15 1040 18     Temp 06/18/15 1040 98.4 F (36.9 C)     Temp Source 06/18/15 1040 Oral     SpO2 06/18/15 1040 98 %     Weight 06/18/15 1040 185 lb (83.915 kg)     Height 06/18/15 1040 5' (1.524 m)     Head Cir --      Peak Flow --      Pain Score 06/18/15 1040 10     Pain Loc --      Pain Edu? --      Excl. in [place]? --     Constitutional:  Alert and oriented. Large body habitus. Well appearing and in no distress. ENT   Head: Normocephalic and atraumatic. Cardiovascular: Normal rate, regular rhythm, no murmur noted Respiratory:  Normal respiratory effort, no tachypnea.    Breath sounds are clear and equal bilaterally.  Gastrointestinal: Soft, focal tenderness in the right upper quadrant.  Back: No muscle spasm, no tenderness, no CVA tenderness. Musculoskeletal: No deformity noted. Nontender with normal range of motion in all extremities.  No noted edema. Neurologic:  Normal speech and language. No gross focal neurologic deficits are appreciated.  Skin:  Skin is warm, dry. No rash noted. Psychiatric: Mood  and affect are normal. Speech and behavior  are normal.  ____________________________________________    LABS (pertinent positives/negatives)  Labs Reviewed  LIPASE, BLOOD - Abnormal; Notable for the following:    Lipase 16 (*)    All other components within normal limits  COMPREHENSIVE METABOLIC PANEL - Abnormal; Notable for the following:    Chloride 100 (*)    All other components within normal limits  URINALYSIS COMPLETEWITH MICROSCOPIC ([facility] ONLY) - Abnormal; Notable for the following:    Color, Urine YELLOW (*)    APPearance CLOUDY (*)    Leukocytes, UA 2+ (*)    Bacteria, UA RARE (*)    Squamous Epithelial / LPF 6-30 (*)    All other components within normal limits  CBC WITH DIFFERENTIAL/PLATELET - Abnormal; Notable for the following:    WBC 12.6 (*)    RBC 5.32 (*)    MCV 75.8 (*)    MCH 24.0 (*)    MCHC 31.7 (*)    RDW 15.8 (*)    Neutro Abs 8.7 (*)    All other components within normal limits  POCT PREGNANCY, URINE     ____________________________________________  RADIOLOGY  Ultrasound right upper quadrant: FINDINGS: Gallbladder:  No gallstones or wall thickening visualized. No sonographic Murphy sign noted.  Common bile duct:  Diameter: 3 mm  Liver:  The liver is difficult to penetrate sonographically. Evaluation is limited and focal hepatic abnormalities cannot be excluded.  IMPRESSION: Cannot exclude the possibility of focal hepatic abnormalities given limited sonographic evaluation. This may reflect fatty infiltration. Gallbladder is normal with no evidence biliary dilatation.  ____________________________________________ ____________________________________________   INITIAL IMPRESSION / ASSESSMENT AND PLAN / ED COURSE  Pertinent labs & imaging results that were available during my care of the patient were reviewed by me and considered in my medical decision making (see chart for details).  24 year old female with reported  gallbladder problems previously, now 2 days of abdominal pain. We'll obtain an ultrasound of the quadrant.  ----------------------------------------- 4:43 PM on 06/18/2015 -----------------------------------------  The patient does not show any biliary disease. She has no gallstones, thickening, or signs of ductal dilatation.  With a long history of gallbladder issues, by her report, and current pain, she may need a HIDA scan. We will refer her to general surgery for further evaluation of this abdominal pain.  I will prescribe antinausea medicine for her.  ____________________________________________   FINAL CLINICAL IMPRESSION(S) / ED DIAGNOSES  Final diagnoses:  Abdominal pain, RUQ      Ahmed Prima, MD 06/18/15 1652

## 2015-06-18 NOTE — ED Notes (Signed)
Patient still in [place] at this time.

## 2015-06-18 NOTE — Discharge Instructions (Signed)
Your ultrasound did not show any gallstones, inflammation, or blockage. You may need additional testing, including a "HIDA scan" that checks for gallbladder function. The ultrasound did not get a very good view of your liver. Your liver function tests were good. Use Zofran if needed for nausea. Follow-up with Dr. Michela Pitcher and associates. Return to the emergency department if you have worsening pain, uncontrolled nausea, or other urgent concerns.  Abdominal Pain Many things can cause belly (abdominal) pain. Most times, the belly pain is not dangerous. Many cases of belly pain can be watched and treated at home. HOME CARE   Do not take medicines that help you go poop (laxatives) unless told to by your doctor.  Only take medicine as told by your doctor.  Eat or drink as told by your doctor. Your doctor will tell you if you should be on a special diet. GET HELP IF:  You do not know what is causing your belly pain.  You have belly pain while you are sick to your stomach (nauseous) or have runny poop (diarrhea).  You have pain while you pee or poop.  Your belly pain wakes you up at night.  You have belly pain that gets worse or better when you eat.  You have belly pain that gets worse when you eat fatty foods.  You have a fever. GET HELP RIGHT AWAY IF:   The pain does not go away within 2 hours.  You keep throwing up (vomiting).  The pain changes and is only in the right or left part of the belly.  You have bloody or tarry looking poop. MAKE SURE YOU:   Understand these instructions.  Will watch your condition.  Will get help right away if you are not doing well or get worse. Document Released: 03/09/2008 Document Revised: 09/26/2013 Document Reviewed: 05/31/2013 [facility] Patient Information 2015 [place], [place]. This information is not intended to replace advice given to you by your health care provider. Make sure you discuss any questions you have with your health care  provider.

## 2015-06-18 NOTE — ED Notes (Signed)
Pt presents with RUQ pain for a couple of day with n/v. Was told by urgent care that she needs an ultrasound to check her gallbladder.

## 2015-06-24 ENCOUNTER — Encounter: Payer: Self-pay | Admitting: Unknown Physician Specialty

## 2015-07-17 ENCOUNTER — Ambulatory Visit (INDEPENDENT_AMBULATORY_CARE_PROVIDER_SITE_OTHER): Payer: Medicaid Other | Admitting: Surgery

## 2015-07-17 ENCOUNTER — Encounter: Payer: Self-pay | Admitting: Surgery

## 2015-07-17 VITALS — BP 129/80 | HR 98 | Temp 98.1°F | Ht 60.0 in | Wt 241.0 lb

## 2015-07-17 DIAGNOSIS — R1010 Upper abdominal pain, unspecified: Secondary | ICD-10-CM | POA: Diagnosis not present

## 2015-07-17 DIAGNOSIS — F319 Bipolar disorder, unspecified: Secondary | ICD-10-CM | POA: Insufficient documentation

## 2015-07-17 DIAGNOSIS — F988 Other specified behavioral and emotional disorders with onset usually occurring in childhood and adolescence: Secondary | ICD-10-CM | POA: Insufficient documentation

## 2015-07-17 DIAGNOSIS — G8929 Other chronic pain: Secondary | ICD-10-CM

## 2015-07-17 DIAGNOSIS — R1011 Right upper quadrant pain: Principal | ICD-10-CM

## 2015-07-17 NOTE — Progress Notes (Signed)
CC: Chronic RUQ pain, nausea/vomiting  HPI: Faith [name] is a pleasant 24 yo F who presents with 2 years of recurrent RUQ pain.  She says that it is worse with fatty foods, associated with nausea and vomiting.  Was seen in [place] and had gallbladder surgery scheduled then cancelled.  Otherwise no fevers/chills, night sweats, shortness of breath, cough, chest pain, diarrhea/constipation, dysuria/hematuria.  Active Ambulatory Problems    Diagnosis Date Noted  . Status epilepticus ([place]) 06/14/2013  . Diabetes mellitus type 2 in obese ([place]) 06/14/2013  . Depression 06/15/2013  . Suicidal ideation 06/15/2013  . Post traumatic stress disorder (PTSD) 06/16/2013  . ADD (attention deficit disorder) 07/17/2015  . Affective bipolar disorder ([place]) 07/17/2015  . Body mass index of 60 or higher ([place]) 01/20/2015   Resolved Ambulatory Problems    Diagnosis Date Noted  . No Resolved Ambulatory Problems   Past Medical History  Diagnosis Date  . Asthma   . Seizures ([place])   . Diabetes mellitus without complication ([place])   . Tubal pregnancy   . Heart murmur   . Ovarian cyst   . Gallstones   . Migraines    Past Surgical History  Procedure Laterality Date  . Dilation and curettage of uterus    . Ovarian cyst removed    . Tonsillectomy    . Tonsillectomy and adenoidectomy       Medication List       This list is accurate as of: 07/17/15  3:32 PM.  Always use your most recent med list.               albuterol 108 (90 BASE) MCG/ACT inhaler  Commonly known as:  PROVENTIL HFA;VENTOLIN HFA  Inhale 2 puffs into the lungs every 6 (six) hours as needed. For shortness of breath     blood glucose meter kit and supplies Kit  Dispense based on patient and insurance preference. Use up to four times daily as directed. (FOR ICD-9 250.00, 250.01).     clomiPHENE 50 MG tablet  Commonly known as:  CLOMID  Take 1 tablet by mouth daily.     clonazePAM 1 MG tablet  Commonly known as:  KLONOPIN  Take 1  tablet (1 mg total) by mouth 2 (two) times daily. For anxiety and panic.     FLUoxetine 20 MG capsule  Commonly known as:  PROZAC  Take 1 capsule (20 mg total) by mouth daily. For anxiety and depression.     ibuprofen 200 MG tablet  Commonly known as:  ADVIL,MOTRIN  Take 400 mg by mouth every 6 (six) hours as needed for pain.     Lacosamide 100 MG Tabs  Take 1 tablet by mouth 2 (two) times daily.     metFORMIN 500 MG tablet  Commonly known as:  GLUCOPHAGE  Take 1 tablet by mouth daily.     ondansetron 4 MG tablet  Commonly known as:  ZOFRAN  Take 1 tablet (4 mg total) by mouth daily as needed for nausea or vomiting.     PRENATAL VITAMINS PO  Take 1 tablet by mouth 1 day or 1 dose.     ranitidine 150 MG tablet  Commonly known as:  ZANTAC  Take 1 tablet (150 mg total) by mouth at bedtime.       Allergies  Allergen Reactions  . Morphine Other (See Comments)    bradycradia  . Morphine And Related Other (See Comments)    bradycardia  . Sulfasalazine Hives  .  Tomato Anaphylaxis  . Toradol [Ketorolac Tromethamine] Anaphylaxis, Hives and Swelling  . Dilaudid [Hydromorphone Hcl] Hives  . Latex Rash  . Sulfa Antibiotics Hives  . Tramadol Swelling, Rash and Hives  . Hydrocodone-Acetaminophen Other (See Comments)  . Iohexol     Pt. States she can not take iohexol but won't state reaction type. Pt. States she can not take iohexol but won't state reaction type.  . Lamictal [Lamotrigine]   . Sulfur   . Mushroom Extract Complex Rash     Social History   Social History  . Marital Status: Single    Spouse Name: N/A  . Number of Children: N/A  . Years of Education: N/A   Occupational History  . Not on file.   Social History Main Topics  . Smoking status: Current Every Day Smoker -- 0.50 packs/day for 3 years    Types: Cigarettes  . Smokeless tobacco: Never Used  . Alcohol Use: No  . Drug Use: No  . Sexual Activity: Yes    Birth Control/ Protection: None   Other  Topics Concern  . Not on file   Social History Narrative   Family History  Problem Relation Age of Onset  . Heart disease Father   . Huntington's disease Mother    ROS: Full ROS obtained, pertinent positives and negatives as above  Blood pressure 129/80, pulse 98, temperature 98.1 F (36.7 C), temperature source Oral, height 5' (1.524 m), weight 241 lb (109.317 kg), last menstrual period 05/24/2015. GEN: NAD/A&Ox3 FACE: no obvious facial trauma, normal external nose, normal external ears EYES: no scleral icterus, no conjunctivitis HEAD: normocephalic atraumatic CV: RRR, no MRG RESP: moving air well, lungs clear ABD: soft, tender to palp RUQ, nondistended EXT: moving all ext well, strength 5/5 NEURO: cnII-XII grossly intact, sensation intact all 4 ext  Labs: personally reviewed, significant for  LFT - nl WBC 12.6  U/S reviewed: No gallstones  A/P 24 yo with chronic RUQ pain, N/V.  Although the pain and its intensifying factors do suggest biliary tract disease, the patient is without gallstones.  Will recommend GI workup for other sources, if negative will obtain HIDA.  I have explained that I do not think that cholecystectomy would fix her symptoms based on the information that we have now, but if GI workup is negative and HIDA shows low EF, then there is still a limited chance of success with cholecysetctomy.  Will f/u post GI consult.

## 2015-07-17 NOTE — Patient Instructions (Signed)
We will refer you to our Gastroenterologist to evaluate you in reference to your abdominal pain. After you see Dr. Servando SnareWohl, we will decide if you need surgery or not.

## 2015-08-22 ENCOUNTER — Ambulatory Visit: Payer: Medicaid Other | Admitting: Gastroenterology

## 2015-10-10 ENCOUNTER — Ambulatory Visit: Payer: Medicaid Other | Admitting: Gastroenterology

## 2015-12-11 LAB — OB RESULTS CONSOLE HEPATITIS B SURFACE ANTIGEN: Hepatitis B Surface Ag: NEGATIVE

## 2015-12-11 LAB — OB RESULTS CONSOLE HIV ANTIBODY (ROUTINE TESTING): HIV: NONREACTIVE

## 2015-12-11 LAB — OB RESULTS CONSOLE GC/CHLAMYDIA
CHLAMYDIA, DNA PROBE: NEGATIVE
Gonorrhea: NEGATIVE

## 2015-12-11 LAB — OB RESULTS CONSOLE RPR: RPR: NONREACTIVE

## 2015-12-11 LAB — OB RESULTS CONSOLE VARICELLA ZOSTER ANTIBODY, IGG: VARICELLA IGG: IMMUNE

## 2016-01-02 ENCOUNTER — Ambulatory Visit: Payer: Medicaid Other

## 2016-01-06 ENCOUNTER — Ambulatory Visit
Admission: RE | Admit: 2016-01-06 | Discharge: 2016-01-06 | Disposition: A | Discharge status: Home / Self Care | Payer: Medicaid Other | Source: Ambulatory Visit | Attending: Obstetrics & Gynecology | Admitting: Obstetrics & Gynecology

## 2016-01-06 VITALS — BP 114/71 | HR 106 | Temp 98.6°F | Wt 262.0 lb

## 2016-01-06 DIAGNOSIS — E1169 Type 2 diabetes mellitus with other specified complication: Secondary | ICD-10-CM

## 2016-01-06 DIAGNOSIS — E119 Type 2 diabetes mellitus without complications: Secondary | ICD-10-CM | POA: Diagnosis not present

## 2016-01-06 DIAGNOSIS — E669 Obesity, unspecified: Secondary | ICD-10-CM

## 2016-01-06 NOTE — Consult Note (Signed)
[facility] Consultation   Chief Complaint: pregestational diabetes type 2,  BMI 47, seizure disorder, asthma, anxiety/depression  HPI: [name] is a 25 y.o. G1P0 at [redacted]w[redacted]d by LMP who presents in consultation from [facility] for pregestational diabetes type 2, BMI 47, seizure disorder, asthma, anxiety/depression  Past Medical History: Patient  has a past medical history of Asthma; Seizures (HCC); Diabetes mellitus without complication (HCC); Tubal pregnancy; Heart murmur; Ovarian cyst; Gallstones; and Migraines.   Past Surgical History: She  has past surgical history that includes Dilation and curettage of uterus; ovarian cyst removed; Tonsillectomy; and Tonsillectomy and adenoidectomy.   Obstetric History: G3 P0020 G1 - ectopic  G2 - ~8 week SAB, D&C  Gynecologic History:  LMP 09/13/2015  Medications: Metformin, Folic Acid, PNV, Diclegis  Allergies: Patient is allergic to morphine; morphine and related; sulfasalazine; tomato; toradol; dilaudid; latex; sulfa antibiotics; tramadol; hydrocodone-acetaminophen; iohexol; lamictal; sulfur; and mushroom extract complex.   Social History: Here with her "sister-in-law"  Lives with fiance.  On disability. Patient  reports that she has been smoking Cigarettes.  She has a 1.5 pack-year smoking history. She has never used smokeless tobacco. She reports that she does not drink alcohol or use illicit drugs.   Family History: family history includes Heart disease in her father; Huntington's disease in her mother.   Review of Systems A full 12 point review of systems was negative or as noted in the History of Present Illness.  Physical Exam: BP 114/71 mmHg  Pulse 106  Temp(Src) 98.6 F (37 C)  Wt 262 lb (118.842 kg)  LMP 09/13/2015   Asessement:  25 yo G3 P0020 @ 16 3/7 by LMP c/w first trimester [facility] scan presenting for MFM consultation. 1. Diabetes mellitus type 2 in obese (HCC)    Plan: We spent 30  minutes with [name] of which more than 50% was counseling and coordinating care.   Pregestational Diabetes:  Faith [name] reports she was diagnosed with type 2 diabetes at 25 years old.  She has never been on insulin and was on metformin prior to pregnancy.  Faith [name] is currently still on Metformin which was recently increased to 1000qam and 500 qpm.  Per review of her chart her baseline labs are well controlled with Hgb A1C is 5.4, TSH 1.9, Cr 0.5, and NL LFTs.  We discussed the effects of her diabetes on the pregnancy and the fetus as well as the pregnancy effects on her diabetes. We reviewed the increased risk of congenital anomalies, fetal growth disturbances, fetal death, and neonatal metabolic complications and the importance of good glucose control to minimize these risks.   We discussed increasing insulin requirements with advancing pregnancy and the need for frequent adjustment of insulin dosages to meet these needs. Her Metformin was just increased last week and unfortunately she did not have her log for review today.  From her memory, she reports fasting sugars 70-80's and post-prandials 130-160's.  We discussed that she may require insulin therapy to better control her sugars.  We also discussed dietary modifications and she reports being followed by a nutritionist.    The goals of her metformin regimen is to have: 1. Fasting a capillary blood glucose between 60 and 95 mg/dl; 2. Two hour postprandial capillary blood glucoses less than 120 mg/dl.  In addition, we have the following recommendations:  1. Baseline EKG; 2. Ophthalmological examination; 3. Fetal anatomic survey between 16-[redacted] weeks gestation.  We scheduled a detailed fetal  anatomic ultrasound in 2 weeks with [facility], feel free to cancel if the primary group prefers to do the ultrasound. 5. Fetal cardiac evaluation between 22-[redacted] weeks gestation.  A pediatric cardiology fetal echocardiogram was ordered.  6. Ultrasound  for fetal growth every 4 weeks beginning at [redacted] weeks gestation 7. Twice weekly nonstress tests beginning at [redacted] weeks gestation  8. Delivery planning will be determined once the patient is closer to her estimated due date. This will be determined by:    A. Fetal size;    B. Amniotic fluid volume;    C. Level of glucose control;    D. Other medical or obstetrical complications.    -Seizure disorder:  Faith [name] reports she recently saw Dr. Sherryll BurgerShah from neurology.  Per Faith [name] report, Dr. Sherryll BurgerShah did not recommend any medications.  She is scheduled to have an EEG on 4/8.  We are happy to provider further recommendations should medications be required at any point.  -BMI 47 - We discussed increased risks of pregnancy in the setting of obesity.  We discussed risks of wound infection if cesarean delivery is indication.  We discussed the IOM weight gain guidelines.  We would recommend daily low dose ASA (81mg ) to decrease the risk of preeclampsia.  -Asthma - Faith [name] reports mild asthma with no recent albuterol use and no controller medications.  We discussed that albuterol is safe in pregnancy if she needs it.  -Anxiety/depression - Faith [name] reports she was previously on klonopin and prozac but was taken off of these medications during pregnancy.  She reports she's doing well off medications.  We discussed increased risk of post partum depression.  We are happy to provide further recommendation is pharmacotherapy is need for anxiety/depression at any point.  Total time spent with the patient was 40 minutes with greater than 50% spent in counseling and coordination of care.  All of Faith [name]' questions were answered.  We appreciate this interesting consult and will be happy to be involved in the ongoing care of Faith [name] in anyway her obstetricians desire. We scheduled a follow up visit in 1 week to review her sugar log and determine if she is a candidate for insulin therapy.  We also scheduled a return in  2 week for detailed anatomy ultrasound.  Oran ReinSarahn Tevion Laforge, MD Maternal-Fetal Medicine [facility]

## 2016-01-10 ENCOUNTER — Ambulatory Visit
Admission: RE | Admit: 2016-01-10 | Discharge: 2016-01-10 | Disposition: A | Discharge status: Home / Self Care | Payer: Medicaid Other | Source: Ambulatory Visit | Attending: Advanced Practice Midwife | Admitting: Advanced Practice Midwife

## 2016-01-10 DIAGNOSIS — O99210 Obesity complicating pregnancy, unspecified trimester: Secondary | ICD-10-CM | POA: Insufficient documentation

## 2016-01-13 ENCOUNTER — Ambulatory Visit: Payer: Self-pay

## 2016-01-16 ENCOUNTER — Ambulatory Visit
Admission: RE | Admit: 2016-01-16 | Discharge: 2016-01-16 | Disposition: A | Discharge status: Home / Self Care | Payer: Medicaid Other | Source: Ambulatory Visit | Attending: Maternal & Fetal Medicine | Admitting: Maternal & Fetal Medicine

## 2016-01-16 VITALS — BP 112/68 | HR 100 | Temp 98.2°F | Resp 18 | Wt 245.4 lb

## 2016-01-16 DIAGNOSIS — O24912 Unspecified diabetes mellitus in pregnancy, second trimester: Secondary | ICD-10-CM | POA: Diagnosis not present

## 2016-01-16 DIAGNOSIS — Z3A17 17 weeks gestation of pregnancy: Secondary | ICD-10-CM | POA: Diagnosis not present

## 2016-01-16 DIAGNOSIS — O24919 Unspecified diabetes mellitus in pregnancy, unspecified trimester: Secondary | ICD-10-CM | POA: Insufficient documentation

## 2016-01-16 NOTE — Progress Notes (Signed)
  [facility] Follow-up Consultation   Chief Complaint: Here for blood sugar review  HPI: [name] is a 25 y.o. G3 P0020 at 435w6d by LMP and 4822w6d US [facility]  for pregestational diabetes type 2, BMI 47, seizure disorder, asthma, anxiety/depression.    The patient has been taking metformin 1000 mg in the AM and 500 mg in the PM for at least 2 weeks.  She was faithfully checking her blood sugars fasting and 2 hours after meals, but ran out of space in her log and has not recorded any in a week.  Obstetric History: G3 P0020 G1 - ectopic  G2 - ~8 week SAB, D&C   Medications: Metformin, Folic Acid, PNV, albuterol inhaler  Allergies: Patient is allergic to morphine; morphine and related; sulfasalazine; tomato; toradol; dilaudid; latex; sulfa antibiotics; tramadol; hydrocodone-acetaminophen; iohexol; lamictal; sulfur; and mushroom extract complex.   Review of Systems A full 12 point review of systems was negative or as noted in the History of Present Illness.  Physical Exam: BP 112/68 mmHg  Pulse 100  Temp(Src) 98.2 F (36.8 C)  Resp 18  Wt 245 lb 6.4 oz (111.313 kg)  SpO2 98%  LMP 09/13/2015 Body mass index is 47.93 kg/(m^2).   Review of BSs 01/03/2016- 01/09/2016 FBSs 76-92 - 0/7 > 95 2 hr PP breakfast 88-94 - 0/7 > 120 2 hr PP lunch 91-104 - 0/7 > 120 2 hr PP dinner 96-112 - 0/7 > 120  Asessement: 25 yo G3 P0020 at 2535w6d gestation with:   1. Pregestational Diabetes 2. History of seizure disorder not currently requiring medication - followed by Dr. Sherryll BurgerShah, neurologist 3. BMI 47 4. Asthma - mild, well controlled 5. Anxiety/depression - stable off of medication    Recommendations: 1. Pregestational Diabetes --  The patient has previously been counseled about the target range for her blood sugars --  She was given additional log sheets to record her sugars --  She was advised to continue  her current dose of metformin --  She may not need to check her blood sugar as frequently, as long as she does it consistently.  I would suggest she continue to check her fasting blood sugar each morning and rotate checking her postprandial blood sugars (ie check a blood sugar 2 hours after breakfast one day, 2 hours after lunch the next day and 2 hours after dinner the next)  -- The patient has not started low-dose ASA 81 mg per day, but should be encouraged to do so.  She has the additional risk factor for preeclampsia of obesity. -- The patient will return here Monday, January 20, 2016, for anatomy ultrasound. --  Please see Dr. Janeann ForehandWheeler's note from 01/06/2016 for our complete recommendations --  Should 1/3 or more of her blood sugars at any time point exceed the target range of =/> 95 for fastings or =/> 120 for 2 hours after meals, we would be happy to see her to adjust her regimen. 2. History of seizure disorder not currently requiring medication- followed by Dr. Sherryll BurgerShah, neurologist 3. BMI 47 4. Asthma - mild, well controlled 5. Anxiety/depression - stable off of medication   Total time spent with the patient was 30 minutes with greater than 50% spent in counseling and coordination of care.  Argentina PonderAndra H. Kelsie Kramp, MD [facility]

## 2016-01-20 ENCOUNTER — Ambulatory Visit
Admission: RE | Admit: 2016-01-20 | Discharge: 2016-01-20 | Disposition: A | Discharge status: Home / Self Care | Payer: Medicaid Other | Source: Ambulatory Visit | Attending: Maternal & Fetal Medicine | Admitting: Maternal & Fetal Medicine

## 2016-01-20 VITALS — BP 116/62 | HR 92 | Temp 98.0°F | Resp 18 | Ht 60.0 in | Wt 247.0 lb

## 2016-01-20 DIAGNOSIS — Z3A18 18 weeks gestation of pregnancy: Secondary | ICD-10-CM | POA: Insufficient documentation

## 2016-01-20 DIAGNOSIS — E1169 Type 2 diabetes mellitus with other specified complication: Secondary | ICD-10-CM

## 2016-01-20 DIAGNOSIS — E119 Type 2 diabetes mellitus without complications: Secondary | ICD-10-CM | POA: Diagnosis not present

## 2016-01-20 DIAGNOSIS — O24912 Unspecified diabetes mellitus in pregnancy, second trimester: Secondary | ICD-10-CM

## 2016-01-20 DIAGNOSIS — E669 Obesity, unspecified: Secondary | ICD-10-CM | POA: Diagnosis not present

## 2016-01-20 DIAGNOSIS — O24112 Pre-existing diabetes mellitus, type 2, in pregnancy, second trimester: Secondary | ICD-10-CM | POA: Diagnosis present

## 2016-03-15 ENCOUNTER — Observation Stay
Admission: EM | Admit: 2016-03-15 | Discharge: 2016-03-15 | Disposition: A | Discharge status: Home / Self Care | Payer: Medicaid Other | Attending: Certified Nurse Midwife | Admitting: Certified Nurse Midwife

## 2016-03-15 DIAGNOSIS — K219 Gastro-esophageal reflux disease without esophagitis: Secondary | ICD-10-CM | POA: Insufficient documentation

## 2016-03-15 DIAGNOSIS — Z3A26 26 weeks gestation of pregnancy: Secondary | ICD-10-CM | POA: Diagnosis not present

## 2016-03-15 DIAGNOSIS — O9989 Other specified diseases and conditions complicating pregnancy, childbirth and the puerperium: Secondary | ICD-10-CM | POA: Diagnosis not present

## 2016-03-15 DIAGNOSIS — O99612 Diseases of the digestive system complicating pregnancy, second trimester: Secondary | ICD-10-CM | POA: Insufficient documentation

## 2016-03-15 DIAGNOSIS — O24419 Gestational diabetes mellitus in pregnancy, unspecified control: Secondary | ICD-10-CM | POA: Insufficient documentation

## 2016-03-15 DIAGNOSIS — Z6841 Body Mass Index (BMI) 40.0 and over, adult: Secondary | ICD-10-CM | POA: Insufficient documentation

## 2016-03-15 DIAGNOSIS — O99212 Obesity complicating pregnancy, second trimester: Secondary | ICD-10-CM | POA: Diagnosis not present

## 2016-03-15 DIAGNOSIS — R569 Unspecified convulsions: Secondary | ICD-10-CM | POA: Diagnosis not present

## 2016-03-15 DIAGNOSIS — O99352 Diseases of the nervous system complicating pregnancy, second trimester: Secondary | ICD-10-CM | POA: Diagnosis not present

## 2016-03-15 DIAGNOSIS — O99342 Other mental disorders complicating pregnancy, second trimester: Secondary | ICD-10-CM | POA: Diagnosis not present

## 2016-03-15 DIAGNOSIS — O99512 Diseases of the respiratory system complicating pregnancy, second trimester: Secondary | ICD-10-CM | POA: Diagnosis not present

## 2016-03-15 DIAGNOSIS — J45909 Unspecified asthma, uncomplicated: Secondary | ICD-10-CM | POA: Insufficient documentation

## 2016-03-15 DIAGNOSIS — R0789 Other chest pain: Secondary | ICD-10-CM | POA: Insufficient documentation

## 2016-03-15 DIAGNOSIS — R109 Unspecified abdominal pain: Secondary | ICD-10-CM

## 2016-03-15 DIAGNOSIS — O26899 Other specified pregnancy related conditions, unspecified trimester: Secondary | ICD-10-CM

## 2016-03-15 DIAGNOSIS — Z87891 Personal history of nicotine dependence: Secondary | ICD-10-CM | POA: Diagnosis not present

## 2016-03-15 DIAGNOSIS — F329 Major depressive disorder, single episode, unspecified: Secondary | ICD-10-CM | POA: Insufficient documentation

## 2016-03-15 DIAGNOSIS — R1011 Right upper quadrant pain: Secondary | ICD-10-CM | POA: Insufficient documentation

## 2016-03-15 LAB — CBC
HEMATOCRIT: 31.3 % — AB (ref 35.0–47.0)
HEMOGLOBIN: 10.2 g/dL — AB (ref 12.0–16.0)
MCH: 23.8 pg — AB (ref 26.0–34.0)
MCHC: 32.5 g/dL (ref 32.0–36.0)
MCV: 73.3 fL — ABNORMAL LOW (ref 80.0–100.0)
Platelets: 332 10*3/uL (ref 150–440)
RBC: 4.27 MIL/uL (ref 3.80–5.20)
RDW: 17.3 % — ABNORMAL HIGH (ref 11.5–14.5)
WBC: 15.8 10*3/uL — ABNORMAL HIGH (ref 3.6–11.0)

## 2016-03-15 LAB — COMPREHENSIVE METABOLIC PANEL
ALBUMIN: 2.7 g/dL — AB (ref 3.5–5.0)
ALT: 16 U/L (ref 14–54)
ANION GAP: 6 (ref 5–15)
AST: 14 U/L — ABNORMAL LOW (ref 15–41)
Alkaline Phosphatase: 93 U/L (ref 38–126)
BUN: 7 mg/dL (ref 6–20)
CO2: 23 mmol/L (ref 22–32)
Calcium: 8.8 mg/dL — ABNORMAL LOW (ref 8.9–10.3)
Chloride: 106 mmol/L (ref 101–111)
Creatinine, Ser: 0.53 mg/dL (ref 0.44–1.00)
GFR calc non Af Amer: >60 mL/min (ref >60)
GLUCOSE: 96 mg/dL (ref 65–99)
Potassium: 3.5 mmol/L (ref 3.5–5.1)
SODIUM: 135 mmol/L (ref 135–145)
Total Bilirubin: <0.1 mg/dL — ABNORMAL LOW (ref 0.3–1.2)
Total Protein: 6.3 g/dL — ABNORMAL LOW (ref 6.5–8.1)

## 2016-03-15 LAB — LIPASE, BLOOD: LIPASE: 22 U/L (ref 11–51)

## 2016-03-15 MED ORDER — ACETAMINOPHEN 500 MG PO TABS
1000.0000 mg | ORAL_TABLET | Freq: Once | ORAL | Status: AC
  Administered 2016-03-15: 1000 mg via ORAL
  Filled 2016-03-15: qty 2

## 2016-03-15 MED ORDER — FAMOTIDINE 20 MG PO TABS
20.0000 mg | ORAL_TABLET | Freq: Once | ORAL | Status: AC
  Administered 2016-03-15: 20 mg via ORAL
  Filled 2016-03-15: qty 1

## 2016-03-15 NOTE — Discharge Instructions (Signed)
PRETERM LABOR: Includes any of the following symptoms that occur between 20-[redacted] weeks gestation. If these symptoms are not stopped, preterm labor can result in preterm delivery, placing your baby at risk.  Notify your doctor if any of the following occur: 1. Menstrual-like cramps   5. Pelvic pressure  2. Uterine contractions. These may be painless and feel like the uterus is tightening or the baby is "balling up" 6. Increase or change in vaginal discharge  3. Low, dull backache, unrelieved by heat or Tylenol  7. Vaginal bleeding  4. Intestinal cramps, with our without diarrhea, sometimes 8. A general feeling that "something is not right"   9. Leaking of fluid described as "gas pain"  Braxton Hicks Contractions Contractions of the uterus can occur throughout pregnancy. Contractions are not always a sign that you are in labor.  WHAT ARE BRAXTON HICKS CONTRACTIONS?  Contractions that occur before labor are called Braxton Hicks contractions, or false labor. Toward the end of pregnancy (32-34 weeks), these contractions can develop more often and may become more forceful. This is not true labor because these contractions do not result in opening (dilatation) and thinning of the cervix. They are sometimes difficult to tell apart from true labor because these contractions can be forceful and people have different pain tolerances. You should not feel embarrassed if you go to the hospital with false labor. Sometimes, the only way to tell if you are in true labor is for your health care provider to look for changes in the cervix. If there are no prenatal problems or other health problems associated with the pregnancy, it is completely safe to be sent home with false labor and await the onset of true labor. HOW CAN YOU TELL THE DIFFERENCE BETWEEN TRUE AND FALSE LABOR? False Labor  The contractions of false labor are usually shorter and not as hard as those of true labor.   The contractions are usually  irregular.   The contractions are often felt in the front of the lower abdomen and in the groin.   The contractions may go away when you walk around or change positions while lying down.   The contractions get weaker and are shorter lasting as time goes on.   The contractions do not usually become progressively stronger, regular, and closer together as with true labor.  True Labor  Contractions in true labor last 30-70 seconds, become very regular, usually become more intense, and increase in frequency.   The contractions do not go away with walking.   The discomfort is usually felt in the top of the uterus and spreads to the lower abdomen and low back.   True labor can be determined by your health care provider with an exam. This will show that the cervix is dilating and getting thinner.  WHAT TO REMEMBER  Keep up with your usual exercises and follow other instructions given by your health care provider.   Take medicines as directed by your health care provider.   Keep your regular prenatal appointments.   Eat and drink lightly if you think you are going into labor.   If Braxton Hicks contractions are making you uncomfortable:   Change your position from lying down or resting to walking, or from walking to resting.   Sit and rest in a tub of warm water.   Drink 2-3 glasses of water. Dehydration may cause these contractions.   Do slow and deep breathing several times an hour.  WHEN SHOULD I SEEK IMMEDIATE MEDICAL  CARE? Seek immediate medical care if:  Your contractions become stronger, more regular, and closer together.   You have fluid leaking or gushing from your vagina.   You have a fever.   You pass blood-tinged mucus.   You have vaginal bleeding.   You have continuous abdominal pain.   You have low back pain that you never had before.   You feel your baby's head pushing down and causing pelvic pressure.   Your baby is not moving  as much as it used to.    This information is not intended to replace advice given to you by your health care provider. Make sure you discuss any questions you have with your health care provider.   Document Released: 09/21/2005 Document Revised: 09/26/2013 Document Reviewed: 07/03/2013 Elsevier Interactive Patient Education 2016 [employer]. Cholestasis of Pregnancy Cholestasis refers to any condition that causes the flow of the digestive fluid (bile) produced by your liver to slow or stop. Cholestasis of pregnancy is most common toward the end of pregnancy (thirdtrimester), but it can occur any time during your pregnancy. The condition often goes away soon after your baby is born.  Cholestasis may be uncomfortable but is usually harmless to you. However, it can be harmful to your baby. Cholestasis may increase the risk that your baby will be born too early (preterm delivery).  CAUSES  The cause of cholestasis of pregnancy is not known. Pregnancy hormones may affect the way your gallbladder functions. Your gallbladder normally holds the bile from your liver until you need it to help digest fat in your diet. Pregnancy hormones may cause the flow of bile to slow down and back up into your liver. Bile may then get into your bloodstream and cause cholestasis symptoms. RISK FACTORS You may be at increased risk if:  You had cholestasis during a previous pregnancy.  You have a family history of cholestasis.  You have liver problems.  You are having twins. SIGNS AND SYMPTOMS  The most common symptom of cholestasis of pregnancy is intense itching, especially on the palms of your hands and soles of your feet. The itching can spread to the rest of your body and is often worse at night. You will not usually have a rash. Other symptoms may include:   Feeling tired.   Yellowish discoloration of your skin and the whites of your eyes (jaundice).   Dark-colored urine.   Light-colored  stools.  Poor appetite.  DIAGNOSIS  Your health care provider will take your medical history and do a physical exam. You may have blood tests to check your liver function, bile level, and bilirubin level.  TREATMENT  Treatment is meant to make you more comfortable and keep your baby safe. Your health care provider may prescribe medicine to relieve your itching. The medicine used may also improve your blood test results and help keep your baby safe. Your health care provider may also give you vitamin K before delivery to prevent excessive bleeding.  Your health care provider may want to check your baby (fetal monitoring) frequently, as often as every 2 weeks. Once your baby's lungs have developed enough, your health care provider may recommend starting (inducing) your labor and delivery by week 37 of your pregnancy. HOME CARE INSTRUCTIONS   Only use anti-itch creams and take medicines as directed by your health care provider.  Take cool baths to soothe your itching.   Keep your fingernails short to prevent skin irritation from scratching.   Keep all your  appointments for fetal monitoring. SEEK MEDICAL CARE IF:  Your symptoms get worse, even with treatment. SEEK IMMEDIATE MEDICAL CARE IF: You go into early labor at home. MAKE SURE YOU:  Understand these instructions.  Will watch your condition.  Will get help right away if you are not doing well or get worse.   This information is not intended to replace advice given to you by your health care provider. Make sure you discuss any questions you have with your health care provider.   Document Released: 09/18/2000 Document Revised: 10/12/2014 Document Reviewed: 07/14/2013 Elsevier Interactive Patient Education [employer] Elsevier Inc.

## 2016-03-15 NOTE — OB Triage Note (Signed)
Pt arrived to unit complaining of sharp RUQ pain and nausea that comes after you eat and eventually subsides starting yesterday. Patient notes history of gallstones. Denies contraction, bleedings, leaking fluid. +FM.

## 2016-03-16 NOTE — Final Progress Note (Signed)
Physician Final Progress Note  Patient ID: Faith [name] M Folmer MRN: [id number] DOB/AGE: Jun 14, 1991 25 y.o.  Admit date: 03/15/2016 Admitting provider: Leola Brazilhelsea C Ward, MD Discharge date: 03/16/2016   Admission Diagnoses: IUP at 26 wk 2 days with RUQ pain, nausea.   Discharge Diagnoses:  IUP at 26.2 weeks with RUQ tenderness and nausea Left chest wall pain Consults: None  Significant Findings/ Diagnostic Studies: 25 year old 64 52P0030 with EDC=06/19/2016 by LMP presented with c/o right sided abdominal pain and nausea that started last night after eating pasta with alfredo sauce. The pain and nausea resolved during the night, but returned when she ate the alfredo pasta again this Am and had worsened PTA. States has also been having reflux and belching with the current sx. Had a GI workup 2 years ago and gallstones were not found, but she did have reflux and was taking Zantac prior to pregnancy. She has not had diarrhea, SOB, fever, pain with taking a deep breath.  [facility] been complicated by T2DM (currently taking metformin), asthma, BMI=47, depression, seizures, hx of PCOS and smoking.  Exam: BP 123/65 mmHg  Pulse 107  Temp(Src) 98.6 F (37 C) (Oral)  Resp 20  Ht 5' (1.524 m)  Wt 109.317 kg (241 lb)  BMI 47.07 kg/m2  LMP 09/13/2015  General : in NAD, pleasant, morbidly obese WF Abdomen: truncal obesity, mild upper abdominal pain, negative for Murphy's sign, BS present Chest: tenderness over lower right rib cage, particularly in IC spaces. CTAB FHR: 140 with accels to 150s, moderate variability Toco: no contractions seen or palpated Results for orders placed or performed during the hospital encounter of 03/15/16 (from the past 24 hour(s))  Comprehensive metabolic panel     Status: Abnormal   Collection Time: 03/15/16  9:45 PM  Result Value Ref Range   Sodium 135 135 - 145 mmol/L   Potassium 3.5 3.5 - 5.1 mmol/L   Chloride 106 101 - 111 mmol/L   CO2 23 22 - 32 mmol/L   Glucose, Bld 96 65 - 99 mg/dL   BUN 7 6 - 20 mg/dL   Creatinine, Ser 4.090.53 0.44 - 1.00 mg/dL   Calcium 8.8 (L) 8.9 - 10.3 mg/dL   Total Protein 6.3 (L) 6.5 - 8.1 g/dL   Albumin 2.7 (L) 3.5 - 5.0 g/dL   AST 14 (L) 15 - 41 U/L   ALT 16 14 - 54 U/L   Alkaline Phosphatase 93 38 - 126 U/L   Total Bilirubin <0.1 (L) 0.3 - 1.2 mg/dL   GFR calc non Af Amer >60 >60 mL/min   GFR calc Af Amer >60 >60 mL/min   Anion gap 6 5 - 15  CBC     Status: Abnormal   Collection Time: 03/15/16  9:45 PM  Result Value Ref Range   WBC 15.8 (H) 3.6 - 11.0 K/uL   RBC 4.27 3.80 - 5.20 MIL/uL   Hemoglobin 10.2 (L) 12.0 - 16.0 g/dL   HCT 81.131.3 (L) 91.435.0 - 78.247.0 %   MCV 73.3 (L) 80.0 - 100.0 fL   MCH 23.8 (L) 26.0 - 34.0 pg   MCHC 32.5 32.0 - 36.0 g/dL   RDW 95.617.3 (H) 21.311.5 - 08.614.5 %   Platelets 332 150 - 440 K/uL  Lipase, blood     Status: None   Collection Time: 03/15/16  9:45 PM  Result Value Ref Range   Lipase 22 11 - 51 U/L  A: IUP at 26wk2d with RUQ tenderness  and reflux. Normal liver enzymes and lipase. No acute abdomen Chest wall pain P: Will discharge home since pain has resolved enough that patient fell asleep Heating pad and Tylenol for chest wall pain Pepcid for reflux FU this week as scheduled at office Consider repeating GB ultrasound if sx persist.   Procedures: none  Discharge Condition: stable  Disposition: 01-Home or Self Care  Diet: Regular diet  Discharge Activity: Activity as tolerated        Follow-up Information    Follow up with Conard Novak, MD. Go on 03/19/2016.   Specialty:  Obstetrics and Gynecology   Why:  As needed, If symptoms worsen   Contact information:   [address] [place] [address] [phone]       Total time spent taking care of this patient: 15 minutes  Signed: Farrel Conners 03/16/2016, 12:41 AM

## 2016-04-07 ENCOUNTER — Observation Stay
Admission: EM | Admit: 2016-04-07 | Discharge: 2016-04-08 | Disposition: A | Discharge status: Home / Self Care | Payer: Medicaid Other | Attending: Obstetrics & Gynecology | Admitting: Obstetrics & Gynecology

## 2016-04-07 DIAGNOSIS — Z3A30 30 weeks gestation of pregnancy: Secondary | ICD-10-CM | POA: Diagnosis not present

## 2016-04-07 DIAGNOSIS — R102 Pelvic and perineal pain: Secondary | ICD-10-CM | POA: Diagnosis present

## 2016-04-07 DIAGNOSIS — O9989 Other specified diseases and conditions complicating pregnancy, childbirth and the puerperium: Principal | ICD-10-CM | POA: Insufficient documentation

## 2016-04-07 DIAGNOSIS — O210 Mild hyperemesis gravidarum: Secondary | ICD-10-CM | POA: Insufficient documentation

## 2016-04-07 DIAGNOSIS — O24912 Unspecified diabetes mellitus in pregnancy, second trimester: Secondary | ICD-10-CM

## 2016-04-07 MED ORDER — ONDANSETRON HCL 4 MG/2ML IJ SOLN: 4.0000 mg | Freq: Four times a day (QID) | INTRAMUSCULAR | Status: DC | PRN

## 2016-04-08 DIAGNOSIS — O9989 Other specified diseases and conditions complicating pregnancy, childbirth and the puerperium: Secondary | ICD-10-CM | POA: Diagnosis not present

## 2016-04-08 LAB — URINALYSIS COMPLETE WITH MICROSCOPIC (ARMC ONLY)
BACTERIA UA: NONE SEEN
Bilirubin Urine: NEGATIVE
GLUCOSE, UA: 50 mg/dL — AB
Hgb urine dipstick: NEGATIVE
Ketones, ur: NEGATIVE mg/dL
NITRITE: NEGATIVE
Protein, ur: 30 mg/dL — AB
SPECIFIC GRAVITY, URINE: 1.029 (ref 1.005–1.030)
pH: 6 (ref 5.0–8.0)

## 2016-04-08 NOTE — Discharge Summary (Signed)
Patient discharged with instructions on follow up appointment, oral hydration, when to seek medical attention, and abdominal pain during pregnancy. Patient ambulatory at discharge with steady gait. Patient discharged with family.

## 2016-05-22 LAB — OB RESULTS CONSOLE GC/CHLAMYDIA
Chlamydia: NEGATIVE
GC PROBE AMP, GENITAL: NEGATIVE

## 2016-05-22 LAB — OB RESULTS CONSOLE GBS: STREP GROUP B AG: POSITIVE

## 2016-06-05 DIAGNOSIS — Z9289 Personal history of other medical treatment: Secondary | ICD-10-CM

## 2016-06-05 HISTORY — DX: Personal history of other medical treatment: Z92.89

## 2016-06-19 ENCOUNTER — Encounter: Payer: Self-pay | Admitting: Emergency Medicine

## 2016-06-19 ENCOUNTER — Inpatient Hospital Stay
Admission: EM | Admit: 2016-06-19 | Discharge: 2016-06-25 | DRG: 765 | Disposition: A | Discharge status: Home / Self Care | Payer: Medicaid Other | Attending: Certified Nurse Midwife | Admitting: Certified Nurse Midwife

## 2016-06-19 DIAGNOSIS — Z91018 Allergy to other foods: Secondary | ICD-10-CM

## 2016-06-19 DIAGNOSIS — Z91040 Latex allergy status: Secondary | ICD-10-CM | POA: Diagnosis not present

## 2016-06-19 DIAGNOSIS — Z6841 Body Mass Index (BMI) 40.0 and over, adult: Secondary | ICD-10-CM | POA: Diagnosis not present

## 2016-06-19 DIAGNOSIS — Z882 Allergy status to sulfonamides status: Secondary | ICD-10-CM | POA: Diagnosis not present

## 2016-06-19 DIAGNOSIS — O99824 Streptococcus B carrier state complicating childbirth: Secondary | ICD-10-CM | POA: Diagnosis present

## 2016-06-19 DIAGNOSIS — Z885 Allergy status to narcotic agent status: Secondary | ICD-10-CM

## 2016-06-19 DIAGNOSIS — D649 Anemia, unspecified: Secondary | ICD-10-CM | POA: Diagnosis not present

## 2016-06-19 DIAGNOSIS — Z3A40 40 weeks gestation of pregnancy: Secondary | ICD-10-CM

## 2016-06-19 DIAGNOSIS — O1204 Gestational edema, complicating childbirth: Secondary | ICD-10-CM | POA: Diagnosis present

## 2016-06-19 DIAGNOSIS — O9902 Anemia complicating childbirth: Secondary | ICD-10-CM | POA: Diagnosis present

## 2016-06-19 DIAGNOSIS — O480 Post-term pregnancy: Secondary | ICD-10-CM | POA: Diagnosis present

## 2016-06-19 DIAGNOSIS — O99334 Smoking (tobacco) complicating childbirth: Secondary | ICD-10-CM | POA: Diagnosis present

## 2016-06-19 DIAGNOSIS — Z888 Allergy status to other drugs, medicaments and biological substances status: Secondary | ICD-10-CM

## 2016-06-19 DIAGNOSIS — O9952 Diseases of the respiratory system complicating childbirth: Secondary | ICD-10-CM | POA: Diagnosis present

## 2016-06-19 DIAGNOSIS — J45909 Unspecified asthma, uncomplicated: Secondary | ICD-10-CM | POA: Diagnosis present

## 2016-06-19 DIAGNOSIS — O99214 Obesity complicating childbirth: Secondary | ICD-10-CM | POA: Diagnosis present

## 2016-06-19 DIAGNOSIS — O24425 Gestational diabetes mellitus in childbirth, controlled by oral hypoglycemic drugs: Secondary | ICD-10-CM | POA: Diagnosis present

## 2016-06-19 DIAGNOSIS — F909 Attention-deficit hyperactivity disorder, unspecified type: Secondary | ICD-10-CM | POA: Diagnosis present

## 2016-06-19 DIAGNOSIS — F319 Bipolar disorder, unspecified: Secondary | ICD-10-CM | POA: Diagnosis present

## 2016-06-19 DIAGNOSIS — Z98891 History of uterine scar from previous surgery: Secondary | ICD-10-CM

## 2016-06-19 HISTORY — DX: Depression, unspecified: F32.A

## 2016-06-19 HISTORY — DX: Major depressive disorder, single episode, unspecified: F32.9

## 2016-06-20 ENCOUNTER — Encounter: Payer: Self-pay | Admitting: Certified Nurse Midwife

## 2016-06-20 LAB — GLUCOSE, CAPILLARY
GLUCOSE-CAPILLARY: 90 mg/dL (ref 65–99)
GLUCOSE-CAPILLARY: 105 mg/dL — AB (ref 65–99)
GLUCOSE-CAPILLARY: 90 mg/dL (ref 65–99)
Glucose-Capillary: 106 mg/dL — ABNORMAL HIGH (ref 65–99)
Glucose-Capillary: 82 mg/dL (ref 65–99)
Glucose-Capillary: 89 mg/dL (ref 65–99)
Glucose-Capillary: 93 mg/dL (ref 65–99)

## 2016-06-20 LAB — COMPREHENSIVE METABOLIC PANEL
ALK PHOS: 124 U/L (ref 38–126)
ALT: 14 U/L (ref 14–54)
ANION GAP: 10 (ref 5–15)
AST: 14 U/L — ABNORMAL LOW (ref 15–41)
Albumin: 2.7 g/dL — ABNORMAL LOW (ref 3.5–5.0)
BUN: 10 mg/dL (ref 6–20)
CALCIUM: 8.5 mg/dL — AB (ref 8.9–10.3)
CHLORIDE: 107 mmol/L (ref 101–111)
CO2: 18 mmol/L — ABNORMAL LOW (ref 22–32)
CREATININE: 0.58 mg/dL (ref 0.44–1.00)
Glucose, Bld: 113 mg/dL — ABNORMAL HIGH (ref 65–99)
Potassium: 4 mmol/L (ref 3.5–5.1)
Sodium: 135 mmol/L (ref 135–145)
Total Bilirubin: 0.2 mg/dL — ABNORMAL LOW (ref 0.3–1.2)
Total Protein: 6.3 g/dL — ABNORMAL LOW (ref 6.5–8.1)

## 2016-06-20 LAB — CBC
HCT: 29.4 % — ABNORMAL LOW (ref 35.0–47.0)
Hemoglobin: 9.5 g/dL — ABNORMAL LOW (ref 12.0–16.0)
MCH: 21.3 pg — AB (ref 26.0–34.0)
MCHC: 32.2 g/dL (ref 32.0–36.0)
MCV: 66 fL — ABNORMAL LOW (ref 80.0–100.0)
PLATELETS: 349 10*3/uL (ref 150–440)
RBC: 4.46 MIL/uL (ref 3.80–5.20)
RDW: 19.6 % — ABNORMAL HIGH (ref 11.5–14.5)
WBC: 14.3 10*3/uL — ABNORMAL HIGH (ref 3.6–11.0)

## 2016-06-20 MED ORDER — LACTATED RINGERS IV SOLN
INTRAVENOUS | Status: DC
  Administered 2016-06-20: 125 mL/h via INTRAVENOUS
  Administered 2016-06-20: 02:00:00 via INTRAVENOUS
  Administered 2016-06-21: 125 mL/h via INTRAVENOUS
  Administered 2016-06-21: 13:00:00 via INTRAVENOUS

## 2016-06-20 MED ORDER — OXYTOCIN 40 UNITS IN LACTATED RINGERS INFUSION - SIMPLE MED
1.0000 m[IU]/min | INTRAVENOUS | Status: DC
  Administered 2016-06-21: 1.67 m[IU]/min via INTRAVENOUS
  Administered 2016-06-22: 24 m[IU]/min via INTRAVENOUS
  Filled 2016-06-20: qty 1000

## 2016-06-20 MED ORDER — SODIUM CHLORIDE 0.9 % IV SOLN
2.0000 g | Freq: Once | INTRAVENOUS | Status: AC
  Administered 2016-06-20: 2 g via INTRAVENOUS
  Filled 2016-06-20: qty 2000

## 2016-06-20 MED ORDER — SODIUM CHLORIDE 0.9 % IV SOLN
1.0000 g | INTRAVENOUS | Status: AC
  Administered 2016-06-20 – 2016-06-22 (×11): 1 g via INTRAVENOUS
  Filled 2016-06-20 (×10): qty 1000

## 2016-06-20 MED ORDER — LIDOCAINE HCL (PF) 1 % IJ SOLN: 30.0000 mL | INTRAMUSCULAR | Status: DC | PRN

## 2016-06-20 MED ORDER — SODIUM CHLORIDE 0.9 % IV SOLN
1.0000 g | INTRAVENOUS | Status: DC
  Administered 2016-06-20: 1 g via INTRAVENOUS
  Filled 2016-06-20: qty 1000

## 2016-06-20 MED ORDER — OXYTOCIN 40 UNITS IN LACTATED RINGERS INFUSION - SIMPLE MED
2.5000 [IU]/h | INTRAVENOUS | Status: DC
  Administered 2016-06-22: 1000 mL via INTRAVENOUS
  Filled 2016-06-20 (×2): qty 1000

## 2016-06-20 MED ORDER — TERBUTALINE SULFATE 1 MG/ML IJ SOLN: 0.2500 mg | Freq: Once | INTRAMUSCULAR | Status: DC | PRN

## 2016-06-20 MED ORDER — AMMONIA AROMATIC IN INHA: 0.3000 mL | Freq: Once | RESPIRATORY_TRACT | Status: DC | PRN

## 2016-06-20 MED ORDER — LACTATED RINGERS IV SOLN: 500.0000 mL | INTRAVENOUS | Status: DC | PRN

## 2016-06-20 MED ORDER — MISOPROSTOL 200 MCG PO TABS: 800.0000 ug | ORAL_TABLET | Freq: Once | ORAL | Status: DC | PRN

## 2016-06-20 MED ORDER — FENTANYL CITRATE (PF) 100 MCG/2ML IJ SOLN
25.0000 ug | Freq: Once | INTRAMUSCULAR | Status: AC
  Administered 2016-06-20: 25 ug via INTRAVENOUS
  Filled 2016-06-20: qty 2

## 2016-06-20 MED ORDER — OXYTOCIN BOLUS FROM INFUSION: 500.0000 mL | Freq: Once | INTRAVENOUS | Status: DC

## 2016-06-20 MED ORDER — DINOPROSTONE 10 MG VA INST
10.0000 mg | VAGINAL_INSERT | Freq: Once | VAGINAL | Status: AC
  Administered 2016-06-20: 10 mg via VAGINAL
  Filled 2016-06-20: qty 1

## 2016-06-20 MED ORDER — ONDANSETRON HCL 4 MG/2ML IJ SOLN
4.0000 mg | Freq: Four times a day (QID) | INTRAMUSCULAR | Status: DC | PRN
  Administered 2016-06-22: 4 mg via INTRAVENOUS

## 2016-06-20 NOTE — Progress Notes (Signed)
Requesting Dr. Jean RosenthalJackson come to reassess fetal position since FHR tracing best in left upper outer quadrant of abdomen.  Aware that ctx are irregular and FHR is reassuring/reactive when tracing continuously, but difficult to maintain CEFM without hand-holding monitor due to fetal mov't and pt positioning.  MD will be in to assess.

## 2016-06-20 NOTE — Progress Notes (Addendum)
Rn to the bedside to adjust EFM - fetal heart rate located in LLQ - 140s bpm.

## 2016-06-20 NOTE — Progress Notes (Signed)
Cervidil removed.

## 2016-06-20 NOTE — H&P (Signed)
Date of Initial H&P:06/15/2016  History reviewed (and changes noted below), patient examined, no change in status, stable for induction  25 year old G28 P27 with EDC=06/19/2016 by LMP=09/12/2016 presented at 40 weeks for IOL due to T2DM and obesity (starting BMI=47 with 20# weight change). Currently takes 1000 mgm Metformin in Am and 500 mgm in PM with dinner meal. Blood sugars have been fairly well controlled. Prenatal care also remarkable for asthma, grand mal seizures, and depression. Was on Vimpat for seizures and prozac /zoloft for depression, but they were both discontinued during her pregnancy. Hx of ectopic pregnancy with right salpingectomy. Review of care everywhere: ADHD and bipolar disorder also on problem list Last EFW 8/17=6#8oz (59.5%). AFIs are normal to high normal. NSTs reactive  Current medications: PNV, Metformin 1000mg m in Am and 500 mgm in PM, Proair inhaler( last used several weeks ago)  Allergies: many allergies including sulfa (hives), dilaudid (hives), latex (rash), tramadol( swelling, hives), toradol (anaphyllaxis/hives),morphine (severe bradycardia-on life support),  Iohexol (unknown rxn), hydrocodone(unknown rxn), lamictal (unknown), tomato (anaphyllaxis), mushroom combination #1 (swelling) Smoking 6-7 cigs/day.\ Exam:  Patient Vitals for the past 24 hrs:  BP Temp Temp src Pulse Resp  06/20/16 0244 (!) 117/59 - - 99 (!) 21  06/20/16 0003 124/70 98.3 F (36.8 C) Oral (!) 117 19   General: in NAD, morbidly obese Ultrasound: cephalic presentation, anterior placenta FHR: 140s with accelerations to 150s to 160s, moderate variability Toco: irregular, mild contractions Cervix: 1/50-60%/-2 to -3  Results for orders placed or performed during the hospital encounter of 06/19/16 (from the past 24 hour(s))  Glucose, capillary     Status: Abnormal   Collection Time: 06/20/16 12:35 AM  Result Value Ref Range   Glucose-Capillary 106 (H) 65 - 99 mg/dL  CBC     Status: Abnormal    Collection Time: 06/20/16  1:53 AM  Result Value Ref Range   WBC 14.3 (H) 3.6 - 11.0 K/uL   RBC 4.46 3.80 - 5.20 MIL/uL   Hemoglobin 9.5 (L) 12.0 - 16.0 g/dL   HCT 09.8 (L) 11.9 - 14.7 %   MCV 66.0 (L) 80.0 - 100.0 fL   MCH 21.3 (L) 26.0 - 34.0 pg   MCHC 32.2 32.0 - 36.0 g/dL   RDW 82.9 (H) 56.2 - 13.0 %   Platelets 349 150 - 440 K/uL  Type and screen [facility]     Status: None   Collection Time: 06/20/16  1:53 AM  Result Value Ref Range   ABO/RH(D) O POS    Antibody Screen NEG    Sample Expiration 06/23/2016   Comprehensive metabolic panel     Status: Abnormal   Collection Time: 06/20/16  1:53 AM  Result Value Ref Range   Sodium 135 135 - 145 mmol/L   Potassium 4.0 3.5 - 5.1 mmol/L   Chloride 107 101 - 111 mmol/L   CO2 18 (L) 22 - 32 mmol/L   Glucose, Bld 113 (H) 65 - 99 mg/dL   BUN 10 6 - 20 mg/dL   Creatinine, Ser 8.65 0.44 - 1.00 mg/dL   Calcium 8.5 (L) 8.9 - 10.3 mg/dL   Total Protein 6.3 (L) 6.5 - 8.1 g/dL   Albumin 2.7 (L) 3.5 - 5.0 g/dL   AST 14 (L) 15 - 41 U/L   ALT 14 14 - 54 U/L   Alkaline Phosphatase 124 38 - 126 U/L   Total Bilirubin 0.2 (L) 0.3 - 1.2 mg/dL   GFR calc non Af  Amer >60 >60 mL/min   GFR calc Af Amer >60 >60 mL/min   Anion gap 10 5 - 15  Glucose, capillary     Status: None   Collection Time: 06/20/16  2:42 AM  Result Value Ref Range   Glucose-Capillary 93 65 - 99 mg/dL    O POS/RI/ VI/ GBS positive Breast and Bottle  A: IUP at 40wk1d for IOL. Bishop score=5 T2DM-normal FSBS History of depression-at risk for PPD Seizures-not on medication Asthma GBS positive  P: PLan Cervidil for unripe cervix. Discussed with patient-monitor for hyperstimulation, fetal tolerance. Patient aware of increased risk of CS with IOL Start GBS PPX FSBS q2-WNL, will recheck in 4 hours Monitor postpartum for depression vs restarting medication prophyllactically Monitor for seizure activity  Shaely Gadberry, CNM

## 2016-06-20 NOTE — Progress Notes (Signed)
Patient ID: Faith [name], female   DOB: 12-18-1990, 25 y.o.   MRN: [id number]  Labor Check  Subj:  Complaints: none   Obj:  BP (!) 112/53 (BP Location: Right Arm)   Pulse 89   Temp 98.7 F (37.1 C) (Oral)   Resp 18   LMP 09/13/2015     Cervix: Dilation: 1.5 / Effacement (%): 50 / Station: -3  Baseline FHR: 135    Variability: moderate    Accelerations: present    Decelerations: absent Contractions: absent frequency: irregular  A/P: 25 y.o. 524P0030 female at 631w1d with IOL for GDM, morbid obesity.  1.  Labor: IOL, place misoprostol  2.  FWB: reassuring overall, Overall assessment: category 1  3.  GBS positive  4.  Pain: prn 5.  Recheck: prn   Thomasene MohairStephen Jackson, MD 06/20/2016 11:52 PM

## 2016-06-21 ENCOUNTER — Inpatient Hospital Stay: Payer: Medicaid Other | Admitting: Anesthesiology

## 2016-06-21 ENCOUNTER — Encounter: Payer: Self-pay | Admitting: *Deleted

## 2016-06-21 LAB — RPR: RPR Ser Ql: NONREACTIVE

## 2016-06-21 LAB — GLUCOSE, CAPILLARY
GLUCOSE-CAPILLARY: 79 mg/dL (ref 65–99)
GLUCOSE-CAPILLARY: 77 mg/dL (ref 65–99)
GLUCOSE-CAPILLARY: 79 mg/dL (ref 65–99)
Glucose-Capillary: 82 mg/dL (ref 65–99)
Glucose-Capillary: 88 mg/dL (ref 65–99)
Glucose-Capillary: 92 mg/dL (ref 65–99)

## 2016-06-21 LAB — PLATELET COUNT: Platelets: 299 10*3/uL (ref 150–440)

## 2016-06-21 MED ORDER — NALOXONE HCL 0.4 MG/ML IJ SOLN: 0.4000 mg | INTRAMUSCULAR | Status: DC | PRN

## 2016-06-21 MED ORDER — FENTANYL 2.5 MCG/ML W/ROPIVACAINE 0.2% IN NS 100 ML EPIDURAL INFUSION (ARMC-ANES)
EPIDURAL | Status: DC | PRN
  Administered 2016-06-21: 10 mL/h via EPIDURAL

## 2016-06-21 MED ORDER — DIPHENHYDRAMINE HCL 25 MG PO CAPS: 25.0000 mg | ORAL_CAPSULE | ORAL | Status: DC | PRN

## 2016-06-21 MED ORDER — NALBUPHINE HCL 10 MG/ML IJ SOLN: 5.0000 mg | Freq: Once | INTRAMUSCULAR | Status: DC | PRN

## 2016-06-21 MED ORDER — MISOPROSTOL 25 MCG QUARTER TABLET
25.0000 ug | ORAL_TABLET | ORAL | Status: DC | PRN
  Administered 2016-06-21: 25 ug via VAGINAL

## 2016-06-21 MED ORDER — SCOPOLAMINE 1 MG/3DAYS TD PT72: 1.0000 | MEDICATED_PATCH | Freq: Once | TRANSDERMAL | Status: DC

## 2016-06-21 MED ORDER — FENTANYL CITRATE (PF) 100 MCG/2ML IJ SOLN
25.0000 ug | INTRAMUSCULAR | Status: DC | PRN
  Administered 2016-06-21 (×5): 25 ug via INTRAVENOUS
  Filled 2016-06-21: qty 2

## 2016-06-21 MED ORDER — ACETAMINOPHEN 325 MG PO TABS
650.0000 mg | ORAL_TABLET | Freq: Four times a day (QID) | ORAL | Status: DC
  Administered 2016-06-22: 650 mg via ORAL
  Filled 2016-06-21: qty 2

## 2016-06-21 MED ORDER — FENTANYL 2.5 MCG/ML W/ROPIVACAINE 0.2% IN NS 100 ML EPIDURAL INFUSION (ARMC-ANES)
10.0000 mL/h | EPIDURAL | Status: DC
  Administered 2016-06-21 – 2016-06-22 (×3): 10 mL/h via EPIDURAL
  Filled 2016-06-21 (×2): qty 100

## 2016-06-21 MED ORDER — FENTANYL 2.5 MCG/ML W/ROPIVACAINE 0.2% IN NS 100 ML EPIDURAL INFUSION (ARMC-ANES)
EPIDURAL | Status: AC
  Filled 2016-06-21: qty 100

## 2016-06-21 MED ORDER — MEPERIDINE HCL 25 MG/ML IJ SOLN: 6.2500 mg | INTRAMUSCULAR | Status: DC | PRN

## 2016-06-21 MED ORDER — NALBUPHINE HCL 10 MG/ML IJ SOLN: 5.0000 mg | INTRAMUSCULAR | Status: DC | PRN

## 2016-06-21 MED ORDER — NALOXONE HCL 2 MG/2ML IJ SOSY
1.0000 ug/kg/h | PREFILLED_SYRINGE | INTRAVENOUS | Status: DC | PRN
  Filled 2016-06-21: qty 2

## 2016-06-21 MED ORDER — LIDOCAINE-EPINEPHRINE (PF) 1.5 %-1:200000 IJ SOLN
INTRAMUSCULAR | Status: DC | PRN
  Administered 2016-06-21: 3 mL

## 2016-06-21 MED ORDER — TERBUTALINE SULFATE 1 MG/ML IJ SOLN: 0.2500 mg | Freq: Once | INTRAMUSCULAR | Status: DC | PRN

## 2016-06-21 MED ORDER — ONDANSETRON HCL 4 MG/2ML IJ SOLN: 4.0000 mg | Freq: Three times a day (TID) | INTRAMUSCULAR | Status: DC | PRN

## 2016-06-21 MED ORDER — MISOPROSTOL 25 MCG QUARTER TABLET
ORAL_TABLET | ORAL | Status: AC
  Administered 2016-06-21: 25 ug via VAGINAL
  Filled 2016-06-21: qty 0.25

## 2016-06-21 MED ORDER — FENTANYL CITRATE (PF) 100 MCG/2ML IJ SOLN
25.0000 ug | Freq: Once | INTRAMUSCULAR | Status: AC
  Administered 2016-06-21: 25 ug via INTRAVENOUS
  Filled 2016-06-21: qty 2

## 2016-06-21 MED ORDER — DIPHENHYDRAMINE HCL 50 MG/ML IJ SOLN: 12.5000 mg | INTRAMUSCULAR | Status: DC | PRN

## 2016-06-21 MED ORDER — SODIUM CHLORIDE 0.9 % IV SOLN
INTRAVENOUS | Status: DC | PRN
  Administered 2016-06-21 – 2016-06-22 (×5): 5 mL via EPIDURAL

## 2016-06-21 MED ORDER — SODIUM CHLORIDE 0.9% FLUSH: 3.0000 mL | INTRAVENOUS | Status: DC | PRN

## 2016-06-21 NOTE — Anesthesia Preprocedure Evaluation (Addendum)
Anesthesia Evaluation  Patient identified by MRN, date of birth, ID band Patient awake    Reviewed: Allergy & Precautions, H&P , NPO status , Patient's Chart, lab work & pertinent test results  History of Anesthesia Complications Negative for: history of anesthetic complications  Airway Mallampati: III  TM Distance: >3 FB Neck ROM: Full    Dental no notable dental hx. (+) Poor Dentition, Chipped   Pulmonary asthma (mild intermittent, uses albuterol once every 1-2 weeks) , Current Smoker,    breath sounds clear to auscultation- rhonchi (-) wheezing      Cardiovascular (-) hypertension(-) CAD and (-) Past MI  Rhythm:Regular Rate:Normal - Systolic murmurs and - Diastolic murmurs    Neuro/Psych  Headaches, Seizures - (not on antiepileptic therapy, no seizures during pregnancy),  Depression Bipolar Disorder    GI/Hepatic negative GI ROS, Neg liver ROS,   Endo/Other  diabetes, GestationalMorbid obesity  Renal/GU negative Renal ROS     Musculoskeletal   Abdominal (+) + obese,   Peds  Hematology negative hematology ROS (+)   Anesthesia Other Findings Past Medical History: No date: Asthma No date: Depression No date: Diabetes mellitus without complication (HCC) No date: Gallstones No date: Heart murmur No date: Migraines No date: Ovarian cyst No date: Seizures (HCC) No date: Tubal pregnancy   Reproductive/Obstetrics (+) Pregnancy                            Anesthesia Physical Anesthesia Plan  ASA: III  Anesthesia Plan: Epidural   Post-op Pain Management:    Induction:   Airway Management Planned:   Additional Equipment:   Intra-op Plan:   Post-operative Plan:   Informed Consent: I have reviewed the patients History and Physical, chart, labs and discussed the procedure including the risks, benefits and alternatives for the proposed anesthesia with the patient or authorized  representative who has indicated his/her understanding and acceptance.     Plan Discussed with: Anesthesiologist  Anesthesia Plan Comments: (Plan to use epidural for C section)       Lab Results  Component Value Date   WBC 14.3 (H) 06/20/2016   HGB 9.5 (L) 06/20/2016   HCT 29.4 (L) 06/20/2016   MCV 66.0 (L) 06/20/2016   PLT 299 06/21/2016    Anesthesia Quick Evaluation

## 2016-06-21 NOTE — Progress Notes (Signed)
Patient ID: Faith [name], female   DOB: 04-30-1991, 25 y.o.   MRN: [id number]  Labor Check  Subj:  Complaints: comfortable   Obj:  BP 116/60 (BP Location: Right Arm)   Pulse 89   Temp 98 F (36.7 C) (Oral)   Resp 18   LMP 09/13/2015     Cervix: could not reach Baseline FHR: 140 bpm    Variability: moderate    Accelerations: present    Decelerations: absent Contractions: present frequency: tracing poorly  BSUS: cephalic presentation  A/P: 25 y.o. 614P0030 female at 6374w2d with IOL for GDM.  1.  Labor: start pitocin for suspected AROM  2.  FWB: reassuring, Overall assessment: category 1  3.  GBS positive - ampicillin  4.  Pain: prn 5.  Recheck: prn, minimize cervical checks  Thomasene MohairStephen Calleigh Lafontant, MD 06/21/2016 6:20 AM

## 2016-06-21 NOTE — Progress Notes (Signed)
Patient ID: Faith [name], female   DOB: 1990-12-31, 25 y.o.   MRN: [id number] Labor Check  Subj:  Complaints: some discomfort with contractions   Obj:  BP (!) 95/57 (BP Location: Right Arm)   Pulse 98   Temp 98.7 F (37.1 C) (Oral)   Resp 18   LMP 09/13/2015  Dose (milli-units/min) Oxytocin: 7.333 milli-units/min  Cervix: Dilation: 3 / Effacement (%): 80 / Station: -1  Baseline FHR: 130    Variability: moderate    Accelerations: present    Decelerations: absent Contractions: present frequency: 3 q 10 min  A/P: 25 y.o. 214P0030 female at 5841w2d with IOL for GDM.  1.  Labor: continue pitocin per protocol,  AROM of forebag (unclear because meconium w rupture of forebag and none when fluid leakage previously reported).  IUPC placed. Plan for placing FSE later.  2.  FWB: reassuring, Overall assessment: category 1  3.  GBS Postive - Ampicillin  4.  Pain: prn, may have epidural when ready 5.  Recheck: 2 hours or prn.   Thomasene MohairStephen Brecken Dewoody, MD 06/21/2016 12:39 PM

## 2016-06-21 NOTE — Progress Notes (Signed)
Patient ID: Faith [name], female   DOB: December 13, 1990, 25 y.o.   MRN: [id number] Labor Check  Subj:  Complaints: comfortable with epidural   Obj:  BP 110/62   Pulse 67   Temp 98.1 F (36.7 C) (Oral)   Resp 18   LMP 09/13/2015   SpO2 98%  Dose (milli-units/min) Oxytocin: 18 milli-units/min  Cervix: Dilation: 4.5 / Effacement (%): 100 / Station: 0   IUPC/FSE placed without difficulty.  Foley Catheter placed without difficulty Baseline FHR: 120 bpm    Variability: moderate    Accelerations: present    Decelerations: absent Contractions: present frequency: 3 q 10 min  A/P: 25 y.o. G4P0030 female at 2641w2d with IOL.  1.  Labor: continue pitocin with titrate to adequacy. Finally able to replace IUPC and add FSE now that she has an epidural. She has been highly intolerant of prolonged exams (minus the one I was able to place the IUPC earlier).  Discussed plan of action with patient who voiced understanding. 2.  FWB: reassuring, Overall assessment: category 1  3.  GBS positive - ampicillin  4.  Pain: epidural 5.  Recheck: with adequacy   Thomasene MohairStephen Brin Ruggerio, MD 06/21/2016 10:36 PM

## 2016-06-21 NOTE — Anesthesia Procedure Notes (Signed)
Epidural Patient location during procedure: OB Start time: 06/21/2016 8:34 PM End time: 06/21/2016 8:52 PM  Staffing Anesthesiologist: Alver FisherPENWARDEN, Eira Alpert Performed: anesthesiologist   Preanesthetic Checklist Completed: patient identified, site marked, surgical consent, pre-op evaluation, timeout performed, IV checked, risks and benefits discussed and monitors and equipment checked  Epidural Patient position: sitting Prep: ChloraPrep Patient monitoring: heart rate, continuous pulse ox and blood pressure Approach: midline Location: L4-L5 Injection technique: LOR saline  Needle:  Needle type: Tuohy  Needle gauge: 18 G Needle length: 9 cm and 9 Needle insertion depth: 7.5 cm Catheter type: closed end flexible Catheter size: 20 Guage Catheter at skin depth: 13 cm Test dose: negative (0.125% bupivacaine)  Assessment Events: blood not aspirated, injection not painful, no injection resistance, negative IV test and no paresthesia  Additional Notes   Patient tolerated the insertion well without complications.  perifix epidural tray Lot #[id number], exp 9/30/18Reason for block:procedure for pain

## 2016-06-22 ENCOUNTER — Encounter: Payer: Self-pay | Admitting: Anesthesiology

## 2016-06-22 ENCOUNTER — Encounter
Admission: EM | Disposition: A | Discharge status: Home / Self Care | Payer: Self-pay | Source: Home / Self Care | Attending: Certified Nurse Midwife

## 2016-06-22 LAB — BASIC METABOLIC PANEL
ANION GAP: 9 (ref 5–15)
BUN: 8 mg/dL (ref 6–20)
CO2: 20 mmol/L — ABNORMAL LOW (ref 22–32)
Calcium: 8.3 mg/dL — ABNORMAL LOW (ref 8.9–10.3)
Chloride: 105 mmol/L (ref 101–111)
Creatinine, Ser: 0.69 mg/dL (ref 0.44–1.00)
GFR calc Af Amer: >60 mL/min (ref >60)
Glucose, Bld: 102 mg/dL — ABNORMAL HIGH (ref 65–99)
POTASSIUM: 3.7 mmol/L (ref 3.5–5.1)
SODIUM: 134 mmol/L — AB (ref 135–145)

## 2016-06-22 LAB — GLUCOSE, CAPILLARY
GLUCOSE-CAPILLARY: 78 mg/dL (ref 65–99)
GLUCOSE-CAPILLARY: 90 mg/dL (ref 65–99)
GLUCOSE-CAPILLARY: 87 mg/dL (ref 65–99)
Glucose-Capillary: 106 mg/dL — ABNORMAL HIGH (ref 65–99)
Glucose-Capillary: 77 mg/dL (ref 65–99)

## 2016-06-22 SURGERY — Surgical Case
Anesthesia: Epidural

## 2016-06-22 MED ORDER — FENTANYL CITRATE (PF) 100 MCG/2ML IJ SOLN: 25.0000 ug | INTRAMUSCULAR | Status: DC | PRN

## 2016-06-22 MED ORDER — BUPIVACAINE 0.25 % ON-Q PUMP DUAL CATH 400 ML: 400.0000 mL | INJECTION | Status: DC

## 2016-06-22 MED ORDER — FENTANYL CITRATE (PF) 100 MCG/2ML IJ SOLN
INTRAMUSCULAR | Status: DC | PRN
  Administered 2016-06-22: 100 ug via EPIDURAL
  Administered 2016-06-22: 100 ug via INTRAVENOUS
  Administered 2016-06-22: 50 ug via INTRAVENOUS
  Administered 2016-06-22 (×2): 100 ug via INTRAVENOUS

## 2016-06-22 MED ORDER — FENTANYL CITRATE (PF) 100 MCG/2ML IJ SOLN
25.0000 ug | INTRAMUSCULAR | Status: DC | PRN
  Administered 2016-06-22: 25 ug via INTRAVENOUS

## 2016-06-22 MED ORDER — DEXTROSE 5 % IV SOLN
3.0000 g | INTRAVENOUS | Status: DC
  Filled 2016-06-22: qty 3000

## 2016-06-22 MED ORDER — CEFAZOLIN SODIUM-DEXTROSE 2-4 GM/100ML-% IV SOLN
INTRAVENOUS | Status: AC
  Administered 2016-06-22: 2000 mg
  Filled 2016-06-22: qty 100

## 2016-06-22 MED ORDER — SODIUM CHLORIDE FLUSH 0.9 % IV SOLN
INTRAVENOUS | Status: AC
  Filled 2016-06-22: qty 10

## 2016-06-22 MED ORDER — SODIUM CHLORIDE 0.9 % IV SOLN
INTRAVENOUS | Status: DC | PRN
  Administered 2016-06-22: 100 ug/min via INTRAVENOUS

## 2016-06-22 MED ORDER — IBUPROFEN 600 MG PO TABS
600.0000 mg | ORAL_TABLET | Freq: Four times a day (QID) | ORAL | Status: DC
  Administered 2016-06-22 – 2016-06-25 (×11): 600 mg via ORAL
  Filled 2016-06-22 (×11): qty 1

## 2016-06-22 MED ORDER — LIDOCAINE 2% (20 MG/ML) 5 ML SYRINGE
INTRAMUSCULAR | Status: DC | PRN
  Administered 2016-06-22 (×4): 100 mg via INTRAVENOUS

## 2016-06-22 MED ORDER — SIMETHICONE 80 MG PO CHEW: 80.0000 mg | CHEWABLE_TABLET | Freq: Three times a day (TID) | ORAL | Status: DC

## 2016-06-22 MED ORDER — BUPIVACAINE HCL (PF) 0.5 % IJ SOLN
INTRAMUSCULAR | Status: AC
  Filled 2016-06-22: qty 30

## 2016-06-22 MED ORDER — SIMETHICONE 80 MG PO CHEW
80.0000 mg | CHEWABLE_TABLET | ORAL | Status: DC | PRN
  Administered 2016-06-23 – 2016-06-24 (×2): 80 mg via ORAL
  Filled 2016-06-22 (×2): qty 1

## 2016-06-22 MED ORDER — SUCCINYLCHOLINE CHLORIDE 20 MG/ML IJ SOLN
INTRAMUSCULAR | Status: DC | PRN
  Administered 2016-06-22: 120 mg via INTRAVENOUS

## 2016-06-22 MED ORDER — DEXTROSE 5 % IV SOLN
500.0000 mg | Freq: Once | INTRAVENOUS | Status: AC
  Administered 2016-06-22: 500 mg via INTRAVENOUS
  Filled 2016-06-22: qty 500

## 2016-06-22 MED ORDER — SODIUM CHLORIDE 0.9% FLUSH: 9.0000 mL | INTRAVENOUS | Status: DC | PRN

## 2016-06-22 MED ORDER — SENNOSIDES-DOCUSATE SODIUM 8.6-50 MG PO TABS
2.0000 | ORAL_TABLET | ORAL | Status: DC
  Administered 2016-06-24: 2 via ORAL
  Filled 2016-06-22 (×2): qty 2

## 2016-06-22 MED ORDER — DIBUCAINE 1 % RE OINT
1.0000 | TOPICAL_OINTMENT | RECTAL | Status: DC | PRN
Start: 2016-06-22 — End: 2016-06-25

## 2016-06-22 MED ORDER — PRENATAL MULTIVITAMIN CH
1.0000 | ORAL_TABLET | Freq: Every day | ORAL | Status: DC
  Administered 2016-06-24 – 2016-06-25 (×2): 1 via ORAL
  Filled 2016-06-22 (×2): qty 1

## 2016-06-22 MED ORDER — SIMETHICONE 80 MG PO CHEW: 80.0000 mg | CHEWABLE_TABLET | ORAL | Status: DC

## 2016-06-22 MED ORDER — OXYCODONE HCL 5 MG/5ML PO SOLN: 5.0000 mg | Freq: Once | ORAL | Status: DC | PRN

## 2016-06-22 MED ORDER — LACTATED RINGERS IV SOLN
INTRAVENOUS | Status: DC
  Administered 2016-06-23 – 2016-06-24 (×2): via INTRAVENOUS

## 2016-06-22 MED ORDER — OXYTOCIN 40 UNITS IN LACTATED RINGERS INFUSION - SIMPLE MED
INTRAVENOUS | Status: AC
  Filled 2016-06-22: qty 1000

## 2016-06-22 MED ORDER — ONDANSETRON HCL 4 MG/2ML IJ SOLN: 4.0000 mg | Freq: Four times a day (QID) | INTRAMUSCULAR | Status: DC | PRN

## 2016-06-22 MED ORDER — DIPHENHYDRAMINE HCL 50 MG/ML IJ SOLN: 12.5000 mg | Freq: Four times a day (QID) | INTRAMUSCULAR | Status: DC | PRN

## 2016-06-22 MED ORDER — OXYCODONE HCL 5 MG PO TABS: 5.0000 mg | ORAL_TABLET | Freq: Once | ORAL | Status: DC | PRN

## 2016-06-22 MED ORDER — BUPIVACAINE HCL (PF) 0.5 % IJ SOLN
INTRAMUSCULAR | Status: DC
Start: 2016-06-22 — End: 2016-06-22
  Filled 2016-06-22: qty 30

## 2016-06-22 MED ORDER — PHENYLEPHRINE HCL 10 MG/ML IJ SOLN
INTRAMUSCULAR | Status: DC | PRN
  Administered 2016-06-22 (×6): 200 ug via INTRAVENOUS

## 2016-06-22 MED ORDER — DIPHENHYDRAMINE HCL 12.5 MG/5ML PO ELIX
12.5000 mg | ORAL_SOLUTION | Freq: Four times a day (QID) | ORAL | Status: DC | PRN
  Filled 2016-06-22: qty 5

## 2016-06-22 MED ORDER — PROPOFOL 10 MG/ML IV BOLUS
INTRAVENOUS | Status: DC | PRN
  Administered 2016-06-22: 100 mg via INTRAVENOUS

## 2016-06-22 MED ORDER — COCONUT OIL OIL: 1.0000 "application " | TOPICAL_OIL | Status: DC | PRN

## 2016-06-22 MED ORDER — NALOXONE HCL 0.4 MG/ML IJ SOLN: 0.4000 mg | INTRAMUSCULAR | Status: DC | PRN

## 2016-06-22 MED ORDER — SOD CITRATE-CITRIC ACID 500-334 MG/5ML PO SOLN
ORAL | Status: AC
  Administered 2016-06-22: 30 mL
  Filled 2016-06-22: qty 15

## 2016-06-22 MED ORDER — FENTANYL CITRATE (PF) 100 MCG/2ML IJ SOLN
INTRAMUSCULAR | Status: AC
  Filled 2016-06-22: qty 2

## 2016-06-22 MED ORDER — MIDAZOLAM HCL 2 MG/2ML IJ SOLN
INTRAMUSCULAR | Status: DC | PRN
  Administered 2016-06-22: 2 mg via INTRAVENOUS

## 2016-06-22 MED ORDER — BUPIVACAINE HCL (PF) 0.5 % IJ SOLN
INTRAMUSCULAR | Status: DC | PRN
  Administered 2016-06-22: 13 mL

## 2016-06-22 MED ORDER — SOD CITRATE-CITRIC ACID 500-334 MG/5ML PO SOLN: 30.0000 mL | ORAL | Status: DC

## 2016-06-22 MED ORDER — MENTHOL 3 MG MT LOZG
1.0000 | LOZENGE | OROMUCOSAL | Status: DC | PRN
  Filled 2016-06-22: qty 9

## 2016-06-22 MED ORDER — FENTANYL 40 MCG/ML IV SOLN: INTRAVENOUS | Status: DC

## 2016-06-22 MED ORDER — BUPIVACAINE 0.25 % ON-Q PUMP DUAL CATH 400 ML
INJECTION | Status: AC
  Filled 2016-06-22: qty 400

## 2016-06-22 MED ORDER — DEXAMETHASONE SODIUM PHOSPHATE 10 MG/ML IJ SOLN
INTRAMUSCULAR | Status: DC | PRN
  Administered 2016-06-22: 10 mg via INTRAVENOUS

## 2016-06-22 MED ORDER — OXYTOCIN 40 UNITS IN LACTATED RINGERS INFUSION - SIMPLE MED
2.5000 [IU]/h | INTRAVENOUS | Status: AC
  Administered 2016-06-22: 2.5 [IU]/h via INTRAVENOUS
  Filled 2016-06-22: qty 1000

## 2016-06-22 MED ORDER — SODIUM CHLORIDE 0.9 % IV SOLN
1.0000 g | INTRAVENOUS | Status: DC
  Administered 2016-06-22 (×2): 1 g via INTRAVENOUS
  Filled 2016-06-22 (×7): qty 1000

## 2016-06-22 MED ORDER — WITCH HAZEL-GLYCERIN EX PADS
1.0000 | MEDICATED_PAD | CUTANEOUS | Status: DC | PRN
Start: 2016-06-22 — End: 2016-06-25

## 2016-06-22 SURGICAL SUPPLY — 44 items
BAG COUNTER SPONGE EZ (MISCELLANEOUS) ×3 IMPLANT
BAG SPNG 4X4 CLR HAZ (MISCELLANEOUS) ×2
CANISTER SUCT 3000ML (MISCELLANEOUS) ×3 IMPLANT
CATH KIT ON-Q SILVERSOAK 5 (CATHETERS) ×2 IMPLANT
CATH KIT ON-Q SILVERSOAK 5IN (CATHETERS) ×6 IMPLANT
CHLORAPREP W/TINT 26ML (MISCELLANEOUS) ×6 IMPLANT
CLOSURE WOUND 1/2 X4 (GAUZE/BANDAGES/DRESSINGS)
COUNTER SPONGE BAG EZ (MISCELLANEOUS) ×2
DRSG TELFA 3X8 NADH (GAUZE/BANDAGES/DRESSINGS) IMPLANT
ELECT CAUTERY BLADE 6.4 (BLADE) ×3 IMPLANT
ELECT REM PT RETURN 9FT ADLT (ELECTROSURGICAL) ×3
ELECTRODE REM PT RTRN 9FT ADLT (ELECTROSURGICAL) ×1 IMPLANT
GAUZE SPONGE 4X4 12PLY STRL (GAUZE/BANDAGES/DRESSINGS) ×3 IMPLANT
GLOVE BIO SURGEON STRL SZ7 (GLOVE) ×1 IMPLANT
GLOVE BIOGEL PI IND STRL 7.5 (GLOVE) IMPLANT
GLOVE BIOGEL PI INDICATOR 7.5 (GLOVE) ×2
GLOVE INDICATOR 7.5 STRL GRN (GLOVE) ×1 IMPLANT
GLOVE SKINSENSE NS SZ7.0 (GLOVE) ×4
GLOVE SKINSENSE NS SZ8.0 LF (GLOVE) ×2
GLOVE SKINSENSE STRL SZ7.0 (GLOVE) IMPLANT
GLOVE SKINSENSE STRL SZ8.0 LF (GLOVE) IMPLANT
GOWN STRL REUS W/ TWL LRG LVL3 (GOWN DISPOSABLE) ×3 IMPLANT
GOWN STRL REUS W/TWL LRG LVL3 (GOWN DISPOSABLE) ×9
KIT PREVENA INCISION MGT 13 (CANNISTER) ×2 IMPLANT
LIQUID BAND (GAUZE/BANDAGES/DRESSINGS) ×1 IMPLANT
NS IRRIG 1000ML POUR BTL (IV SOLUTION) ×3 IMPLANT
PACK C SECTION AR (MISCELLANEOUS) ×3 IMPLANT
PAD DRESSING TELFA 3X8 NADH (GAUZE/BANDAGES/DRESSINGS) ×1 IMPLANT
PAD OB MATERNITY 4.3X12.25 (PERSONAL CARE ITEMS) ×3 IMPLANT
PAD PREP 24X41 OB/GYN DISP (PERSONAL CARE ITEMS) ×3 IMPLANT
RETRACTOR TRAXI PANNICULUS (MISCELLANEOUS) IMPLANT
RTRCTR C-SECT PINK 34CM XLRG (MISCELLANEOUS) ×2 IMPLANT
SOL PREP PVP 2OZ (MISCELLANEOUS) ×3
SOLUTION PREP PVP 2OZ (MISCELLANEOUS) IMPLANT
SPONGE LAP 18X18 5 PK (GAUZE/BANDAGES/DRESSINGS) ×4 IMPLANT
STRAP SAFETY BODY (MISCELLANEOUS) ×2 IMPLANT
STRIP CLOSURE SKIN 1/2X4 (GAUZE/BANDAGES/DRESSINGS) ×1 IMPLANT
SUT MNCRL AB 4-0 PS2 18 (SUTURE) ×3 IMPLANT
SUT PDS AB 1 TP1 96 (SUTURE) ×6 IMPLANT
SUT VIC AB 0 CTX 36 (SUTURE) ×6
SUT VIC AB 0 CTX36XBRD ANBCTRL (SUTURE) ×2 IMPLANT
SUT VIC AB 2-0 CT1 36 (SUTURE) ×3 IMPLANT
TOWEL OR 17X26 4PK STRL BLUE (TOWEL DISPOSABLE) ×2 IMPLANT
TRAXI PANNICULUS RETRACTOR (MISCELLANEOUS) ×2

## 2016-06-22 NOTE — Progress Notes (Signed)
  Labor Progress Note   25 y.o. [id number] @ 7666w3d , admitted for  Pregnancy, Labor Management. IOL for Obesity, T2DM on Metformin  Subjective:  Pt is comfortable with epidural. She has been sitting up in the bed. Pt and support people are wondering what the plan will be for delivery.   Objective:  BP (!) 107/54   Pulse 69   Temp 98.5 F (36.9 C) (Oral)   Resp (!) 22   LMP 09/13/2015   SpO2 98%  Abd: mild Extr: 1+ bilateral pedal edema SVE: CERVIX: 6.5 cm dilated, 100 effaced, 0 station  EFM: FHR: 135 bpm, variability: moderate,  accelerations:  Present,  decelerations:  Absent Toco: Frequency: Every 2-3 minutes Labs: I have reviewed the patient's lab results.   Assessment & Plan:  G4P0030 @ 5466w3d, admitted for  Pregnancy and Labor/Delivery Management  1. Pain management: epidural. 2. FWB: FHT category I.  3. ID: GBS positive/Ampicillin ppx 4. Labor management: Continue Pitocin IOL as pt is progressing All discussed with patient, see orders   Elowyn Raupp, CNM

## 2016-06-22 NOTE — Progress Notes (Signed)
Subjective:  Doing well, comfortable.  Objective:   Vitals: Blood pressure 114/67, pulse 89, temperature 98.4 F (36.9 C), temperature source Axillary, resp. rate (!) 22, height 5' (1.524 m), weight 250 lb (113.4 kg), last menstrual period 09/13/2015, SpO2 98 %. General: NAD Abdomen: gravid, non-tender Cervical Exam:  Dilation: 7 Effacement (%): 100 Cervical Position: Anterior Station: 0 Presentation: Vertex Exam by:: Tresea MallJane Gledhill, CNM  FHT: 130, moderate variability, +accels, no decels Toco: none  Results for orders placed or performed during the hospital encounter of 06/19/16 (from the past 24 hour(s))  Platelet count     Status: None   Collection Time: 06/21/16  6:19 PM  Result Value Ref Range   Platelets 299 150 - 440 K/uL  Glucose, capillary     Status: None   Collection Time: 06/21/16  7:35 PM  Result Value Ref Range   Glucose-Capillary 79 65 - 99 mg/dL  Glucose, capillary     Status: None   Collection Time: 06/21/16 11:51 PM  Result Value Ref Range   Glucose-Capillary 77 65 - 99 mg/dL  Glucose, capillary     Status: Abnormal   Collection Time: 06/22/16  4:22 AM  Result Value Ref Range   Glucose-Capillary 106 (H) 65 - 99 mg/dL  Glucose, capillary     Status: None   Collection Time: 06/22/16  7:55 AM  Result Value Ref Range   Glucose-Capillary 78 65 - 99 mg/dL  Basic metabolic panel     Status: Abnormal   Collection Time: 06/22/16 10:16 AM  Result Value Ref Range   Sodium 134 (L) 135 - 145 mmol/L   Potassium 3.7 3.5 - 5.1 mmol/L   Chloride 105 101 - 111 mmol/L   CO2 20 (L) 22 - 32 mmol/L   Glucose, Bld 102 (H) 65 - 99 mg/dL   BUN 8 6 - 20 mg/dL   Creatinine, Ser 0.980.69 0.44 - 1.00 mg/dL   Calcium 8.3 (L) 8.9 - 10.3 mg/dL   GFR calc non Af Amer >60 >60 mL/min   GFR calc Af Amer >60 >60 mL/min   Anion gap 9 5 - 15    Assessment:   25 y.o. G4P0030 4789w3d   Plan:   1) Labor - rechecked prior to C-section and unchanged 7/C/0  - contractions have completely  ceased with cessation of pitocin  2) Fetus - cat I tracing

## 2016-06-22 NOTE — Progress Notes (Signed)
Dr Jean RosenthalJackson given update, states he will defer v/e until pt has been at 40 mu x2 hours, and to continue to observe urinary output

## 2016-06-22 NOTE — Op Note (Signed)
Preoperative Diagnosis: 1) 25 y.o. G4P1030 at [redacted]w[redacted]d 2) Type 2 Diabetes Mellitus 3) Morbid Obesity BMI 52 4) Active phase arrest  Postoperative Diagnosis: 1) 25 y.o. B1Y7829 at [redacted]w[redacted]d 2) Type 2 Diabetes Mellitus 3) Morbid Obesity BMI 52 4) Active phase arrest  Operation Performed: Primary low transverse C-section via pfannenstiel skin incision  Indication: Active phase arrest  Anesthesia: Epidural converted to general anesthesia  Primary Surgeon: Vena Austria, MD  Assistant: Annamarie Major, MD  Preoperative Antibiotics: 3g Ancef, 500mg  azithromycin  Estimated Blood Loss:  IV Fluids:  Urine Output:: 50mL  Drains or Tubes: Foley to gravity drainage, ON-Q catheter system, Provena wound vac  Implants: none  Specimens Removed: none  Complications: none  Intraoperative Findings:  Normal tubes ovaries and uterus.  Delivery resulted in the birth of a liveborn female, APGAR (1 MIN): 8   APGAR (5 MINS): 9, weight pending  Patient Condition: stable  Procedure in Detail:  Patient was taken to the operating room were she was administered regional anesthesia.  She was positioned in the supine position, prepped and draped in the  Usual sterile fashion.  A traxis paniculectomy retractor was placed to allow visualization of the mons.  Prior to proceeding with the case a time out was performed and the level of anesthetic was checked and noted to be adequate.  Utilizing the scalpel a pfannenstiel skin incision was made 2cm above the pubic symphysis and carried down sharply to the the level of the rectus fascia.  The fascia was incised in the midline using the scalpel and then extended using mayo scissors.  The superior border of the rectus fascia was grasped with two Kocher clamps and the underlying rectus muscles were dissected of the fascia using blunt dissection.  The median raphae was incised using Mayo scissors.   The inferior border of the rectus fascia was dissected of the  rectus muscles in a similar fashion.  The midline was identified, the peritoneum was entered bluntly and expanded using manual tractions.  The uterus was noted to be in a none rotated position. A large Alexis retractor was placed to allow visualization of the lower uterine segment.  A bladder flap was not created.  A low transverse incision was scored on the lower uterine segment.  The hysterotomy was entered bluntly using the operators finger.  The hysterotomy incision was extended using manual traction.  The operators hand was placed within the hysterotomy position noting the fetus to be within the OA position.  The vertex was grasped, flexed, brought to the incision, and delivered using a flat kiwi and fundal pressure.  The remainder of the body delivered with ease.  The infant was suctioned, cord was clamped and cut before handing off to the awaiting neonatologist.  The placenta was delivered using manual extraction.  The uterus was, wiped clean of clots and debris using two moist laps.  At this point the patient reported significant pain and decision was made to convert to general anesthesia. The hysterotomy was closed using a two layer closure of 0 Vicryl, with the first being a running locked, the second a vertical imbricating.  The peritoneal gutters were wiped clean of clots and debris using two moist laps.  The hysterotomy incision was re-inspected noted to be hemostatic.  The rectus muscles were re-approximated in the midline using a single 2-0 Vicryl mattress stitch.  The rectus muscles were inspected noted to be hemostatic.  The superior border of the rectus fascia was grasped with a  Kocher clamp.  The ON-Q trocars were then placed 4cm above the superior border of the incision and tunneled subfascially.  The introducers were removed and the catheters were threaded through the sleeves after which the sleeves were removed.  The fascia was closed using a looped #1 PDS in a running fashion taking 1cm by  1cm bites.  The subcutaneous tissue was irrigated using warm saline, hemostasis achieved using the bovie.  The subcutaneous dead space was greater than 3cm and was closed.  The subcutaneous dead space was obliterated by using a 53-T 0 Chromic in a running fashion.  The skinn was closed using staples  A provena wound vac was applied.  Sponge needle and instrument counts were corrects times two.  The patient tolerated the procedure well and was taken to the recovery room in stable condition.

## 2016-06-22 NOTE — Progress Notes (Signed)
In error

## 2016-06-22 NOTE — Progress Notes (Signed)
Subjective:  Comfortable   Objective:   Vitals: Blood pressure 106/63, pulse 77, temperature 98.6 F (37 C), temperature source Axillary, resp. rate (!) 22, height 5' (1.524 m), weight 250 lb (113.4 kg), last menstrual period 09/13/2015, SpO2 98 %. General:  Abdomen: Cervical Exam:  Dilation: 8 Effacement (%): 100 Cervical Position: Anterior Station: 0 Presentation: Vertex Exam by:: Tresea MallJane Gledhill, CNM   Patient has changed from 6.5cm to 7.5cm over 5.5hrs with different examiners. No descent in station and prolonged induction GDM.  FHT: 120 , moderate variability, +Accels, no decels Toco: q2-913min  Results for orders placed or performed during the hospital encounter of 06/19/16 (from the past 24 hour(s))  Glucose, capillary     Status: None   Collection Time: 06/21/16  4:46 PM  Result Value Ref Range   Glucose-Capillary 92 65 - 99 mg/dL  Platelet count     Status: None   Collection Time: 06/21/16  6:19 PM  Result Value Ref Range   Platelets 299 150 - 440 K/uL  Glucose, capillary     Status: None   Collection Time: 06/21/16  7:35 PM  Result Value Ref Range   Glucose-Capillary 79 65 - 99 mg/dL  Glucose, capillary     Status: None   Collection Time: 06/21/16 11:51 PM  Result Value Ref Range   Glucose-Capillary 77 65 - 99 mg/dL  Glucose, capillary     Status: Abnormal   Collection Time: 06/22/16  4:22 AM  Result Value Ref Range   Glucose-Capillary 106 (H) 65 - 99 mg/dL  Glucose, capillary     Status: None   Collection Time: 06/22/16  7:55 AM  Result Value Ref Range   Glucose-Capillary 78 65 - 99 mg/dL  Basic metabolic panel     Status: Abnormal   Collection Time: 06/22/16 10:16 AM  Result Value Ref Range   Sodium 134 (L) 135 - 145 mmol/L   Potassium 3.7 3.5 - 5.1 mmol/L   Chloride 105 101 - 111 mmol/L   CO2 20 (L) 22 - 32 mmol/L   Glucose, Bld 102 (H) 65 - 99 mg/dL   BUN 8 6 - 20 mg/dL   Creatinine, Ser 1.610.69 0.44 - 1.00 mg/dL   Calcium 8.3 (L) 8.9 - 10.3 mg/dL   GFR calc non Af Amer >60 >60 mL/min   GFR calc Af Amer >60 >60 mL/min   Anion gap 9 5 - 15    Assessment:   25 y.o. G4P0030 6016w3d failed IOL for GDM, morbid obesity, postdates  Plan:   1) Labor - prolonged induction with arrest in active phase.  Pitocin has been as high as 40 at one point.  DIscontinue pitocin for 1-2 hrs to decrease risk of PPH.  Had discussed if failure to make adequate change will proceed with Cesarean section and patient in agreement  2) Fetus - cat I tracing.

## 2016-06-22 NOTE — Progress Notes (Signed)
  Labor Progress Note   25 y.o. [id number] @ 6847w3d , admitted for  Pregnancy, Labor Management. IOL for obesity, T2DM on Metformin  Subjective:  Pt is sitting up in bed. She is comfortable with her epidural.   Objective:  BP 106/63   Pulse 77   Temp 98.6 F (37 C) (Axillary)   Resp (!) 22   Ht 5' (1.524 m)   Wt 250 lb (113.4 kg)   LMP 09/13/2015   SpO2 98%   BMI 48.82 kg/m  Abd: mild Extr: 1+ bilateral pedal edema SVE: CERVIX: 7.5 cm dilated, 100 effaced, 0 station  EFM: FHR: 130 bpm, variability: moderate,  accelerations:  Present,  decelerations:  Absent Toco: Frequency: Every 2-4 minutes Labs: I have reviewed the patient's lab results.   Assessment & Plan:  G4P0030 @ 4047w3d, admitted for  Pregnancy and Labor/Delivery Management  1. Pain management: epidural. 2. FWB: FHT category I.  3. ID: GBS positive 4. Labor management: given the minimal change in dilation and no change in station and the risk for postpartum hemorrhage due to the length of induction and amount of pitocin; the pitocin is being turned off and plan for a c/section in approximately 1 hour.  All discussed with patient who has verbalized understanding   Sircharles Holzheimer, CNM

## 2016-06-22 NOTE — Anesthesia Procedure Notes (Signed)
Procedures

## 2016-06-22 NOTE — Transfer of Care (Signed)
Immediate Anesthesia Transfer of Care Note  Patient: Faith [name]  Procedure(s) Performed: Procedure(s): CESAREAN SECTION (N/A)  Patient Location: PACU  Anesthesia Type:General  Level of Consciousness: awake, alert  and oriented  Airway & Oxygen Therapy: Patient connected to nasal cannula oxygen  Post-op Assessment: Post -op Vital signs reviewed and stable  Post vital signs: stable  Last Vitals:  Vitals:   06/22/16 1656 06/22/16 1833  BP: 113/64 129/63  Pulse: 86 90  Resp:    Temp:      Last Pain:  Vitals:   06/22/16 1530  TempSrc: Axillary  PainSc:       Patients Stated Pain Goal: 0 (06/22/16 0700)  Complications: No apparent anesthesia complications

## 2016-06-23 LAB — GLUCOSE, CAPILLARY
GLUCOSE-CAPILLARY: 80 mg/dL (ref 65–99)
GLUCOSE-CAPILLARY: 98 mg/dL (ref 65–99)

## 2016-06-23 LAB — ABO/RH: ABO/RH(D): O POS

## 2016-06-23 LAB — CBC
HEMATOCRIT: 19.5 % — AB (ref 35.0–47.0)
HEMATOCRIT: 22.6 % — AB (ref 35.0–47.0)
HEMOGLOBIN: 6.1 g/dL — AB (ref 12.0–16.0)
HEMOGLOBIN: 7.5 g/dL — AB (ref 12.0–16.0)
MCH: 20.8 pg — ABNORMAL LOW (ref 26.0–34.0)
MCH: 23.3 pg — AB (ref 26.0–34.0)
MCHC: 31.1 g/dL — ABNORMAL LOW (ref 32.0–36.0)
MCHC: 33.2 g/dL (ref 32.0–36.0)
MCV: 66.8 fL — ABNORMAL LOW (ref 80.0–100.0)
MCV: 70.2 fL — ABNORMAL LOW (ref 80.0–100.0)
Platelets: 260 10*3/uL (ref 150–440)
Platelets: 204 10*3/uL (ref 150–440)
RBC: 2.93 MIL/uL — AB (ref 3.80–5.20)
RBC: 3.21 MIL/uL — AB (ref 3.80–5.20)
RDW: 19.8 % — AB (ref 11.5–14.5)
RDW: 21.9 % — ABNORMAL HIGH (ref 11.5–14.5)
WBC: 17.4 10*3/uL — AB (ref 3.6–11.0)
WBC: 12.6 10*3/uL — ABNORMAL HIGH (ref 3.6–11.0)

## 2016-06-23 LAB — PREPARE RBC (CROSSMATCH)

## 2016-06-23 MED ORDER — ACETAMINOPHEN 325 MG PO TABS
650.0000 mg | ORAL_TABLET | Freq: Once | ORAL | Status: AC
  Administered 2016-06-23: 650 mg via ORAL
  Filled 2016-06-23: qty 2

## 2016-06-23 MED ORDER — SODIUM CHLORIDE 0.9 % IV SOLN: Freq: Once | INTRAVENOUS | Status: DC

## 2016-06-23 MED ORDER — DIPHENHYDRAMINE HCL 25 MG PO CAPS
25.0000 mg | ORAL_CAPSULE | Freq: Once | ORAL | Status: AC
  Administered 2016-06-23: 25 mg via ORAL
  Filled 2016-06-23: qty 1

## 2016-06-23 MED ORDER — ALPRAZOLAM 0.5 MG PO TABS
0.5000 mg | ORAL_TABLET | Freq: Two times a day (BID) | ORAL | Status: DC | PRN
  Administered 2016-06-24: 0.5 mg via ORAL
  Filled 2016-06-23: qty 1

## 2016-06-23 NOTE — Anesthesia Postprocedure Evaluation (Signed)
Anesthesia Post Note  Patient: Faith [name]  Procedure(s) Performed: Procedure(s) (LRB): CESAREAN SECTION (N/A)  Patient location during evaluation: Mother Baby Anesthesia Type: General Level of consciousness: awake and alert Pain management: pain level controlled Vital Signs Assessment: post-procedure vital signs reviewed and stable Respiratory status: spontaneous breathing Cardiovascular status: blood pressure returned to baseline Postop Assessment: no headache, no backache, patient able to bend at knees, no signs of nausea or vomiting and adequate PO intake Anesthetic complications: no    Last Vitals:  Vitals:   06/23/16 0423 06/23/16 0713  BP: (!) 112/48 (!) 100/54  Pulse: 81 77  Resp: 20 18  Temp: 37.2 C 36.9 C    Last Pain:  Vitals:   06/23/16 0713  TempSrc: Oral  PainSc:                  Starling Mannsurtis,  Rastus Borton A

## 2016-06-23 NOTE — Progress Notes (Signed)
POD #1 (14 hours postop) CS for FTP/ IOL for DM at term. BMI>50 Subjective:   Bottle feeding. Taking liquids without nausea. Has not had breakfast this Am.  Objective:  Blood pressure (!) 100/54, pulse 77, temperature 98.4 F (36.9 C), temperature source Oral, resp. rate 18, height 5' (1.524 m), weight 113.4 kg (250 lb), last menstrual period 09/13/2015, SpO2 96 %, unknown if currently breastfeeding.  Urine output 1125 ml  General: NAD, appears comfortable, pale Heart: RRR without murmur Pulmonary: no increased work of breathing/ CTAB Abdomen: non-distended,  obese Incision: wound vac intact Lochia: appropriate Extremities:  no erythema, no tenderness, +1 pitting edema  Results for orders placed or performed during the hospital encounter of 06/19/16 (from the past 72 hour(s))  Glucose, capillary     Status: Abnormal   Collection Time: 06/20/16 10:07 AM  Result Value Ref Range   Glucose-Capillary 105 (H) 65 - 99 mg/dL  Glucose, capillary     Status: None   Collection Time: 06/20/16  2:06 PM  Result Value Ref Range   Glucose-Capillary 82 65 - 99 mg/dL  Glucose, capillary     Status: None   Collection Time: 06/20/16  6:11 PM  Result Value Ref Range   Glucose-Capillary 90 65 - 99 mg/dL  Glucose, capillary     Status: None   Collection Time: 06/20/16 11:03 PM  Result Value Ref Range   Glucose-Capillary 89 65 - 99 mg/dL  Glucose, capillary     Status: None   Collection Time: 06/21/16  2:37 AM  Result Value Ref Range   Glucose-Capillary 82 65 - 99 mg/dL  Glucose, capillary     Status: None   Collection Time: 06/21/16  7:14 AM  Result Value Ref Range   Glucose-Capillary 79 65 - 99 mg/dL  Glucose, capillary     Status: None   Collection Time: 06/21/16 12:45 PM  Result Value Ref Range   Glucose-Capillary 77 65 - 99 mg/dL  Glucose, capillary     Status: None   Collection Time: 06/21/16  2:43 PM  Result Value Ref Range   Glucose-Capillary 88 65 - 99 mg/dL  Glucose, capillary      Status: None   Collection Time: 06/21/16  4:46 PM  Result Value Ref Range   Glucose-Capillary 92 65 - 99 mg/dL  Platelet count     Status: None   Collection Time: 06/21/16  6:19 PM  Result Value Ref Range   Platelets 299 150 - 440 K/uL  Glucose, capillary     Status: None   Collection Time: 06/21/16  7:35 PM  Result Value Ref Range   Glucose-Capillary 79 65 - 99 mg/dL  Glucose, capillary     Status: None   Collection Time: 06/21/16 11:51 PM  Result Value Ref Range   Glucose-Capillary 77 65 - 99 mg/dL  Glucose, capillary     Status: Abnormal   Collection Time: 06/22/16  4:22 AM  Result Value Ref Range   Glucose-Capillary 106 (H) 65 - 99 mg/dL  Glucose, capillary     Status: None   Collection Time: 06/22/16  7:55 AM  Result Value Ref Range   Glucose-Capillary 78 65 - 99 mg/dL  Basic metabolic panel     Status: Abnormal   Collection Time: 06/22/16 10:16 AM  Result Value Ref Range   Sodium 134 (L) 135 - 145 mmol/L   Potassium 3.7 3.5 - 5.1 mmol/L   Chloride 105 101 - 111 mmol/L   CO2 20 (L) 22 -  32 mmol/L   Glucose, Bld 102 (H) 65 - 99 mg/dL   BUN 8 6 - 20 mg/dL   Creatinine, Ser 0.69 0.44 - 1.00 mg/dL   Calcium 8.3 (L) 8.9 - 10.3 mg/dL   GFR calc non Af Amer >60 >60 mL/min   GFR calc Af Amer >60 >60 mL/min    Comment: (NOTE) The eGFR has been calculated using the CKD EPI equation. This calculation has not been validated in all clinical situations. eGFR's persistently <60 mL/min signify possible Chronic Kidney Disease.    Anion gap 9 5 - 15  Glucose, capillary     Status: None   Collection Time: 06/22/16 10:48 AM  Result Value Ref Range   Glucose-Capillary 90 65 - 99 mg/dL  Glucose, capillary     Status: None   Collection Time: 06/22/16  4:05 PM  Result Value Ref Range   Glucose-Capillary 87 65 - 99 mg/dL  CBC     Status: Abnormal   Collection Time: 06/23/16  5:38 AM  Result Value Ref Range   WBC 17.4 (H) 3.6 - 11.0 K/uL   RBC 2.93 (L) 3.80 - 5.20 MIL/uL    Hemoglobin 6.1 (L) 12.0 - 16.0 g/dL   HCT 19.5 (L) 35.0 - 47.0 %   MCV 66.8 (L) 80.0 - 100.0 fL   MCH 20.8 (L) 26.0 - 34.0 pg   MCHC 31.1 (L) 32.0 - 36.0 g/dL   RDW 19.8 (H) 11.5 - 14.5 %   Platelets 260 150 - 440 K/uL     Assessment:   25 y.o. [id number] postoperativeday # 1  Chronic anemia with worsening anemia due to blood loss of surgery   Transfuse 2 u PRBCs-discussed transfusion with patient and she agrees with POM   Good urine output and normal vs   Foely removal later tonight   Advance diet as tolerated to reg diabetic diet   Encourage cough and deep breathing   Ambulate tonight   Plan:   2) --/--/O POS (09/16 0153) / RI/ Varicella Immune  3) TDAP status ?  4) Bottle feeding  5) Disposition-probably POD 3 or 4  Javarian Jakubiak, CNM

## 2016-06-23 NOTE — Lactation Note (Signed)
This note was copied from a baby's chart. Lactation Consultation Note  Patient Name: Boy Clarise Cruzshley Beldin RUEAV'WToday's Date: 06/23/2016 Reason for consult: Initial assessment   Maternal Data    Feeding Feeding Type: Breast Fed Nipple Type: Slow - flow Length of feed: 25 min  LATCH Score/Interventions Latch: Repeated attempts needed to sustain latch, nipple held in mouth throughout feeding, stimulation needed to elicit sucking reflex. Intervention(s): Adjust position;Assist with latch;Breast massage  Audible Swallowing: A few with stimulation Intervention(s): Hand expression;Alternate breast massage  Type of Nipple: Everted at rest and after stimulation (rt nipple has boil and is inverted)  Comfort (Breast/Nipple): Soft / non-tender     Hold (Positioning): Full assist, staff holds infant at breast Intervention(s): Breastfeeding basics reviewed;Support Pillows;Position options  LATCH Score: 6  Lactation Tools Discussed/Used     Consult Status Consult Status: Follow-up   Burnadette PeterJaniya M Rolan Wrightsman 06/23/2016, 10:19 AM

## 2016-06-24 LAB — TYPE AND SCREEN
ABO/RH(D): O POS
Antibody Screen: NEGATIVE
UNIT DIVISION: 0
UNIT DIVISION: 0

## 2016-06-24 LAB — GLUCOSE, CAPILLARY
GLUCOSE-CAPILLARY: 90 mg/dL (ref 65–99)
Glucose-Capillary: 106 mg/dL — ABNORMAL HIGH (ref 65–99)

## 2016-06-24 LAB — CBC
HEMATOCRIT: 22.2 % — AB (ref 35.0–47.0)
HEMOGLOBIN: 7.3 g/dL — AB (ref 12.0–16.0)
MCH: 23.1 pg — AB (ref 26.0–34.0)
MCHC: 33 g/dL (ref 32.0–36.0)
MCV: 70 fL — AB (ref 80.0–100.0)
PLATELETS: 212 10*3/uL (ref 150–440)
RBC: 3.17 MIL/uL — AB (ref 3.80–5.20)
RDW: 22.1 % — ABNORMAL HIGH (ref 11.5–14.5)
WBC: 11.8 10*3/uL — AB (ref 3.6–11.0)

## 2016-06-24 MED ORDER — OXYCODONE-ACETAMINOPHEN 5-325 MG PO TABS
1.0000 | ORAL_TABLET | ORAL | Status: DC | PRN
  Administered 2016-06-24 (×2): 1 via ORAL
  Filled 2016-06-24 (×2): qty 1

## 2016-06-24 MED ORDER — AMMONIA AROMATIC IN INHA
RESPIRATORY_TRACT | Status: AC
  Filled 2016-06-24: qty 10
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
## 2016-06-24 NOTE — Progress Notes (Signed)
Admit Date: 06/19/2016 Today's Date: 06/24/2016  Subjective: Postpartum Day 2: Cesarean Delivery Patient reports incisional pain, tolerating PO, + flatus and no problems voiding.    Objective: Vital signs in last 24 hours: Temp:  [97.9 F (36.6 C)-98.6 F (37 C)] 98.4 F (36.9 C) (09/20 1111) Pulse Rate:  [80-90] 90 (09/20 1111) Resp:  [18-20] 18 (09/20 1111) BP: (96-120)/(48-69) 110/60 (09/20 1111) SpO2:  [95 %-99 %] 97 % (09/20 0708)  Physical Exam:  General: alert, cooperative and no distress Lochia: appropriate Uterine Fundus: firm Incision: Preveena dressing intact along with On Q DVT Evaluation: No evidence of DVT seen on physical exam.   Recent Labs  06/23/16 2214 06/24/16 0855  HGB 7.5* 7.3*  HCT 22.6* 22.2*    Assessment/Plan: Status post Cesarean section. Doing well postoperatively.  Continue current care. Anemia stable s/p 2 U pRBCs, no further transfusion Diet & ambulation Percocet for pain, monitor as has multiple allergies Discontinue accuchecks; check for DM postpartum visit Breast and bottle feeding  Faith [name] 06/24/2016, 11:31 AM

## 2016-06-25 MED ORDER — IBUPROFEN 600 MG PO TABS: 600.0000 mg | ORAL_TABLET | Freq: Four times a day (QID) | ORAL | 0 refills | Status: DC

## 2016-06-25 MED ORDER — FERROUS SULFATE 325 (65 FE) MG PO TABS: 325.0000 mg | ORAL_TABLET | Freq: Two times a day (BID) | ORAL | 1 refills | Status: DC

## 2016-06-25 MED ORDER — OXYCODONE-ACETAMINOPHEN 5-325 MG PO TABS: 1.0000 | ORAL_TABLET | ORAL | 0 refills | Status: DC | PRN

## 2016-06-25 NOTE — Discharge Summary (Signed)
Obstetric Discharge Summary Reason for Admission: induction of labor Prenatal Procedures: NST Intrapartum Procedures: cesarean: low cervical, transverse Postpartum Procedures: none and transfusion of pRBC Complications-Operative and Postpartum: none Hemoglobin  Date Value Ref Range Status  06/24/2016 7.3 (L) 12.0 - 16.0 g/dL Final   HGB  Date Value Ref Range Status  08/05/2014 13.0 12.0 - 16.0 g/dL Final   HCT  Date Value Ref Range Status  06/24/2016 22.2 (L) 35.0 - 47.0 % Final  08/05/2014 40.5 35.0 - 47.0 % Final    Physical Exam:  General: alert, appears stated age and no distress Lochia: appropriate Uterine Fundus: firm Incision: healing well DVT Evaluation: No evidence of DVT seen on physical exam.  Discharge Diagnoses: Term Pregnancy-delivered  Discharge Information: Date: 06/25/2016 Activity: pelvic rest Diet: routine   Medication List    STOP taking these medications   PRENATAL VITAMINS PO     TAKE these medications   albuterol 108 (90 Base) MCG/ACT inhaler Commonly known as:  PROVENTIL HFA;VENTOLIN HFA Inhale 2 puffs into the lungs every 6 (six) hours as needed. For shortness of breath   blood glucose meter kit and supplies Kit Dispense based on patient and insurance preference. Use up to four times daily as directed. (FOR ICD-9 250.00, 250.01).   ferrous sulfate 325 (65 FE) MG tablet Commonly known as:  FERROUSUL Take 1 tablet (325 mg total) by mouth 2 (two) times daily.   folic acid 1 MG tablet Commonly known as:  FOLVITE Take 3 mg by mouth daily.   ibuprofen 600 MG tablet Commonly known as:  ADVIL,MOTRIN Take 1 tablet (600 mg total) by mouth every 6 (six) hours.   Lacosamide 100 MG Tabs Take 1 tablet by mouth 2 (two) times daily. Reported on 01/20/2016   metFORMIN 500 MG tablet Commonly known as:  GLUCOPHAGE Take 1,500 mg by mouth daily with breakfast.   oxyCODONE-acetaminophen 5-325 MG tablet Commonly known as:  PERCOCET/ROXICET Take 1-2  tablets by mouth every 3 (three) hours as needed for moderate pain.   ranitidine 150 MG tablet Commonly known as:  ZANTAC Take 1 tablet (150 mg total) by mouth at bedtime.      Condition: stable Discharge to: home   Newborn Data: Live born female  Birth Weight: 8 lb 7.1 oz (3830 g) APGAR: 8, 9  Home with mother.  ,  M 06/25/2016, 9:08 AM 

## 2016-06-25 NOTE — Discharge Instructions (Signed)
Call your doctor for increased pain or vaginal bleeding, temperature above 100.4, depression, or concerns.  No strenuous activity or heavy lifting for 6 weeks.  No intercourse, tampons, douching, or enemas for 6 weeks.  No tub baths-showers only.  No driving for 2 weeks or while taking pain medications.  Continue prenatal vitamin and iron.  Keep incision clean and dry.  Call your doctor for incision concerns including redness, swelling, bleeding or drainage, or if begins to come apart.  Increase calories and fluids while breastfeeding. °

## 2016-06-25 NOTE — Progress Notes (Signed)
Education provided on need for Influenza vaccine.  Pt declines Influenza vaccine at this time.  Pt states that she received TDaP vaccine during pregnancy.  Reynold BowenSusan Paisley Kelse Ploch, RN 06/25/2016 9:29 AM

## 2016-06-25 NOTE — Progress Notes (Signed)
Discharge instructions provided.  Pt and sig other verbalize understanding of all instructions and follow-up care.  Prescriptions given.  Pt discharged to home with infant at 1026 on 06/25/16 via wheelchair by volunteer. Reynold BowenSusan Paisley Lonzell Dorris, RN 06/25/2016 11:01 AM

## 2016-06-26 ENCOUNTER — Encounter: Payer: Self-pay | Admitting: Obstetrics and Gynecology

## 2016-07-18 ENCOUNTER — Emergency Department
Admission: EM | Admit: 2016-07-18 | Discharge: 2016-07-18 | Discharge status: Against Medical Advice | Payer: Medicaid Other | Attending: Emergency Medicine | Admitting: Emergency Medicine

## 2016-07-18 ENCOUNTER — Emergency Department: Payer: Medicaid Other

## 2016-07-18 DIAGNOSIS — Z91040 Latex allergy status: Secondary | ICD-10-CM | POA: Insufficient documentation

## 2016-07-18 DIAGNOSIS — E119 Type 2 diabetes mellitus without complications: Secondary | ICD-10-CM | POA: Diagnosis not present

## 2016-07-18 DIAGNOSIS — J45909 Unspecified asthma, uncomplicated: Secondary | ICD-10-CM | POA: Insufficient documentation

## 2016-07-18 DIAGNOSIS — K8020 Calculus of gallbladder without cholecystitis without obstruction: Secondary | ICD-10-CM | POA: Diagnosis not present

## 2016-07-18 DIAGNOSIS — Z7984 Long term (current) use of oral hypoglycemic drugs: Secondary | ICD-10-CM | POA: Diagnosis not present

## 2016-07-18 DIAGNOSIS — R10A1 Flank pain, right side: Secondary | ICD-10-CM

## 2016-07-18 DIAGNOSIS — R1010 Upper abdominal pain, unspecified: Secondary | ICD-10-CM

## 2016-07-18 DIAGNOSIS — K8050 Calculus of bile duct without cholangitis or cholecystitis without obstruction: Secondary | ICD-10-CM

## 2016-07-18 DIAGNOSIS — F17210 Nicotine dependence, cigarettes, uncomplicated: Secondary | ICD-10-CM | POA: Diagnosis not present

## 2016-07-18 DIAGNOSIS — R109 Unspecified abdominal pain: Secondary | ICD-10-CM

## 2016-07-18 DIAGNOSIS — Z79899 Other long term (current) drug therapy: Secondary | ICD-10-CM | POA: Diagnosis not present

## 2016-07-18 DIAGNOSIS — R1011 Right upper quadrant pain: Secondary | ICD-10-CM

## 2016-07-18 DIAGNOSIS — R10A0 Flank pain, unspecified side: Secondary | ICD-10-CM

## 2016-07-18 LAB — CBC WITH DIFFERENTIAL/PLATELET
Basophils Absolute: 0.1 10*3/uL (ref 0–0.1)
Basophils Relative: 1 %
Eosinophils Absolute: 0.3 10*3/uL (ref 0–0.7)
Eosinophils Relative: 3 %
HCT: 35.5 % (ref 35.0–47.0)
HEMOGLOBIN: 11.1 g/dL — AB (ref 12.0–16.0)
LYMPHS ABS: 2.7 10*3/uL (ref 1.0–3.6)
LYMPHS PCT: 23 %
MCH: 21.9 pg — AB (ref 26.0–34.0)
MCHC: 31.2 g/dL — ABNORMAL LOW (ref 32.0–36.0)
MCV: 70.3 fL — AB (ref 80.0–100.0)
Monocytes Absolute: 0.5 10*3/uL (ref 0.2–0.9)
Monocytes Relative: 5 %
NEUTROS ABS: 7.9 10*3/uL — AB (ref 1.4–6.5)
NEUTROS PCT: 68 %
Platelets: 439 10*3/uL (ref 150–440)
RBC: 5.04 MIL/uL (ref 3.80–5.20)
RDW: 22.7 % — ABNORMAL HIGH (ref 11.5–14.5)
WBC: 11.6 10*3/uL — AB (ref 3.6–11.0)

## 2016-07-18 LAB — COMPREHENSIVE METABOLIC PANEL
ALK PHOS: 108 U/L (ref 38–126)
ALT: 25 U/L (ref 14–54)
AST: 29 U/L (ref 15–41)
Albumin: 3.6 g/dL (ref 3.5–5.0)
Anion gap: 11 (ref 5–15)
BUN: 7 mg/dL (ref 6–20)
CALCIUM: 9.2 mg/dL (ref 8.9–10.3)
CO2: 24 mmol/L (ref 22–32)
CREATININE: 0.83 mg/dL (ref 0.44–1.00)
Chloride: 103 mmol/L (ref 101–111)
Glucose, Bld: 116 mg/dL — ABNORMAL HIGH (ref 65–99)
Potassium: 3.2 mmol/L — ABNORMAL LOW (ref 3.5–5.1)
Sodium: 138 mmol/L (ref 135–145)
Total Bilirubin: 0.3 mg/dL (ref 0.3–1.2)
Total Protein: 7.2 g/dL (ref 6.5–8.1)

## 2016-07-18 LAB — URINALYSIS COMPLETE WITH MICROSCOPIC (ARMC ONLY)
Bilirubin Urine: NEGATIVE
Glucose, UA: NEGATIVE mg/dL
KETONES UR: NEGATIVE mg/dL
Nitrite: NEGATIVE
PROTEIN: 30 mg/dL — AB
Specific Gravity, Urine: 1.018 (ref 1.005–1.030)
pH: 6 (ref 5.0–8.0)

## 2016-07-18 LAB — POCT PREGNANCY, URINE: Preg Test, Ur: NEGATIVE

## 2016-07-18 LAB — LIPASE, BLOOD: LIPASE: 25 U/L (ref 11–51)

## 2016-07-18 MED ORDER — ONDANSETRON HCL 4 MG/2ML IJ SOLN
4.0000 mg | Freq: Once | INTRAMUSCULAR | Status: DC
  Filled 2016-07-18: qty 2

## 2016-07-18 MED ORDER — ONDANSETRON HCL 4 MG/2ML IJ SOLN
4.0000 mg | Freq: Once | INTRAMUSCULAR | Status: AC
  Administered 2016-07-18: 4 mg via INTRAVENOUS
  Filled 2016-07-18: qty 2

## 2016-07-18 MED ORDER — FENTANYL CITRATE (PF) 100 MCG/2ML IJ SOLN
50.0000 ug | Freq: Once | INTRAMUSCULAR | Status: AC
Start: 2016-07-18 — End: 2016-07-18
  Administered 2016-07-18: 50 ug via INTRAVENOUS
  Filled 2016-07-18: qty 2

## 2016-07-18 MED ORDER — FENTANYL CITRATE (PF) 100 MCG/2ML IJ SOLN
50.0000 ug | Freq: Once | INTRAMUSCULAR | Status: DC
  Filled 2016-07-18: qty 2

## 2016-07-18 NOTE — ED Notes (Signed)
Pt's family out to desk asking for po fluids. Explanation of need for md evaulation prior to po fluids for pt provided, family verbalizes understanding.

## 2016-07-18 NOTE — ED Provider Notes (Signed)
[facility] Emergency Department Provider Note   ____________________________________________   First MD Initiated Contact with Patient 07/18/16 [phone]     (approximate)  I have reviewed the triage vital signs and the nursing notes.   HISTORY  Chief Complaint Abdominal Pain    HPI Faith [name] is a 25 y.o. female who reports onset of upper abdominal pain today. She told me that it actually is on the right side and is been coming and going for about 3 days. Seems to get worse after eating. She is not short of breath with it. It does not seem to hurt with deep breathing. She delivered about 3 weeks ago baby is doing well delivery was a C-section. She is not having any fever or chills.  Past Medical History:  Diagnosis Date  . Asthma   . Depression   . Diabetes mellitus without complication ([place])   . Gallstones   . Heart murmur   . Migraines   . Ovarian cyst   . Seizures ([place])   . Tubal pregnancy     Patient Active Problem List   Diagnosis Date Noted  . Indication for care in labor or delivery 06/20/2016  . Suprapubic abdominal pain 04/07/2016  . Abdominal pain in pregnancy 03/15/2016  . Diabetes mellitus during pregnancy, antepartum 01/16/2016  . ADD (attention deficit disorder) 07/17/2015  . Affective bipolar disorder ([place]) 07/17/2015  . Body mass index of 60 or higher 01/20/2015  . Post traumatic stress disorder (PTSD) 06/16/2013  . Depression 06/15/2013  . Suicidal ideation 06/15/2013  . Status epilepticus ([place]) 06/14/2013  . Diabetes mellitus type 2 in obese ([place]) 06/14/2013    Past Surgical History:  Procedure Laterality Date  . CESAREAN SECTION N/A 06/22/2016   Procedure: CESAREAN SECTION;  Surgeon: Malachy Mood, MD;  Location: [facility] ORS;  Service: Obstetrics;  Laterality: N/A;  . DILATION AND CURETTAGE OF UTERUS    . ovarian cyst removed    . SALPINGECTOMY Right 2013   ectopic pregnancy  . TONSILLECTOMY    . TONSILLECTOMY AND  ADENOIDECTOMY      Prior to Admission medications   Medication Sig Start Date End Date Taking? Authorizing Provider  albuterol (PROVENTIL HFA;VENTOLIN HFA) 108 (90 BASE) MCG/ACT inhaler Inhale 2 puffs into the lungs every 6 (six) hours as needed. For shortness of breath    Historical Provider, MD  blood glucose meter kit and supplies KIT Dispense based on patient and insurance preference. Use up to four times daily as directed. (FOR ICD-9 250.00, 250.01). 06/18/15   Ahmed Prima, MD  ferrous sulfate (FERROUSUL) 325 (65 FE) MG tablet Take 1 tablet (325 mg total) by mouth 2 (two) times daily. 06/25/16   Malachy Mood, MD  folic acid (FOLVITE) 1 MG tablet Take 3 mg by mouth daily.    Historical Provider, MD  ibuprofen (ADVIL,MOTRIN) 600 MG tablet Take 1 tablet (600 mg total) by mouth every 6 (six) hours. 06/25/16   Malachy Mood, MD  Lacosamide 100 MG TABS Take 1 tablet by mouth 2 (two) times daily. Reported on 01/20/2016 11/23/13   Historical Provider, MD  metFORMIN (GLUCOPHAGE) 500 MG tablet Take 1,500 mg by mouth daily with breakfast.    Historical Provider, MD  oxyCODONE-acetaminophen (PERCOCET/ROXICET) 5-325 MG tablet Take 1-2 tablets by mouth every 3 (three) hours as needed for moderate pain. 06/25/16   Malachy Mood, MD  ranitidine (ZANTAC) 150 MG tablet Take 1 tablet (150 mg total) by mouth at bedtime. 06/18/15  Ahmed Prima, MD    Allergies Morphine; Morphine and related; Sulfasalazine; Tomato; Toradol [ketorolac tromethamine]; Dilaudid [hydromorphone hcl]; Latex; Sulfa antibiotics; Tramadol; Hydrocodone-acetaminophen; Iohexol; Lamictal [lamotrigine]; and Mushroom extract complex  Family History  Problem Relation Age of Onset  . Heart disease Father   . Huntington's disease Mother     Social History Social History  Substance Use Topics  . Smoking status: Current Every Day Smoker    Packs/day: 0.25    Years: 3.00    Types: Cigarettes  . Smokeless tobacco: Never Used    . Alcohol use No    Review of Systems Constitutional: No fever/chills Eyes: No visual changes. ENT: No sore throat. Cardiovascular: Denies chest pain. Respiratory: Denies shortness of breath. Gastrointestinal: See history of present illness Genitourinary: Negative for dysuria. Patient is still bleeding. Musculoskeletal: Negative for back pain. Skin: Negative for rash. Neurological: Negative for headaches, focal weakness or numbness.  10-point ROS otherwise negative.  ____________________________________________   PHYSICAL EXAM:  VITAL SIGNS: ED Triage Vitals  Enc Vitals Group     BP 07/18/16 0422 130/75     Pulse Rate 07/18/16 0422 (!) 105     Resp 07/18/16 0422 18     Temp 07/18/16 0422 98.2 F (36.8 C)     Temp Source 07/18/16 0422 Oral     SpO2 07/18/16 0422 97 %     Weight 07/18/16 0420 195 lb (88.5 kg)     Height 07/18/16 0420 5' (1.524 m)     Head Circumference --      Peak Flow --      Pain Score 07/18/16 0420 10     Pain Loc --      Pain Edu? --      Excl. in [place]? --     Constitutional: Alert and oriented. Well appearing and in no acute distress. Eyes: Conjunctivae are normal. PERRL. EOMI. Head: Atraumatic. Nose: No congestion/rhinnorhea. Mouth/Throat: Mucous membranes are moist.  Oropharynx non-erythematous. Neck: No stridor.  Cardiovascular: Normal rate, regular rhythm. Grossly normal heart sounds.  Good peripheral circulation. Respiratory: Normal respiratory effort.  No retractions. Lungs CTAB. Gastrointestinal: Soft Tender in the right upper quadrant and in the right CVA area to percussion.. No distention. No abdominal bruits.  Musculoskeletal: No lower extremity tenderness nor edema.  No joint effusions. Neurologic:  Normal speech and language. No gross focal neurologic deficits are appreciated. No gait instability. Skin:  Skin is warm, dry and intact. No rash noted. Psychiatric: Mood and affect are normal. Speech and behavior are  normal.  ____________________________________________   LABS (all labs ordered are listed, but only abnormal results are displayed)  Labs Reviewed  CBC WITH DIFFERENTIAL/PLATELET - Abnormal; Notable for the following:       Result Value   WBC 11.6 (*)    Hemoglobin 11.1 (*)    MCV 70.3 (*)    MCH 21.9 (*)    MCHC 31.2 (*)    RDW 22.7 (*)    Neutro Abs 7.9 (*)    All other components within normal limits  COMPREHENSIVE METABOLIC PANEL - Abnormal; Notable for the following:    Potassium 3.2 (*)    Glucose, Bld 116 (*)    All other components within normal limits  URINALYSIS COMPLETEWITH MICROSCOPIC ([facility] ONLY) - Abnormal; Notable for the following:    Color, Urine YELLOW (*)    APPearance HAZY (*)    Hgb urine dipstick 2+ (*)    Protein, ur 30 (*)    Leukocytes, UA  3+ (*)    Bacteria, UA RARE (*)    Squamous Epithelial / LPF 0-5 (*)    All other components within normal limits  LIPASE, BLOOD  POC URINE PREG, ED  POCT PREGNANCY, URINE   ____________________________________________  EKG   ____________________________________________  RADIOLOGY  Study Result   CLINICAL DATA:  Right flank pain for 3 days.  EXAM: RENAL / URINARY TRACT ULTRASOUND COMPLETE  COMPARISON:  None.  FINDINGS: Right Kidney:  Length: 12.4 cm. Echogenicity within normal limits. No mass or hydronephrosis visualized.  Left Kidney:  Length: 11.2 cm. Echogenicity within normal limits. No mass or hydronephrosis visualized.  Bladder:  Appears normal for degree of bladder distention.  IMPRESSION: Normal renal ultrasound.   Electronically Signed   By: Franki Cabot M.D.   On: 07/18/2016 11:11    Study Result   CLINICAL DATA:  Right upper quadrant pain, worse after eating, for 3 days.  EXAM: US ABDOMEN LIMITED - RIGHT UPPER QUADRANT  COMPARISON:  None.  FINDINGS: Gallbladder:  Multiple small stones layering within the gallbladder. No gallbladder wall  thickening or pericholecystic edema. No sonographic Murphy's sign elicited, per the sonographer.  Common bile duct:  Diameter: 4 mm  Liver:  No focal lesion identified. Within normal limits in parenchymal echogenicity.  IMPRESSION: 1. Cholelithiasis without evidence of acute cholecystitis. 2. No bile duct dilatation. 3. Overall, no acute findings.   Electronically Signed   By: Franki Cabot M.D.   On: 07/18/2016 11:13    ____________________________________________   PROCEDURES  Procedure(s) performed:   Procedures  Critical Care performed:   ____________________________________________   INITIAL IMPRESSION / ASSESSMENT AND PLAN / ED COURSE  Pertinent labs & imaging results that were available during my care of the patient were reviewed by me and considered in my medical decision making (see chart for details).    Clinical Course   Patient informed that she has gallstones and wants she wants to know what that means and whether that came from her pregnancy. I told her I doubt that it would come from the pregnancy probably were there before. I informed her that I would call the surgeon to have him discuss with her courses of action. She and her mother say that they do not want surgery at [facility]. They said he went to go to [facility]. I will see if I can relieve the patient's pain and make a referral. Patient cannot take anything but fentanyl.  Patient is now refusing pain medicine she says she can't take pain medicine because she has to take care of her baby. Her the person who says that she is her mother is in the room with her and says that she cannot take care of the baby because she has her own family. They say she has to get a hold of her spouse to help take care of the baby. Patient is not making any effort to call her spouse. I explained to the patient again that if I can get through the pain the patient can follow-up as an outpatient and get surgery  later if I can't get rid of the pain and then perhaps the patient may need surgery now and that I can call [facility]. Patient says she doesn't want any pain medicine just wants to go to [facility]. I will try to call [facility] and see if [facility] will take the patient. Again patient has no urinary tract symptoms she does still have pain in the right upper quadrant to palpation  and in the right CVA area. She is not running a fever.  Patient now wants to go home with pain medicine. I have discussed with her and her mother repeatedly the fact that she needs to see a surgeon at some point and that she may need to see a surgeon today if the pain doesn't get any better. Patient does not want pain medicine here though. I cannot seem to explained to the patient that she has gallstones and may need surgery if the pain doesn't get better. I have said that at least 5 times. ____________________________________________   FINAL CLINICAL IMPRESSION(S) / ED DIAGNOSES  Final diagnoses:  RUQ pain  Right flank pain      NEW MEDICATIONS STARTED DURING THIS VISIT:  New Prescriptions   No medications on file     Note:  This document was prepared using Dragon voice recognition software and may include unintentional dictation errors.    Nena Polio, MD 07/18/16 [phone]

## 2016-07-18 NOTE — ED Notes (Signed)
Pt updated on delay for md assessment. Pt's mother in law states "you need to hire more doctors, there's only two right now, ain't no doctor that can come in here right now?" explanation of delay again explained to family who verbalizes understanding after repeated explanation. Mother in law then states "well if they busy, they busy savin' lives, ya'll saved my husband, that's for sure." extra formula bottles provided to pt for baby in room.

## 2016-07-18 NOTE — ED Notes (Signed)

## 2016-07-18 NOTE — ED Notes (Signed)
Report to alicia, rn.  

## 2016-07-18 NOTE — ED Notes (Signed)
Attempt x2 without success iv initiation. Pt tolerated poorly, moving and flexing arm while rn attempting.

## 2016-07-18 NOTE — ED Notes (Signed)
Pt updated on wait/delay. Per US, pt is the next one on their list. Pt remains in NAD at this time, is sitting upright in bed holding baby. Family seated at bedside. VSS.

## 2016-07-18 NOTE — Discharge Instructions (Signed)
You have gallstones. The gallstones are probably what is causing your pain. I would like to try to see if pain medicine would make your pain better. If the pain medicine did not make your pain better you probably should have surgery very soon. The best thing to do would be to try to give you pain medicine here and see if that relieves the pain. If it didn't I would refer you to surgery. Since you do not want pain medicine here I will give you a small amount of Percocet. I understand that you were taking Percocet without any trouble just a few days ago. If the pain gets worse or if you get nausea vomiting or fever you need to go to the nearest hospital and be evaluated to see if you need surgery. Since you do not want to have surgery here I would recommend that you would go to another hospital. [facility]. [facility] to see if they wanted to evaluate you today. I understand that you now want to go home with the pain medicine. This is not something I recommend. However since that is her desire I will allow you to do so. Please remember if you get fever vomiting or worse pain you need to go to the hospital immediately.

## 2016-07-18 NOTE — ED Triage Notes (Signed)
Pt presents to ED with c/o RUQ and LUQ abdominal pain with radiation in RIGHT flank. Pt reports giving birth 3 weeks ago and questions if the pain might be related. Pt reports bay was delivered by C-section and required a blood transfusion d/t complications. Pt reports N/V, but denies fever, chest pain or diarrhea.

## 2016-07-18 NOTE — ED Notes (Signed)
This RN went to give pt her medications, pt refused them stating "I want to know whats going on before you give me anything" this RN informed MD and EDP talked to pt again about plan.  This RN offered pt the medication again and pt still refused

## 2016-07-18 NOTE — ED Notes (Signed)
Pt called for nurse so this nurse went in to check on her. Pt states she doesn't understand why she "just can't be discharged with some pain medicine". Spoke with dr Darnelle Catalanmalinda then went back in with pt. Explained she can be discharged and can follow up with surgeon. Pt adamant she will need something strong. Spoke with pt regarding breast feeding and pain meds - she states she was on percocet and will need something strong. Relayed info to dr. Darnelle Catalanmalinda

## 2016-07-18 NOTE — ED Notes (Signed)
Patient transported to Ultrasound 

## 2016-07-21 ENCOUNTER — Ambulatory Visit: Payer: Medicaid Other | Admitting: Surgery

## 2016-07-22 ENCOUNTER — Ambulatory Visit (INDEPENDENT_AMBULATORY_CARE_PROVIDER_SITE_OTHER): Payer: Medicaid Other | Admitting: Surgery

## 2016-07-22 ENCOUNTER — Encounter: Payer: Self-pay | Admitting: Surgery

## 2016-07-22 VITALS — BP 122/65 | HR 82 | Temp 98.3°F | Ht 60.0 in | Wt 212.0 lb

## 2016-07-22 DIAGNOSIS — R1084 Generalized abdominal pain: Secondary | ICD-10-CM

## 2016-07-22 MED ORDER — GABAPENTIN 300 MG PO CAPS: 300.0000 mg | ORAL_CAPSULE | Freq: Three times a day (TID) | ORAL | 1 refills | Status: DC

## 2016-07-22 NOTE — Progress Notes (Signed)
Patient ID: Faith [name], female   DOB: 12-28-1990, 25 y.o.   MRN: [id number]  HPI Faith [name] is a 25 y.o. female seen for biliary colic. She has multiple comorbidities including anxiety disorder, depression, bipolar disorder, diabetes, seizure disorder. Presented to the emergency room complaining of right upper quadrant pain associated with nausea and vomiting. No fevers or chills no evidence of biliary Obstruction. Ultrasound personally reviewed there is evidence of gallstones, and normal common bile duct. No evidence of biliary dilation. No evidence of cholecystitis. LFTs are normal. Of note she had a C-section a month ago.  HPI  Past Medical History:  Diagnosis Date  . Asthma   . Depression   . Diabetes mellitus without complication ([place])   . Gallstones   . Heart murmur   . Migraines   . Ovarian cyst   . Seizures ([place])   . Tubal pregnancy     Past Surgical History:  Procedure Laterality Date  . CESAREAN SECTION N/A 06/22/2016   Procedure: CESAREAN SECTION;  Surgeon: Malachy Mood, MD;  Location: [facility] ORS;  Service: Obstetrics;  Laterality: N/A;  . DILATION AND CURETTAGE OF UTERUS    . ovarian cyst removed    . SALPINGECTOMY Right 2013   ectopic pregnancy  . TONSILLECTOMY    . TONSILLECTOMY AND ADENOIDECTOMY      Family History  Problem Relation Age of Onset  . Heart disease Father   . Huntington's disease Mother     Social History Social History  Substance Use Topics  . Smoking status: Current Every Day Smoker    Packs/day: 0.25    Years: 3.00    Types: Cigarettes  . Smokeless tobacco: Never Used  . Alcohol use No    Allergies  Allergen Reactions  . Ketorolac Anaphylaxis, Hives and Swelling  . Morphine Other (See Comments)    bradycradia bradycradia  . Morphine And Related Other (See Comments)    bradycardia  . Sulfasalazine Hives  . Tomato Anaphylaxis  . Toradol [Ketorolac Tromethamine] Anaphylaxis, Hives and Swelling  . Dilaudid [Hydromorphone  Hcl] Hives  . Latex Rash  . Sulfa Antibiotics Hives  . Tramadol Swelling, Rash and Hives  . Hydrocodone-Acetaminophen Other (See Comments)  . Iohexol Other (See Comments)    Pt. States she can not take iohexol but won't state reaction type. Pt. States she can not take iohexol but won't state reaction type. Pt. States she can not take iohexol but won't state reaction type.  . Lamictal [Lamotrigine] Other (See Comments)  . Sulfur Other (See Comments)  . Mushroom Extract Complex Rash    Current Outpatient Prescriptions  Medication Sig Dispense Refill  . albuterol (PROVENTIL HFA;VENTOLIN HFA) 108 (90 BASE) MCG/ACT inhaler Inhale 2 puffs into the lungs every 6 (six) hours as needed. For shortness of breath    . amphetamine-dextroamphetamine (ADDERALL) 10 MG tablet Take 10 mg by mouth daily with breakfast.    . blood glucose meter kit and supplies KIT Dispense based on patient and insurance preference. Use up to four times daily as directed. (FOR ICD-9 250.00, 250.01). 1 each 0  . clonazePAM (KLONOPIN) 1 MG tablet Take 1 mg by mouth 2 (two) times daily.    . ferrous sulfate (FERROUSUL) 325 (65 FE) MG tablet Take 1 tablet (325 mg total) by mouth 2 (two) times daily. 60 tablet 1  . FLUoxetine (PROZAC) 20 MG tablet Take 20 mg by mouth daily.    . folic acid (FOLVITE) 1 MG tablet Take 3 mg  by mouth daily.    Marland Kitchen ibuprofen (ADVIL,MOTRIN) 600 MG tablet Take 1 tablet (600 mg total) by mouth every 6 (six) hours. 30 tablet 0  . Lacosamide 100 MG TABS Take 1 tablet by mouth 2 (two) times daily. Reported on 01/20/2016    . metFORMIN (GLUCOPHAGE) 500 MG tablet Take 1,500 mg by mouth daily with breakfast.    . oxyCODONE-acetaminophen (PERCOCET/ROXICET) 5-325 MG tablet Take 1-2 tablets by mouth every 3 (three) hours as needed for moderate pain. 30 tablet 0  . ranitidine (ZANTAC) 150 MG tablet Take 1 tablet (150 mg total) by mouth at bedtime. 30 tablet 1  . gabapentin (NEURONTIN) 300 MG capsule Take 1 capsule  (300 mg total) by mouth 3 (three) times daily. 90 capsule 1   No current facility-administered medications for this visit.      Review of Systems A 10 point review of systems was asked and was negative except for the information on the HPI  Physical Exam Blood pressure 122/65, pulse 82, temperature 98.3 F (36.8 C), temperature source Oral, height 5' (1.524 m), weight 96.2 kg (212 lb), unknown if currently breastfeeding. CONSTITUTIONAL: NAD Obese EYES: Pupils are equal, round, and reactive to light, Sclera are non-icteric. EARS, NOSE, MOUTH AND THROAT: The oropharynx is clear. The oral mucosa is pink and moist. Hearing is intact to voice. LYMPH NODES:  Lymph nodes in the neck are normal. RESPIRATORY:  Lungs are clear. There is normal respiratory effort, with equal breath sounds bilaterally, and without pathologic use of accessory muscles. There is chest wall tenderness on the right side CARDIOVASCULAR: Heart is regular without murmurs, gallops, or rubs. GI: The abdomen is soft,mild ttp RUQ, no peritonitis no murphy. C section scar well healed. GU: Rectal deferred.   MUSCULOSKELETAL: Normal muscle strength and tone. No cyanosis or edema.   SKIN: Turgor is good and there are no pathologic skin lesions or ulcers. NEUROLOGIC: Motor and sensation is grossly normal. Cranial nerves are grossly intact. PSYCH:  Oriented to person, place and time. Affect is normal.  Data Reviewed I have personally reviewed the patient's imaging, laboratory findings and medical records.    Assessment Plan 25 year old female with symptomatic cholelithiasis. At this point there is no need for emergent surgical revision. Discussed with the patient in detail about her disease process. 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 I do think that some of her symptoms are related to biliary in nature and I do think that she will benefit from laparoscopic  cholecystectomy.The risks, benefits, complications, treatment options, and expected outcomes were discussed with the patient. The possibilities of bleeding, recurrent infection, finding a normal gallbladder, perforation of viscus organs, damage to surrounding structures, bile leak, abscess formation, needing a drain placed, the need for additional procedures, reaction to medication, pulmonary aspiration,  failure to diagnose a condition, the possible need to convert to an open procedure, and creating a complication requiring transfusion or operation were discussed with the patient. The patient and/or family concurred with the proposed plan, giving informed consent.  We'll plan for laparoscopic cholecystectomy in about 3 weeks that will give [place] a little bit more time for  the uterus to shrink further and for  any potential adhesions to improve. I will also provide gabapentin to help with her chest wall pain ( neuralgia)  Caroleen Hamman, MD FACS General Surgeon 07/22/2016, 10:22 AM

## 2016-07-22 NOTE — Patient Instructions (Signed)
Please look at your blue sheet if you have any questions or concerns.

## 2016-07-23 ENCOUNTER — Emergency Department
Admission: EM | Admit: 2016-07-23 | Discharge: 2016-07-24 | Disposition: A | Discharge status: Home / Self Care | Payer: Medicaid Other | Attending: Emergency Medicine | Admitting: Emergency Medicine

## 2016-07-23 ENCOUNTER — Encounter: Payer: Self-pay | Admitting: *Deleted

## 2016-07-23 ENCOUNTER — Telehealth: Payer: Self-pay | Admitting: Surgery

## 2016-07-23 DIAGNOSIS — E119 Type 2 diabetes mellitus without complications: Secondary | ICD-10-CM | POA: Diagnosis not present

## 2016-07-23 DIAGNOSIS — F909 Attention-deficit hyperactivity disorder, unspecified type: Secondary | ICD-10-CM | POA: Diagnosis not present

## 2016-07-23 DIAGNOSIS — R1011 Right upper quadrant pain: Secondary | ICD-10-CM

## 2016-07-23 DIAGNOSIS — J45909 Unspecified asthma, uncomplicated: Secondary | ICD-10-CM | POA: Insufficient documentation

## 2016-07-23 DIAGNOSIS — M792 Neuralgia and neuritis, unspecified: Secondary | ICD-10-CM | POA: Insufficient documentation

## 2016-07-23 DIAGNOSIS — F17210 Nicotine dependence, cigarettes, uncomplicated: Secondary | ICD-10-CM | POA: Diagnosis not present

## 2016-07-23 DIAGNOSIS — Z79899 Other long term (current) drug therapy: Secondary | ICD-10-CM | POA: Insufficient documentation

## 2016-07-23 DIAGNOSIS — N3000 Acute cystitis without hematuria: Secondary | ICD-10-CM | POA: Insufficient documentation

## 2016-07-23 DIAGNOSIS — K8020 Calculus of gallbladder without cholecystitis without obstruction: Secondary | ICD-10-CM | POA: Insufficient documentation

## 2016-07-23 DIAGNOSIS — Z7984 Long term (current) use of oral hypoglycemic drugs: Secondary | ICD-10-CM | POA: Insufficient documentation

## 2016-07-23 DIAGNOSIS — Z791 Long term (current) use of non-steroidal anti-inflammatories (NSAID): Secondary | ICD-10-CM | POA: Insufficient documentation

## 2016-07-23 LAB — CBC
HCT: 35.1 % (ref 35.0–47.0)
Hemoglobin: 11.2 g/dL — ABNORMAL LOW (ref 12.0–16.0)
MCH: 22 pg — AB (ref 26.0–34.0)
MCHC: 31.9 g/dL — AB (ref 32.0–36.0)
MCV: 69.1 fL — ABNORMAL LOW (ref 80.0–100.0)
PLATELETS: 436 10*3/uL (ref 150–440)
RBC: 5.08 MIL/uL (ref 3.80–5.20)
RDW: 22.2 % — AB (ref 11.5–14.5)
WBC: 14 10*3/uL — ABNORMAL HIGH (ref 3.6–11.0)

## 2016-07-23 NOTE — Telephone Encounter (Signed)
Pt advised of pre op date/time and sx date. Sx: 08/13/16 with Dr Pabon--Laparoscopic cholecystectomy.  Pre op: 08/04/16 between 9-1:00pm--Phone.   Patient made aware to call [phone], between 1-3:00pm the day before surgery, to find out what time to arrive.

## 2016-07-23 NOTE — ED Triage Notes (Signed)
Pt states upper abdominal pain that radiates around to her back associated with n/v. Seen here on the 14th and surgeon yesterday for the same. Scheduled to have her gallbladder removed on the 9th. Pain has become worse.

## 2016-07-24 LAB — URINALYSIS COMPLETE WITH MICROSCOPIC (ARMC ONLY)
BACTERIA UA: NONE SEEN
BILIRUBIN URINE: NEGATIVE
GLUCOSE, UA: NEGATIVE mg/dL
Ketones, ur: NEGATIVE mg/dL
Nitrite: NEGATIVE
Protein, ur: NEGATIVE mg/dL
Specific Gravity, Urine: 1.016 (ref 1.005–1.030)
pH: 6 (ref 5.0–8.0)

## 2016-07-24 LAB — COMPREHENSIVE METABOLIC PANEL
ALK PHOS: 138 U/L — AB (ref 38–126)
ALT: 35 U/L (ref 14–54)
ANION GAP: 9 (ref 5–15)
AST: 29 U/L (ref 15–41)
Albumin: 3.8 g/dL (ref 3.5–5.0)
BILIRUBIN TOTAL: 0.6 mg/dL (ref 0.3–1.2)
BUN: 9 mg/dL (ref 6–20)
CALCIUM: 9.2 mg/dL (ref 8.9–10.3)
CO2: 25 mmol/L (ref 22–32)
CREATININE: 0.79 mg/dL (ref 0.44–1.00)
Chloride: 104 mmol/L (ref 101–111)
GFR calc non Af Amer: >60 mL/min (ref >60)
GLUCOSE: 108 mg/dL — AB (ref 65–99)
Potassium: 3.8 mmol/L (ref 3.5–5.1)
Sodium: 138 mmol/L (ref 135–145)
TOTAL PROTEIN: 7.6 g/dL (ref 6.5–8.1)

## 2016-07-24 LAB — LIPASE, BLOOD: Lipase: 26 U/L (ref 11–51)

## 2016-07-24 MED ORDER — LIDOCAINE 5 % EX PTCH: 1.0000 | MEDICATED_PATCH | CUTANEOUS | 0 refills | Status: DC

## 2016-07-24 MED ORDER — OXYCODONE-ACETAMINOPHEN 5-325 MG PO TABS
1.0000 | ORAL_TABLET | Freq: Once | ORAL | Status: AC
  Administered 2016-07-24: 1 via ORAL
  Filled 2016-07-24: qty 1

## 2016-07-24 MED ORDER — CEPHALEXIN 500 MG PO CAPS
500.0000 mg | ORAL_CAPSULE | Freq: Once | ORAL | Status: AC
  Administered 2016-07-24: 500 mg via ORAL
  Filled 2016-07-24: qty 1

## 2016-07-24 MED ORDER — LIDOCAINE 5 % EX PTCH
1.0000 | MEDICATED_PATCH | CUTANEOUS | Status: DC
  Administered 2016-07-24: 1 via TRANSDERMAL
  Filled 2016-07-24: qty 1

## 2016-07-24 MED ORDER — ONDANSETRON 4 MG PO TBDP
4.0000 mg | ORAL_TABLET | Freq: Once | ORAL | Status: AC
  Administered 2016-07-24: 4 mg via ORAL
  Filled 2016-07-24: qty 1

## 2016-07-24 MED ORDER — CEPHALEXIN 500 MG PO CAPS
ORAL_CAPSULE | ORAL | Status: AC
  Filled 2016-07-24: qty 1

## 2016-07-24 MED ORDER — OXYCODONE-ACETAMINOPHEN 5-325 MG PO TABS: 1.0000 | ORAL_TABLET | ORAL | 0 refills | Status: DC | PRN

## 2016-07-24 MED ORDER — ONDANSETRON 4 MG PO TBDP: 4.0000 mg | ORAL_TABLET | Freq: Three times a day (TID) | ORAL | 0 refills | Status: DC | PRN

## 2016-07-24 MED ORDER — CEPHALEXIN 500 MG PO CAPS: 500.0000 mg | ORAL_CAPSULE | Freq: Three times a day (TID) | ORAL | 0 refills | Status: DC

## 2016-07-24 NOTE — ED Notes (Signed)

## 2016-07-24 NOTE — Discharge Instructions (Signed)
1. Continue to take gabapentin for your nerve pain. 2. Apply lidocaine patch as directed for nerve pain. 3. Take antibiotic as prescribed for UTI (Keflex). 4. You may take pain and nausea medicines as needed (Percocet/Zofran). 5. Return to the ER for worsening symptoms, persistent vomiting, difficulty breathing or other concerns.

## 2016-07-24 NOTE — ED Provider Notes (Signed)
[facility] Emergency Department Provider Note   ____________________________________________   First MD Initiated Contact with Patient 07/24/16 0030     (approximate)  I have reviewed the triage vital signs and the nursing notes.   HISTORY  Chief Complaint Abdominal Pain    HPI Faith [name] is a 25 y.o. female who presents to the ED from home with a chief complaint of RUQ abdominal pain and left back pain. Patient has a history of diabetes, seizures, bipolar disorder who is approximately 5 weeks postpartum, not breast-feeding. She was seen in the ED on 10/14 with similar symptoms and found to have cholelithiasis. Had an appointment with surgery 10/18 who scheduled her for surgery on 11/9. 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 Complains mostly of left back soreness to touch and right upper quadrant abdominal pain associated with occasional nausea/vomiting. Denies associated fever, chills, chest pressure, shortness of breath, diarrhea. Does complain of chest soreness to touch. Denies recent travel or trauma. Nothing makes her symptoms better or worse.   Past Medical History:  Diagnosis Date  . Asthma   . Depression   . Diabetes mellitus without complication ([place])   . Gallstones   . Heart murmur   . Migraines   . Ovarian cyst   . Seizures ([place])   . Tubal pregnancy     Patient Active Problem List   Diagnosis Date Noted  . Indication for care in labor or delivery 06/20/2016  . Suprapubic abdominal pain 04/07/2016  . Abdominal pain in pregnancy 03/15/2016  . Diabetes mellitus during pregnancy, antepartum 01/16/2016  . ADD (attention deficit disorder) 07/17/2015  . Affective bipolar disorder ([place]) 07/17/2015  . Body mass index of 60 or higher 01/20/2015  . Post traumatic stress disorder (PTSD) 06/16/2013  .  Depression 06/15/2013  . Suicidal ideation 06/15/2013  . Status epilepticus ([place]) 06/14/2013  . Diabetes mellitus type 2 in obese ([place]) 06/14/2013    Past Surgical History:  Procedure Laterality Date  . CESAREAN SECTION N/A 06/22/2016   Procedure: CESAREAN SECTION;  Surgeon: Malachy Mood, MD;  Location: [facility] ORS;  Service: Obstetrics;  Laterality: N/A;  . DILATION AND CURETTAGE OF UTERUS    . ovarian cyst removed    . SALPINGECTOMY Right 2013   ectopic pregnancy  . TONSILLECTOMY    . TONSILLECTOMY AND ADENOIDECTOMY      Prior to Admission medications   Medication Sig Start Date End Date Taking? Authorizing Provider  albuterol (PROVENTIL HFA;VENTOLIN HFA) 108 (90 BASE) MCG/ACT inhaler Inhale 2 puffs into the lungs every 6 (six) hours as needed. For shortness of breath    Historical Provider, MD  amphetamine-dextroamphetamine (ADDERALL) 10 MG tablet Take 10 mg by mouth daily with breakfast.    Historical Provider, MD  blood glucose meter kit and supplies KIT Dispense based on patient and insurance preference. Use up to four times daily as directed. (FOR ICD-9 250.00, 250.01). 06/18/15   Ahmed Prima, MD  clonazePAM (KLONOPIN) 1 MG tablet Take 1 mg by mouth 2 (two) times daily.    Historical Provider, MD  ferrous sulfate (FERROUSUL) 325 (65 FE) MG tablet Take 1 tablet (325 mg total) by mouth 2 (two) times daily. 06/25/16   Malachy Mood, MD  FLUoxetine (PROZAC) 20 MG tablet Take 20 mg by mouth daily.    Historical Provider, MD  folic acid (FOLVITE) 1 MG  tablet Take 3 mg by mouth daily.    Historical Provider, MD  gabapentin (NEURONTIN) 300 MG capsule Take 1 capsule (300 mg total) by mouth 3 (three) times daily. 07/22/16   Diego F Pabon, MD  ibuprofen (ADVIL,MOTRIN) 600 MG tablet Take 1 tablet (600 mg total) by mouth every 6 (six) hours. 06/25/16   Malachy Mood, MD  Lacosamide 100 MG TABS Take 1 tablet by mouth 2 (two) times daily. Reported on 01/20/2016 11/23/13   Historical  Provider, MD  metFORMIN (GLUCOPHAGE) 500 MG tablet Take 1,500 mg by mouth daily with breakfast.    Historical Provider, MD  oxyCODONE-acetaminophen (PERCOCET/ROXICET) 5-325 MG tablet Take 1-2 tablets by mouth every 3 (three) hours as needed for moderate pain. 06/25/16   Malachy Mood, MD  ranitidine (ZANTAC) 150 MG tablet Take 1 tablet (150 mg total) by mouth at bedtime. 06/18/15   Ahmed Prima, MD    Allergies Ketorolac; Morphine; Morphine and related; Sulfasalazine; Tomato; Toradol [ketorolac tromethamine]; Dilaudid [hydromorphone hcl]; Latex; Sulfa antibiotics; Tramadol; Hydrocodone-acetaminophen; Iohexol; Lamictal [lamotrigine]; Sulfur; and Mushroom extract complex  Family History  Problem Relation Age of Onset  . Heart disease Father   . Huntington's disease Mother     Social History Social History  Substance Use Topics  . Smoking status: Current Every Day Smoker    Packs/day: 0.25    Years: 3.00    Types: Cigarettes  . Smokeless tobacco: Never Used  . Alcohol use No    Review of Systems  Constitutional: No fever/chills. Eyes: No visual changes. ENT: No sore throat. Cardiovascular: Denies chest pain. Respiratory: Denies shortness of breath. Gastrointestinal: Positive for abdominal pain, nausea and vomiting.  No diarrhea.  No constipation. Genitourinary: Negative for dysuria. Musculoskeletal: Positive for back pain. Skin: Negative for rash. Neurological: Negative for headaches, focal weakness or numbness.  10-point ROS otherwise negative.  ____________________________________________   PHYSICAL EXAM:  VITAL SIGNS: ED Triage Vitals  Enc Vitals Group     BP 07/23/16 2347 125/66     Pulse Rate 07/23/16 2347 (!) 109     Resp 07/23/16 2347 20     Temp 07/23/16 2347 98.3 F (36.8 C)     Temp Source 07/23/16 2347 Oral     SpO2 07/23/16 2347 98 %     Weight 07/23/16 2348 212 lb (96.2 kg)     Height 07/23/16 2348 5' (1.524 m)     Head Circumference --       Peak Flow --      Pain Score 07/23/16 2348 10     Pain Loc --      Pain Edu? --      Excl. in [place]? --     Constitutional: Alert and oriented. Well appearing and in no acute distress. Eyes: Conjunctivae are normal. PERRL. EOMI. Head: Atraumatic. Nose: No congestion/rhinnorhea. Mouth/Throat: Mucous membranes are moist.  Oropharynx non-erythematous. Neck: No stridor.   Cardiovascular: Normal rate, regular rhythm. Grossly normal heart sounds.  Good peripheral circulation. Respiratory: Normal respiratory effort.  No retractions. Lungs CTAB. Anterior chest wall tender to palpation.  Gastrointestinal: Soft and mild tender to palpation palpation right upper quadrant without rebound or guarding. No distention. No abdominal bruits. No CVA tenderness. Musculoskeletal: Sensitive to touch left thoracic back. No lower extremity tenderness nor edema.  No joint effusions. Neurologic:  Normal speech and language. No gross focal neurologic deficits are appreciated. No gait instability. Skin:  Skin is warm, dry and intact. No rash noted. Psychiatric: Mood and affect are  normal. Speech and behavior are normal.  ____________________________________________   LABS (all labs ordered are listed, but only abnormal results are displayed)  Labs Reviewed  COMPREHENSIVE METABOLIC PANEL - Abnormal; Notable for the following:       Result Value   Glucose, Bld 108 (*)    Alkaline Phosphatase 138 (*)    All other components within normal limits  CBC - Abnormal; Notable for the following:    WBC 14.0 (*)    Hemoglobin 11.2 (*)    MCV 69.1 (*)    MCH 22.0 (*)    MCHC 31.9 (*)    RDW 22.2 (*)    All other components within normal limits  URINALYSIS COMPLETEWITH MICROSCOPIC ([facility] ONLY) - Abnormal; Notable for the following:    Color, Urine YELLOW (*)    APPearance CLEAR (*)    Hgb urine dipstick 1+ (*)    Leukocytes, UA 3+ (*)    Squamous Epithelial / LPF 0-5 (*)    All other components within normal limits   LIPASE, BLOOD   ____________________________________________  EKG  None ____________________________________________  RADIOLOGY  None ____________________________________________   PROCEDURES  Procedure(s) performed: None  Procedures  Critical Care performed: No  ____________________________________________   INITIAL IMPRESSION / ASSESSMENT AND PLAN / ED COURSE  Pertinent labs & imaging results that were available during my care of the patient were reviewed by me and considered in my medical decision making (see chart for details).  25 year old female with known cholelithiasis scheduled for surgery in 2 weeks who complains of right upper quadrant abdominal pain. Sensitive to touch to left thoracic back; thought to have a component of intercostal neuralgia. LFTs and lipase within normal limits. Mild leukocytosis which may be secondary to UTI. Will place patient on antibiotic, analgesia, lidocaine patch in addition to her gabapentin. Low clinical suspicion for ACS, PE, aortic dissection. Encouraged patient to follow-up with both her PCP and keep her surgery date as scheduled. Strict return precautions given. Patient verbalizes understanding and agrees with plan of care.  Clinical Course     ____________________________________________   FINAL CLINICAL IMPRESSION(S) / ED DIAGNOSES  Final diagnoses:  Neuralgia  Right upper quadrant abdominal pain  Acute cystitis without hematuria  Calculus of gallbladder without cholecystitis without obstruction      NEW MEDICATIONS STARTED DURING THIS VISIT:  New Prescriptions   No medications on file     Note:  This document was prepared using Dragon voice recognition software and may include unintentional dictation errors.    Paulette Blanch, MD 07/24/16 [id number]

## 2016-08-04 ENCOUNTER — Encounter
Admission: RE | Admit: 2016-08-04 | Discharge: 2016-08-04 | Disposition: A | Discharge status: Home / Self Care | Payer: Medicaid Other | Source: Ambulatory Visit | Attending: Surgery | Admitting: Surgery

## 2016-08-04 HISTORY — DX: Anemia, unspecified: D64.9

## 2016-08-04 HISTORY — DX: Gastro-esophageal reflux disease without esophagitis: K21.9

## 2016-08-04 NOTE — Patient Instructions (Signed)
  Your procedure is scheduled on: 08-13-16  Report to Same Day Surgery 2nd floor medical mall To find out your arrival time please call [phone] between 1PM - 3PM on 08-12-16  Remember: Instructions that are not followed completely may result in serious medical risk, up to and including death, or upon the discretion of your surgeon and anesthesiologist your surgery may need to be rescheduled.    _x___ 1. Do not eat food or drink liquids after midnight. No gum chewing or hard candies.     __x__ 2. No Alcohol for 24 hours before or after surgery.   __x__3. No Smoking for 24 prior to surgery.   ____  4. Bring all medications with you on the day of surgery if instructed.    __x__ 5. Notify your doctor if there is any change in your medical condition     (cold, fever, infections).     Do not wear jewelry, make-up, hairpins, clips or nail polish.  Do not wear lotions, powders, or perfumes. You may wear deodorant.  Do not shave 48 hours prior to surgery. Men may shave face and neck.  Do not bring valuables to the hospital.    [facility] is not responsible for any belongings or valuables.               Contacts, dentures or bridgework may not be worn into surgery.  Leave your suitcase in the car. After surgery it may be brought to your room.  For patients admitted to the hospital, discharge time is determined by your treatment team.   Patients discharged the day of surgery will not be allowed to drive home.    Please read over the following fact sheets that you were given:   [facility] Preparing for Surgery and or MRSA Information   _x___ Take these medicines the morning of surgery with A SIP OF WATER:    1. KLONOPIN  2. GABAPENTIN  3. ZANTAC  4. PROZAC  5.  6.  ____Fleets enema or Magnesium Citrate as directed.   ____ Use CHG Soap or sage wipes as directed on instruction sheet   _X___ Use inhalers on the day of surgery and bring to hospital day of surgery-USE ALBUTEROL  INHALER AT HOME AND BRING TO HOSPITAL  _X___ Stop metformin 2 days prior to surgery-LAST DOSE ON Monday, 08-10-16    ____ Take 1/2 of usual insulin dose the night before surgery and none on the morning of  surgery.   ____ Stop aspirin or coumadin, or plavix  x__ Stop Anti-inflammatories such as Advil, Aleve, Ibuprofen, Motrin, Naproxen,          Naprosyn, Goodies powders or aspirin products-STOP IBUPROFEN NOW-Ok to take PERCOCET    ____ Stop supplements until after surgery.    ____ Bring C-Pap to the hospital.

## 2016-08-13 ENCOUNTER — Ambulatory Visit: Payer: Medicaid Other | Admitting: Anesthesiology

## 2016-08-13 ENCOUNTER — Ambulatory Visit
Admission: RE | Admit: 2016-08-13 | Discharge: 2016-08-13 | Disposition: A | Discharge status: Home / Self Care | Payer: Medicaid Other | Source: Ambulatory Visit | Attending: Surgery | Admitting: Surgery

## 2016-08-13 ENCOUNTER — Ambulatory Visit: Payer: Medicaid Other

## 2016-08-13 ENCOUNTER — Encounter
Admission: RE | Disposition: A | Discharge status: Home / Self Care | Payer: Self-pay | Source: Ambulatory Visit | Attending: Surgery

## 2016-08-13 DIAGNOSIS — R569 Unspecified convulsions: Secondary | ICD-10-CM | POA: Insufficient documentation

## 2016-08-13 DIAGNOSIS — J45909 Unspecified asthma, uncomplicated: Secondary | ICD-10-CM | POA: Insufficient documentation

## 2016-08-13 DIAGNOSIS — R0789 Other chest pain: Secondary | ICD-10-CM | POA: Insufficient documentation

## 2016-08-13 DIAGNOSIS — Z91040 Latex allergy status: Secondary | ICD-10-CM | POA: Insufficient documentation

## 2016-08-13 DIAGNOSIS — Z888 Allergy status to other drugs, medicaments and biological substances status: Secondary | ICD-10-CM | POA: Insufficient documentation

## 2016-08-13 DIAGNOSIS — M792 Neuralgia and neuritis, unspecified: Secondary | ICD-10-CM | POA: Diagnosis not present

## 2016-08-13 DIAGNOSIS — Z91018 Allergy to other foods: Secondary | ICD-10-CM | POA: Insufficient documentation

## 2016-08-13 DIAGNOSIS — F17210 Nicotine dependence, cigarettes, uncomplicated: Secondary | ICD-10-CM | POA: Diagnosis not present

## 2016-08-13 DIAGNOSIS — Z419 Encounter for procedure for purposes other than remedying health state, unspecified: Secondary | ICD-10-CM

## 2016-08-13 DIAGNOSIS — Z7984 Long term (current) use of oral hypoglycemic drugs: Secondary | ICD-10-CM | POA: Insufficient documentation

## 2016-08-13 DIAGNOSIS — Z885 Allergy status to narcotic agent status: Secondary | ICD-10-CM | POA: Diagnosis not present

## 2016-08-13 DIAGNOSIS — D649 Anemia, unspecified: Secondary | ICD-10-CM | POA: Diagnosis not present

## 2016-08-13 DIAGNOSIS — K219 Gastro-esophageal reflux disease without esophagitis: Secondary | ICD-10-CM | POA: Diagnosis not present

## 2016-08-13 DIAGNOSIS — F319 Bipolar disorder, unspecified: Secondary | ICD-10-CM | POA: Diagnosis not present

## 2016-08-13 DIAGNOSIS — K810 Acute cholecystitis: Secondary | ICD-10-CM | POA: Diagnosis not present

## 2016-08-13 DIAGNOSIS — Z8249 Family history of ischemic heart disease and other diseases of the circulatory system: Secondary | ICD-10-CM | POA: Insufficient documentation

## 2016-08-13 DIAGNOSIS — K8010 Calculus of gallbladder with chronic cholecystitis without obstruction: Secondary | ICD-10-CM | POA: Diagnosis not present

## 2016-08-13 DIAGNOSIS — E119 Type 2 diabetes mellitus without complications: Secondary | ICD-10-CM | POA: Diagnosis not present

## 2016-08-13 DIAGNOSIS — Z882 Allergy status to sulfonamides status: Secondary | ICD-10-CM | POA: Diagnosis not present

## 2016-08-13 DIAGNOSIS — R011 Cardiac murmur, unspecified: Secondary | ICD-10-CM | POA: Insufficient documentation

## 2016-08-13 DIAGNOSIS — K811 Chronic cholecystitis: Secondary | ICD-10-CM | POA: Diagnosis present

## 2016-08-13 HISTORY — PX: CHOLECYSTECTOMY: SHX55

## 2016-08-13 LAB — GLUCOSE, CAPILLARY: GLUCOSE-CAPILLARY: 97 mg/dL (ref 65–99)

## 2016-08-13 LAB — POCT PREGNANCY, URINE: Preg Test, Ur: NEGATIVE

## 2016-08-13 SURGERY — LAPAROSCOPIC CHOLECYSTECTOMY
Anesthesia: General | Wound class: Clean Contaminated

## 2016-08-13 MED ORDER — ONDANSETRON HCL 4 MG/2ML IJ SOLN
4.0000 mg | Freq: Once | INTRAMUSCULAR | Status: AC | PRN
  Administered 2016-08-13: 4 mg via INTRAVENOUS

## 2016-08-13 MED ORDER — CEFAZOLIN SODIUM-DEXTROSE 2-4 GM/100ML-% IV SOLN
INTRAVENOUS | Status: AC
  Administered 2016-08-13: 2 g via INTRAVENOUS
  Filled 2016-08-13: qty 100

## 2016-08-13 MED ORDER — ACETAMINOPHEN 10 MG/ML IV SOLN
INTRAVENOUS | Status: DC | PRN
  Administered 2016-08-13: 1000 mg via INTRAVENOUS

## 2016-08-13 MED ORDER — SODIUM CHLORIDE 0.9 % IJ SOLN
INTRAMUSCULAR | Status: DC | PRN
  Administered 2016-08-13: 20 mL

## 2016-08-13 MED ORDER — MIDAZOLAM HCL 2 MG/2ML IJ SOLN
INTRAMUSCULAR | Status: DC | PRN
  Administered 2016-08-13 (×2): 2 mg via INTRAVENOUS

## 2016-08-13 MED ORDER — FENTANYL CITRATE (PF) 100 MCG/2ML IJ SOLN
INTRAMUSCULAR | Status: AC
  Filled 2016-08-13: qty 2

## 2016-08-13 MED ORDER — BUPIVACAINE-EPINEPHRINE (PF) 0.25% -1:200000 IJ SOLN
INTRAMUSCULAR | Status: AC
  Filled 2016-08-13: qty 30

## 2016-08-13 MED ORDER — SODIUM CHLORIDE 0.9 % IJ SOLN
INTRAMUSCULAR | Status: AC
  Filled 2016-08-13: qty 50

## 2016-08-13 MED ORDER — LACTATED RINGERS IV SOLN
INTRAVENOUS | Status: DC | PRN
  Administered 2016-08-13: 15:00:00 via INTRAVENOUS

## 2016-08-13 MED ORDER — OXYCODONE HCL 5 MG PO TABS
ORAL_TABLET | ORAL | Status: AC
  Filled 2016-08-13: qty 1

## 2016-08-13 MED ORDER — OXYCODONE HCL 5 MG PO TABS
ORAL_TABLET | ORAL | Status: AC
  Filled 2016-08-13: qty 2

## 2016-08-13 MED ORDER — IOPAMIDOL (ISOVUE-300) INJECTION 61%
INTRAVENOUS | Status: DC | PRN
  Administered 2016-08-13: 37 mL

## 2016-08-13 MED ORDER — ACETAMINOPHEN 10 MG/ML IV SOLN
INTRAVENOUS | Status: AC
  Filled 2016-08-13: qty 100

## 2016-08-13 MED ORDER — FAMOTIDINE 20 MG PO TABS
ORAL_TABLET | ORAL | Status: AC
  Administered 2016-08-13: 20 mg via ORAL
  Filled 2016-08-13: qty 1

## 2016-08-13 MED ORDER — ONDANSETRON HCL 4 MG/2ML IJ SOLN
INTRAMUSCULAR | Status: DC | PRN
  Administered 2016-08-13: 4 mg via INTRAVENOUS

## 2016-08-13 MED ORDER — SODIUM CHLORIDE 0.9 % IV SOLN
INTRAVENOUS | Status: DC
  Administered 2016-08-13: 12:00:00 via INTRAVENOUS

## 2016-08-13 MED ORDER — SUGAMMADEX SODIUM 200 MG/2ML IV SOLN
INTRAVENOUS | Status: DC | PRN
  Administered 2016-08-13: 200 mg via INTRAVENOUS

## 2016-08-13 MED ORDER — ONDANSETRON HCL 4 MG/2ML IJ SOLN
INTRAMUSCULAR | Status: AC
  Administered 2016-08-13: 4 mg via INTRAVENOUS
  Filled 2016-08-13: qty 2

## 2016-08-13 MED ORDER — FENTANYL CITRATE (PF) 100 MCG/2ML IJ SOLN
INTRAMUSCULAR | Status: DC | PRN
  Administered 2016-08-13 (×2): 50 ug via INTRAVENOUS

## 2016-08-13 MED ORDER — CEFAZOLIN SODIUM-DEXTROSE 2-4 GM/100ML-% IV SOLN
2.0000 g | INTRAVENOUS | Status: AC
  Administered 2016-08-13: 2 g via INTRAVENOUS

## 2016-08-13 MED ORDER — SODIUM CHLORIDE 0.9 % IR SOLN
Status: DC | PRN
  Administered 2016-08-13: 300 mL

## 2016-08-13 MED ORDER — OXYCODONE HCL 5 MG PO TABS: 10.0000 mg | ORAL_TABLET | ORAL | 0 refills | Status: DC | PRN

## 2016-08-13 MED ORDER — DEXAMETHASONE SODIUM PHOSPHATE 10 MG/ML IJ SOLN
INTRAMUSCULAR | Status: DC | PRN
  Administered 2016-08-13: 4 mg via INTRAVENOUS

## 2016-08-13 MED ORDER — SUCCINYLCHOLINE CHLORIDE 20 MG/ML IJ SOLN
INTRAMUSCULAR | Status: DC | PRN
  Administered 2016-08-13: 100 mg via INTRAVENOUS

## 2016-08-13 MED ORDER — FENTANYL CITRATE (PF) 100 MCG/2ML IJ SOLN
25.0000 ug | INTRAMUSCULAR | Status: AC | PRN
  Administered 2016-08-13 (×6): 25 ug via INTRAVENOUS

## 2016-08-13 MED ORDER — OXYCODONE HCL 5 MG PO TABS
10.0000 mg | ORAL_TABLET | ORAL | Status: DC | PRN
  Administered 2016-08-13: 10 mg via ORAL
  Filled 2016-08-13: qty 2

## 2016-08-13 MED ORDER — PROPOFOL 10 MG/ML IV BOLUS
INTRAVENOUS | Status: DC | PRN
  Administered 2016-08-13: 200 mg via INTRAVENOUS

## 2016-08-13 MED ORDER — CHLORHEXIDINE GLUCONATE CLOTH 2 % EX PADS
6.0000 | MEDICATED_PAD | Freq: Once | CUTANEOUS | Status: AC
  Administered 2016-08-13: 6 via TOPICAL

## 2016-08-13 MED ORDER — BUPIVACAINE-EPINEPHRINE 0.25% -1:200000 IJ SOLN
INTRAMUSCULAR | Status: DC | PRN
  Administered 2016-08-13 (×2): 30 mL

## 2016-08-13 MED ORDER — LIDOCAINE HCL (PF) 1 % IJ SOLN
INTRAMUSCULAR | Status: AC
  Administered 2016-08-13: 2 mL
  Filled 2016-08-13: qty 2

## 2016-08-13 MED ORDER — FAMOTIDINE 20 MG PO TABS
20.0000 mg | ORAL_TABLET | Freq: Once | ORAL | Status: AC
  Administered 2016-08-13: 20 mg via ORAL

## 2016-08-13 MED ORDER — LIDOCAINE HCL (CARDIAC) 20 MG/ML IV SOLN
INTRAVENOUS | Status: DC | PRN
  Administered 2016-08-13: 100 mg via INTRAVENOUS

## 2016-08-13 MED ORDER — ROCURONIUM BROMIDE 100 MG/10ML IV SOLN
INTRAVENOUS | Status: DC | PRN
  Administered 2016-08-13: 10 mg via INTRAVENOUS
  Administered 2016-08-13: 5 mg via INTRAVENOUS
  Administered 2016-08-13: 10 mg via INTRAVENOUS
  Administered 2016-08-13: 35 mg via INTRAVENOUS

## 2016-08-13 MED ORDER — EPHEDRINE SULFATE 50 MG/ML IJ SOLN
INTRAMUSCULAR | Status: DC | PRN
  Administered 2016-08-13: 15 mg via INTRAVENOUS

## 2016-08-13 MED ORDER — CHLORHEXIDINE GLUCONATE CLOTH 2 % EX PADS: 6.0000 | MEDICATED_PAD | Freq: Once | CUTANEOUS | Status: DC

## 2016-08-13 SURGICAL SUPPLY — 49 items
APPLICATOR COTTON TIP 6IN STRL (MISCELLANEOUS) ×3 IMPLANT
APPLIER CLIP 5 13 M/L LIGAMAX5 (MISCELLANEOUS) ×3
APR CLP MED LRG 5 ANG JAW (MISCELLANEOUS) ×1
BLADE SURG 15 STRL LF DISP TIS (BLADE) ×1 IMPLANT
BLADE SURG 15 STRL SS (BLADE) ×3
CANISTER SUCT 1200ML W/VALVE (MISCELLANEOUS) ×3 IMPLANT
CHLORAPREP W/TINT 26ML (MISCELLANEOUS) ×3 IMPLANT
CHOLANGIOGRAM CATH TAUT (CATHETERS) ×2 IMPLANT
CLEANER CAUTERY TIP 5X5 PAD (MISCELLANEOUS) ×1 IMPLANT
CLIP APPLIE 5 13 M/L LIGAMAX5 (MISCELLANEOUS) ×1 IMPLANT
DECANTER SPIKE VIAL GLASS SM (MISCELLANEOUS) ×6 IMPLANT
DEVICE TROCAR PUNCTURE CLOSURE (ENDOMECHANICALS) IMPLANT
DRAPE C-ARM XRAY 36X54 (DRAPES) ×3 IMPLANT
DRAPE SHEET LG 3/4 BI-LAMINATE (DRAPES) ×2 IMPLANT
ELECT REM PT RETURN 9FT ADLT (ELECTROSURGICAL) ×3
ELECTRODE REM PT RTRN 9FT ADLT (ELECTROSURGICAL) ×1 IMPLANT
ENDOLOOP SUT PDS II  0 18 (SUTURE) ×2
ENDOLOOP SUT PDS II 0 18 (SUTURE) IMPLANT
ENDOPOUCH RETRIEVER 10 (MISCELLANEOUS) ×3 IMPLANT
GLOVE BIO SURGEON STRL SZ7 (GLOVE) ×3 IMPLANT
GOWN STRL REUS W/ TWL LRG LVL3 (GOWN DISPOSABLE) ×3 IMPLANT
GOWN STRL REUS W/TWL LRG LVL3 (GOWN DISPOSABLE) ×9
IRRIGATION STRYKERFLOW (MISCELLANEOUS) ×1 IMPLANT
IRRIGATOR STRYKERFLOW (MISCELLANEOUS) ×3
IV CATH ANGIO 12GX3 LT BLUE (NEEDLE) ×3 IMPLANT
IV NS 1000ML (IV SOLUTION) ×3
IV NS 1000ML BAXH (IV SOLUTION) ×1 IMPLANT
L-HOOK LAP DISP 36CM (ELECTROSURGICAL) ×3
LHOOK LAP DISP 36CM (ELECTROSURGICAL) ×1 IMPLANT
LIQUID BAND (GAUZE/BANDAGES/DRESSINGS) ×3 IMPLANT
NEEDLE HYPO 22GX1.5 SAFETY (NEEDLE) ×3 IMPLANT
PACK LAP CHOLECYSTECTOMY (MISCELLANEOUS) ×3 IMPLANT
PAD CLEANER CAUTERY TIP 5X5 (MISCELLANEOUS) ×2
PENCIL ELECTRO HAND CTR (MISCELLANEOUS) ×3 IMPLANT
SCISSORS METZENBAUM CVD 33 (INSTRUMENTS) ×3 IMPLANT
SLEEVE ENDOPATH XCEL 5M (ENDOMECHANICALS) ×6 IMPLANT
SOL ANTI-FOG 6CC FOG-OUT (MISCELLANEOUS) ×1 IMPLANT
SOL FOG-OUT ANTI-FOG 6CC (MISCELLANEOUS) ×2
SPONGE LAP 18X18 5 PK (GAUZE/BANDAGES/DRESSINGS) ×3 IMPLANT
STOPCOCK 3 WAY  REPLAC (MISCELLANEOUS) ×2 IMPLANT
SUT ETHIBOND 0 MO6 C/R (SUTURE) IMPLANT
SUT MNCRL AB 4-0 PS2 18 (SUTURE) ×3 IMPLANT
SUT VIC AB 0 CT2 27 (SUTURE) IMPLANT
SUT VICRYL 0 AB UR-6 (SUTURE) ×6 IMPLANT
SYR 20CC LL (SYRINGE) ×3 IMPLANT
TROCAR XCEL BLUNT TIP 100MML (ENDOMECHANICALS) ×3 IMPLANT
TROCAR XCEL NON-BLD 5MMX100MML (ENDOMECHANICALS) ×3 IMPLANT
TUBING INSUFFLATOR HI FLOW (MISCELLANEOUS) ×3 IMPLANT
WATER STERILE IRR 1000ML POUR (IV SOLUTION) ×3 IMPLANT

## 2016-08-13 NOTE — Discharge Instructions (Signed)

## 2016-08-13 NOTE — OR Nursing (Signed)
Upon arrival to post-op, pt c/o nausea and pain of 6/10 pain.  Zofran given IVP.

## 2016-08-13 NOTE — Transfer of Care (Signed)
Immediate Anesthesia Transfer of Care Note  Patient: Faith [name]  Procedure(s) Performed: Procedure(s): LAPAROSCOPIC CHOLECYSTECTOMY (N/A)  Patient Location: PACU  Anesthesia Type:General  Level of Consciousness: awake  Airway & Oxygen Therapy: Patient Spontanous Breathing and Patient connected to face mask oxygen  Post-op Assessment: Report given to RN and Post -op Vital signs reviewed and stable  Post vital signs: Reviewed and stable  Last Vitals:  Vitals:   08/13/16 1124  BP: 120/70  Pulse: 88  Resp: 16  Temp: 36.7 C    Last Pain:  Vitals:   08/13/16 1124  TempSrc: Oral  PainSc: 3          Complications: No apparent anesthesia complications

## 2016-08-13 NOTE — Op Note (Addendum)
Laparoscopic Cholecystectomy w IOC  Pre-operative Diagnosis: Chronic Cholecystitis  Post-operative Diagnosis: Same  Procedure: 1. Extensive Lysis of adhesions taking at least 40 minutes of operative time                      2. Laparoscopic cholecystectomy with intraoperative cholangiogram  Surgeon: Sterling Bigiego Naraly Fritcher, MD FACS  Anesthesia: Gen. with endotracheal tube   Findings: Chronic Cholecystitis  Difficult case due to inflammatory response and because of a stone stuck at the cystic duct/CBD junction  Estimated Blood Loss: 50cc         Drains: none         Specimens: Gallbladder           Complications: none   Procedure Details  The patient was seen again in the Holding Room. The benefits, complications, treatment options, and expected outcomes were discussed with the patient. The risks of bleeding, infection, recurrence of symptoms, failure to resolve symptoms, bile duct damage, bile duct leak, retained common bile duct stone, bowel injury, any of which could require further surgery and/or ERCP, stent, or papillotomy were reviewed with the patient. The likelihood of improving the patient's symptoms with return to their baseline status is good.  The patient and/or family concurred with the proposed plan, giving informed consent.  The patient was taken to Operating Room, identified as Faith [name] and the procedure verified as Laparoscopic Cholecystectomy.  A Time Out was held and the above information confirmed.  Prior to the induction of general anesthesia, antibiotic prophylaxis was administered. VTE prophylaxis was in place. General endotracheal anesthesia was then administered and tolerated well. After the induction, the abdomen was prepped with Chloraprep and draped in the sterile fashion. The patient was positioned in the supine position.  Local anesthetic  was injected into the skin near the umbilicus and an incision made. Cut down technique was used to enter the abdominal  cavity and a Hasson trochar was placed after two vicryl stitches were anchored to the fascia. Pneumoperitoneum was then created with CO2 and tolerated well without any adverse changes in the patient's vital signs.  Three 5-mm ports were placed in the right upper quadrant all under direct vision. All skin incisions  were infiltrated with a local anesthetic agent before making the incision and placing the trocars.   The patient was positioned  in reverse Trendelenburg, tilted slightly to the patient's left.  The gallbladder was identified, the fundus grasped and retracted cephalad. The omentum was stuck and adhere to the GB,  Adhesions were lysed bluntly.  The infundibulum was grasped and retracted laterally, exposing the peritoneum overlying the triangle of Calot. There was significant inflammation around the GB and we were very careful dissecting the fundus from adjacent structure. This was then divided and exposed in a blunt fashion. An extended critical view of the cystic duct and cystic artery was obtained.  The cystic duct was clearly identified and bluntly dissected.  The cystic duct was enlarged and because of this I decided to do a cholangiogram. A ductotomy was performed and the  Catheter inserted, contrast was injected but there was a stone distally blocking the cystic duct,contrast was unable to reach the CBD because of this and there was extravasation retrograde. I tried to performed it again and tried to milk the stone to the ductotomy but was uable to do so. Because of the proximity to the CBD I was not too aggressive with the dissection around the cystic duct and CBD  junction. The catheter was removed and the   Artery was double clipped and divided, an endoloop PDS was used to tied the cystic duct because of its diameter  The gallbladder was taken from the gallbladder fossa in a retrograde fashion with the electrocautery. The gallbladder was removed and placed in an Endocatch bag. The liver bed  was irrigated and inspected. Hemostasis was achieved with the electrocautery. Copious irrigation was utilized and was repeatedly aspirated until clear.  The gallbladder and Endocatch sac were then removed through the epigastric port site.   Inspection of the right upper quadrant was performed. No bleeding, bile duct injury or leak, or bowel injury was noted. Pneumoperitoneum was released.  The periumbilical port site was closed with figure-of-eight 0 Vicryl sutures. 4-0 subcuticular Monocryl was used to close the skin. Dermabond was  applied.  The patient was then extubated and brought to the recovery room in stable condition. Sponge, lap, and needle counts were correct at closure and at the conclusion of the case.               Sterling Bigiego Katelyne Galster, MD, FACS

## 2016-08-13 NOTE — Anesthesia Preprocedure Evaluation (Signed)
Anesthesia Evaluation  Patient identified by MRN, date of birth, ID band Patient awake    Reviewed: Allergy & Precautions, NPO status , Patient's Chart, lab work & pertinent test results  History of Anesthesia Complications (+) history of anesthetic complications  Airway Mallampati: IV     Mouth opening: Limited Mouth Opening  Dental   Pulmonary asthma , Current Smoker,           Cardiovascular negative cardio ROS       Neuro/Psych Seizures -, Well Controlled,  Depression Bipolar Disorder    GI/Hepatic Neg liver ROS, GERD  Medicated and Poorly Controlled,  Endo/Other  diabetes, Type 2, Oral Hypoglycemic Agents  Renal/GU negative Renal ROS     Musculoskeletal   Abdominal   Peds  Hematology  (+) anemia ,   Anesthesia Other Findings   Reproductive/Obstetrics                             Anesthesia Physical Anesthesia Plan  ASA: III  Anesthesia Plan: General   Post-op Pain Management:    Induction: Intravenous  Airway Management Planned: Oral ETT  Additional Equipment:   Intra-op Plan:   Post-operative Plan:   Informed Consent: I have reviewed the patients History and Physical, chart, labs and discussed the procedure including the risks, benefits and alternatives for the proposed anesthesia with the patient or authorized representative who has indicated his/her understanding and acceptance.     Plan Discussed with:   Anesthesia Plan Comments:         Anesthesia Quick Evaluation

## 2016-08-13 NOTE — Anesthesia Procedure Notes (Signed)
Procedure Name: Intubation Date/Time: 08/13/2016 1:52 PM Performed by: Marlana SalvageJESSUP, Faith Hagadorn Pre-anesthesia Checklist: Patient identified, Emergency Drugs available, Suction available, Patient being monitored and Timeout performed Patient Re-evaluated:Patient Re-evaluated prior to inductionOxygen Delivery Method: Circle system utilized Preoxygenation: Pre-oxygenation with 100% oxygen Intubation Type: IV induction Ventilation: Mask ventilation without difficulty Laryngoscope Size: Mac and 3 Grade View: Grade I Tube type: Oral Tube size: 7.0 mm Number of attempts: 1 Airway Equipment and Method: Stylet Placement Confirmation: ETT inserted through vocal cords under direct vision,  positive ETCO2 and breath sounds checked- equal and bilateral Secured at: 22 cm Tube secured with: Tape Dental Injury: Teeth and Oropharynx as per pre-operative assessment

## 2016-08-13 NOTE — Interval H&P Note (Signed)
History and Physical Interval Note:  08/13/2016 11:26 AM  Ardine BjorkAshley M Shadduck  has presented today for surgery, with the diagnosis of gallstones  The various methods of treatment have been discussed with the patient and family. After consideration of risks, benefits and other options for treatment, the patient has consented to  Procedure(s): LAPAROSCOPIC CHOLECYSTECTOMY (N/A) as a surgical intervention .  The patient's history has been reviewed, patient examined, no change in status, stable for surgery.  I have reviewed the patient's chart and labs.  Questions were answered to the patient's satisfaction.     Rasa Degrazia F Nalee Lightle

## 2016-08-13 NOTE — H&P (View-Only) (Signed)
Patient ID: Faith [name], female   DOB: 12-28-1990, 25 y.o.   MRN: [id number]  HPI Faith [name] is a 25 y.o. female seen for biliary colic. She has multiple comorbidities including anxiety disorder, depression, bipolar disorder, diabetes, seizure disorder. Presented to the emergency room complaining of right upper quadrant pain associated with nausea and vomiting. No fevers or chills no evidence of biliary Obstruction. Ultrasound personally reviewed there is evidence of gallstones, and normal common bile duct. No evidence of biliary dilation. No evidence of cholecystitis. LFTs are normal. Of note she had a C-section a month ago.  HPI  Past Medical History:  Diagnosis Date  . Asthma   . Depression   . Diabetes mellitus without complication ([place])   . Gallstones   . Heart murmur   . Migraines   . Ovarian cyst   . Seizures ([place])   . Tubal pregnancy     Past Surgical History:  Procedure Laterality Date  . CESAREAN SECTION N/A 06/22/2016   Procedure: CESAREAN SECTION;  Surgeon: Malachy Mood, MD;  Location: [facility] ORS;  Service: Obstetrics;  Laterality: N/A;  . DILATION AND CURETTAGE OF UTERUS    . ovarian cyst removed    . SALPINGECTOMY Right 2013   ectopic pregnancy  . TONSILLECTOMY    . TONSILLECTOMY AND ADENOIDECTOMY      Family History  Problem Relation Age of Onset  . Heart disease Father   . Huntington's disease Mother     Social History Social History  Substance Use Topics  . Smoking status: Current Every Day Smoker    Packs/day: 0.25    Years: 3.00    Types: Cigarettes  . Smokeless tobacco: Never Used  . Alcohol use No    Allergies  Allergen Reactions  . Ketorolac Anaphylaxis, Hives and Swelling  . Morphine Other (See Comments)    bradycradia bradycradia  . Morphine And Related Other (See Comments)    bradycardia  . Sulfasalazine Hives  . Tomato Anaphylaxis  . Toradol [Ketorolac Tromethamine] Anaphylaxis, Hives and Swelling  . Dilaudid [Hydromorphone  Hcl] Hives  . Latex Rash  . Sulfa Antibiotics Hives  . Tramadol Swelling, Rash and Hives  . Hydrocodone-Acetaminophen Other (See Comments)  . Iohexol Other (See Comments)    Pt. States she can not take iohexol but won't state reaction type. Pt. States she can not take iohexol but won't state reaction type. Pt. States she can not take iohexol but won't state reaction type.  . Lamictal [Lamotrigine] Other (See Comments)  . Sulfur Other (See Comments)  . Mushroom Extract Complex Rash    Current Outpatient Prescriptions  Medication Sig Dispense Refill  . albuterol (PROVENTIL HFA;VENTOLIN HFA) 108 (90 BASE) MCG/ACT inhaler Inhale 2 puffs into the lungs every 6 (six) hours as needed. For shortness of breath    . amphetamine-dextroamphetamine (ADDERALL) 10 MG tablet Take 10 mg by mouth daily with breakfast.    . blood glucose meter kit and supplies KIT Dispense based on patient and insurance preference. Use up to four times daily as directed. (FOR ICD-9 250.00, 250.01). 1 each 0  . clonazePAM (KLONOPIN) 1 MG tablet Take 1 mg by mouth 2 (two) times daily.    . ferrous sulfate (FERROUSUL) 325 (65 FE) MG tablet Take 1 tablet (325 mg total) by mouth 2 (two) times daily. 60 tablet 1  . FLUoxetine (PROZAC) 20 MG tablet Take 20 mg by mouth daily.    . folic acid (FOLVITE) 1 MG tablet Take 3 mg  by mouth daily.    Marland Kitchen ibuprofen (ADVIL,MOTRIN) 600 MG tablet Take 1 tablet (600 mg total) by mouth every 6 (six) hours. 30 tablet 0  . Lacosamide 100 MG TABS Take 1 tablet by mouth 2 (two) times daily. Reported on 01/20/2016    . metFORMIN (GLUCOPHAGE) 500 MG tablet Take 1,500 mg by mouth daily with breakfast.    . oxyCODONE-acetaminophen (PERCOCET/ROXICET) 5-325 MG tablet Take 1-2 tablets by mouth every 3 (three) hours as needed for moderate pain. 30 tablet 0  . ranitidine (ZANTAC) 150 MG tablet Take 1 tablet (150 mg total) by mouth at bedtime. 30 tablet 1  . gabapentin (NEURONTIN) 300 MG capsule Take 1 capsule  (300 mg total) by mouth 3 (three) times daily. 90 capsule 1   No current facility-administered medications for this visit.      Review of Systems A 10 point review of systems was asked and was negative except for the information on the HPI  Physical Exam Blood pressure 122/65, pulse 82, temperature 98.3 F (36.8 C), temperature source Oral, height 5' (1.524 m), weight 96.2 kg (212 lb), unknown if currently breastfeeding. CONSTITUTIONAL: NAD Obese EYES: Pupils are equal, round, and reactive to light, Sclera are non-icteric. EARS, NOSE, MOUTH AND THROAT: The oropharynx is clear. The oral mucosa is pink and moist. Hearing is intact to voice. LYMPH NODES:  Lymph nodes in the neck are normal. RESPIRATORY:  Lungs are clear. There is normal respiratory effort, with equal breath sounds bilaterally, and without pathologic use of accessory muscles. There is chest wall tenderness on the right side CARDIOVASCULAR: Heart is regular without murmurs, gallops, or rubs. GI: The abdomen is soft,mild ttp RUQ, no peritonitis no murphy. C section scar well healed. GU: Rectal deferred.   MUSCULOSKELETAL: Normal muscle strength and tone. No cyanosis or edema.   SKIN: Turgor is good and there are no pathologic skin lesions or ulcers. NEUROLOGIC: Motor and sensation is grossly normal. Cranial nerves are grossly intact. PSYCH:  Oriented to person, place and time. Affect is normal.  Data Reviewed I have personally reviewed the patient's imaging, laboratory findings and medical records.    Assessment Plan 25 year old female with symptomatic cholelithiasis. At this point there is no need for emergent surgical revision. Discussed with the patient in detail about her disease process. 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 I do think that some of her symptoms are related to biliary in nature and I do think that she will benefit from laparoscopic  cholecystectomy.The risks, benefits, complications, treatment options, and expected outcomes were discussed with the patient. The possibilities of bleeding, recurrent infection, finding a normal gallbladder, perforation of viscus organs, damage to surrounding structures, bile leak, abscess formation, needing a drain placed, the need for additional procedures, reaction to medication, pulmonary aspiration,  failure to diagnose a condition, the possible need to convert to an open procedure, and creating a complication requiring transfusion or operation were discussed with the patient. The patient and/or family concurred with the proposed plan, giving informed consent.  We'll plan for laparoscopic cholecystectomy in about 3 weeks that will give [place] a little bit more time for  the uterus to shrink further and for  any potential adhesions to improve. I will also provide gabapentin to help with her chest wall pain ( neuralgia)  Caroleen Hamman, MD FACS General Surgeon 07/22/2016, 10:22 AM

## 2016-08-14 ENCOUNTER — Encounter: Payer: Self-pay | Admitting: Surgery

## 2016-08-14 NOTE — Anesthesia Postprocedure Evaluation (Signed)
Anesthesia Post Note  Patient: Faith [name]  Procedure(s) Performed: Procedure(s) (LRB): LAPAROSCOPIC CHOLECYSTECTOMY (N/A)  Patient location during evaluation: PACU Anesthesia Type: General Level of consciousness: awake and alert Pain management: pain level controlled Vital Signs Assessment: post-procedure vital signs reviewed and stable Respiratory status: spontaneous breathing, nonlabored ventilation and respiratory function stable Cardiovascular status: blood pressure returned to baseline and stable Postop Assessment: no signs of nausea or vomiting Anesthetic complications: no    Last Vitals:  Vitals:   08/13/16 1717 08/13/16 1913  BP: 121/61 122/67  Pulse: 89 (!) 58  Resp: 20 16  Temp: 36.6 C     Last Pain:  Vitals:   08/13/16 1913  TempSrc:   PainSc: 3                  Shlok Raz

## 2016-08-17 LAB — SURGICAL PATHOLOGY

## 2016-08-20 ENCOUNTER — Encounter: Payer: Self-pay | Admitting: Emergency Medicine

## 2016-08-20 DIAGNOSIS — F17210 Nicotine dependence, cigarettes, uncomplicated: Secondary | ICD-10-CM | POA: Diagnosis not present

## 2016-08-20 DIAGNOSIS — Z79899 Other long term (current) drug therapy: Secondary | ICD-10-CM | POA: Diagnosis not present

## 2016-08-20 DIAGNOSIS — N3000 Acute cystitis without hematuria: Secondary | ICD-10-CM | POA: Diagnosis not present

## 2016-08-20 DIAGNOSIS — E119 Type 2 diabetes mellitus without complications: Secondary | ICD-10-CM | POA: Insufficient documentation

## 2016-08-20 DIAGNOSIS — Z91040 Latex allergy status: Secondary | ICD-10-CM | POA: Diagnosis not present

## 2016-08-20 DIAGNOSIS — J45909 Unspecified asthma, uncomplicated: Secondary | ICD-10-CM | POA: Diagnosis not present

## 2016-08-20 DIAGNOSIS — Z7984 Long term (current) use of oral hypoglycemic drugs: Secondary | ICD-10-CM | POA: Diagnosis not present

## 2016-08-20 DIAGNOSIS — N12 Tubulo-interstitial nephritis, not specified as acute or chronic: Secondary | ICD-10-CM | POA: Diagnosis not present

## 2016-08-20 DIAGNOSIS — F909 Attention-deficit hyperactivity disorder, unspecified type: Secondary | ICD-10-CM | POA: Insufficient documentation

## 2016-08-20 DIAGNOSIS — R1011 Right upper quadrant pain: Secondary | ICD-10-CM | POA: Diagnosis present

## 2016-08-20 NOTE — ED Triage Notes (Addendum)
Pt had gallbladder removed last Thursday and since surgery has had bilat rib pain. Also co incision not healing well, states noted that today. Incisions look to be healing well with no signs of infection. Pt denies any abd pain or new pain.

## 2016-08-21 ENCOUNTER — Telehealth: Payer: Self-pay | Admitting: Surgery

## 2016-08-21 ENCOUNTER — Emergency Department
Admission: EM | Admit: 2016-08-21 | Discharge: 2016-08-21 | Disposition: A | Discharge status: Home / Self Care | Payer: Medicaid Other | Attending: Emergency Medicine | Admitting: Emergency Medicine

## 2016-08-21 ENCOUNTER — Emergency Department: Payer: Medicaid Other

## 2016-08-21 DIAGNOSIS — N3000 Acute cystitis without hematuria: Secondary | ICD-10-CM

## 2016-08-21 DIAGNOSIS — R1011 Right upper quadrant pain: Secondary | ICD-10-CM

## 2016-08-21 LAB — COMPREHENSIVE METABOLIC PANEL
ALBUMIN: 3.7 g/dL (ref 3.5–5.0)
ALK PHOS: 92 U/L (ref 38–126)
ALT: 54 U/L (ref 14–54)
ANION GAP: 7 (ref 5–15)
AST: 30 U/L (ref 15–41)
BILIRUBIN TOTAL: 0.5 mg/dL (ref 0.3–1.2)
BUN: 10 mg/dL (ref 6–20)
CALCIUM: 8.9 mg/dL (ref 8.9–10.3)
CO2: 27 mmol/L (ref 22–32)
CREATININE: 0.7 mg/dL (ref 0.44–1.00)
Chloride: 104 mmol/L (ref 101–111)
GFR calc non Af Amer: >60 mL/min (ref >60)
GLUCOSE: 99 mg/dL (ref 65–99)
Potassium: 4 mmol/L (ref 3.5–5.1)
SODIUM: 138 mmol/L (ref 135–145)
TOTAL PROTEIN: 7.1 g/dL (ref 6.5–8.1)

## 2016-08-21 LAB — URINALYSIS COMPLETE WITH MICROSCOPIC (ARMC ONLY)
BACTERIA UA: NONE SEEN
BILIRUBIN URINE: NEGATIVE
GLUCOSE, UA: NEGATIVE mg/dL
Ketones, ur: NEGATIVE mg/dL
NITRITE: NEGATIVE
PH: 6 (ref 5.0–8.0)
Protein, ur: 30 mg/dL — AB
SPECIFIC GRAVITY, URINE: 1.023 (ref 1.005–1.030)

## 2016-08-21 LAB — CBC
HCT: 33.9 % — ABNORMAL LOW (ref 35.0–47.0)
Hemoglobin: 10.8 g/dL — ABNORMAL LOW (ref 12.0–16.0)
MCH: 21.7 pg — AB (ref 26.0–34.0)
MCHC: 31.8 g/dL — ABNORMAL LOW (ref 32.0–36.0)
MCV: 68.3 fL — AB (ref 80.0–100.0)
PLATELETS: 409 10*3/uL (ref 150–440)
RBC: 4.97 MIL/uL (ref 3.80–5.20)
RDW: 21.1 % — AB (ref 11.5–14.5)
WBC: 14.4 10*3/uL — AB (ref 3.6–11.0)

## 2016-08-21 MED ORDER — BACITRACIN ZINC 500 UNIT/GM EX OINT
TOPICAL_OINTMENT | CUTANEOUS | Status: AC
  Administered 2016-08-21: 1
  Filled 2016-08-21: qty 0.9

## 2016-08-21 MED ORDER — OXYCODONE-ACETAMINOPHEN 5-325 MG PO TABS
ORAL_TABLET | ORAL | Status: AC
  Administered 2016-08-21: 1 via ORAL
  Filled 2016-08-21: qty 1

## 2016-08-21 MED ORDER — CIPROFLOXACIN HCL 500 MG PO TABS
500.0000 mg | ORAL_TABLET | Freq: Once | ORAL | Status: AC
  Administered 2016-08-21: 500 mg via ORAL
  Filled 2016-08-21: qty 1

## 2016-08-21 MED ORDER — OXYCODONE-ACETAMINOPHEN 5-325 MG PO TABS
1.0000 | ORAL_TABLET | Freq: Once | ORAL | Status: AC
  Administered 2016-08-21: 1 via ORAL

## 2016-08-21 MED ORDER — CIPROFLOXACIN HCL 500 MG PO TABS: 500.0000 mg | ORAL_TABLET | Freq: Two times a day (BID) | ORAL | 0 refills | Status: AC

## 2016-08-21 NOTE — Telephone Encounter (Signed)
Spoke with patient at this time. She is requesting pain medication due to having pain underneath her ribcage. Patient went to ED and had recent imaging done. Please see notes.  I instructed patient to try  Ibuprofen or Tylenol for the pain since she will be coming in on Monday for a scheduled appointment but she would prefer narcotics. Placed call to [place] office at this time and spoke with Angie and she will get Dr.Loflin to look at the request since there isn't a doctor in [place] today.

## 2016-08-21 NOTE — ED Provider Notes (Signed)
[facility] Emergency Department Provider Note  ____________________________________________  Time seen: 1:53 AM  I have reviewed the triage vital signs and the nursing notes.   HISTORY  Chief Complaint Post-op Problem      HPI Faith [name] is a 25 y.o. female  with history of recent cholecystectomy performed 1 week ago returns to the emergency department with right upper quadrant/right flank pain. Patient denies any nausea no vomiting. Patient does admits to urinary frequency and urgency over no dysuria. Patient denies any fever or febrile on presentation temperature 98.1. Patient denies any constipation. Patient states that she was only given 5 oxycodone post surgery and has since "ran out"     Past Medical History:  Diagnosis Date  . Anemia    BLOOD TRANSFUSION AFTER C-SECTION ON 06-2016-2 UNITS  . Asthma    WELL CONTROLLED  . Depression   . Diabetes mellitus without complication ([facility])   . Gallstones   . GERD (gastroesophageal reflux disease)   . Heart murmur    AS A CHILD  . Migraines   . Ovarian cyst   . Seizures ([facility])    LAST SEIZURE 2016  . Tubal pregnancy     Patient Active Problem List   Diagnosis Date Noted  . Cholecystitis, acute   . Indication for care in labor or delivery 06/20/2016  . Suprapubic abdominal pain 04/07/2016  . Abdominal pain in pregnancy 03/15/2016  . Diabetes mellitus during pregnancy, antepartum 01/16/2016  . ADD (attention deficit disorder) 07/17/2015  . Affective bipolar disorder ([facility]) 07/17/2015  . Body mass index of 60 or higher 01/20/2015  . Post traumatic stress disorder (PTSD) 06/16/2013  . Depression 06/15/2013  . Suicidal ideation 06/15/2013  . Status epilepticus ([facility]) 06/14/2013  . Diabetes mellitus type 2 in obese ([facility]) 06/14/2013    Past Surgical History:  Procedure Laterality Date  . CESAREAN SECTION N/A 06/22/2016   Procedure: CESAREAN SECTION;  Surgeon: Vena Austria, MD;  Location:  [facility] ORS;  Service: Obstetrics;  Laterality: N/A;  . CHOLECYSTECTOMY N/A 08/13/2016   Procedure: LAPAROSCOPIC CHOLECYSTECTOMY;  Surgeon: Leafy Ro, MD;  Location: [facility] ORS;  Service: General;  Laterality: N/A;  . DILATION AND CURETTAGE OF UTERUS    . ovarian cyst removed    . SALPINGECTOMY Right 2013   ectopic pregnancy  . TONSILLECTOMY    . TONSILLECTOMY AND ADENOIDECTOMY      Current Outpatient Rx  . Order #: [id number] Class: Historical Med  . Order #: [id number] Class: Historical Med  . Order #: [id number] Class: Print  . Order #: [id number] Class: Historical Med  . Order #: [id number] Class: Print  . Order #: [id number] Class: Historical Med  . Order #: [id number] Class: Historical Med  . Order #: [id number] Class: Normal  . Order #: [id number] Class: Print  . Order #: [id number] Class: Historical Med  . Order #: [id number] Class: Print  . Order #: [id number] Class: Historical Med  . Order #: [id number] Class: Print  . Order #: [id number] Class: Print  . Order #: [id number] Class: Print    Allergies Morphine; Morphine and related; Sulfasalazine; Tomato; Toradol [ketorolac tromethamine]; Dilaudid [hydromorphone hcl]; Latex; Sulfa antibiotics; Tramadol; Hydrocodone-acetaminophen; Iohexol; Lamictal [lamotrigine]; and Mushroom extract complex  Family History  Problem Relation Age of Onset  . Heart disease Father   . Huntington's disease Mother     Social History Social History  Substance Use Topics  . Smoking status: Current Every Day Smoker    Packs/day: 0.25    Years: 3.00    Types: Cigarettes  . Smokeless tobacco:  Never Used  . Alcohol use No    Review of Systems  Constitutional: Negative for fever. Eyes: Negative for visual changes. ENT: Negative for sore throat. Cardiovascular: Negative for chest pain. Respiratory: Negative for shortness of breath. Gastrointestinal: Positive for right upper quadrant/right flank pain, vomiting and diarrhea. Genitourinary: Negative for  dysuria. Musculoskeletal: Negative for back pain. Skin: Negative for rash. Neurological: Negative for headaches, focal weakness or numbness.   10-point ROS otherwise negative.  ____________________________________________   PHYSICAL EXAM:  VITAL SIGNS: ED Triage Vitals  Enc Vitals Group     BP 08/20/16 2343 110/70     Pulse Rate 08/20/16 2342 97     Resp 08/20/16 2342 18     Temp 08/20/16 2342 98.1 F (36.7 C)     Temp Source 08/20/16 2342 Oral     SpO2 08/20/16 2342 98 %     Weight 08/20/16 2342 212 lb (96.2 kg)     Height 08/20/16 2342 5' (1.524 m)     Head Circumference --      Peak Flow --      Pain Score 08/20/16 2342 8     Pain Loc --      Pain Edu? --      Excl. in GC? --      Constitutional: Alert and oriented. Well appearing and in no distress. Eyes: Conjunctivae are normal. PERRL. Normal extraocular movements. ENT   Head: Normocephalic and atraumatic.   Nose: No congestion/rhinnorhea.   Mouth/Throat: Mucous membranes are moist.   Neck: No stridor. Hematological/Lymphatic/Immunilogical: No cervical lymphadenopathy. Cardiovascular: Normal rate, regular rhythm. Normal and symmetric distal pulses are present in all extremities. No murmurs, rubs, or gallops. Respiratory: Normal respiratory effort without tachypnea nor retractions. Breath sounds are clear and equal bilaterally. No wheezes/rales/rhonchi. Gastrointestinal: Right upper quadrant/right CVA discomfort No distention. There is no CVA tenderness. Genitourinary: deferred Musculoskeletal: Nontender with normal range of motion in all extremities. No joint effusions.  No lower extremity tenderness nor edema. Neurologic:  Normal speech and language. No gross focal neurologic deficits are appreciated. Speech is normal.  Skin:  Skin is warm, dry and intact. No rash noted. Psychiatric: Mood and affect are normal. Speech and behavior are normal. Patient exhibits appropriate insight and  judgment.  ____________________________________________    LABS (pertinent positives/negatives)  Labs Reviewed  URINALYSIS COMPLETEWITH MICROSCOPIC ([facility] ONLY) - Abnormal; Notable for the following:       Result Value   Color, Urine YELLOW (*)    APPearance HAZY (*)    Hgb urine dipstick 3+ (*)    Protein, ur 30 (*)    Leukocytes, UA 2+ (*)    Squamous Epithelial / LPF 6-30 (*)    All other components within normal limits  CBC - Abnormal; Notable for the following:    WBC 14.4 (*)    Hemoglobin 10.8 (*)    HCT 33.9 (*)    MCV 68.3 (*)    MCH 21.7 (*)    MCHC 31.8 (*)    RDW 21.1 (*)    All other components within normal limits  COMPREHENSIVE METABOLIC PANEL      RADIOLOGY  CLINICAL DATA:  Recent cholecystectomy. Right upper quadrant pain for 1 week.  EXAM: US ABDOMEN LIMITED - RIGHT UPPER QUADRANT  COMPARISON:  None.  FINDINGS: Gallbladder:  Unremarkable appearances of the cholecystectomy bed. No abnormal fluid collections in the gallbladder fossa.  Common bile duct:  Diameter: Normal, 5.0 mm.  Liver:  No focal lesion identified. Within  normal limits in parenchymal echogenicity.  IMPRESSION: Normal liver and bile ducts. No evidence of complication from the recent cholecystectomy.   Electronically Signed   By: Ellery Plunkaniel R Mitchell M.D.   On: 08/21/2016 03:55  ______________  Procedures    INITIAL IMPRESSION / ASSESSMENT AND PLAN / ED COURSE  Pertinent labs & imaging results that were available during my care of the patient were reviewed by me and considered in my medical decision making (see chart for details).  History of physical exam as well as laboratory data consistent with possible pyelonephritis as such patient was given ciprofloxacin will be prescribed same for home.  ____________________________________________   FINAL CLINICAL IMPRESSION(S) / ED DIAGNOSES  Final diagnoses:  RUQ pain  Acute cystitis without  hematuria  Pyelonephritis    Darci Currentandolph N Brown, MD 08/21/16 [phone]

## 2016-08-21 NOTE — Telephone Encounter (Signed)
[facility] [address] Patient stated that she had to go to the ER last night due to her side hurting. They would not give her any pain medication and told her it would have to come from the surgeon. She would like someone to call her back. She has an appointment on Monday

## 2016-08-21 NOTE — ED Notes (Signed)
ED Provider at bedside. 

## 2016-08-21 NOTE — ED Notes (Signed)
Pt discharged to home.  Family member driving.  Discharge instructions reviewed.  Verbalized understanding.  No questions or concerns at this time.  Teach back verified.  Pt in NAD.  No items left in ED.   

## 2016-08-21 NOTE — Telephone Encounter (Signed)
Returned phone call to patient. Patient states that he is RUQ abdominal radiating to her back. Having nausea, taking Zofran, and denies vomiting. Denies fever or chills. Last Bowel Movement was day prior to yesterday. Miralax twice daily but is only taking only 1/2 capful. Conversation took a significant amount of time as I could not understand patient well due to slurring words.  Advised patient to take Magnesium Citrate x 1 bottle and if still no bowel movement within 6 hours, she will need to do 2 fleets enemas back to back.  Upon looking into recent ER records, patient has Pyelonephritis and has been prescribed Cipro. Radiology exams and labs are reassuring that this does not appear to be from recent surgery. Discussed all information with Dr. Orvis BrillLoflin and she would like patient to take Tylenol and Ibuprofen for pain as well as continue her Cipro and take her bowel medications.  Patient is very upset that she is not being given any further narcotic pain medication and she states, "Well, I will talk to Dr. Cecelia Byarsiego about this on Monday." I did explain to patient that we would leave her appointment on the schedule to see Dr. Everlene FarrierPabon on Monday but if she develops a fever >100.5 or persistent nausea/vomiting, or severe back pain, she would need to be seen in the ED prior to this appointment for her Pyelonephritis. Patient verbalizes understanding of this.

## 2016-08-24 ENCOUNTER — Encounter: Payer: Self-pay | Admitting: Surgery

## 2016-08-24 ENCOUNTER — Ambulatory Visit (INDEPENDENT_AMBULATORY_CARE_PROVIDER_SITE_OTHER): Payer: Medicaid Other | Admitting: Surgery

## 2016-08-24 VITALS — BP 121/66 | HR 100 | Temp 98.1°F | Ht 60.0 in | Wt 218.0 lb

## 2016-08-24 DIAGNOSIS — Z09 Encounter for follow-up examination after completed treatment for conditions other than malignant neoplasm: Secondary | ICD-10-CM

## 2016-08-24 MED ORDER — CYCLOBENZAPRINE HCL 10 MG PO TABS: 10.0000 mg | ORAL_TABLET | Freq: Three times a day (TID) | ORAL | 0 refills | Status: DC | PRN

## 2016-08-24 NOTE — Progress Notes (Signed)
S/p lap chole , stuck Stone cystic duct, went to the ER for back pain and urine infection. U/S showed no evidence of complications related to chole nml biliary anatomy and no fluid collections. TB and alk phos were normal, more importantly she clinically feels fine, was able to have a BM and is taking PO well. No evidence of retained stone or cholangitis She c/o back pain  PE NAD No evidence of jaundice Abd: incisions healing well, no infection. 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

## 2016-08-24 NOTE — Patient Instructions (Signed)
We have sent your medication to your pharmacy. You will take this medication three times daily, every day.   We will see you back in the office as scheduled below.

## 2016-09-04 ENCOUNTER — Encounter: Payer: Self-pay | Admitting: Cardiothoracic Surgery

## 2016-09-04 ENCOUNTER — Telehealth: Payer: Self-pay

## 2016-09-04 NOTE — Telephone Encounter (Signed)
We received a request for medical records from the Disability Determination Services for the patient. This was faxed on 09/03/2016 and received confirmation. Please see Media.

## 2016-09-11 ENCOUNTER — Emergency Department: Payer: Medicaid Other

## 2016-09-11 ENCOUNTER — Emergency Department
Admission: EM | Admit: 2016-09-11 | Discharge: 2016-09-11 | Disposition: A | Discharge status: Home / Self Care | Payer: Medicaid Other | Attending: Emergency Medicine | Admitting: Emergency Medicine

## 2016-09-11 DIAGNOSIS — E119 Type 2 diabetes mellitus without complications: Secondary | ICD-10-CM | POA: Insufficient documentation

## 2016-09-11 DIAGNOSIS — J45909 Unspecified asthma, uncomplicated: Secondary | ICD-10-CM | POA: Insufficient documentation

## 2016-09-11 DIAGNOSIS — M7661 Achilles tendinitis, right leg: Secondary | ICD-10-CM | POA: Insufficient documentation

## 2016-09-11 DIAGNOSIS — M79661 Pain in right lower leg: Secondary | ICD-10-CM | POA: Diagnosis present

## 2016-09-11 DIAGNOSIS — Z79899 Other long term (current) drug therapy: Secondary | ICD-10-CM | POA: Insufficient documentation

## 2016-09-11 DIAGNOSIS — F17210 Nicotine dependence, cigarettes, uncomplicated: Secondary | ICD-10-CM | POA: Diagnosis not present

## 2016-09-11 LAB — CBC WITH DIFFERENTIAL/PLATELET
Basophils Absolute: 0.1 10*3/uL (ref 0–0.1)
Basophils Relative: 1 %
Eosinophils Absolute: 0.5 10*3/uL (ref 0–0.7)
Eosinophils Relative: 4 %
HEMATOCRIT: 35.7 % (ref 35.0–47.0)
HEMOGLOBIN: 11 g/dL — AB (ref 12.0–16.0)
LYMPHS PCT: 19 %
Lymphs Abs: 2.7 10*3/uL (ref 1.0–3.6)
MCH: 20.8 pg — AB (ref 26.0–34.0)
MCHC: 30.9 g/dL — ABNORMAL LOW (ref 32.0–36.0)
MCV: 67.4 fL — AB (ref 80.0–100.0)
Monocytes Absolute: 0.6 10*3/uL (ref 0.2–0.9)
Monocytes Relative: 4 %
NEUTROS ABS: 10.5 10*3/uL — AB (ref 1.4–6.5)
NEUTROS PCT: 72 %
PLATELETS: 436 10*3/uL (ref 150–440)
RBC: 5.3 MIL/uL — AB (ref 3.80–5.20)
RDW: 19.9 % — ABNORMAL HIGH (ref 11.5–14.5)
WBC: 14.4 10*3/uL — AB (ref 3.6–11.0)

## 2016-09-11 LAB — BASIC METABOLIC PANEL
ANION GAP: 7 (ref 5–15)
BUN: 10 mg/dL (ref 6–20)
CHLORIDE: 104 mmol/L (ref 101–111)
CO2: 25 mmol/L (ref 22–32)
Calcium: 9.1 mg/dL (ref 8.9–10.3)
Creatinine, Ser: 0.68 mg/dL (ref 0.44–1.00)
GFR calc Af Amer: >60 mL/min (ref >60)
Glucose, Bld: 105 mg/dL — ABNORMAL HIGH (ref 65–99)
POTASSIUM: 3.6 mmol/L (ref 3.5–5.1)
SODIUM: 136 mmol/L (ref 135–145)

## 2016-09-11 MED ORDER — IBUPROFEN 600 MG PO TABS: 600.0000 mg | ORAL_TABLET | Freq: Four times a day (QID) | ORAL | 0 refills | Status: DC | PRN

## 2016-09-11 NOTE — ED Provider Notes (Signed)
[facility] Emergency Department Provider Note  ____________________________________________  Time seen: Approximately 6:18 PM  I have reviewed the triage vital signs and the nursing notes.   HISTORY  Chief Complaint Leg Pain    HPI Faith [name] is a 25 y.o. female , NAD, presents to the emergency department with a 24-hour history of left calf pain. Patient states she had sudden onset of left calf pain that radiates into the ankle last night. States pain is somewhat improved with over-the-counter medications. Has not noted any redness, abnormal warmth, swelling or skin sores. Denies any fevers, chills or body aches. Has had no chest pain, shortness breath, hemoptysis, abdominal pain, nausea, vomiting. Denies injury, traumas or falls. Denies numbness, weakness, tingling. It is noted that the patient has had 2 surgical procedures in the last 3 months with the most recent approximately 4 weeks ago.   Past Medical History:  Diagnosis Date  . Anemia    BLOOD TRANSFUSION AFTER C-SECTION ON 06-2016-2 UNITS  . Asthma    WELL CONTROLLED  . Depression   . Diabetes mellitus without complication ([place])   . Gallstones   . GERD (gastroesophageal reflux disease)   . Heart murmur    AS A CHILD  . Migraines   . Ovarian cyst   . Seizures ([place])    LAST SEIZURE 2016  . Tubal pregnancy     Patient Active Problem List   Diagnosis Date Noted  . Cholecystitis, acute   . Indication for care in labor or delivery 06/20/2016  . Suprapubic abdominal pain 04/07/2016  . Abdominal pain in pregnancy 03/15/2016  . Diabetes mellitus during pregnancy, antepartum 01/16/2016  . ADD (attention deficit disorder) 07/17/2015  . Affective bipolar disorder ([place]) 07/17/2015  . Body mass index of 60 or higher 01/20/2015  . Post traumatic stress disorder (PTSD) 06/16/2013  . Depression 06/15/2013  . Suicidal ideation 06/15/2013  . Status epilepticus ([place]) 06/14/2013  . Diabetes mellitus  type 2 in obese ([place]) 06/14/2013    Past Surgical History:  Procedure Laterality Date  . CESAREAN SECTION N/A 06/22/2016   Procedure: CESAREAN SECTION;  Surgeon: Malachy Mood, MD;  Location: [facility] ORS;  Service: Obstetrics;  Laterality: N/A;  . CHOLECYSTECTOMY N/A 08/13/2016   Procedure: LAPAROSCOPIC CHOLECYSTECTOMY;  Surgeon: Jules Husbands, MD;  Location: [facility] ORS;  Service: General;  Laterality: N/A;  . DILATION AND CURETTAGE OF UTERUS    . ovarian cyst removed    . SALPINGECTOMY Right 2013   ectopic pregnancy  . TONSILLECTOMY    . TONSILLECTOMY AND ADENOIDECTOMY      Prior to Admission medications   Medication Sig Start Date End Date Taking? Authorizing Provider  albuterol (PROVENTIL HFA;VENTOLIN HFA) 108 (90 BASE) MCG/ACT inhaler Inhale 2 puffs into the lungs every 6 (six) hours as needed. For shortness of breath    Historical Provider, MD  amphetamine-dextroamphetamine (ADDERALL) 10 MG tablet Take 10 mg by mouth daily with breakfast.    Historical Provider, MD  blood glucose meter kit and supplies KIT Dispense based on patient and insurance preference. Use up to four times daily as directed. (FOR ICD-9 250.00, 250.01). 06/18/15   Ahmed Prima, MD  clonazePAM (KLONOPIN) 1 MG tablet Take 1 mg by mouth 2 (two) times daily.    Historical Provider, MD  cyclobenzaprine (FLEXERIL) 10 MG tablet Take 1 tablet (10 mg total) by mouth 3 (three) times daily as needed for muscle spasms. 08/24/16   Diego Sarita Haver, MD  ferrous sulfate (  FERROUSUL) 325 (65 FE) MG tablet Take 1 tablet (325 mg total) by mouth 2 (two) times daily. 06/25/16   Malachy Mood, MD  FLUoxetine (PROZAC) 20 MG tablet Take 20 mg by mouth every morning.     Historical Provider, MD  folic acid (FOLVITE) 1 MG tablet Take 3 mg by mouth daily.    Historical Provider, MD  gabapentin (NEURONTIN) 300 MG capsule Take 1 capsule (300 mg total) by mouth 3 (three) times daily. 07/22/16   Diego F Pabon, MD  ibuprofen (ADVIL,MOTRIN) 600 MG  tablet Take 1 tablet (600 mg total) by mouth every 6 (six) hours as needed. 09/11/16   Lauramae Kneisley L Floyd Lusignan, PA-C  Lacosamide 100 MG TABS Take 1 tablet by mouth 2 (two) times daily. PT HAS BEEN OFF THIS MED SINCE HER PREGNANCY- PT STATES SHE IS TO SEE HER PCP ON NOV 14TH, 2017 AND WILL PLACED BACK ON THIS MED THEN 11/23/13   Historical Provider, MD  lidocaine (LIDODERM) 5 % Place 1 patch onto the skin daily. Remove & Discard patch within 12 hours or as directed by MD 07/24/16   Paulette Blanch, MD  metFORMIN (GLUCOPHAGE) 500 MG tablet Take 1,500 mg by mouth daily with breakfast.    Historical Provider, MD  ranitidine (ZANTAC) 150 MG tablet Take 1 tablet (150 mg total) by mouth at bedtime. 06/18/15   Ahmed Prima, MD    Allergies Morphine; Sulfasalazine; Tomato; Toradol [ketorolac tromethamine]; Dilaudid [hydromorphone hcl]; Latex; Sulfa antibiotics; Tramadol; Hydrocodone-acetaminophen; Iohexol; Lamictal [lamotrigine]; and Mushroom extract complex  Family History  Problem Relation Age of Onset  . Heart disease Father   . Huntington's disease Mother     Social History Social History  Substance Use Topics  . Smoking status: Current Every Day Smoker    Packs/day: 0.25    Years: 3.00    Types: Cigarettes  . Smokeless tobacco: Never Used  . Alcohol use No     Review of Systems  Constitutional: No fever/chills Eyes: No visual changes.  Cardiovascular: No chest pain, Palpitations. Respiratory: No cough, shortness of breath, wheezing, hemoptysis Gastrointestinal: No abdominal pain.  No nausea, vomiting.   Musculoskeletal: Positive left calf pain. Negative for back, neck pain.  Skin: Negative for rash, redness, swelling, abnormal warmth, bruising, skin sores. Neurological: Negative for headaches, focal weakness or numbness. No tingling. 10-point ROS otherwise negative.  ____________________________________________   PHYSICAL EXAM:  VITAL SIGNS: ED Triage Vitals  Enc Vitals Group     BP  09/11/16 1806 (!) 146/79     Pulse Rate 09/11/16 1806 (!) 109     Resp 09/11/16 1806 16     Temp 09/11/16 1806 99.1 F (37.3 C)     Temp src --      SpO2 09/11/16 1806 98 %     Weight 09/11/16 1807 212 lb (96.2 kg)     Height 09/11/16 1807 5' (1.524 m)     Head Circumference --      Peak Flow --      Pain Score --      Pain Loc --      Pain Edu? --      Excl. in [place]? --      Constitutional: Alert and oriented. Well appearing and in no acute distress. Eyes: Conjunctivae are normal.  Head: Atraumatic. Cardiovascular: Normal rate, regular rhythm. Normal S1 and S2.  Good peripheral circulation with 2+ pulses noted in bilateral lower extremities. Capillary refill is brisk in all digits of left foot.  Respiratory: Normal respiratory effort without tachypnea or retractions. Lungs CTAB with breath sounds noted in all lung fields. No wheeze, rhonchi, rales. Musculoskeletal: Tenderness to palpation about the mid, posterior calf without masses or deformities. Negative Hoaman's. Mild tenderness to palpation about the Achilles tendon distally. No tenderness to palpation of the left foot. Full range of motion of the joints of the left lower extremity without pain or difficulty. No lower extremity tenderness nor edema.  No joint effusions. Neurologic:  Normal speech and language. No gross focal neurologic deficits are appreciated. Sensation light touch grossly intact but the left lower extremity. Skin:  Skin is warm, dry and intact. No rash, redness, abnormal warmth, skin sores, bruising noted. Psychiatric: Mood and affect are normal. Speech and behavior are normal. Patient exhibits appropriate insight and judgement.   ____________________________________________   LABS (all labs ordered are listed, but only abnormal results are displayed)  Labs Reviewed  BASIC METABOLIC PANEL - Abnormal; Notable for the following:       Result Value   Glucose, Bld 105 (*)    All other components within normal  limits  CBC WITH DIFFERENTIAL/PLATELET - Abnormal; Notable for the following:    WBC 14.4 (*)    RBC 5.30 (*)    Hemoglobin 11.0 (*)    MCV 67.4 (*)    MCH 20.8 (*)    MCHC 30.9 (*)    RDW 19.9 (*)    Neutro Abs 10.5 (*)    All other components within normal limits  POC URINE PREG, ED   ____________________________________________  EKG  None ____________________________________________  RADIOLOGY I, Judithe Modest Myrtis Maille, personally viewed and evaluated these images (plain radiographs) as part of my medical decision making, as well as reviewing the written report by the radiologist.  US Venous Img Lower Unilateral Left  Result Date: 09/11/2016 CLINICAL DATA:  Left mid calf pain x1 day EXAM: LEFT LOWER EXTREMITY VENOUS DOPPLER ULTRASOUND TECHNIQUE: Gray-scale sonography with graded compression, as well as color Doppler and duplex ultrasound were performed to evaluate the lower extremity deep venous systems from the level of the common femoral vein and including the common femoral, femoral, profunda femoral, popliteal and calf veins including the posterior tibial, peroneal and gastrocnemius veins when visible. The superficial great saphenous vein was also interrogated. Spectral Doppler was utilized to evaluate flow at rest and with distal augmentation maneuvers in the common femoral, femoral and popliteal veins. COMPARISON:  None. FINDINGS: Contralateral Common Femoral Vein: Respiratory phasicity is normal and symmetric with the symptomatic side. No evidence of thrombus. Normal compressibility. Common Femoral Vein: No evidence of thrombus. Normal compressibility, respiratory phasicity and response to augmentation. Saphenofemoral Junction: No evidence of thrombus. Normal compressibility and flow on color Doppler imaging. Profunda Femoral Vein: No evidence of thrombus. Normal compressibility and flow on color Doppler imaging. Femoral Vein: No evidence of thrombus. Normal compressibility, respiratory  phasicity and response to augmentation. Popliteal Vein: No evidence of thrombus. Normal compressibility, respiratory phasicity and response to augmentation. Calf Veins: No evidence of thrombus. Normal compressibility and flow on color Doppler imaging. Superficial Great Saphenous Vein: No evidence of thrombus. Normal compressibility and flow on color Doppler imaging. Venous Reflux:  None. Other Findings:  None. IMPRESSION: No deep venous thrombosis in the visualized left lower extremity. Electronically Signed   By: Julian Hy M.D.   On: 09/11/2016 19:25    ____________________________________________    PROCEDURES  Procedure(s) performed: None   Procedures   Medications - No data to display   ____________________________________________  INITIAL IMPRESSION / ASSESSMENT AND PLAN / ED COURSE  Pertinent labs & imaging results that were available during my care of the patient were reviewed by me and considered in my medical decision making (see chart for details).  Clinical Course as of Sep 11 1957  Fri Sep 11, 2016  [id number] Patient's white count was noted to be 14.4 which is stable over the last 2 months per chart review. Patient has no physical exam evidence of cellulitis at this time and we will treat for tendinitis which is most likely. Patient has been notified of lab results as well as negative ultrasound and all questions have been answered.  [JH]    Clinical Course User Index [JH] Dedra Matsuo L Hyland Mollenkopf, PA-C    Patient's diagnosis is consistent with Achilles tendinitis of right lower extremity. Patient's workup negative for DVT. Patient will be discharged home with prescriptions for ibuprofen to take as directed. Patient is to follow up with her primary care provider or Dr. Mack Guise in orthopedics if symptoms persist past this treatment course. Patient is given ED precautions to return to the ED for any worsening or new symptoms.     ____________________________________________  FINAL CLINICAL IMPRESSION(S) / ED DIAGNOSES  Final diagnoses:  Achilles tendinitis of right lower extremity      NEW MEDICATIONS STARTED DURING THIS VISIT:  New Prescriptions   IBUPROFEN (ADVIL,MOTRIN) 600 MG TABLET    Take 1 tablet (600 mg total) by mouth every 6 (six) hours as needed.         Braxton Feathers, PA-C 09/11/16 2102    Earleen Newport, MD 09/11/16 2125

## 2016-09-11 NOTE — ED Notes (Signed)
Pt returning from ultrasound

## 2016-09-11 NOTE — ED Triage Notes (Signed)
Pt came to Ed via pov c/o left leg pain starting last night. Reports starts mid calf and goes down to ankle. Denies injury. Leg is not warm to touch, no redness or swelling.

## 2016-09-16 ENCOUNTER — Encounter: Payer: Self-pay | Admitting: Surgery

## 2016-09-16 ENCOUNTER — Ambulatory Visit (INDEPENDENT_AMBULATORY_CARE_PROVIDER_SITE_OTHER): Payer: Medicaid Other | Admitting: Surgery

## 2016-09-16 VITALS — BP 134/78 | HR 89 | Temp 98.6°F | Ht 60.0 in | Wt 223.4 lb

## 2016-09-16 DIAGNOSIS — Z09 Encounter for follow-up examination after completed treatment for conditions other than malignant neoplasm: Secondary | ICD-10-CM

## 2016-09-16 NOTE — Progress Notes (Signed)
S/p lap chole 1 month ago. Doing well . Only issues is back pain She has been referred to pain Rx Taking PO, no fevers, no biliary obstruction Continues to smoke    PE NAD Abd: soft, NT, incisions healed, no infection  A/P doing well No surgical issues RTC prn

## 2016-09-16 NOTE — Patient Instructions (Signed)

## 2016-11-18 ENCOUNTER — Emergency Department: Payer: Medicaid Other

## 2016-11-18 ENCOUNTER — Encounter: Payer: Self-pay | Admitting: *Deleted

## 2016-11-18 ENCOUNTER — Emergency Department
Admission: EM | Admit: 2016-11-18 | Discharge: 2016-11-18 | Disposition: A | Discharge status: Home / Self Care | Payer: Medicaid Other | Attending: Emergency Medicine | Admitting: Emergency Medicine

## 2016-11-18 DIAGNOSIS — R109 Unspecified abdominal pain: Secondary | ICD-10-CM

## 2016-11-18 DIAGNOSIS — R1031 Right lower quadrant pain: Secondary | ICD-10-CM | POA: Insufficient documentation

## 2016-11-18 DIAGNOSIS — Z7984 Long term (current) use of oral hypoglycemic drugs: Secondary | ICD-10-CM | POA: Insufficient documentation

## 2016-11-18 DIAGNOSIS — J45909 Unspecified asthma, uncomplicated: Secondary | ICD-10-CM | POA: Diagnosis not present

## 2016-11-18 DIAGNOSIS — Z79899 Other long term (current) drug therapy: Secondary | ICD-10-CM | POA: Diagnosis not present

## 2016-11-18 DIAGNOSIS — E119 Type 2 diabetes mellitus without complications: Secondary | ICD-10-CM | POA: Diagnosis not present

## 2016-11-18 DIAGNOSIS — F17210 Nicotine dependence, cigarettes, uncomplicated: Secondary | ICD-10-CM | POA: Insufficient documentation

## 2016-11-18 DIAGNOSIS — R0789 Other chest pain: Secondary | ICD-10-CM | POA: Diagnosis not present

## 2016-11-18 LAB — BASIC METABOLIC PANEL
ANION GAP: 7 (ref 5–15)
BUN: 10 mg/dL (ref 6–20)
CHLORIDE: 103 mmol/L (ref 101–111)
CO2: 25 mmol/L (ref 22–32)
CREATININE: 0.65 mg/dL (ref 0.44–1.00)
Calcium: 8.7 mg/dL — ABNORMAL LOW (ref 8.9–10.3)
GFR calc non Af Amer: >60 mL/min (ref >60)
Glucose, Bld: 98 mg/dL (ref 65–99)
POTASSIUM: 3.6 mmol/L (ref 3.5–5.1)
SODIUM: 135 mmol/L (ref 135–145)

## 2016-11-18 LAB — CBC
HCT: 38.6 % (ref 35.0–47.0)
HEMOGLOBIN: 12.3 g/dL (ref 12.0–16.0)
MCH: 20.6 pg — ABNORMAL LOW (ref 26.0–34.0)
MCHC: 31.8 g/dL — ABNORMAL LOW (ref 32.0–36.0)
MCV: 65 fL — AB (ref 80.0–100.0)
PLATELETS: 466 10*3/uL — AB (ref 150–440)
RBC: 5.94 MIL/uL — AB (ref 3.80–5.20)
RDW: 19.3 % — ABNORMAL HIGH (ref 11.5–14.5)
WBC: 13.2 10*3/uL — AB (ref 3.6–11.0)

## 2016-11-18 LAB — URINALYSIS, COMPLETE (UACMP) WITH MICROSCOPIC
BACTERIA UA: NONE SEEN
BILIRUBIN URINE: NEGATIVE
Glucose, UA: NEGATIVE mg/dL
HGB URINE DIPSTICK: NEGATIVE
Ketones, ur: NEGATIVE mg/dL
NITRITE: NEGATIVE
PH: 5 (ref 5.0–8.0)
Protein, ur: NEGATIVE mg/dL
SPECIFIC GRAVITY, URINE: 1.026 (ref 1.005–1.030)

## 2016-11-18 LAB — LIPASE, BLOOD: Lipase: 24 U/L (ref 11–51)

## 2016-11-18 LAB — HEPATIC FUNCTION PANEL
ALK PHOS: 83 U/L (ref 38–126)
ALT: 18 U/L (ref 14–54)
AST: 21 U/L (ref 15–41)
Albumin: 4.1 g/dL (ref 3.5–5.0)
Total Bilirubin: 0.1 mg/dL — ABNORMAL LOW (ref 0.3–1.2)
Total Protein: 7.9 g/dL (ref 6.5–8.1)

## 2016-11-18 LAB — TROPONIN I
Troponin I: <0.03 ng/mL (ref <0.03)
Troponin I: <0.03 ng/mL (ref <0.03)

## 2016-11-18 LAB — POCT PREGNANCY, URINE: PREG TEST UR: NEGATIVE

## 2016-11-18 MED ORDER — LIDOCAINE 5 % EX PTCH: 1.0000 | MEDICATED_PATCH | CUTANEOUS | 0 refills | Status: DC

## 2016-11-18 MED ORDER — LIDOCAINE 5 % EX PTCH
1.0000 | MEDICATED_PATCH | CUTANEOUS | Status: DC
  Administered 2016-11-18: 1 via TRANSDERMAL
  Filled 2016-11-18: qty 1

## 2016-11-18 NOTE — Discharge Instructions (Signed)
He is continue with your current medications at home. Take over-the-counter pain medications such as Tylenol if tolerated. Return especially for fever, persistent vomiting, bloody urine, bloody stool, or any other new concerns  Please return immediately if condition worsens. Please contact her primary physician or the physician you were given for referral. If you have any specialist physicians involved in her treatment and plan please also contact them. Thank you for using [facility].

## 2016-11-18 NOTE — ED Triage Notes (Signed)
States mid chest pain for 1 week, states nausea and vomiting, states SOB, awake and alert in no acute distress

## 2016-11-18 NOTE — ED Notes (Signed)
Pt denies any c/o CP; states she is here for abdominal pain related to Gall Bladder surgery back in November that radiates into her flank. Pt states "they left a gall stone" even though she claims her gall bladder was removed. Dr Huel CoteQuigley at bedside at this time.

## 2016-11-18 NOTE — ED Provider Notes (Signed)
Time Seen: Approximately 1924  I have reviewed the triage notes  Chief Complaint: Abdominal Pain and Flank Pain   History of Present Illness: Faith [name] is a 26 y.o. female who presents with sided chest discomfort and some right flank pain that she states gone on for the past week. Patient denies any recurrent vomiting and states only occasional nausea. She states it hurts her to take a deep breath. She denies any pulmonary emboli risk factors. She denies any history of blood clots in the legs of the lungs. Her main concern to this historian is right flank pain score points mainly to the right lateral surface and states the chest pains been going on before and she was seen by her primary physician and stated was mostly musculoskeletal in nature. It is new. She denies any dysuria, hematuria or urinary frequency.   Past Medical History:  Diagnosis Date  . Anemia    BLOOD TRANSFUSION AFTER C-SECTION ON 06-2016-2 UNITS  . Asthma    WELL CONTROLLED  . Depression   . Diabetes mellitus without complication (HCC)   . Gallstones   . GERD (gastroesophageal reflux disease)   . Heart murmur    AS A CHILD  . Migraines   . Ovarian cyst   . Seizures (HCC)    LAST SEIZURE 2016  . Tubal pregnancy     Patient Active Problem List   Diagnosis Date Noted  . Cholecystitis, acute   . Indication for care in labor or delivery 06/20/2016  . Suprapubic abdominal pain 04/07/2016  . Abdominal pain in pregnancy 03/15/2016  . Diabetes mellitus during pregnancy, antepartum 01/16/2016  . ADD (attention deficit disorder) 07/17/2015  . Affective bipolar disorder (HCC) 07/17/2015  . Body mass index of 60 or higher 01/20/2015  . Post traumatic stress disorder (PTSD) 06/16/2013  . Depression 06/15/2013  . Suicidal ideation 06/15/2013  . Status epilepticus (HCC) 06/14/2013  . Diabetes mellitus type 2 in obese (HCC) 06/14/2013    Past Surgical History:  Procedure Laterality Date  . CESAREAN SECTION  N/A 06/22/2016   Procedure: CESAREAN SECTION;  Surgeon: Vena Austria, MD;  Location: [facility] OR[facility];  Service: Obstetrics;  Laterality: N/A;  . CHOLECYSTECTOMY N/A 08/13/2016   Procedure: LAPAROSCOPIC CHOLECYSTECTOMY;  Surgeon: Leafy Ro, MD;  Location: [facility] OR[facility];  Service: General;  Laterality: N/A;  . DILATION AND CURETTAGE OF UTERUS    . ovarian cyst removed    . SALPINGECTOMY Right 2013   ectopic pregnancy  . TONSILLECTOMY    . TONSILLECTOMY AND ADENOIDECTOMY      Past Surgical History:  Procedure Laterality Date  . CESAREAN SECTION N/A 06/22/2016   Procedure: CESAREAN SECTION;  Surgeon: Vena Austria, MD;  Location: [facility] OR[facility];  Service: Obstetrics;  Laterality: N/A;  . CHOLECYSTECTOMY N/A 08/13/2016   Procedure: LAPAROSCOPIC CHOLECYSTECTOMY;  Surgeon: Leafy Ro, MD;  Location: [facility];  Service: General;  Laterality: N/A;  . DILATION AND CURETTAGE OF UTERUS    . ovarian cyst removed    . SALPINGECTOMY Right 2013   ectopic pregnancy  . TONSILLECTOMY    . TONSILLECTOMY AND ADENOIDECTOMY      Current Outpatient Rx  . Order #: [id number] Class: Historical Med  . Order #: [id number] Class: Historical Med  . Order #: [id number] Class: Print  . Order #: [id number] Class: Historical Med  . Order #: [id number] Class: Normal  . Order #: [id number] Class: Print  . Order #: [id number] Class: Historical Med  . Order #: [id number] Class: Historical Med  . Order #:  [id number] Class: Normal  . Order #: [id number] Class: Print  . Order #: [id number] Class: Historical Med  . Order #: [id number] Class: Print  . Order #: [id number] Class: Historical Med  . Order #: [id number] Class: Print    Allergies:  Morphine; Sulfasalazine; Tomato; Toradol [ketorolac tromethamine]; Dilaudid [hydromorphone hcl]; Latex; Sulfa antibiotics; Tramadol; Hydrocodone-acetaminophen; Iohexol; Lamictal [lamotrigine]; and Mushroom extract complex  Family History: Family History  Problem Relation Age of Onset  . Heart disease Father    . Huntington's disease Mother     Social History: Social History  Substance Use Topics  . Smoking status: Current Every Day Smoker    Packs/day: 0.25    Years: 3.00    Types: Cigarettes  . Smokeless tobacco: Never Used  . Alcohol use No     Review of Systems:   10 point review of systems was performed and was otherwise negative:  Constitutional: No fever Eyes: No visual disturbances ENT: No sore throat, ear pain Cardiac: Occasional intermittent left-sided chest discomfort. No radiation of discomfort. Respiratory: No shortness of breath, wheezing, or stridor Abdomen: No abdominal pain, no vomiting, No diarrhea Endocrine: No weight loss, No night sweats Extremities: No peripheral edema, cyanosis Skin: No rashes, easy bruising Neurologic: No focal weakness, trouble with speech or swollowing Urologic: No dysuria, Hematuria, or urinary frequency Patient points mainly to the right flank area with no history of trauma.  Physical Exam:  ED Triage Vitals  Enc Vitals Group     BP 11/18/16 1708 126/68     Pulse Rate 11/18/16 1708 (!) 108     Resp 11/18/16 1708 18     Temp 11/18/16 1708 98.8 F (37.1 C)     Temp Source 11/18/16 1708 Oral     SpO2 11/18/16 1708 98 %     Weight 11/18/16 1708 212 lb (96.2 kg)     Height 11/18/16 1708 5' (1.524 m)     Head Circumference --      Peak Flow --      Pain Score 11/18/16 1709 10     Pain Loc --      Pain Edu? --      Excl. in GC? --     General: Awake , Alert , and Oriented times 3; GCS 15 Head: Normal cephalic , atraumatic Eyes: Pupils equal , round, reactive to light Nose/Throat: No nasal drainage, patent upper airway without erythema or exudate.  Neck: Supple, Full range of motion, No anterior adenopathy or palpable thyroid masses Lungs: Clear to ascultation without wheezes , rhonchi, or rales Heart: Regular rate, regular rhythm without murmurs , gallops , or rubs Abdomen: Soft, non tender without rebound, guarding , or  rigidity; bowel sounds positive and symmetric in all 4 quadrants. No organomegaly .        Extremities: 2 plus symmetric pulses. No edema, clubbing or cyanosis Neurologic: normal ambulation, Motor symmetric without deficits, sensory intact Skin: warm, dry, no rashes Tender diffusely across the upper left chest wall region along with tenderness over the right flank region. There is no rashes or lesions.  Labs:   All laboratory work was reviewed including any pertinent negatives or positives listed below:  Labs Reviewed  BASIC METABOLIC PANEL - Abnormal; Notable for the following:       Result Value   Calcium 8.7 (*)    All other components within normal limits  CBC - Abnormal; Notable for the following:    WBC 13.2 (*)    RBC 5.94 (*)    MCV 65.0 (*)  MCH 20.6 (*)    MCHC 31.8 (*)    RDW 19.3 (*)    Platelets 466 (*)    All other components within normal limits  URINALYSIS, COMPLETE (UACMP) WITH MICROSCOPIC - Abnormal; Notable for the following:    Color, Urine YELLOW (*)    APPearance CLEAR (*)    Leukocytes, UA TRACE (*)    Squamous Epithelial / LPF 6-30 (*)    All other components within normal limits  HEPATIC FUNCTION PANEL - Abnormal; Notable for the following:    Total Bilirubin 0.1 (*)    Bilirubin, Direct <0.1 (*)    All other components within normal limits  TROPONIN I  LIPASE, BLOOD  TROPONIN I  POC URINE PREG, ED  POCT PREGNANCY, URINE  Patient's white blood cell count is elevated though review of her previous labs here show a consistently elevated white blood cell count First troponin was 0.04, second troponin less than 0.03   EKG: * ED ECG REPORT I, Jennye MoccasinBrian S Keefer Soulliere, the attending physician, personally viewed and interpreted this ECG.  Date: 11/18/2016 EKG Time:1714Rate:102 Rhythm: Sinus tachycardia QRS Axis: normal Intervals: normal ST/T Wave abnormalities: normal Conduction Disturbances: none Narrative Interpretation: unremarkable Otherwise normal  EKG   Radiology: * "Dg Chest 2 View  Result Date: 11/18/2016 CLINICAL DATA:  Mid chest pain for 1 week. Nausea and vomiting. Shortness of breath. EXAM: CHEST  2 VIEW COMPARISON:  08/17/2014 FINDINGS: The cardiomediastinal silhouette is within normal limits. The lungs are well inflated and clear. There is no evidence of pleural effusion or pneumothorax. No acute osseous abnormality is identified. IMPRESSION: No active cardiopulmonary disease. Electronically Signed   By: Sebastian AcheAllen  Grady M.D.   On: 11/18/2016 17:55   Ct Renal Stone Study  Result Date: 11/18/2016 CLINICAL DATA:  Acute onset of mid chest pain. Nausea and vomiting. Shortness of breath. Initial encounter. EXAM: CT ABDOMEN AND PELVIS WITHOUT CONTRAST TECHNIQUE: Multidetector CT imaging of the abdomen and pelvis was performed following the standard protocol without IV contrast. COMPARISON:  CT of the abdomen and pelvis from 05/17/2014, and right upper quadrant ultrasound performed 08/21/2016 FINDINGS: Lower chest: The visualized lung bases are grossly clear. The visualized portions of the mediastinum are unremarkable. Hepatobiliary: The liver is unremarkable in appearance. The patient is status post cholecystectomy, with clips noted at the gallbladder fossa. The common bile duct remains normal in caliber. Pancreas: The pancreas is within normal limits. Spleen: The spleen is unremarkable in appearance. Adrenals/Urinary Tract: The adrenal glands are unremarkable in appearance. The kidneys are within normal limits. There is no evidence of hydronephrosis. No renal or ureteral stones are identified. No perinephric stranding is seen. Stomach/Bowel: The stomach is unremarkable in appearance. The small bowel is within normal limits. The appendix is normal in caliber, without evidence of appendicitis. The colon is unremarkable in appearance. Vascular/Lymphatic: The abdominal aorta is unremarkable in appearance. The inferior vena cava is grossly unremarkable.  No retroperitoneal lymphadenopathy is seen. No pelvic sidewall lymphadenopathy is identified. Reproductive: The bladder is decompressed and not well assessed. The uterus is unremarkable in appearance. The right ovary is mildly larger than the left ovary, but likely still within normal limits. No suspicious adnexal masses are seen. Other: No additional soft tissue abnormalities are seen. Musculoskeletal: No acute osseous abnormalities are identified. Vacuum phenomenon at L5-S1 likely remains within normal limits. The visualized musculature is unremarkable in appearance. IMPRESSION: No acute abnormality seen within the abdomen or pelvis. Electronically Signed   By: Beryle BeamsJeffery  Chang M.D.  On: 11/18/2016 20:36  "  I personally reviewed the radiologic studies     ED Course: * He should stay here was uneventful and the patient remained hemodynamically stable. She does not appear to have any findings consistent with an acute abdomen. In her current clinical presentation and objective findings and past history felt this was most likely muscular skeletal in nature may be a continuation what sounds like a chronic musculoskeletal pain across her shoulders etc.      Assessment: Musculoskeletal flank and chest pain   Final Clinical Impression: *  Final diagnoses:  Left flank discomfort  Right flank pain     Plan:  Outpatient " New Prescriptions   LIDOCAINE (LIDODERM) 5 %    Place 1 patch onto the skin daily.  " Patient was advised to return immediately if condition worsens. Patient was advised to follow up with their primary care physician or other specialized physicians involved in their outpatient care. The patient and/or family member/power of attorney had laboratory results reviewed at the bedside. All questions and concerns were addressed and appropriate discharge instructions were distributed by the nursing staff.             Jennye Moccasin, MD 11/18/16 2230

## 2016-11-23 ENCOUNTER — Other Ambulatory Visit: Payer: Self-pay | Admitting: Surgery

## 2016-11-23 ENCOUNTER — Telehealth: Payer: Self-pay

## 2016-11-23 ENCOUNTER — Other Ambulatory Visit
Admission: RE | Admit: 2016-11-23 | Discharge: 2016-11-23 | Disposition: A | Discharge status: Home / Self Care | Payer: Medicaid Other | Source: Ambulatory Visit | Attending: Surgery | Admitting: Surgery

## 2016-11-23 DIAGNOSIS — G8929 Other chronic pain: Secondary | ICD-10-CM

## 2016-11-23 DIAGNOSIS — R1011 Right upper quadrant pain: Secondary | ICD-10-CM | POA: Insufficient documentation

## 2016-11-23 DIAGNOSIS — R7989 Other specified abnormal findings of blood chemistry: Secondary | ICD-10-CM

## 2016-11-23 DIAGNOSIS — R945 Abnormal results of liver function studies: Principal | ICD-10-CM

## 2016-11-23 LAB — COMPREHENSIVE METABOLIC PANEL
ALT: 453 U/L — ABNORMAL HIGH (ref 14–54)
AST: 206 U/L — AB (ref 15–41)
Albumin: 3.9 g/dL (ref 3.5–5.0)
Alkaline Phosphatase: 176 U/L — ABNORMAL HIGH (ref 38–126)
Anion gap: 9 (ref 5–15)
BUN: 7 mg/dL (ref 6–20)
CHLORIDE: 103 mmol/L (ref 101–111)
CO2: 25 mmol/L (ref 22–32)
Calcium: 8.8 mg/dL — ABNORMAL LOW (ref 8.9–10.3)
Creatinine, Ser: 0.77 mg/dL (ref 0.44–1.00)
GFR calc Af Amer: >60 mL/min (ref >60)
Glucose, Bld: 101 mg/dL — ABNORMAL HIGH (ref 65–99)
POTASSIUM: 3.6 mmol/L (ref 3.5–5.1)
Sodium: 137 mmol/L (ref 135–145)
Total Bilirubin: 0.8 mg/dL (ref 0.3–1.2)
Total Protein: 7.6 g/dL (ref 6.5–8.1)

## 2016-11-23 LAB — CBC WITH DIFFERENTIAL/PLATELET
BASOS ABS: 0 10*3/uL (ref 0–0.1)
Basophils Relative: 0 %
EOS PCT: 4 %
Eosinophils Absolute: 0.5 10*3/uL (ref 0–0.7)
HCT: 37.2 % (ref 35.0–47.0)
Hemoglobin: 11.7 g/dL — ABNORMAL LOW (ref 12.0–16.0)
Lymphocytes Relative: 20 %
Lymphs Abs: 2.3 10*3/uL (ref 1.0–3.6)
MCH: 20.7 pg — ABNORMAL LOW (ref 26.0–34.0)
MCHC: 31.5 g/dL — ABNORMAL LOW (ref 32.0–36.0)
MCV: 65.7 fL — AB (ref 80.0–100.0)
MONO ABS: 0.6 10*3/uL (ref 0.2–0.9)
Monocytes Relative: 5 %
Neutro Abs: 7.9 10*3/uL — ABNORMAL HIGH (ref 1.4–6.5)
Neutrophils Relative %: 71 %
PLATELETS: 440 10*3/uL (ref 150–440)
RBC: 5.67 MIL/uL — ABNORMAL HIGH (ref 3.80–5.20)
RDW: 19.6 % — ABNORMAL HIGH (ref 11.5–14.5)
WBC: 11.3 10*3/uL — ABNORMAL HIGH (ref 3.6–11.0)

## 2016-11-23 NOTE — Telephone Encounter (Addendum)
Liver Function Test was elevated. Spoke with Dr. Excell Seltzerooper. He has ordered MRCP for tomorrow and he verbalized possible need to see GI for stone extraction if stone present.  MRCP scheduled for 0930 in [place]. Patient will need to have Pregnancy Test prior to testing. [facility] [place] at 0830am tomorrow morning and have nothing to eat after midnight tonight. Orders placed.  Spoke with Dr. Annabell SabalWohl's nurse Ginger, we will place patient on schedule for possible ERCP tomorrow.  This has been discussed with patient. She wishes to proceed with plan. She has been given all information above. LMP was 10/13/16 per patient. Pregnancy test ordered as well.

## 2016-11-23 NOTE — Telephone Encounter (Signed)
Spoke with Dr. Excell Seltzerooper regarding this patient. He has ordered CBC, CMP. Depending on results, MRCP may be warranted.   Orders placed.  Called patient and explained that she would need labs done today and I would call with results and plan of care going forward. She verbalizes understanding and agrees with plan.

## 2016-11-23 NOTE — Telephone Encounter (Signed)
Patient is calling because she feels like she is having a gallbladder attack. She had a Laparoscopic Cholecystectomy by Dr. Everlene FarrierPabon on 08/13/2017. [facility], they ran some tests on her and said everything looked fine. There were no evidence of gallstones to their knowledge. They gave her some muscle relaxer's and patient states that it works. She doesn't have these attacks when she eats. Patient is calling to see if this is normal. Please call patient and advice.

## 2016-11-24 ENCOUNTER — Telehealth: Payer: Self-pay

## 2016-11-24 ENCOUNTER — Other Ambulatory Visit: Payer: Self-pay

## 2016-11-24 ENCOUNTER — Ambulatory Visit
Admission: RE | Admit: 2016-11-24 | Discharge: 2016-11-24 | Disposition: A | Discharge status: Home / Self Care | Payer: Medicaid Other | Source: Ambulatory Visit | Attending: Surgery | Admitting: Surgery

## 2016-11-24 DIAGNOSIS — G8929 Other chronic pain: Secondary | ICD-10-CM

## 2016-11-24 DIAGNOSIS — R7989 Other specified abnormal findings of blood chemistry: Secondary | ICD-10-CM

## 2016-11-24 DIAGNOSIS — R1011 Right upper quadrant pain: Principal | ICD-10-CM

## 2016-11-24 DIAGNOSIS — K5710 Diverticulosis of small intestine without perforation or abscess without bleeding: Secondary | ICD-10-CM | POA: Diagnosis not present

## 2016-11-24 DIAGNOSIS — K760 Fatty (change of) liver, not elsewhere classified: Secondary | ICD-10-CM | POA: Diagnosis not present

## 2016-11-24 DIAGNOSIS — Z9049 Acquired absence of other specified parts of digestive tract: Secondary | ICD-10-CM | POA: Diagnosis not present

## 2016-11-24 DIAGNOSIS — R945 Abnormal results of liver function studies: Secondary | ICD-10-CM

## 2016-11-24 MED ORDER — GADOBENATE DIMEGLUMINE 529 MG/ML IV SOLN
20.0000 mL | Freq: Once | INTRAVENOUS | Status: AC | PRN
  Administered 2016-11-24: 20 mL via INTRAVENOUS

## 2016-11-24 NOTE — Telephone Encounter (Signed)
Spoke with Dr. Everlene FarrierPabon at this time. She is to see him tomorrow at 3pm. He would like to obtain a CBC, CMP, Lipase, Fractionated Direct and Indirect Bilirubin, and a CT Abdomen and Pelvis with Contrast. Then she will be seen in the office right afterwards. She is to be kept NPO until she is seen in the office.  Orders placed.  [facility] Patient to arrive 1115 and will need to be NPO 4 hours prior to arrival.   Patient to [facility] at 10[facility] for labs, then will have CT completed at outpatient imaging center, and then come to office to see surgeon. She has been informed of all information above with locations, times, and addresses. She verbalizes understanding and has read back all information.

## 2016-11-24 NOTE — Telephone Encounter (Signed)
Reviewed results of MRCP with Dr. Excell Seltzerooper. He has discussed this with Dr. Servando SnareWohl and has decided that ERCP is not warranted at this time. Patient is to follow-up with Dr. Everlene FarrierPabon tomorrow per Dr. Excell Seltzerooper.   Patient has been notified of this information and results were reviewed with her over the phone.  Patient placed on schedule for Dr. Everlene FarrierPabon tomorrow.

## 2016-11-24 NOTE — Telephone Encounter (Signed)
CT Abdomin/Pelvis CPT code [id number] for further review. Spoke with Ancil LinseyMarina S. With [employer].

## 2016-11-25 ENCOUNTER — Ambulatory Visit: Admission: RE | Admit: 2016-11-25 | Payer: Medicaid Other | Source: Ambulatory Visit | Admitting: Surgery

## 2016-11-25 ENCOUNTER — Other Ambulatory Visit
Admission: RE | Admit: 2016-11-25 | Discharge: 2016-11-25 | Disposition: A | Discharge status: Home / Self Care | Payer: Medicaid Other | Source: Ambulatory Visit | Attending: Surgery | Admitting: Surgery

## 2016-11-25 ENCOUNTER — Ambulatory Visit
Admission: RE | Admit: 2016-11-25 | Discharge: 2016-11-25 | Disposition: A | Discharge status: Home / Self Care | Payer: Medicaid Other | Source: Ambulatory Visit | Attending: Surgery | Admitting: Surgery

## 2016-11-25 ENCOUNTER — Encounter: Payer: Self-pay | Admitting: Surgery

## 2016-11-25 ENCOUNTER — Ambulatory Visit (INDEPENDENT_AMBULATORY_CARE_PROVIDER_SITE_OTHER): Payer: Medicaid Other | Admitting: Surgery

## 2016-11-25 ENCOUNTER — Ambulatory Visit: Admission: RE | Admit: 2016-11-25 | Payer: Medicaid Other | Source: Ambulatory Visit

## 2016-11-25 ENCOUNTER — Other Ambulatory Visit: Payer: Self-pay

## 2016-11-25 VITALS — BP 121/82 | HR 92 | Temp 98.4°F | Ht 60.0 in | Wt 220.6 lb

## 2016-11-25 DIAGNOSIS — Z9049 Acquired absence of other specified parts of digestive tract: Secondary | ICD-10-CM | POA: Insufficient documentation

## 2016-11-25 DIAGNOSIS — G8929 Other chronic pain: Secondary | ICD-10-CM

## 2016-11-25 DIAGNOSIS — R1011 Right upper quadrant pain: Secondary | ICD-10-CM

## 2016-11-25 DIAGNOSIS — B179 Acute viral hepatitis, unspecified: Secondary | ICD-10-CM | POA: Diagnosis not present

## 2016-11-25 LAB — CBC WITH DIFFERENTIAL/PLATELET
BASOS ABS: 0.1 10*3/uL (ref 0–0.1)
Basophils Relative: 1 %
EOS PCT: 6 %
Eosinophils Absolute: 0.7 10*3/uL (ref 0–0.7)
HCT: 38.1 % (ref 35.0–47.0)
Hemoglobin: 12.3 g/dL (ref 12.0–16.0)
LYMPHS ABS: 2.5 10*3/uL (ref 1.0–3.6)
LYMPHS PCT: 21 %
MCH: 21 pg — AB (ref 26.0–34.0)
MCHC: 32.3 g/dL (ref 32.0–36.0)
MCV: 64.9 fL — AB (ref 80.0–100.0)
Monocytes Absolute: 0.6 10*3/uL (ref 0.2–0.9)
Monocytes Relative: 5 %
NEUTROS ABS: 8.5 10*3/uL — AB (ref 1.4–6.5)
Neutrophils Relative %: 67 %
Platelets: 394 10*3/uL (ref 150–440)
RBC: 5.87 MIL/uL — AB (ref 3.80–5.20)
RDW: 19.4 % — ABNORMAL HIGH (ref 11.5–14.5)
WBC: 12.4 10*3/uL — AB (ref 3.6–11.0)

## 2016-11-25 LAB — COMPREHENSIVE METABOLIC PANEL
ALK PHOS: 144 U/L — AB (ref 38–126)
ALT: 195 U/L — AB (ref 14–54)
AST: 32 U/L (ref 15–41)
Albumin: 4.2 g/dL (ref 3.5–5.0)
Anion gap: 8 (ref 5–15)
BUN: 6 mg/dL (ref 6–20)
CALCIUM: 9.2 mg/dL (ref 8.9–10.3)
CO2: 24 mmol/L (ref 22–32)
CREATININE: 0.64 mg/dL (ref 0.44–1.00)
Chloride: 105 mmol/L (ref 101–111)
Glucose, Bld: 89 mg/dL (ref 65–99)
Potassium: 4.1 mmol/L (ref 3.5–5.1)
Sodium: 137 mmol/L (ref 135–145)
TOTAL PROTEIN: 7.9 g/dL (ref 6.5–8.1)
Total Bilirubin: 0.5 mg/dL (ref 0.3–1.2)

## 2016-11-25 LAB — LIPASE, BLOOD: LIPASE: 22 U/L (ref 11–51)

## 2016-11-25 NOTE — Telephone Encounter (Signed)
Spoke with patient and Dr. Everlene FarrierPabon in regards to this. We will keep patient on schedule for CT as scheduled but have changed order to PO contrast only. Judeth CornfieldStephanie, CT Tech has been made aware.   Allergy per patient has been added to chart.  Patient has been made aware and is heading over to Outpatient imaging center at this time to begin drinking her contrast.

## 2016-11-25 NOTE — Telephone Encounter (Signed)
Patient was scheduled for a CT of the abdomen and Pelvis today but this was cancelled because she is allergic to the dye. The radiology nurse told her that we would need to contact them with instructions before she can come back in and that she needs to be given a prescription. Please call the patient and advice.

## 2016-11-25 NOTE — Progress Notes (Signed)
Outpatient Surgical Follow Up  11/25/2016  Faith [name] is an 26 y.o. female.   Chief Complaint  Patient presents with  . Follow-up    Abdominal Pain    HPI: Faith [name] is seen for abdominal pain.she is s/p lap chole more than 3 months ago and over a week now is complaining of epigastric pain and right upper quadrant pain. She reports the pain is intermittent moderate in intensity and worsening with movement. She is taking by mouth. No fevers no chills no evidence of biliary obstruction. She has gone to the emergency room here at [facility] and at [facility]. Her workup has included 2 CT scans of the abdomen and pelvis that I have personally reviewed there is no evidence of biliary dilation. There is no evidence of abscesses or free air there is no evidence of fluid collections around the liver. Ultrasound was performed as well and he was unremarkable. Common bile duct was normal. She did have elevation of the AST and ALT as well as alkaline phosphatase and therefore we obtain an MRCP. I have personally reviewed there is no evidence of biliary pathology or retained gallstones. Repeat LFTs now shown improvement with persistent increasing in the AST and ALT. Of note the patient does report taking Tylenol 2 tablets every 4 hours for several days.  Past Medical History:  Diagnosis Date  . Anemia    BLOOD TRANSFUSION AFTER C-SECTION ON 06-2016-2 UNITS  . Asthma    WELL CONTROLLED  . Depression   . Diabetes mellitus without complication ([place])   . Gallstones   . GERD (gastroesophageal reflux disease)   . Heart murmur    AS A CHILD  . Migraines   . Ovarian cyst   . Seizures ([place])    LAST SEIZURE 2016  . Tubal pregnancy     Past Surgical History:  Procedure Laterality Date  . CESAREAN SECTION N/A 06/22/2016   Procedure: CESAREAN SECTION;  Surgeon: Malachy Mood, MD;  Location: [facility] ORS;  Service: Obstetrics;  Laterality: N/A;  . CHOLECYSTECTOMY N/A 08/13/2016   Procedure: LAPAROSCOPIC  CHOLECYSTECTOMY;  Surgeon: Jules Husbands, MD;  Location: [facility] ORS;  Service: General;  Laterality: N/A;  . DILATION AND CURETTAGE OF UTERUS    . ovarian cyst removed    . SALPINGECTOMY Right 2013   ectopic pregnancy  . TONSILLECTOMY    . TONSILLECTOMY AND ADENOIDECTOMY      Family History  Problem Relation Age of Onset  . Heart disease Father   . Huntington's disease Mother     Social History:  reports that she has been smoking Cigarettes.  She has a 0.75 pack-year smoking history. She has never used smokeless tobacco. She reports that she does not drink alcohol or use drugs.  Allergies:  Allergies  Allergen Reactions  . Ivp Dye [Iodinated Diagnostic Agents] Anaphylaxis  . Morphine Other (See Comments)    bradycradia bradycradia  . Sulfasalazine Hives  . Tomato Anaphylaxis  . Toradol [Ketorolac Tromethamine] Anaphylaxis, Hives and Swelling  . Dilaudid [Hydromorphone Hcl] Hives  . Latex Rash  . Sulfa Antibiotics Hives  . Tramadol Swelling, Rash and Hives  . Hydrocodone-Acetaminophen Other (See Comments)  . Iohexol Other (See Comments)    Pt. States she can not take iohexol but won't state reaction type. Pt. States she can not take iohexol but won't state reaction type. Pt. States she can not take iohexol but won't state reaction type.  . Lamictal [Lamotrigine] Other (See Comments)  . Mushroom Extract Complex  Rash    Medications reviewed.    ROS  Full review of system was performed and is otherwise negative other than what is positive on HPI  BP 121/82   Pulse 92   Temp 98.4 F (36.9 C) (Oral)   Ht 5' (1.524 m)   Wt 100.1 kg (220 lb 9.6 oz)   LMP 10/13/2016 (Exact Date)   BMI 43.08 kg/m   Physical Exam  Constitutional: She is oriented to person, place, and time and well-developed, well-nourished, and in no distress.  Neck: Normal range of motion. No JVD present.  Pulmonary/Chest: Effort normal. No respiratory distress.  Abdominal: Soft. She exhibits no  distension and no mass. There is no tenderness. There is no rebound and no guarding.  Lymphadenopathy:    She has no cervical adenopathy.  Neurological: She is alert and oriented to person, place, and time. Gait normal. Coordination normal. GCS score is 15.  Skin: Skin is warm and dry.  Psychiatric: Mood, memory, affect and judgment normal.  Nursing note and vitals reviewed.      Results for orders placed or performed during the hospital encounter of 11/25/16 (from the past 48 hour(s))  CBC with Differential     Status: Abnormal   Collection Time: 11/25/16 10:39 AM  Result Value Ref Range   WBC 12.4 (H) 3.6 - 11.0 K/uL   RBC 5.87 (H) 3.80 - 5.20 MIL/uL   Hemoglobin 12.3 12.0 - 16.0 g/dL   HCT 38.1 35.0 - 47.0 %   MCV 64.9 (L) 80.0 - 100.0 fL   MCH 21.0 (L) 26.0 - 34.0 pg   MCHC 32.3 32.0 - 36.0 g/dL   RDW 19.4 (H) 11.5 - 14.5 %   Platelets 394 150 - 440 K/uL    Comment: PLATELET CLUMPS NOTED ON SMEAR, COUNT APPEARS ADEQUATE   Neutrophils Relative % 67 %   Neutro Abs 8.5 (H) 1.4 - 6.5 K/uL   Lymphocytes Relative 21 %   Lymphs Abs 2.5 1.0 - 3.6 K/uL   Monocytes Relative 5 %   Monocytes Absolute 0.6 0.2 - 0.9 K/uL   Eosinophils Relative 6 %   Eosinophils Absolute 0.7 0 - 0.7 K/uL   Basophils Relative 1 %   Basophils Absolute 0.1 0 - 0.1 K/uL  Comprehensive metabolic panel     Status: Abnormal   Collection Time: 11/25/16 10:39 AM  Result Value Ref Range   Sodium 137 135 - 145 mmol/L   Potassium 4.1 3.5 - 5.1 mmol/L   Chloride 105 101 - 111 mmol/L   CO2 24 22 - 32 mmol/L   Glucose, Bld 89 65 - 99 mg/dL   BUN 6 6 - 20 mg/dL   Creatinine, Ser 0.64 0.44 - 1.00 mg/dL   Calcium 9.2 8.9 - 10.3 mg/dL   Total Protein 7.9 6.5 - 8.1 g/dL   Albumin 4.2 3.5 - 5.0 g/dL   AST 32 15 - 41 U/L   ALT 195 (H) 14 - 54 U/L   Alkaline Phosphatase 144 (H) 38 - 126 U/L   Total Bilirubin 0.5 0.3 - 1.2 mg/dL   GFR calc non Af Amer >60 >60 mL/min   GFR calc Af Amer >60 >60 mL/min    Comment:  (NOTE) The eGFR has been calculated using the CKD EPI equation. This calculation has not been validated in all clinical situations. eGFR's persistently <60 mL/min signify possible Chronic Kidney Disease.    Anion gap 8 5 - 15  Lipase, blood  Status: None   Collection Time: 11/25/16 10:39 AM  Result Value Ref Range   Lipase 22 11 - 51 U/L   Ct Abdomen Pelvis Wo Contrast  Result Date: 11/25/2016 CLINICAL DATA:  26 year old female with history of intermittent severe mid epigastric pain with nausea and vomiting for the past 2-3 weeks. EXAM: CT ABDOMEN AND PELVIS WITHOUT CONTRAST TECHNIQUE: Multidetector CT imaging of the abdomen and pelvis was performed following the standard protocol without IV contrast. COMPARISON:  Multiple priors, most recently 11/18/2016. FINDINGS: Lower chest: 2 mm pulmonary nodule in the periphery of the left lower lobe (image 6 of series 3), considered benign as this is unchanged to remote prior study 05/17/2014. Hepatobiliary: No cystic or solid hepatic lesions are identified on today's noncontrast CT examination. Status post cholecystectomy. Pancreas: No pancreatic mass or peripancreatic inflammatory changes are identified on today's noncontrast CT examination. Spleen: Unremarkable. Adrenals/Urinary Tract: Bilateral adrenal glands and bilateral kidneys are normal in appearance. No urinary tract calculi noted. No hydroureteronephrosis. Urinary bladder is normal in appearance. Stomach/Bowel: Unenhanced appearance of the stomach is normal. There is no pathologic dilatation of small bowel or colon. Small duodenal diverticulum extending off the posterior aspect of the second/third portion of the duodenum. Normal appendix. Vascular/Lymphatic: No atherosclerotic calcifications or definite aneurysm identified in the abdominal or pelvic vasculature. No lymphadenopathy noted in the abdomen or pelvis. Reproductive: Uterus and ovaries are unremarkable in appearance. Other: No significant  volume of ascites.  No pneumoperitoneum. Musculoskeletal: There are no aggressive appearing lytic or blastic lesions noted in the visualized portions of the skeleton. IMPRESSION: 1. No acute findings in the abdomen or pelvis to account for the patient's symptoms. No significant changes compared to prior CT the abdomen and pelvis 11/18/2016. Electronically Signed   By: Vinnie Langton M.D.   On: 11/25/2016 13:46   Mr 3d Recon At Scanner  Result Date: 11/24/2016 CLINICAL DATA:  26 year old female status post cholecystectomy 08/13/2016, presents with 1 week of right-sided abdominal pain, nausea, vomiting and elevated liver function tests. EXAM: MRI ABDOMEN WITHOUT AND WITH CONTRAST (INCLUDING MRCP) TECHNIQUE: Multiplanar multisequence MR imaging of the abdomen was performed both before and after the administration of intravenous contrast. Heavily T2-weighted images of the biliary and pancreatic ducts were obtained, and three-dimensional MRCP images were rendered by post processing. CONTRAST:  54m MULTIHANCE GADOBENATE DIMEGLUMINE 529 MG/ML IV SOLN COMPARISON:  11/18/2016 unenhanced CT abdomen/pelvis. FINDINGS: Motion degraded scan, particularly on the postcontrast sequences. Lower chest: Clear lung bases. Hepatobiliary: Normal liver size and configuration. Mild diffuse hepatic steatosis. No liver mass. Cholecystectomy. No fluid collections in the cholecystectomy bed. Dilated cystic duct remnant at the 9 mm diameter. No intrahepatic biliary duct dilatation. Common bile duct diameter 4 mm. No evidence of choledocholithiasis. No biliary or ampullary mass. No biliary strictures. Stable small periampullary duodenal diverticulum. Pancreas: No pancreatic mass or duct dilation. Mild heterogeneous fatty infiltration of the uncinate process of the pancreas. No evidence of pancreas divisum. Spleen: Normal size. No mass. Adrenals/Urinary Tract: Normal adrenals. No hydronephrosis. Normal kidneys with no renal mass.  Stomach/Bowel: Grossly normal stomach. Visualized small and large bowel is normal caliber, with no bowel wall thickening. Vascular/Lymphatic: Normal caliber abdominal aorta. Patent portal, splenic, hepatic and renal veins. No pathologically enlarged lymph nodes in the abdomen. Other: No abdominal ascites or focal fluid collection. Musculoskeletal: No aggressive appearing focal osseous lesions. IMPRESSION: 1. Motion degraded scan. 2. No fluid collections in the cholecystectomy bed.  No ascites. 3. No biliary ductal dilatation. Common bile duct  diameter 4 mm. No evidence of choledocholithiasis. Stable small periampullary duodenal diverticulum. 4. Mild diffuse hepatic steatosis. Electronically Signed   By: Ilona Sorrel M.D.   On: 11/24/2016 10:37   Mr Abdomen Mrcp Moise Boring Contast  Result Date: 11/24/2016 CLINICAL DATA:  26 year old female status post cholecystectomy 08/13/2016, presents with 1 week of right-sided abdominal pain, nausea, vomiting and elevated liver function tests. EXAM: MRI ABDOMEN WITHOUT AND WITH CONTRAST (INCLUDING MRCP) TECHNIQUE: Multiplanar multisequence MR imaging of the abdomen was performed both before and after the administration of intravenous contrast. Heavily T2-weighted images of the biliary and pancreatic ducts were obtained, and three-dimensional MRCP images were rendered by post processing. CONTRAST:  39m MULTIHANCE GADOBENATE DIMEGLUMINE 529 MG/ML IV SOLN COMPARISON:  11/18/2016 unenhanced CT abdomen/pelvis. FINDINGS: Motion degraded scan, particularly on the postcontrast sequences. Lower chest: Clear lung bases. Hepatobiliary: Normal liver size and configuration. Mild diffuse hepatic steatosis. No liver mass. Cholecystectomy. No fluid collections in the cholecystectomy bed. Dilated cystic duct remnant at the 9 mm diameter. No intrahepatic biliary duct dilatation. Common bile duct diameter 4 mm. No evidence of choledocholithiasis. No biliary or ampullary mass. No biliary strictures.  Stable small periampullary duodenal diverticulum. Pancreas: No pancreatic mass or duct dilation. Mild heterogeneous fatty infiltration of the uncinate process of the pancreas. No evidence of pancreas divisum. Spleen: Normal size. No mass. Adrenals/Urinary Tract: Normal adrenals. No hydronephrosis. Normal kidneys with no renal mass. Stomach/Bowel: Grossly normal stomach. Visualized small and large bowel is normal caliber, with no bowel wall thickening. Vascular/Lymphatic: Normal caliber abdominal aorta. Patent portal, splenic, hepatic and renal veins. No pathologically enlarged lymph nodes in the abdomen. Other: No abdominal ascites or focal fluid collection. Musculoskeletal: No aggressive appearing focal osseous lesions. IMPRESSION: 1. Motion degraded scan. 2. No fluid collections in the cholecystectomy bed.  No ascites. 3. No biliary ductal dilatation. Common bile duct diameter 4 mm. No evidence of choledocholithiasis. Stable small periampullary duodenal diverticulum. 4. Mild diffuse hepatic steatosis. Electronically Signed   By: JIlona SorrelM.D.   On: 11/24/2016 10:37    Assessment/Plan: Abdominal pain and right upper quadrant pain and there is some component of chest wall pain. I personally believe this is muskuloskeletal and not related to any biliary pathology. Extensive workup including an MRCP, 2 CT scans of the abdomen and pelvis and an ultrasound of the right upper quadrant have failed to show any complications related to her surgery or any pathology related to her biliary tract. I think that this episode of chest pain abdominal pain is musculoskeletal and the patient developed a transient hepatitis related to Tylenol intake. I have explained this to the patient and I encouraged her to avoid any Tylenol, any alcoholic beverage and stay hydrated. I will order a hepatitis panel to rule out viral disease. We'll go ahead and set her up for GI appointment and to follow up on her Transaminitis. No need for  surgical intervention    DCaroleen Hamman MD FCarnegieSurgeon  11/25/2016,3:45 PM

## 2016-11-25 NOTE — Patient Instructions (Signed)
We will have you see the Gastroenterologist (Liver/Stomach/Intestines Doctor) Dr. Tobi BastosAnna. Please see appointment below.  Call with any questions or concerns.

## 2016-11-30 ENCOUNTER — Telehealth: Payer: Self-pay

## 2016-11-30 NOTE — Telephone Encounter (Signed)
CT Abdomen and Pelvis without contrast has been approved for patient.   Authorization # [id number]  Dates of Service: 11/25/16-12/25/16  Sp[facility] with Mardella LaymanLindsey P. [facility]

## 2016-12-15 ENCOUNTER — Ambulatory Visit: Payer: Medicaid Other | Admitting: Gastroenterology

## 2017-01-11 ENCOUNTER — Encounter: Payer: Self-pay | Admitting: *Deleted

## 2017-01-11 ENCOUNTER — Ambulatory Visit: Payer: Medicaid Other | Admitting: Gastroenterology

## 2017-02-14 ENCOUNTER — Emergency Department: Payer: Medicaid Other

## 2017-02-14 ENCOUNTER — Encounter: Payer: Self-pay | Admitting: Intensive Care

## 2017-02-14 ENCOUNTER — Emergency Department
Admission: EM | Admit: 2017-02-14 | Discharge: 2017-02-14 | Disposition: A | Discharge status: Home / Self Care | Payer: Medicaid Other | Attending: Emergency Medicine | Admitting: Emergency Medicine

## 2017-02-14 DIAGNOSIS — J45909 Unspecified asthma, uncomplicated: Secondary | ICD-10-CM | POA: Diagnosis not present

## 2017-02-14 DIAGNOSIS — O24111 Pre-existing diabetes mellitus, type 2, in pregnancy, first trimester: Secondary | ICD-10-CM | POA: Diagnosis not present

## 2017-02-14 DIAGNOSIS — R109 Unspecified abdominal pain: Secondary | ICD-10-CM | POA: Diagnosis present

## 2017-02-14 DIAGNOSIS — Z3A01 Less than 8 weeks gestation of pregnancy: Secondary | ICD-10-CM | POA: Diagnosis not present

## 2017-02-14 DIAGNOSIS — E119 Type 2 diabetes mellitus without complications: Secondary | ICD-10-CM | POA: Insufficient documentation

## 2017-02-14 DIAGNOSIS — Z79899 Other long term (current) drug therapy: Secondary | ICD-10-CM | POA: Diagnosis not present

## 2017-02-14 DIAGNOSIS — O26891 Other specified pregnancy related conditions, first trimester: Secondary | ICD-10-CM | POA: Diagnosis not present

## 2017-02-14 DIAGNOSIS — Z7984 Long term (current) use of oral hypoglycemic drugs: Secondary | ICD-10-CM | POA: Diagnosis not present

## 2017-02-14 DIAGNOSIS — Z3491 Encounter for supervision of normal pregnancy, unspecified, first trimester: Secondary | ICD-10-CM

## 2017-02-14 DIAGNOSIS — F17210 Nicotine dependence, cigarettes, uncomplicated: Secondary | ICD-10-CM | POA: Insufficient documentation

## 2017-02-14 LAB — URINALYSIS, COMPLETE (UACMP) WITH MICROSCOPIC
Bilirubin Urine: NEGATIVE
Glucose, UA: NEGATIVE mg/dL
HGB URINE DIPSTICK: NEGATIVE
Ketones, ur: NEGATIVE mg/dL
Leukocytes, UA: NEGATIVE
NITRITE: NEGATIVE
PROTEIN: NEGATIVE mg/dL
SPECIFIC GRAVITY, URINE: 1.031 — AB (ref 1.005–1.030)
pH: 5 (ref 5.0–8.0)

## 2017-02-14 LAB — COMPREHENSIVE METABOLIC PANEL
ALBUMIN: 3.7 g/dL (ref 3.5–5.0)
ALK PHOS: 57 U/L (ref 38–126)
ALT: 16 U/L (ref 14–54)
AST: 21 U/L (ref 15–41)
Anion gap: 6 (ref 5–15)
BILIRUBIN TOTAL: 0.5 mg/dL (ref 0.3–1.2)
BUN: 6 mg/dL (ref 6–20)
CALCIUM: 8.9 mg/dL (ref 8.9–10.3)
CO2: 22 mmol/L (ref 22–32)
Chloride: 109 mmol/L (ref 101–111)
Creatinine, Ser: 0.61 mg/dL (ref 0.44–1.00)
GFR calc Af Amer: >60 mL/min (ref >60)
GFR calc non Af Amer: >60 mL/min (ref >60)
GLUCOSE: 112 mg/dL — AB (ref 65–99)
POTASSIUM: 3.4 mmol/L — AB (ref 3.5–5.1)
SODIUM: 137 mmol/L (ref 135–145)
TOTAL PROTEIN: 7.3 g/dL (ref 6.5–8.1)

## 2017-02-14 LAB — CBC
HEMATOCRIT: 36.8 % (ref 35.0–47.0)
HEMOGLOBIN: 12 g/dL (ref 12.0–16.0)
MCH: 22.4 pg — AB (ref 26.0–34.0)
MCHC: 32.5 g/dL (ref 32.0–36.0)
MCV: 69.1 fL — ABNORMAL LOW (ref 80.0–100.0)
Platelets: 365 10*3/uL (ref 150–440)
RBC: 5.33 MIL/uL — ABNORMAL HIGH (ref 3.80–5.20)
RDW: 19.3 % — AB (ref 11.5–14.5)
WBC: 10.5 10*3/uL (ref 3.6–11.0)

## 2017-02-14 LAB — LIPASE, BLOOD: Lipase: 19 U/L (ref 11–51)

## 2017-02-14 LAB — HCG, QUANTITATIVE, PREGNANCY: HCG, BETA CHAIN, QUANT, S: 4535 m[IU]/mL — AB (ref <5)

## 2017-02-14 MED ORDER — ACETAMINOPHEN 500 MG PO TABS
1000.0000 mg | ORAL_TABLET | ORAL | Status: AC
  Administered 2017-02-14: 1000 mg via ORAL
  Filled 2017-02-14: qty 2

## 2017-02-14 NOTE — ED Notes (Signed)
Pt states "I just want to leave. I've been here 7 fucking hours." Pt informed that urine does not take that long. Pt repeatedly asking "is the patient in the correct position?" Pt informed by this Rn and Dr. Fanny BienQuale that the US looked good. Pt unhappy at this time. Family at bedside.

## 2017-02-14 NOTE — ED Notes (Signed)
Pt seen leaving room. Multiple co-workers called this Charity fundraiserN to tell this RN that pt was leaving. PT was informed that there was nothing this RN could do to keep pt here and that urine is important to make sure pt doesn't have UTI. Pt walked out with family.

## 2017-02-14 NOTE — ED Notes (Signed)
Informed pt that hcg quant is not back yet. Pt verbalized understanding.
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
## 2017-02-14 NOTE — ED Notes (Signed)
US called about hcg quant. Called lab again since order appeared to be DC'd. They stated they could see the order for it and the test is running.

## 2017-02-14 NOTE — ED Notes (Signed)
Pt taken to US via stretcher

## 2017-02-14 NOTE — ED Notes (Signed)
Dr. Fanny BienQuale asked about hcg quant. This Rn called lab and lab stated they would start running test now.

## 2017-02-14 NOTE — ED Notes (Signed)
Dr. Fanny BienQuale at bedside. Pt was informed upon entering room that this RN needed a urine sample. Dr. Fanny BienQuale informed pt that is the only thing that she is waiting on at current time. Pt states "I peed 3 times already. I've been here for fucking ever." Pt informed that this RN needed a urine sample and pt given cup and walked to toilet by self.

## 2017-02-14 NOTE — Discharge Instructions (Signed)
Please return to the emergency room right away if you are to develop a fever, severe nausea, your pain becomes severe or worsens, you have vaginal bleeding, you are unable to keep food down, begin vomiting any dark or bloody fluid, you develop any dark or bloody stools, feel dehydrated, or other new concerns or symptoms arise.  [facility] (Dr. Elesa MassedWard) within 2 to 3 days. Call to schedule an appointment.

## 2017-02-16 LAB — URINE CULTURE: SPECIAL REQUESTS: NORMAL

## 2017-03-02 ENCOUNTER — Ambulatory Visit (INDEPENDENT_AMBULATORY_CARE_PROVIDER_SITE_OTHER): Payer: Medicaid Other | Admitting: Advanced Practice Midwife

## 2017-03-02 ENCOUNTER — Encounter: Payer: Self-pay | Admitting: Advanced Practice Midwife

## 2017-03-02 VITALS — BP 120/80 | Ht 60.0 in | Wt 225.0 lb

## 2017-03-02 DIAGNOSIS — O219 Vomiting of pregnancy, unspecified: Secondary | ICD-10-CM

## 2017-03-02 DIAGNOSIS — Z113 Encounter for screening for infections with a predominantly sexual mode of transmission: Secondary | ICD-10-CM | POA: Diagnosis not present

## 2017-03-02 DIAGNOSIS — E669 Obesity, unspecified: Secondary | ICD-10-CM

## 2017-03-02 DIAGNOSIS — Z6841 Body Mass Index (BMI) 40.0 and over, adult: Secondary | ICD-10-CM | POA: Diagnosis not present

## 2017-03-02 DIAGNOSIS — O0990 Supervision of high risk pregnancy, unspecified, unspecified trimester: Secondary | ICD-10-CM | POA: Diagnosis not present

## 2017-03-02 DIAGNOSIS — Z1379 Encounter for other screening for genetic and chromosomal anomalies: Secondary | ICD-10-CM

## 2017-03-02 DIAGNOSIS — Z3201 Encounter for pregnancy test, result positive: Secondary | ICD-10-CM

## 2017-03-02 DIAGNOSIS — IMO0001 Reserved for inherently not codable concepts without codable children: Secondary | ICD-10-CM

## 2017-03-02 LAB — POCT URINE PREGNANCY: Preg Test, Ur: POSITIVE — AB

## 2017-03-02 MED ORDER — DOXYLAMINE-PYRIDOXINE 10-10 MG PO TBEC: 2.0000 | DELAYED_RELEASE_TABLET | Freq: Every day | ORAL | 4 refills | Status: DC

## 2017-03-02 NOTE — Addendum Note (Signed)
Addended by: Tresea MallGLEDHILL, Erez Mccallum on: 03/02/2017 03:30 PM   Modules accepted: Orders

## 2017-03-02 NOTE — Progress Notes (Signed)
Here for NOB. C/o nausea and vomiting. Rx of diclegis sent. 1st trimester screen in 4 weeks.        New Obstetric Patient H&P    Chief Complaint: "Desires prenatal care"   History of Present Illness: Patient is a 26 y.o. G5P1030 Not Hispanic or Latino female, LMP 12/28/2016 presents with amenorrhea and positive home pregnancy test. Based on 5 week u/s, her EDD is Estimated Date of Delivery: 10/15/17 and her EGA is [redacted]w[redacted]d Cycles are 1-2 weeks, irregular, and occur approximately every : 1-2 months. Her last pap smear was less than a year ago and was no abnormalities.    She had a urine pregnancy test which was positive 3 week(s)  ago. Her last menstrual period was normal and lasted for  7 day(s). Since her LMP she claims she has experienced breast tenderness, fatigue, nausea, vomiting. She denies vaginal bleeding. Her past medical history is contributory for epilepsy, depression, ADD, obesity, oral medication controlled DM. Her prior pregnancies are notable for cesarean section  Since her LMP, she admits to the use of tobacco products  Yes. She smokes 2-3 cigarettes/day She claims she has gained  5 pounds since the start of her pregnancy.  There are cats in the home in the home  no  She admits close contact with children on a regular basis  yes  She has had chicken pox in the past unknown She has had Tuberculosis exposures, symptoms, or previously tested positive for TB   no Current or past history of domestic violence. no  Genetic Screening/Teratology Counseling: (Includes patient, baby's father, or anyone in either family with:)   [facility] 2. Thalassemia ([place] or [place] background): MCV<80  no 3. Neural tube defect (meningomyelocele, spina bifida, anencephaly)  no 4. Congenital heart defect  no  5. Down syndrome  no 6. Tay-Sachs (Jewish, [place]  no 7. Canavan's Disease  no 8. Sickle cell disease or trait (African)  no  9.  Hemophilia or other blood disorders  no  10. Muscular dystrophy  no  11. Cystic fibrosis  no  12. Huntington's Chorea  no  13. Mental retardation/autism  no 14. Other inherited genetic or chromosomal disorder  no 15. Maternal metabolic disorder (DM, PKU, etc)  no 16. Patient or FOB with a child with a birth defect not listed above no  16a. Patient or FOB with a birth defect themselves no 17. Recurrent pregnancy loss, or stillbirth  no  18. Any medications since LMP other than prenatal vitamins (include vitamins, supplements, OTC meds, drugs, alcohol)  no 19. Any other genetic/environmental exposure to discuss  no  Infection History:   1. Lives with someone with TB or TB exposed  no  2. Patient or partner has history of genital herpes  no 3. Rash or viral illness since LMP  no 4. History of STI (GC, CT, HPV, syphilis, HIV)  no 5. History of recent travel :  no  Other pertinent information:  no     Review of Systems:10 point review of systems negative unless otherwise noted in HPI  Past Medical History:  Past Medical History:  Diagnosis Date  . Anemia    BLOOD TRANSFUSION AFTER C-SECTION ON 06-2016-2 UNITS  . Asthma    WELL CONTROLLED  . Depression   . Diabetes mellitus [place] complication ([place]   . Gallstones   . GERD (gastroesophageal reflux disease)   . Heart murmur    AS  A CHILD  . Migraines   . Ovarian cyst   . Seizures ([place])    LAST SEIZURE 2016  . Tubal pregnancy     Past Surgical History:  Past Surgical History:  Procedure Laterality Date  . CESAREAN SECTION N/A 06/22/2016   Procedure: CESAREAN SECTION;  Surgeon: Malachy Mood, MD;  Location: [facility] ORS;  Service: Obstetrics;  Laterality: N/A;  . CHOLECYSTECTOMY N/A 08/13/2016   Procedure: LAPAROSCOPIC CHOLECYSTECTOMY;  Surgeon: Jules Husbands, MD;  Location: [facility] ORS;  Service: General;  Laterality: N/A;  . DILATION AND CURETTAGE OF UTERUS    . ovarian cyst removed    . SALPINGECTOMY Right 2013   ectopic  pregnancy  . TONSILLECTOMY    . TONSILLECTOMY AND ADENOIDECTOMY      Gynecologic History: Patient's last menstrual period was 12/28/2016 (approximate).  Obstetric History: E0P2330  Family History:  Family History  Problem Relation Age of Onset  . Heart disease Father   . Huntington's disease Mother     Social History:  Social History   Social History  . Marital status: Single    Spouse name: N/A  . Number of children: N/A  . Years of education: N/A   Occupational History  . Not on file.   Social History Main Topics  . Smoking status: Current Every Day Smoker    Packs/day: 0.25    Years: 3.00    Types: Cigarettes  . Smokeless tobacco: Never Used  . Alcohol use No  . Drug use: No  . Sexual activity: Yes    Birth control/ protection: None   Other Topics Concern  . Not on file   Social History Narrative  . No narrative on file    Allergies:  Allergies  Allergen Reactions  . Ivp Dye [Iodinated Diagnostic Agents] Anaphylaxis  . Morphine Other (See Comments)    bradycradia bradycradia  . Sulfasalazine Hives  . Tomato Anaphylaxis  . Toradol [Ketorolac Tromethamine] Anaphylaxis, Hives and Swelling  . Dilaudid [Hydromorphone Hcl] Hives  . Latex Rash  . Sulfa Antibiotics Hives  . Tramadol Swelling, Rash and Hives  . Hydrocodone-Acetaminophen Other (See Comments)  . Iohexol Other (See Comments)    Pt. States she can not take iohexol but won't state reaction type. Pt. States she can not take iohexol but won't state reaction type. Pt. States she can not take iohexol but won't state reaction type.  . Lamictal [Lamotrigine] Other (See Comments)  . Mushroom Extract Complex Rash    Medications: Prior to Admission medications   Medication Sig Start Date End Date Taking? Authorizing Provider  albuterol (PROVENTIL HFA;VENTOLIN HFA) 108 (90 BASE) MCG/ACT inhaler Inhale 2 puffs into the lungs every 6 (six) hours as needed. For shortness of breath   Yes [provider]  blood glucose meter kit and supplies KIT Dispense based on patient and insurance preference. Use up to four times daily as directed. (FOR ICD-9 250.00, 250.01). 06/18/15  Yes Ahmed Prima, MD  metFORMIN (GLUCOPHAGE) 500 MG tablet Take 1,500 mg by mouth daily with breakfast.   Yes [provider]  Doxylamine-Pyridoxine (DICLEGIS) 10-10 MG TBEC Take 2 tablets by mouth at bedtime. If symptoms persist, add one tablet in the morning and one in the afternoon 03/02/17   Rod Can, CNM  ferrous sulfate (FERROUSUL) 325 (65 FE) MG tablet Take 1 tablet (325 mg total) by mouth 2 (two) times daily. Patient not taking: Reported on 03/02/2017 06/25/16   Malachy Mood, MD  folic acid (FOLVITE) 1  MG tablet Take 3 mg by mouth daily.    [provider]  lidocaine (LIDODERM) 5 % Place 1 patch onto the skin daily. Patient not taking: Reported on 03/02/2017 11/18/16   Daymon Larsen, MD  ranitidine (ZANTAC) 150 MG tablet Take 1 tablet (150 mg total) by mouth at bedtime. 06/18/15   Ahmed Prima, MD    Physical Exam Vitals: Blood pressure 120/80, height 5' (1.524 m), weight 225 lb (102.1 kg), last menstrual period 12/28/2016, not currently breastfeeding.  General: NAD HEENT: normocephalic, anicteric Thyroid: no enlargement, no palpable nodules Pulmonary: No increased work of breathing, CTAB Cardiovascular: RRR, distal pulses 2+ Abdomen: NABS, soft, non-tender, non-distended.  Umbilicus without lesions.  No hepatomegaly, splenomegaly or masses palpable. No evidence of hernia  Genitourinary:  External: Normal external female genitalia.  Normal urethral meatus, normal  Bartholin's and Skene's glands.    Vagina: Normal vaginal mucosa, no evidence of prolapse.    Cervix: Grossly normal in appearance, no bleeding, no CMT  Uterus: Enlarged, mobile, normal contour.    Adnexa: ovaries non-enlarged, no adnexal masses  Rectal: deferred Extremities: no edema, erythema, or  tenderness Neurologic: Grossly intact Psychiatric: mood appropriate, affect full   Assessment: 26 y.o. G5P1030 at 14w4dpresenting to initiate prenatal care  Plan: 1) Avoid alcoholic beverages. 2) Patient encouraged not to smoke.  3) Discontinue the use of all non-medicinal drugs and chemicals.  4) Take prenatal vitamins daily.  5) Nutrition, food safety (fish, cheese advisories, and high nitrite foods) and exercise discussed. 6) Hospital and practice style discussed with cross coverage system.  7) Genetic Screening, such as with 1st Trimester Screening, cell free fetal DNA, AFP testing, and Ultrasound, as well as with amniocentesis and CVS as appropriate, is discussed with patient. At the conclusion of today's visit patient requested genetic testing 8) Patient is asked about travel to areas at risk for the Zika virus, and counseled to avoid travel and exposure to mosquitoes or sexual partners who may have themselves been exposed to the virus. Testing is discussed, and will be ordered as appropriate.  9) Continue checking Blood Sugar 4 times daily and metformin as prescribed   JRod Can CNM

## 2017-03-02 NOTE — Patient Instructions (Signed)
Prenatal Care WHAT IS PRENATAL CARE? Prenatal care is the process of caring for a pregnant woman before she gives birth. Prenatal care makes sure that she and her baby remain as healthy as possible throughout pregnancy. Prenatal care may be provided by a midwife, family practice health care provider, or a childbirth and pregnancy specialist (obstetrician). Prenatal care may include physical examinations, testing, treatments, and education on nutrition, lifestyle, and social support services. WHY IS PRENATAL CARE SO IMPORTANT? Early and consistent prenatal care increases the chance that you and your baby will remain healthy throughout your pregnancy. This type of care also decreases a baby's risk of being born too early (prematurely), or being born smaller than expected (small for gestational age). Any underlying medical conditions you may have that could pose a risk during your pregnancy are discussed during prenatal care visits. You will also be monitored regularly for any new conditions that may arise during your pregnancy so they can be treated quickly and effectively. WHAT HAPPENS DURING PRENATAL CARE VISITS? Prenatal care visits may include the following: Discussion Tell your health care provider about any new signs or symptoms you have experienced since your last visit. These might include:  Nausea or vomiting.  Increased or decreased level of energy.  Difficulty sleeping.  Back or leg pain.  Weight changes.  Frequent urination.  Shortness of breath with physical activity.  Changes in your skin, such as the development of a rash or itchiness.  Vaginal discharge or bleeding.  Feelings of excitement or nervousness.  Changes in your baby's movements.  You may want to write down any questions or topics you want to discuss with your health care provider and bring them with you to your appointment. Examination During your first prenatal care visit, you will likely have a complete  physical exam. Your health care provider will often examine your vagina, cervix, and the position of your uterus, as well as check your heart, lungs, and other body systems. As your pregnancy progresses, your health care provider will measure the size of your uterus and your baby's position inside your uterus. He or she may also examine you for early signs of labor. Your prenatal visits may also include checking your blood pressure and, after about 10-12 weeks of pregnancy, listening to your baby's heartbeat. Testing Regular testing often includes:  Urinalysis. This checks your urine for glucose, protein, or signs of infection.  Blood count. This checks the levels of white and red blood cells in your body.  Tests for sexually transmitted infections (STIs). Testing for STIs at the beginning of pregnancy is routinely done and is required in many states.  Antibody testing. You will be checked to see if you are immune to certain illnesses, such as rubella, that can affect a developing fetus.  Glucose screen. Around 24-28 weeks of pregnancy, your blood glucose level will be checked for signs of gestational diabetes. Follow-up tests may be recommended.  Group B strep. This is a bacteria that is commonly found inside a woman's vagina. This test will inform your health care provider if you need an antibiotic to reduce the amount of this bacteria in your body prior to labor and childbirth.  Ultrasound. Many pregnant women undergo an ultrasound screening around 18-20 weeks of pregnancy to evaluate the health of the fetus and check for any developmental abnormalities.  HIV (human immunodeficiency virus) testing. Early in your pregnancy, you will be screened for HIV. If you are at high risk for HIV, this test may   be repeated during your third trimester of pregnancy.  You may be offered other testing based on your age, personal or family medical history, or other factors. HOW OFTEN SHOULD I PLAN TO SEE MY  HEALTH CARE PROVIDER FOR PRENATAL CARE? Your prenatal care check-up schedule depends on any medical conditions you have before, or develop during, your pregnancy. If you do not have any underlying medical conditions, you will likely be seen for checkups:  Monthly, during the first 6 months of pregnancy.  Twice a month during months 7 and 8 of pregnancy.  Weekly starting in the 9th month of pregnancy and until delivery.  If you develop signs of early labor or other concerning signs or symptoms, you may need to see your health care provider more often. Ask your health care provider what prenatal care schedule is best for you. WHAT CAN I DO TO KEEP MYSELF AND MY BABY AS HEALTHY AS POSSIBLE DURING MY PREGNANCY?  Take a prenatal vitamin containing 400 micrograms (0.4 mg) of folic acid every day. Your health care provider may also ask you to take additional vitamins such as iodine, vitamin D, iron, copper, and zinc.  Take 1500-2000 mg of calcium daily starting at your 20th week of pregnancy until you deliver your baby.  Make sure you are up to date on your vaccinations. Unless directed otherwise by your health care provider: ? You should receive a tetanus, diphtheria, and pertussis (Tdap) vaccination between the 27th and 36th week of your pregnancy, regardless of when your last Tdap immunization occurred. This helps protect your baby from whooping cough (pertussis) after he or she is born. ? You should receive an annual inactivated influenza vaccine (IIV) to help protect you and your baby from influenza. This can be done at any point during your pregnancy.  Eat a well-rounded diet that includes: ? Fresh fruits and vegetables. ? Lean proteins. ? Calcium-rich foods such as milk, yogurt, hard cheeses, and dark, leafy greens. ? Whole grain breads.  Do noteat seafood high in mercury, including: ? Swordfish. ? Tilefish. ? Shark. ? King mackerel. ? More than 6 oz tuna per week.  Do not  eat: ? Raw or undercooked meats or eggs. ? Unpasteurized foods, such as soft cheeses (brie, blue, or feta), juices, and milks. ? Lunch meats. ? Hot dogs that have not been heated until they are steaming.  Drink enough water to keep your urine clear or pale yellow. For many women, this may be 10 or more 8 oz glasses of water each day. Keeping yourself hydrated helps deliver nutrients to your baby and may prevent the start of pre-term uterine contractions.  Do not use any tobacco products including cigarettes, chewing tobacco, or electronic cigarettes. If you need help quitting, ask your health care provider.  Do not drink beverages containing alcohol. No safe level of alcohol consumption during pregnancy has been determined.  Do not use any illegal drugs. These can harm your developing baby or cause a miscarriage.  Ask your health care provider or pharmacist before taking any prescription or over-the-counter medicines, herbs, or supplements.  Limit your caffeine intake to no more than 200 mg per day.  Exercise. Unless told otherwise by your health care provider, try to get 30 minutes of moderate exercise most days of the week. Do not  do high-impact activities, contact sports, or activities with a high risk of falling, such as horseback riding or downhill skiing.  Get plenty of rest.  Avoid anything that raises your   body temperature, such as hot tubs and saunas.  If you own a cat, do not empty its litter box. Bacteria contained in cat feces can cause an infection called toxoplasmosis. This can result in serious harm to the fetus.  Stay away from chemicals such as insecticides, lead, mercury, and cleaning or paint products that contain solvents.  Do not have any X-rays taken unless medically necessary.  Take a childbirth and breastfeeding preparation class. Ask your health care provider if you need a referral or recommendation.  This information is not intended to replace advice given  to you by your health care provider. Make sure you discuss any questions you have with your health care provider. Document Released: 09/24/2003 Document Revised: 02/24/2016 Document Reviewed: 12/06/2013 Elsevier Interactive Patient Education  2017 Elsevier Inc.  

## 2017-03-04 LAB — GC/CHLAMYDIA PROBE AMP
CHLAMYDIA, DNA PROBE: NEGATIVE
NEISSERIA GONORRHOEAE BY PCR: NEGATIVE

## 2017-03-04 LAB — URINE CULTURE

## 2017-03-16 ENCOUNTER — Telehealth: Payer: Self-pay

## 2017-03-16 NOTE — Telephone Encounter (Signed)
Pt calling to see if she can take miralax for constipation.  Adv it's not on the list.  Adv metamusil, fibrercon, or citracel.  Inc fiber c bran cereals, oatmeal, leafy greens, and prunes.  Some pts heat prune juice and have good results c that.  Also adv colace 100mg  bid.  Inc water intake, eat fresh fruits and vegetables.

## 2017-03-30 ENCOUNTER — Ambulatory Visit (INDEPENDENT_AMBULATORY_CARE_PROVIDER_SITE_OTHER): Payer: Medicaid Other

## 2017-03-30 ENCOUNTER — Encounter: Payer: Self-pay | Admitting: Obstetrics and Gynecology

## 2017-03-30 ENCOUNTER — Ambulatory Visit (INDEPENDENT_AMBULATORY_CARE_PROVIDER_SITE_OTHER): Payer: Medicaid Other | Admitting: Obstetrics and Gynecology

## 2017-03-30 VITALS — BP 124/78 | Wt 224.0 lb

## 2017-03-30 DIAGNOSIS — Z1379 Encounter for other screening for genetic and chromosomal anomalies: Secondary | ICD-10-CM | POA: Diagnosis not present

## 2017-03-30 DIAGNOSIS — E669 Obesity, unspecified: Secondary | ICD-10-CM

## 2017-03-30 DIAGNOSIS — IMO0001 Reserved for inherently not codable concepts without codable children: Secondary | ICD-10-CM

## 2017-03-30 DIAGNOSIS — Z6841 Body Mass Index (BMI) 40.0 and over, adult: Secondary | ICD-10-CM

## 2017-03-30 DIAGNOSIS — Z3A11 11 weeks gestation of pregnancy: Secondary | ICD-10-CM

## 2017-03-30 DIAGNOSIS — O24119 Pre-existing diabetes mellitus, type 2, in pregnancy, unspecified trimester: Secondary | ICD-10-CM

## 2017-03-30 DIAGNOSIS — O0990 Supervision of high risk pregnancy, unspecified, unspecified trimester: Secondary | ICD-10-CM

## 2017-03-30 NOTE — Progress Notes (Signed)
U/s today, too early for NT. No vb. No lof.  +Nausea  T2DM: Did not bring BG log. States her value are reasonably controlled.  Needs ophthomology appointment. Discussed with patient. She will also need fetal ECHO at 22 weeks.

## 2017-04-09 ENCOUNTER — Other Ambulatory Visit (INDEPENDENT_AMBULATORY_CARE_PROVIDER_SITE_OTHER): Payer: Medicaid Other

## 2017-04-09 ENCOUNTER — Ambulatory Visit (INDEPENDENT_AMBULATORY_CARE_PROVIDER_SITE_OTHER): Payer: Medicaid Other | Admitting: Obstetrics & Gynecology

## 2017-04-09 VITALS — BP 110/80 | Wt 229.0 lb

## 2017-04-09 DIAGNOSIS — O24119 Pre-existing diabetes mellitus, type 2, in pregnancy, unspecified trimester: Secondary | ICD-10-CM

## 2017-04-09 DIAGNOSIS — O0990 Supervision of high risk pregnancy, unspecified, unspecified trimester: Secondary | ICD-10-CM

## 2017-04-09 DIAGNOSIS — Z3A13 13 weeks gestation of pregnancy: Secondary | ICD-10-CM

## 2017-04-09 MED ORDER — CHROMAGEN PO CAPS: 1.0000 | ORAL_CAPSULE | Freq: Every day | ORAL | 11 refills | Status: DC

## 2017-04-09 MED ORDER — GLYBURIDE 2.5 MG PO TABS: 2.5000 mg | ORAL_TABLET | Freq: Two times a day (BID) | ORAL | 6 refills | Status: DC

## 2017-04-09 NOTE — Patient Instructions (Signed)
Glyburide tablets What is this medicine? GLYBURIDE (GLYE byoor ide) helps to treat type 2 diabetes. Treatment is combined with diet and exercise. The medicine helps your body to use insulin better. This medicine may be used for other purposes; ask your health care provider or pharmacist if you have questions. COMMON BRAND NAME(S): Diabeta, Glycron, Glynase PresTab, Micronase What should I tell my health care provider before I take this medicine? They need to know if you have any of these conditions: -diabetic ketoacidosis -glucose-6-phosphate dehydrogenase deficiency -heart disease -kidney disease -liver disease -severe infection or injury -thyroid disease -an unusual or allergic reaction to glyburide, sulfa drugs, other medicines, foods, dyes, or preservatives -pregnant or trying to get pregnant -breast-feeding How should I use this medicine? Take this medicine by mouth with a glass of water. Follow the directions on the prescription label. If you take this medicine once a day, take it with breakfast or the first main meal of the day. Take your medicine at the same time each day. Do not take more often than directed. Talk to your pediatrician regarding the use of this medicine in children. Special care may be needed. Elderly patients over 26 years old may have a stronger reaction and need a smaller dose. Overdosage: If you think you have taken too much of this medicine contact a poison control center or emergency room at once. NOTE: This medicine is only for you. Do not share this medicine with others. What if I miss a dose? If you miss a dose, take it as soon as you can. If it is almost time for your next dose, take only that dose. Do not take double or extra doses. What may interact with this medicine? -bosentan -chloramphenicol -cisapride -clarithromycin -medicines for fungal or yeast infections -metoclopramide -probenecid -rifampin -warfarin Many medications may cause an  increase or decrease in blood sugar, these include: -alcohol containing beverages -angiotensin converting enzyme inhibitors like enalapril, captopril, and lisinopril -aspirin and aspirin-like drugs -chloramphenicol -chromium -female hormones, like estrogens or progestins and birth control pills -fluoxetine -heart medicines like disopyramide -isoniazid -female hormones or anabolic steroids -medicines called MAO Inhibitors like Nardil, Parnate, Marplan, Eldepryl -medicines for allergies, asthma, cold, or cough -medicines for mental problems -medicines for weight loss -niacin -NSAIDs, medicines for pain and inflammation, like ibuprofen or naproxen -pentamidine -phenytoin -probenecid -quinolone antibiotics like ciprofloxacin, levofloxacin, ofloxacin -some herbal dietary supplements -steroid medicines like prednisone or cortisone -thyroid medicine -water pills or diuretics This list may not describe all possible interactions. Give your health care provider a list of all the medicines, herbs, non-prescription drugs, or dietary supplements you use. Also tell them if you smoke, drink alcohol, or use illegal drugs. Some items may interact with your medicine. What should I watch for while using this medicine? Visit your doctor or health care professional for regular checks on your progress. A test called the HbA1C (A1C) will be monitored. This is a simple blood test. It measures your blood sugar control over the last 2 to 3 months. You will receive this test every 3 to 6 months. Learn how to check your blood sugar. Learn the symptoms of low and high blood sugar and how to manage them. Always carry a quick-source of sugar with you in case you have symptoms of low blood sugar. Examples include hard sugar candy or glucose tablets. Make sure others know that you can choke if you eat or drink when you develop serious symptoms of low blood sugar, such as seizures or unconsciousness.  They must get medical  help at once. Tell your doctor or health care professional if you have high blood sugar. You might need to change the dose of your medicine. If you are sick or exercising more than usual, you might need to change the dose of your medicine. Do not skip meals. Ask your doctor or health care professional if you should avoid alcohol. Many nonprescription cough and cold products contain sugar or alcohol. These can affect blood sugar. This medicine can make you more sensitive to the sun. Keep out of the sun. If you cannot avoid being in the sun, wear protective clothing and use sunscreen. Do not use sun lamps or tanning beds/booths. Wear a medical ID bracelet or chain, and carry a card that describes your disease and details of your medicine and dosage times. What side effects may I notice from receiving this medicine? Side effects that you should report to your doctor or health care professional as soon as possible: -allergic reactions like skin rash, itching or hives, swelling of the face, lips, or tongue -breathing problems -dark urine -fever, chills, sore throat -signs and symptoms of low blood sugar such as feeling anxious, confusion, dizziness, increased hunger, unusually weak or tired, sweating, shakiness, cold, irritable, headache, blurred vision, fast heartbeat, loss of consciousness -unusual bleeding or bruising -yellowing of the eyes or skin Side effects that usually do not require medical attention (report to your doctor or health care professional if they continue or are bothersome): -diarrhea -dizziness -headache -heartburn -nausea -stomach gas This list may not describe all possible side effects. Call your doctor for medical advice about side effects. You may report side effects to FDA at 1-800-FDA-1088. Where should I keep my medicine? Keep out of the reach of children. Store at room temperature between 15 and 30 degrees C (59 and 86 degrees F). Throw away any unused medicine after  the expiration date. NOTE: This sheet is a summary. It may not cover all possible information. If you have questions about this medicine, talk to your doctor, pharmacist, or health care provider.  2018 Elsevier/Gold Standard (2013-01-04 14:44:31)

## 2017-04-09 NOTE — Progress Notes (Signed)
BMI >=40 and Diabetes at start of pregnancy (had GDM last preg last year) Prior CS 06/2016 [x ] u/s for dating and first screen today discussed; EDC 10/15/17, plan CS prior         Counseled as to VBAC vs CS  [ x] nutritional goals [x ] folic acid 1mg  [ ]  bASA (>12 weeks) [ ]  consider nutrition consult [ ]  consider maternal EKG 1st trimester [ ]  Growth u/s 28 [ ] , 32 [ ] , 36 weeks [ ]  [ ]  NST/AFI weekly 36+ weeks (36[] , 37[] , 38[] , 39[] , 40[] ) [ ]  DP consult. Was on Metformin but BS are high esp 2 hours after eating (120-180s)    Change to Glyburide today, 2.5 mg BID    DP may change to Insulin Change iron as had GI SE to ferrous sulfate  Annamarie MajorPaul Advika Mclelland, MD, Merlinda FrederickFACOG [facility], [facility] 04/09/2017  10:24 AM

## 2017-04-14 LAB — FIRST TRIMESTER SCREEN W/NT
CRL: 67.9 mm
DIA MOM: 1.59
DIA VALUE: 285 pg/mL
Gest Age-Collect: 12.9 weeks
HCG MOM: 1.28
Maternal Age At EDD: 27 yr
Nuchal Translucency MoM: 1.01
Nuchal Translucency: 1.7 mm
Number of Fetuses: 1
PAPP-A MoM: 1.36
PAPP-A VALUE: 857.8 ng/mL
Test Results:: NEGATIVE
Weight: 229 [lb_av]
hCG Value: 82.2 IU/mL

## 2017-04-19 ENCOUNTER — Telehealth: Payer: Self-pay

## 2017-04-19 ENCOUNTER — Ambulatory Visit: Admission: RE | Admit: 2017-04-19 | Payer: Medicaid Other | Source: Ambulatory Visit

## 2017-04-19 ENCOUNTER — Ambulatory Visit (INDEPENDENT_AMBULATORY_CARE_PROVIDER_SITE_OTHER): Payer: Medicaid Other | Admitting: Obstetrics & Gynecology

## 2017-04-19 VITALS — BP 110/70 | Wt 226.0 lb

## 2017-04-19 DIAGNOSIS — J019 Acute sinusitis, unspecified: Secondary | ICD-10-CM

## 2017-04-19 DIAGNOSIS — O09299 Supervision of pregnancy with other poor reproductive or obstetric history, unspecified trimester: Secondary | ICD-10-CM

## 2017-04-19 DIAGNOSIS — Z862 Personal history of diseases of the blood and blood-forming organs and certain disorders involving the immune mechanism: Secondary | ICD-10-CM | POA: Insufficient documentation

## 2017-04-19 HISTORY — DX: Supervision of pregnancy with other poor reproductive or obstetric history, unspecified trimester: O09.299

## 2017-04-19 NOTE — Telephone Encounter (Signed)
Pt no showed.

## 2017-04-19 NOTE — Progress Notes (Signed)
Pt seen for congestion and cough Mucinex and Robitussin no help Denies fever Pregnancy 14 weeks; Diabetes on Glyburide    Reports BS improving, no log today    Sees DP MFM soon for co-mgt  PMHx: She  has a past medical history of ADHD; Anemia; Anxiety; Asthma; Bipolar 1 disorder, depressed (HCC); Depression; Diabetes mellitus without complication (HCC); Gallstones; GERD (gastroesophageal reflux disease); Heart murmur; Migraines; Ovarian cyst; Seizures (HCC); and Tubal pregnancy. Also,  has a past surgical history that includes Dilation and curettage of uterus; ovarian cyst removed; Tonsillectomy; Tonsillectomy and adenoidectomy; Salpingectomy (Right, 2013); Cesarean section (N/A, 06/22/2016); and Cholecystectomy (N/A, 08/13/2016)., family history includes Heart disease in her father; Huntington's disease in her mother.,  reports that she has been smoking Cigarettes.  She has a 0.75 pack-year smoking history. She has never used smokeless tobacco. She reports that she does not drink alcohol or use drugs. No outpatient prescriptions have been marked as taking for the 04/19/17 encounter (Routine Prenatal) with Nadara MustardHarris, Domanique Huesman P, MD.  . Also, is allergic to ivp dye [iodinated diagnostic agents]; morphine; sulfasalazine; tomato; toradol [ketorolac tromethamine]; dilaudid [hydromorphone hcl]; latex; sulfa antibiotics; tramadol; hydrocodone-acetaminophen; iohexol; lamictal [lamotrigine]; and mushroom extract complex..  Review of Systems  Constitutional: Negative for chills, fever and malaise/fatigue.  HENT: Negative for congestion, sinus pain and sore throat.   Eyes: Negative for blurred vision and pain.  Respiratory: Negative for cough and wheezing.   Cardiovascular: Negative for chest pain and leg swelling.  Gastrointestinal: Negative for abdominal pain, constipation, diarrhea, heartburn, nausea and vomiting.  Genitourinary: Negative for dysuria, frequency, hematuria and urgency.  Musculoskeletal: Negative  for back pain, joint pain, myalgias and neck pain.  Skin: Negative for itching and rash.  Neurological: Negative for dizziness, tremors and weakness.  Endo/Heme/Allergies: Does not bruise/bleed easily.  Psychiatric/Behavioral: Negative for depression. The patient is not nervous/anxious and does not have insomnia.    Physical examination Constitutional NAD, Conversant  Skin No rashes, lesions or ulceration. Normal palpated skin turgor. No nodularity.  Lungs: Clear to auscultation.No rales or wheezes. Normal Respiratory effort, no retractions.  Heart: NSR.  No murmurs or rubs appreciated. No periferal edema  Abdomen: Gravid.  Non-tender.  No masses.  No HSM. No hernia  Extremities: Moves all appropriately.  Normal ROM for age. No lymphadenopathy.  Neuro: Grossly intact  Psych: Oriented to PPT.  Normal mood. Normal affect.  FHT 150s  A/P: Sinusitis, non-bacterial Sudafed for congestion sx's Tylenol Monitor for fever Keep f/u appt's  Annamarie MajorPaul Alfonsa Vaile, MD, Merlinda FrederickFACOG [facility], [facility] 04/19/2017  3:15 PM

## 2017-04-19 NOTE — Telephone Encounter (Signed)
Pt transferred from front desk & is  inquiring if ok to take Theraflu. She is 3422w3d. Pt has had head & chest congestion x4 days. She has been taking OTC guaifenesin & Robitussin w/o chest relief. She has had some improvement w/nasal drainage improvement. Nasal drainage is clear. She is unable to get rid of chest congestion. Has coughed up green mucus a few times. No fever. Pt has asthma & uses nebulizer. Pulmonologist & Urgent will not see d/t pregnancy. Apt made w/[facility] @2  today.

## 2017-04-26 ENCOUNTER — Institutional Professional Consult (permissible substitution): Payer: Self-pay

## 2017-04-26 ENCOUNTER — Ambulatory Visit (INDEPENDENT_AMBULATORY_CARE_PROVIDER_SITE_OTHER): Payer: Medicaid Other | Admitting: Obstetrics and Gynecology

## 2017-04-26 ENCOUNTER — Ambulatory Visit
Admission: RE | Admit: 2017-04-26 | Discharge: 2017-04-26 | Disposition: A | Discharge status: Home / Self Care | Payer: Medicaid Other | Source: Ambulatory Visit | Attending: Maternal & Fetal Medicine | Admitting: Maternal & Fetal Medicine

## 2017-04-26 VITALS — BP 112/62 | Wt 230.0 lb

## 2017-04-26 DIAGNOSIS — O24919 Unspecified diabetes mellitus in pregnancy, unspecified trimester: Secondary | ICD-10-CM | POA: Diagnosis not present

## 2017-04-26 DIAGNOSIS — Z79899 Other long term (current) drug therapy: Secondary | ICD-10-CM | POA: Insufficient documentation

## 2017-04-26 DIAGNOSIS — Z6841 Body Mass Index (BMI) 40.0 and over, adult: Secondary | ICD-10-CM

## 2017-04-26 DIAGNOSIS — O0990 Supervision of high risk pregnancy, unspecified, unspecified trimester: Secondary | ICD-10-CM

## 2017-04-26 DIAGNOSIS — Z3A16 16 weeks gestation of pregnancy: Secondary | ICD-10-CM | POA: Insufficient documentation

## 2017-04-26 DIAGNOSIS — O09299 Supervision of pregnancy with other poor reproductive or obstetric history, unspecified trimester: Secondary | ICD-10-CM

## 2017-04-26 DIAGNOSIS — E669 Obesity, unspecified: Secondary | ICD-10-CM

## 2017-04-26 DIAGNOSIS — Z888 Allergy status to other drugs, medicaments and biological substances status: Secondary | ICD-10-CM | POA: Insufficient documentation

## 2017-04-26 DIAGNOSIS — Z882 Allergy status to sulfonamides status: Secondary | ICD-10-CM | POA: Diagnosis not present

## 2017-04-26 DIAGNOSIS — F17210 Nicotine dependence, cigarettes, uncomplicated: Secondary | ICD-10-CM | POA: Diagnosis not present

## 2017-04-26 DIAGNOSIS — Z862 Personal history of diseases of the blood and blood-forming organs and certain disorders involving the immune mechanism: Secondary | ICD-10-CM

## 2017-04-26 DIAGNOSIS — O24119 Pre-existing diabetes mellitus, type 2, in pregnancy, unspecified trimester: Secondary | ICD-10-CM

## 2017-04-26 DIAGNOSIS — IMO0001 Reserved for inherently not codable concepts without codable children: Secondary | ICD-10-CM

## 2017-04-26 LAB — GLUCOSE, CAPILLARY: GLUCOSE-CAPILLARY: 114 mg/dL — AB (ref 65–99)

## 2017-04-26 NOTE — Progress Notes (Signed)
ROB

## 2017-04-26 NOTE — Progress Notes (Signed)
[facility]  Fetal Echocardiogram at 21 weeks [X]  Baseline P/C ratio ordered [X]  No prenatal labs ordered today -First trimester screen negative -]No log

## 2017-04-26 NOTE — Progress Notes (Signed)
MFM Consultation  26 yo G5P1031 at 16 weeks by US performed in the [facility] ED on 02/14/17 and confirmed at [place] at 11/4 weeks on 03/25/17.  She presents for consultation due to Type 2 DM and BMI 44.    Her history is also significant for grand mal seizure disorder (seizure free off meds x 1 year), cesarean delivery, ADHD/depression/Bipolar disorder/Anxiety, Asthma, Anemia.   Past Medical History:  Diagnosis Date  . ADHD   . Anemia    BLOOD TRANSFUSION AFTER C-SECTION ON 06-2016-2 UNITS  . Anxiety   . Asthma    WELL CONTROLLED  . Bipolar 1 disorder, depressed ([place])   . Depression   . Diabetes mellitus without complication ([place])   . Gallstones   . GERD (gastroesophageal reflux disease)   . Migraines   . Ovarian cyst   . Seizures ([place])    LAST SEIZURE 2016  . Tubal pregnancy   Anemia--Reports need for transfusion during last pregnancy. ?b thal in record Seizure--off meds x 1 year--no seizures.  Diagnosed at age 76 (negaitve head imaging,  per pt)  Past Surgical History:  Procedure Laterality Date  . CESAREAN SECTION N/A 06/22/2016   Procedure: CESAREAN SECTION;  Surgeon: Malachy Mood, MD;  Location: [facility] ORS;  Service: Obstetrics;  Laterality: N/A;  . CHOLECYSTECTOMY N/A 08/13/2016   Procedure: LAPAROSCOPIC CHOLECYSTECTOMY;  Surgeon: Jules Husbands, MD;  Location: [facility] ORS;  Service: General;  Laterality: N/A;  . DILATION AND CURETTAGE OF UTERUS    . ovarian cyst removed    . SALPINGECTOMY Right 2013   ectopic pregnancy  . TONSILLECTOMY    . TONSILLECTOMY AND ADENOIDECTOMY       OB History  Gravida Para Term Preterm AB Living  _0 SAB TAB Ectopic Multiple Live Births  3     0 1    # Outcome Date GA Lbr Len/2nd Weight Sex Delivery Anes PTL Lv  5 Current           4 Term 06/22/16 [redacted]w[redacted]d 8 lb 7.1 oz (3.83 kg) M CS-LTranv Spinal, EPI  LIV  3 SAB           2 SAB           1 SAB              Current Outpatient Prescriptions on File Prior to Encounter   Medication Sig Dispense Refill  . albuterol (PROVENTIL HFA;VENTOLIN HFA) 108 (90 BASE) MCG/ACT inhaler Inhale 2 puffs into the lungs every 6 (six) hours as needed. For shortness of breath    . blood glucose meter kit and supplies KIT Dispense based on patient and insurance preference. Use up to four times daily as directed. (FOR ICD-9 250.00, 250.01). 1 each 0  . Doxylamine-Pyridoxine (DICLEGIS) 10-10 MG TBEC Take 2 tablets by mouth at bedtime. If symptoms persist, add one tablet in the morning and one in the afternoon 1948tablet 4  . folic acid (FOLVITE) 1 MG tablet Take 3 mg by mouth daily.    .Marland KitchenglyBURIDE (DIABETA) 2.5 MG tablet Take 1 tablet (2.5 mg total) by mouth 2 (two) times daily with a meal. 60 tablet 6  . Iron Combinations (CHROMAGEN) capsule Take 1 capsule by mouth daily. 30 capsule 11  . ranitidine (ZANTAC) 150 MG tablet Take 1 tablet (150 mg total) by mouth at bedtime. 30 tablet 1  . ferrous sulfate (FERROUSUL) 325 (65 FE) MG tablet Take 1 tablet (  325 mg total) by mouth 2 (two) times daily. (Patient not taking: Reported on 03/02/2017) 60 tablet 1  . lidocaine (LIDODERM) 5 % Place 1 patch onto the skin daily. (Patient not taking: Reported on 03/02/2017) 15 patch 0   No current facility-administered medications on file prior to encounter.      Allergies  Allergen Reactions  . Ivp Dye [Iodinated Diagnostic Agents] Anaphylaxis  . Morphine Other (See Comments)    bradycradia bradycradia  . Sulfasalazine Hives  . Tomato Anaphylaxis  . Toradol [Ketorolac Tromethamine] Anaphylaxis, Hives and Swelling  . Dilaudid [Hydromorphone Hcl] Hives  . Latex Rash  . Sulfa Antibiotics Hives  . Tramadol Swelling, Rash and Hives  . Hydrocodone-Acetaminophen Other (See Comments)  . Iohexol Other (See Comments)    Pt. States she can not take iohexol but won't state reaction type. Pt. States she can not take iohexol but won't state reaction type. Pt. States she can not take iohexol but won't state  reaction type.  . Lamictal [Lamotrigine] Other (See Comments)  . Mushroom Extract Complex Rash    Social History   Social History  . Marital status: Single    Spouse name: N/A  . Number of children: N/A  . Years of education: N/A   Occupational History  . Not on file.   Social History Main Topics  . Smoking status: Current Every Day Smoker    Packs/day: 0.25    Years: 3.00    Types: Cigarettes  . Smokeless tobacco: Never Used  . Alcohol use No  . Drug use: No  . Sexual activity: Yes    Birth control/ protection: None   Other Topics Concern  . Not on file   Social History Narrative  . No narrative on file   She reports 3 cig/day, no etoh or other drug use.  She is not working presently (disability)  Family History  Problem Relation Age of Onset  . Heart disease Father   . Huntington's disease Mother    Vitals:   04/26/17 1337  BP: (!) 109/54  Pulse: 87  Resp: 18  Temp: 98.2 F (36.8 C)   Body mass index is 44.18 kg/m.   BSUS single live IUP  26 yo G3P1011 at [redacted] weeks gestation with Type 2 diabetes, seizure disorder (no grand mal seizure for 1 year off meds), bipolar disorder/ADHD/Depresion  1. Type 2 DM on glybruide We discussed the effects of her diabetes on the pregnancy and the fetus as well as the pregnancy effects on her diabetes. We reviewed the increased risk of congenital anomalies, fetal growth disturbances, fetal death, and neonatal metabolic complications and the importance of good glucose control to minimize these risks. We discussed increasing diabetes medications (possible need for insulin) and increase in insulin requirements with advancing pregnancy and the need for frequent adjustment of insulin dosages to meet these needs.   -We cautioned her about over control and the need to avoid hypoglycemia. We also addressed the increased risk for conditions such as preeclampsia (new onset hypertension and proteinuria during pregnancy only treated by  delivery) and recommended use of low dose 'baby' aspirin daily to reduce the risk for preeclampsia.  She is unable to take aspirin due to an allergy.   1. Fasting a capillary blood glucose below 95 mg/dl; 2. Two hour postprandial capillary blood glucoses less than 120 mg/dl or one-hour postprandial less than 130 mg/dl   In addition, we have the following recommendations:  1. Baseline EKG--she reports normal baseline ECG  within a year 2. Baseline P: C ratio, CBC, TSH, Hgb A1c,  and CMP(labs were requested for review from office, but not yet available) 3. Ophthalmological examination 4. Detailed fetal anatomic survey ~[redacted] weeks gestation--we have scheduled this exam here at [facility] 5. Fetal echocardiogram ~[redacted]weeks gestation;  6. Ultrasound for fetal growth every 4 weeks beginning at [redacted] weeks gestation;  7. Twice weekly nonstress tests beginning at [redacted] weeks gestation 8. Delivery planning will be determine once the patient is closer to her estimated due date. This will be determined by:    A. Fetal size;    B. Amniotic fluid volume;    C. Level of glucose control;    D. Other medical or obstetrical complications. If poor glycemic control is noted or if fetopathy is present, we would generally  recommend repeat c/s at ~[redacted] weeks gestation.    9. Follow up: We have scheduled her for a visit with the [facility] and for a follow up consultation with [place] here at the time of her anatomic survey (in 2 weeks). We will review her glycemic control on the oral medications and may need to begin insulin (and resume metformin) if poor control is noted at that time.    --we will also arrange for her to have genetic counseling for teratology and anemia (?history of Bthal) --will need: Heb electrophoresis, ferritin, vitamin b 12, folate, smear  2. ADHD/ Bipolar/Depression--we reviewed medications and low risk for teratogenicity, however, I would like her to meet with our genetic counselor next visit  to readdress.  We also addressed risks for transient tachypnea of the newborn associated with prozac.  --she will need close follow up postpartum for risk for postpartum depression  3. Tobacco--we addressed association with low birth weight and abruption--she has decreased from 2 ppd to 5 cig/day!  4. Seizure disorder--Diagnosed at age 65 (reports negative head imaging) She cannot remember previous medication used but has been seizure free off medications for over a year, therefore is at low risk for seizures.    5. BMI 44--recommendations and  Surveillance as per T2 DM.  We also addressed recommendations for 10-15lb weight gain.  She will benefit from [facility] counseling for this condition, as well.   6. Previous c/s--planning for repeat at 39w.   If poor glycemic control is noted or if fetopathy is present, we would generally  recommend repeat c/s at ~[redacted] weeks gestation.   Time: 30 minutes spent in consultation

## 2017-04-27 ENCOUNTER — Telehealth: Payer: Self-pay

## 2017-04-27 ENCOUNTER — Encounter: Payer: Self-pay | Admitting: Obstetrics and Gynecology

## 2017-04-27 LAB — COMPREHENSIVE METABOLIC PANEL
A/G RATIO: 1.2 (ref 1.2–2.2)
ALBUMIN: 3.6 g/dL (ref 3.5–5.5)
ALT: 8 IU/L (ref 0–32)
AST: 9 IU/L (ref 0–40)
Alkaline Phosphatase: 87 IU/L (ref 39–117)
BUN / CREAT RATIO: 8 — AB (ref 9–23)
BUN: 4 mg/dL — ABNORMAL LOW (ref 6–20)
Bilirubin Total: 0.2 mg/dL (ref 0.0–1.2)
CALCIUM: 9.2 mg/dL (ref 8.7–10.2)
CO2: 20 mmol/L (ref 20–29)
Chloride: 101 mmol/L (ref 96–106)
Creatinine, Ser: 0.49 mg/dL — ABNORMAL LOW (ref 0.57–1.00)
GFR, EST AFRICAN AMERICAN: 156 mL/min/{1.73_m2} (ref >59)
GFR, EST NON AFRICAN AMERICAN: 135 mL/min/{1.73_m2} (ref >59)
GLOBULIN, TOTAL: 2.9 g/dL (ref 1.5–4.5)
Glucose: 64 mg/dL — ABNORMAL LOW (ref 65–99)
POTASSIUM: 4.1 mmol/L (ref 3.5–5.2)
SODIUM: 138 mmol/L (ref 134–144)
TOTAL PROTEIN: 6.5 g/dL (ref 6.0–8.5)

## 2017-04-27 LAB — RPR+RH+ABO+RUB AB+AB SCR+CB...
ANTIBODY SCREEN: NEGATIVE
HEMATOCRIT: 40.5 % (ref 34.0–46.6)
HIV SCREEN 4TH GENERATION: NONREACTIVE
Hemoglobin: 13.1 g/dL (ref 11.1–15.9)
Hepatitis B Surface Ag: NEGATIVE
MCH: 24.5 pg — ABNORMAL LOW (ref 26.6–33.0)
MCHC: 32.3 g/dL (ref 31.5–35.7)
MCV: 76 fL — AB (ref 79–97)
Platelets: 313 10*3/uL (ref 150–379)
RBC: 5.35 x10E6/uL — AB (ref 3.77–5.28)
RDW: 18.9 % — ABNORMAL HIGH (ref 12.3–15.4)
RH TYPE: POSITIVE
RPR: NONREACTIVE
RUBELLA: 2.42 {index} (ref >0.99)
Varicella zoster IgG: 373 index (ref >165)
WBC: 12.7 10*3/uL — ABNORMAL HIGH (ref 3.4–10.8)

## 2017-04-27 LAB — PROTEIN / CREATININE RATIO, URINE
Creatinine, Urine: 191 mg/dL
Protein, Ur: 20.6 mg/dL
Protein/Creat Ratio: 108 mg/g creat (ref 0–200)

## 2017-04-27 NOTE — Telephone Encounter (Signed)
Pt is calling about her labs results. Please advise °

## 2017-04-27 NOTE — Telephone Encounter (Signed)
Pt calling for results.  [phone]

## 2017-04-27 NOTE — Telephone Encounter (Signed)
Please advise 

## 2017-04-28 LAB — URINE CULTURE

## 2017-04-29 ENCOUNTER — Other Ambulatory Visit: Payer: Self-pay | Admitting: *Deleted

## 2017-04-29 DIAGNOSIS — O24319 Unspecified pre-existing diabetes mellitus in pregnancy, unspecified trimester: Secondary | ICD-10-CM

## 2017-04-30 NOTE — Telephone Encounter (Signed)
Pt is calling about her labs results. Please advise °

## 2017-05-04 ENCOUNTER — Telehealth: Payer: Self-pay

## 2017-05-04 NOTE — Telephone Encounter (Signed)
Pt calling for test results.  [phone]  Left detailed msg that AMS has sent her an email saying everything looked good.

## 2017-05-06 ENCOUNTER — Other Ambulatory Visit: Payer: Self-pay | Admitting: *Deleted

## 2017-05-06 DIAGNOSIS — O24912 Unspecified diabetes mellitus in pregnancy, second trimester: Secondary | ICD-10-CM

## 2017-05-10 ENCOUNTER — Inpatient Hospital Stay: Admission: RE | Admit: 2017-05-10 | Payer: Self-pay | Source: Ambulatory Visit

## 2017-05-10 ENCOUNTER — Ambulatory Visit
Admission: RE | Admit: 2017-05-10 | Discharge: 2017-05-10 | Disposition: A | Discharge status: Home / Self Care | Payer: Medicaid Other | Source: Ambulatory Visit | Attending: Maternal & Fetal Medicine | Admitting: Maternal & Fetal Medicine

## 2017-05-10 ENCOUNTER — Ambulatory Visit (HOSPITAL_BASED_OUTPATIENT_CLINIC_OR_DEPARTMENT_OTHER)
Admission: RE | Admit: 2017-05-10 | Discharge: 2017-05-10 | Disposition: A | Discharge status: Home / Self Care | Payer: Medicaid Other | Source: Ambulatory Visit | Attending: Maternal & Fetal Medicine | Admitting: Maternal & Fetal Medicine

## 2017-05-10 VITALS — BP 119/53 | HR 95 | Temp 98.2°F | Resp 18 | Ht 60.0 in | Wt 227.8 lb

## 2017-05-10 DIAGNOSIS — E1169 Type 2 diabetes mellitus with other specified complication: Secondary | ICD-10-CM | POA: Diagnosis not present

## 2017-05-10 DIAGNOSIS — O99342 Other mental disorders complicating pregnancy, second trimester: Secondary | ICD-10-CM

## 2017-05-10 DIAGNOSIS — O24119 Pre-existing diabetes mellitus, type 2, in pregnancy, unspecified trimester: Secondary | ICD-10-CM

## 2017-05-10 DIAGNOSIS — O99330 Smoking (tobacco) complicating pregnancy, unspecified trimester: Secondary | ICD-10-CM | POA: Diagnosis not present

## 2017-05-10 DIAGNOSIS — O99212 Obesity complicating pregnancy, second trimester: Secondary | ICD-10-CM | POA: Diagnosis not present

## 2017-05-10 DIAGNOSIS — F909 Attention-deficit hyperactivity disorder, unspecified type: Secondary | ICD-10-CM | POA: Insufficient documentation

## 2017-05-10 DIAGNOSIS — E669 Obesity, unspecified: Secondary | ICD-10-CM | POA: Insufficient documentation

## 2017-05-10 DIAGNOSIS — O24912 Unspecified diabetes mellitus in pregnancy, second trimester: Secondary | ICD-10-CM | POA: Diagnosis not present

## 2017-05-10 DIAGNOSIS — IMO0001 Reserved for inherently not codable concepts without codable children: Secondary | ICD-10-CM

## 2017-05-10 DIAGNOSIS — O99512 Diseases of the respiratory system complicating pregnancy, second trimester: Secondary | ICD-10-CM | POA: Diagnosis not present

## 2017-05-10 DIAGNOSIS — O99012 Anemia complicating pregnancy, second trimester: Secondary | ICD-10-CM | POA: Diagnosis not present

## 2017-05-10 DIAGNOSIS — J45909 Unspecified asthma, uncomplicated: Secondary | ICD-10-CM

## 2017-05-10 DIAGNOSIS — O99332 Smoking (tobacco) complicating pregnancy, second trimester: Secondary | ICD-10-CM | POA: Insufficient documentation

## 2017-05-10 DIAGNOSIS — O99350 Diseases of the nervous system complicating pregnancy, unspecified trimester: Secondary | ICD-10-CM

## 2017-05-10 DIAGNOSIS — G40409 Other generalized epilepsy and epileptic syndromes, not intractable, without status epilepticus: Secondary | ICD-10-CM | POA: Diagnosis not present

## 2017-05-10 DIAGNOSIS — O09299 Supervision of pregnancy with other poor reproductive or obstetric history, unspecified trimester: Secondary | ICD-10-CM | POA: Diagnosis not present

## 2017-05-10 DIAGNOSIS — G40909 Epilepsy, unspecified, not intractable, without status epilepticus: Secondary | ICD-10-CM

## 2017-05-10 DIAGNOSIS — Z6841 Body Mass Index (BMI) 40.0 and over, adult: Secondary | ICD-10-CM

## 2017-05-10 DIAGNOSIS — O99519 Diseases of the respiratory system complicating pregnancy, unspecified trimester: Secondary | ICD-10-CM | POA: Diagnosis not present

## 2017-05-10 DIAGNOSIS — F17210 Nicotine dependence, cigarettes, uncomplicated: Secondary | ICD-10-CM | POA: Insufficient documentation

## 2017-05-10 DIAGNOSIS — F988 Other specified behavioral and emotional disorders with onset usually occurring in childhood and adolescence: Secondary | ICD-10-CM | POA: Diagnosis not present

## 2017-05-10 DIAGNOSIS — O34219 Maternal care for unspecified type scar from previous cesarean delivery: Secondary | ICD-10-CM | POA: Insufficient documentation

## 2017-05-10 DIAGNOSIS — Z3A17 17 weeks gestation of pregnancy: Secondary | ICD-10-CM | POA: Insufficient documentation

## 2017-05-10 DIAGNOSIS — D649 Anemia, unspecified: Secondary | ICD-10-CM | POA: Diagnosis not present

## 2017-05-10 DIAGNOSIS — Z862 Personal history of diseases of the blood and blood-forming organs and certain disorders involving the immune mechanism: Secondary | ICD-10-CM

## 2017-05-10 DIAGNOSIS — O99352 Diseases of the nervous system complicating pregnancy, second trimester: Secondary | ICD-10-CM | POA: Insufficient documentation

## 2017-05-10 DIAGNOSIS — F319 Bipolar disorder, unspecified: Secondary | ICD-10-CM | POA: Insufficient documentation

## 2017-05-10 DIAGNOSIS — F329 Major depressive disorder, single episode, unspecified: Secondary | ICD-10-CM

## 2017-05-10 HISTORY — DX: Epilepsy, unspecified, not intractable, without status epilepticus: G40.909

## 2017-05-10 HISTORY — DX: Smoking (tobacco) complicating pregnancy, unspecified trimester: O99.330

## 2017-05-10 LAB — GLUCOSE, CAPILLARY: GLUCOSE-CAPILLARY: 112 mg/dL — AB (ref 65–99)

## 2017-05-10 NOTE — Addendum Note (Signed)
Encounter addended by: Lady DeutscherJames, Indra Wolters, MD on: 05/10/2017  4:13 PM<BR>    Actions taken: Sign clinical note

## 2017-05-10 NOTE — Progress Notes (Addendum)
[place] Follow-up Consultation  26 yo [phone] at [redacted]w[redacted]d weeks by CLibertyconsistent with 121w6deasured [facility].  (The patient had [facility] [place]/14[facility] Today, she presents for anatomy US[place] follow-up MFM consultation due to Type 2 DM.  She was originally seen by Dr. SmDiamantina Monksn 04/26/2017.  Since her last visit, the patient has not been to [facility] and refused to go. She was not aware of an appointment, but also said she has no transportation.  She has not checked her BS in 5 days, because her meter is giving her error messages.  Prior to that time and per her memory, her BSs are as below.    She is currently on glyburide 2.5 mg bid.  She was switched to glyburide from metformin this pregnancy.  Review of blood sugars:  FBS 80s-90s 2 hr PP breakfast 110s to 140s 2 hr PP lunch 90s 2 hr PP dinner 90s-110s   Her history is also significant for BMI > 40, grand mal seizure disorder (seizure free off meds x 1 year), previous cesarean delivery, transfusion,  ADHD/depression/Bipolar disorder/Anxiety, Asthma, Anemia, and cigarette smoking.   Today she has no complaints.     Current Outpatient Prescriptions:  .  albuterol (PROVENTIL HFA;VENTOLIN HFA) 108 (90 BASE) MCG/ACT inhaler, Inhale 2 puffs into the lungs every 6 (six) hours as needed. For shortness of breath, Disp: , Rfl:  .  amphetamine-dextroamphetamine (ADDERALL) 10 MG tablet, Take 10 mg by mouth daily with breakfast., Disp: , Rfl:  .  blood glucose meter kit and supplies KIT, Dispense based on patient and insurance preference. Use up to four times daily as directed. (FOR ICD-9 250.00, 250.01)., Disp: 1 each, Rfl: 0 .  clonazePAM (KLONOPIN) 1 MG tablet, Take 1 mg by mouth 2 (two) times daily., Disp: , Rfl:  .  Doxylamine-Pyridoxine (DICLEGIS) 10-10 MG TBEC, Take 2 tablets by mouth at bedtime. If symptoms persist, add one  tablet in the morning and one in the afternoon, Disp: 120 tablet, Rfl: 4 .  FLUoxetine (PROZAC) 40 MG capsule, Take 40 mg by mouth daily., Disp: , Rfl:  .  folic acid (FOLVITE) 1 MG tablet, Take 3 mg by mouth daily., Disp: , Rfl:  .  glyBURIDE (DIABETA) 2.5 MG tablet, Take 1 tablet (2.5 mg total) by mouth 2 (two) times daily with a meal., Disp: 60 tablet, Rfl: 6 .  Iron Combinations (CHROMAGEN) capsule, Take 1 capsule by mouth daily., Disp: 30 capsule, Rfl: 11 .  ranitidine (ZANTAC) 150 MG tablet, Take 1 tablet (150 mg total) by mouth at bedtime., Disp: 30 tablet, Rfl: 1 .  ferrous sulfate (FERROUSUL) 325 (65 FE) MG tablet, Take 1 tablet (325 mg total) by mouth 2 (two) times daily. (Patient not taking: Reported on 05/10/2017), Disp: 60 tablet, Rfl: 1 .  lidocaine (LIDODERM) 5 %, Place 1 patch onto the skin daily. (Patient not taking: Reported on 03/02/2017), Disp: 15 patch, Rfl: 0   BP (!) 119/53   Pulse 95   Temp 98.2 F (36.8 C)   Resp 18   Ht 5' (1.524 m)   Wt 227 lb 12.8 oz (103.3 kg)   LMP 12/28/2016 (Approximate)   SpO2 96%   BMI 44.49 kg/m    US[place] - anatomy suboptimally visualized, no previa, and normal cervix length.  RBS today was 112   04/09/2017 - first trimester  screen negative  04/26/2017 - CMP with normal LFTs, creatine 0.49; P/C ratio 108     Component Value Date/Time   WBC 12.7 (H) 04/26/2017 1519   RBC 5.35 (H) 04/26/2017 1519   HGB 13.1 04/26/2017 1519   HCT 40.5 04/26/2017 1519   PLT 313 04/26/2017 1519   MCV 76 (L) 04/26/2017 1519   MCH 24.5 (L) 04/26/2017 1519   MCHC 32.3 04/26/2017 1519   RDW 18.9 (H) 04/26/2017 1519    Impression/Plan:  26 yo gravida 4 para 1031 at 35w3dgestation with:  1. Type 2 DM on glybruide -- See Dr. SThom Chimesnote from 04/26/2017 about our previous recommendations --  She is not taking ASA because she is allergic to ASA (hives - added to EMR) -- The patient was encouraged to replace her meter -- Still needs TSH,  Hgb A1c -  ordered today -- Still needs ophthalmological examination - She was encouraged to find recommendation  -- Scheduled for follow-up fetal anatomic survey here in 2 weeks (8/20/208) -- Fetal echocardiogram in 4-5 weeks - [place] [facility] [place] -- Fetal surveillance as outlined in Dr. SThom Chimesnote of 04/26/2017 -- Delivery planning once the patient is closer to her estimated due date. We will plan to see her around 36 weeks. 2. BMI > 40 -- See above 3. History of seizure disorder - off meds and no seizure > 1 year 4. Previous cesarean delivery - planning for repeat 5. Previous transfusion at delivery  -- Have second line uterotonic in the room 6. ADHD/ Bipolar/Depression - on Prozac, Adderall and Klonopin.  [facility]  -- Introduced to our gDietitian DDonette Larry who can review the fetal implications at her follow-up anatomy appointment  7. Asthma - last used inhaler last month when she had a URI 8. History of anemia - none now, but with low MCV suggested of thalassemia trait -- Hb electrophoresis today.  Our genetic counselor was made aware so she can follow-up on this. 9.Cigarette smoking --she decreased from 2 ppd to 5 cig/day with pregnancy and continues to smoke 5 cig per day.  I spent 30 minutes in consultation, more than half of which was spent counseling the patient and coordinating her care.  AErasmo Score MD [facility]  Addendum: HbA1C - 5.2 TSH - 1.79, FTI 1.7 Hemoglobin electrophoresis - normal  AErasmo Score MD [facility]

## 2017-05-11 LAB — THYROID PANEL WITH TSH
Free Thyroxine Index: 1.7 (ref 1.2–4.9)
T3 Uptake Ratio: 15 % — ABNORMAL LOW (ref 24–39)
T4, Total: 11 ug/dL (ref 4.5–12.0)
TSH: 1.79 u[IU]/mL (ref 0.450–4.500)

## 2017-05-11 LAB — HEMOGLOBINOPATHY EVALUATION
HGB A: 98 % (ref 96.4–98.8)
HGB F QUANT: 0 % (ref 0.0–2.0)
HGB S QUANTITAION: 0 %
HGB VARIANT: 0 %
Hgb A2 Quant: 2 % (ref 1.8–3.2)
Hgb C: 0 %

## 2017-05-11 LAB — HEMOGLOBIN A1C
HEMOGLOBIN A1C: 5.2 % (ref 4.8–5.6)
Mean Plasma Glucose: 103 mg/dL

## 2017-05-20 ENCOUNTER — Other Ambulatory Visit: Payer: Self-pay | Admitting: *Deleted

## 2017-05-20 DIAGNOSIS — O99210 Obesity complicating pregnancy, unspecified trimester: Secondary | ICD-10-CM

## 2017-05-20 DIAGNOSIS — O24419 Gestational diabetes mellitus in pregnancy, unspecified control: Secondary | ICD-10-CM

## 2017-05-24 ENCOUNTER — Inpatient Hospital Stay: Admission: RE | Admit: 2017-05-24 | Payer: Self-pay | Source: Ambulatory Visit

## 2017-05-24 ENCOUNTER — Other Ambulatory Visit: Payer: Self-pay | Admitting: Obstetrics and Gynecology

## 2017-05-24 ENCOUNTER — Ambulatory Visit (HOSPITAL_BASED_OUTPATIENT_CLINIC_OR_DEPARTMENT_OTHER)
Admission: RE | Admit: 2017-05-24 | Discharge: 2017-05-24 | Disposition: A | Discharge status: Home / Self Care | Payer: Medicaid Other | Source: Ambulatory Visit | Attending: Maternal & Fetal Medicine | Admitting: Maternal & Fetal Medicine

## 2017-05-24 ENCOUNTER — Ambulatory Visit
Admission: RE | Admit: 2017-05-24 | Discharge: 2017-05-24 | Disposition: A | Discharge status: Home / Self Care | Payer: Medicaid Other | Source: Ambulatory Visit | Attending: Maternal & Fetal Medicine | Admitting: Maternal & Fetal Medicine

## 2017-05-24 DIAGNOSIS — F419 Anxiety disorder, unspecified: Secondary | ICD-10-CM | POA: Insufficient documentation

## 2017-05-24 DIAGNOSIS — G40909 Epilepsy, unspecified, not intractable, without status epilepticus: Secondary | ICD-10-CM | POA: Diagnosis not present

## 2017-05-24 DIAGNOSIS — O24919 Unspecified diabetes mellitus in pregnancy, unspecified trimester: Secondary | ICD-10-CM | POA: Diagnosis present

## 2017-05-24 DIAGNOSIS — O24419 Gestational diabetes mellitus in pregnancy, unspecified control: Secondary | ICD-10-CM

## 2017-05-24 DIAGNOSIS — Z820 Family history of epilepsy and other diseases of the nervous system: Secondary | ICD-10-CM | POA: Diagnosis not present

## 2017-05-24 DIAGNOSIS — O09299 Supervision of pregnancy with other poor reproductive or obstetric history, unspecified trimester: Secondary | ICD-10-CM

## 2017-05-24 DIAGNOSIS — O99012 Anemia complicating pregnancy, second trimester: Secondary | ICD-10-CM | POA: Diagnosis not present

## 2017-05-24 DIAGNOSIS — Z3A19 19 weeks gestation of pregnancy: Secondary | ICD-10-CM | POA: Insufficient documentation

## 2017-05-24 DIAGNOSIS — F329 Major depressive disorder, single episode, unspecified: Secondary | ICD-10-CM | POA: Insufficient documentation

## 2017-05-24 DIAGNOSIS — Z862 Personal history of diseases of the blood and blood-forming organs and certain disorders involving the immune mechanism: Secondary | ICD-10-CM | POA: Diagnosis not present

## 2017-05-24 DIAGNOSIS — O99352 Diseases of the nervous system complicating pregnancy, second trimester: Secondary | ICD-10-CM | POA: Diagnosis not present

## 2017-05-24 DIAGNOSIS — O99342 Other mental disorders complicating pregnancy, second trimester: Secondary | ICD-10-CM | POA: Diagnosis not present

## 2017-05-24 DIAGNOSIS — O99330 Smoking (tobacco) complicating pregnancy, unspecified trimester: Secondary | ICD-10-CM

## 2017-05-24 DIAGNOSIS — J45909 Unspecified asthma, uncomplicated: Secondary | ICD-10-CM

## 2017-05-24 DIAGNOSIS — O99210 Obesity complicating pregnancy, unspecified trimester: Secondary | ICD-10-CM | POA: Insufficient documentation

## 2017-05-24 DIAGNOSIS — O99519 Diseases of the respiratory system complicating pregnancy, unspecified trimester: Principal | ICD-10-CM

## 2017-05-24 MED ORDER — ACCU-CHEK FASTCLIX LANCETS MISC: 1.0000 [IU] | Freq: Four times a day (QID) | 12 refills | Status: DC

## 2017-05-24 MED ORDER — ACCU-CHEK NANO SMARTVIEW W/DEVICE KIT: 1.0000 | PACK | 0 refills | Status: DC

## 2017-05-24 MED ORDER — GLUCOSE BLOOD VI STRP: ORAL_STRIP | 12 refills | Status: DC

## 2017-05-24 NOTE — Progress Notes (Signed)
Referring physician:  [facility] Length of consultation:  55 minutes  [name] was scheduled for genetic counseling to review the possibility of being a carrier for beta thalassemia as well as medication exposures in this pregnancy.  We obtained a detailed pregnancy history.  This is the third pregnancy for [name] and her fianc.  They had one early miscarriage and a healthy son who is now 26 year old.  In this pregnancy, [name] has been prescribed Klonopin, Adderall and Prozac for her mental health conditions.  She has not been taking these medications, however, as she was concerned about their safety in pregnancy.  She is also prescribed iron and medication for her diabetes (see MFM consultation).  While she is now taking the iron, she did not begin it when her OB initially prescribed it, and she has only been taking 1 tablet instead of the 2 daily as directed.  Lastly, [name] reports smoking 3-5 cigarettes per day, down from 2 packs per day prior to pregnancy.  A review of [name]' labs showed that she has a low MCV.  It has been 65-76 (normal is >79) during the last two years.  The hemoglobin electrophoresis was also normal, with an A2 of 2.0.  We would recommend iron studies to determine if her iron is low (since she has not been taking the iron supplement as directed).  The medical record indicates that she may be a carrier for beta thalassemia, however, individuals with beta thalassemia typically have an elevated A2 on hemoglobin electrophoresis and hers is normal.  If the iron is normal, then her lab findings would suggest that she may be a carrier for Alpha thalassemia, an inherited form of anemia.  We discussed that Alpha thalassemia is a condition that is caused by a change in the way the body makes hemoglobin.  Hemoglobin is the chemical inside the blood that gives blood the red color and carries oxygen throughout the body.  This is the same chemical that is changed in sickle cell  disease, only the change is different for alpha thalassemia.  Hemoglobin is made up of two types of subunits, called alpha and beta.  The change that causes alpha thalassemia is a change in the amount of the alpha subunit that your body makes.  Alpha thalassemia trait is more common among persons of [place], [place], and African ancestry than in other ethnic groups.  [name] is of Caucasian and Native American ancestry and her partner is of African American background.  Approximately 1 in 30 persons of African American ancestry is a carrier for this trait; this chance is much lower in Caucasian persons.  If [name]' iron studies are normal, then we would recommend genetic testing to determine if [name] is a carrier for alpha thalassemia, followed by testing on the father of the baby if she were to be a carrier.  Next we talked about the medications she has been prescribed.  Klonopin, Adderall and Prozac.  None of these medications has been shown to clearly increase the risk for birth defects when taken as directed during pregnancy.  The data on Adderall and other amphetamines are complicated by individuals who abuse these drugs.  When mothers are using this medication recreationally, there is data to suggest an increased concern or growth restriction, behavioral and neurodevelopmental differences, though when taken as directed by a doctor, it is less likely to be concerning.  Prozac has been linked to certain birth defects,  including structural heart defects, in some studies, but additional studies have not shown any consistent associations.  Prozac and similar medications are known to be associated with a mild, transient neonatal syndrome with respiratory, gi and neurological symptoms.  In addition, we cannot determine the interactive effects of multiple medications.  [name] stated that she has an appointment with her psychiatrist tomorrow and plans to review her medication options and choices with  him then.    We also obtained a detailed family history. [name] has limited information about her biological family, as she was adopted at age 12 years.  She believes that she has more than 20 siblings (some half, some full) and is in contact with around 7 of them.  Several are said to have mental health diagnoses and were slow learners in school.  She recalls having speech therapy and special education in school, but also stated that their biological parents did not teach the children to speak or learn and that she suffered from severe malnutrition which may have affected her cognitive development. We reviewed that learning delays can happen for many reasons and encouraged her to let her pediatrician know of this history, but that she should keep doing what she can at this time to work on language development and learning with her children.  Mental health conditions including bipolar, depression and schizophrenia may have both genetic and environmental factors which we cannot test for.  Again, the difficulties of her early childhood may have impacted these conditions as well. [name] stated that her sister's daughter was found to be a carrier for cystic fibrosis and that her biological mother passed away at 26 years old with AIDS, hepatitis and Huntington disease.  AIDS and hepatitis are both infectious conditions and would not be inherited.  The father of the baby, Faith [name], also has learning delays and a personal as well as family history of mental health conditions.   We reviewed that Cystic fibrosis (CF) is a genetic condition characterized by thickened mucous secretions throughout the body.  This leads to increased respiratory infections, digestive problems and reproductive problems. CF is not a disorder that can be acquired.  It is caused by an inherited change in the genetic material, or gene, for CF.  We all have two copies of all of our genes, with one copy being inherited from each parent.  CF is  called a recessive condition because both copies of the CF gene must be changed for a person to show features of the disease.  If a person has one changed copy and one normal copy, they are said to be a carrier of CF and have a 50% chance of passing on that changed gene to their children.  People who are carriers are not expected to have any medical problems as a result of the gene change.  For a child to have CF, both of that child's parents must be carriers.  When two parents are carriers, there is a 1 in 4 chance to have a child with CF, a 2 in 4 chance to have a child who is a carrier, and a 1 in 4 chance to have a child with no copies of the changed gene for CF.    If [name] niece is a carrier, then her chance to be a carrier is 1 in 4.  She stated that she had carrier screening for CF in her last pregnancy, but [facility] does not have documentation that this was performed.  If it was not, then carrier screening can be performed with a panel of mutations that can detect approximately 90% of carriers or with medical records, we can ensure testing includes the mutation in her niece.  We are happy to draw this at her next visit if desired.  Lastly, we discussed the dominant inheritance and features of HD.  Dominant means that there is a change in one of the two copies of the gene for this condition which results in features of the disease.  With HD, if a parent has the gene change and is affected with this condition, then each of their children has a 50% chance for inheriting that gene and developing the condition as well.  Onset of symptoms for HD is typically from 79-44 years old, with survival an estimated 15-18 years after symptoms develop.  Clinical features include neurological impairment, psychological changes, involuntary eye and body movements, coordination problems and difficulty in planning.  We talked briefly about this condition, testing and the possibility of speaking with a genetics  clinic who specializes in HD if desired at any time.  The report of HD was based on report from the funeral home, so medical records would be helpful in confirming the diagnosis prior to additional counseling or testing.    [name] is scheduled to return to this clinic in two weeks for follow up MFM consultation.  If desired at that time, labs can be considered for iron studies, alpha thalassemia gene studies and CF carrier screening.  We may be reached at [phone].  Cherly Anderson, MS, CGC

## 2017-05-27 ENCOUNTER — Encounter: Payer: Medicaid Other | Admitting: Maternal Newborn

## 2017-05-31 ENCOUNTER — Ambulatory Visit (INDEPENDENT_AMBULATORY_CARE_PROVIDER_SITE_OTHER): Payer: Medicaid Other | Admitting: Obstetrics and Gynecology

## 2017-05-31 VITALS — BP 118/72 | Wt 228.0 lb

## 2017-05-31 DIAGNOSIS — R102 Pelvic and perineal pain: Secondary | ICD-10-CM

## 2017-05-31 DIAGNOSIS — O0990 Supervision of high risk pregnancy, unspecified, unspecified trimester: Secondary | ICD-10-CM

## 2017-05-31 DIAGNOSIS — Z3A20 20 weeks gestation of pregnancy: Secondary | ICD-10-CM

## 2017-05-31 LAB — POCT URINALYSIS DIPSTICK
Bilirubin, UA: NEGATIVE
GLUCOSE UA: NEGATIVE
Leukocytes, UA: NEGATIVE
NITRITE UA: NEGATIVE
PH UA: 6 (ref 5.0–8.0)
RBC UA: NEGATIVE
Spec Grav, UA: 1.025 (ref 1.010–1.025)
UROBILINOGEN UA: 0.2 U/dL

## 2017-05-31 NOTE — Progress Notes (Signed)
Pt c/o feeling pressure in pelvic area.

## 2017-05-31 NOTE — Progress Notes (Signed)
Pos PNVs. No VB, LOF. Some pressure in vagina, back pain when lying on side--neg dip/check C&S. Increase fluids. Had anat u/s with MFM 05/24/17. Has labs for iron/thalassemia 06/10/17 with MFM. Blood sugars ok per pt but getting new glucometer since hers broke.  Results for orders placed or performed in visit on 05/31/17 (from the past 24 hour(s))  POCT Urinalysis Dipstick     Status: Abnormal   Collection Time: 05/31/17  2:51 PM  Result Value Ref Range   Color, UA dark yellow    Clarity, UA cloudy    Glucose, UA negative    Bilirubin, UA negative    Ketones, UA trace    Spec Grav, UA 1.025 1.010 - 1.025   Blood, UA negative    pH, UA 6.0 5.0 - 8.0   Protein, UA 1+    Urobilinogen, UA 0.2 0.2 or 1.0 E.U./dL   Nitrite, UA negative    Leukocytes, UA Negative Negative

## 2017-06-02 ENCOUNTER — Observation Stay
Admission: EM | Admit: 2017-06-02 | Discharge: 2017-06-03 | Disposition: A | Discharge status: Home / Self Care | Payer: Medicaid Other | Attending: Obstetrics and Gynecology | Admitting: Obstetrics and Gynecology

## 2017-06-02 DIAGNOSIS — F17210 Nicotine dependence, cigarettes, uncomplicated: Secondary | ICD-10-CM | POA: Diagnosis not present

## 2017-06-02 DIAGNOSIS — M62830 Muscle spasm of back: Secondary | ICD-10-CM | POA: Insufficient documentation

## 2017-06-02 DIAGNOSIS — O99612 Diseases of the digestive system complicating pregnancy, second trimester: Secondary | ICD-10-CM | POA: Insufficient documentation

## 2017-06-02 DIAGNOSIS — O99342 Other mental disorders complicating pregnancy, second trimester: Secondary | ICD-10-CM | POA: Insufficient documentation

## 2017-06-02 DIAGNOSIS — Z9049 Acquired absence of other specified parts of digestive tract: Secondary | ICD-10-CM | POA: Diagnosis not present

## 2017-06-02 DIAGNOSIS — O26899 Other specified pregnancy related conditions, unspecified trimester: Secondary | ICD-10-CM | POA: Diagnosis present

## 2017-06-02 DIAGNOSIS — Z888 Allergy status to other drugs, medicaments and biological substances status: Secondary | ICD-10-CM | POA: Insufficient documentation

## 2017-06-02 DIAGNOSIS — O3482 Maternal care for other abnormalities of pelvic organs, second trimester: Secondary | ICD-10-CM | POA: Insufficient documentation

## 2017-06-02 DIAGNOSIS — F909 Attention-deficit hyperactivity disorder, unspecified type: Secondary | ICD-10-CM | POA: Insufficient documentation

## 2017-06-02 DIAGNOSIS — O09299 Supervision of pregnancy with other poor reproductive or obstetric history, unspecified trimester: Secondary | ICD-10-CM

## 2017-06-02 DIAGNOSIS — Z79899 Other long term (current) drug therapy: Secondary | ICD-10-CM | POA: Diagnosis not present

## 2017-06-02 DIAGNOSIS — Z3A21 21 weeks gestation of pregnancy: Secondary | ICD-10-CM | POA: Insufficient documentation

## 2017-06-02 DIAGNOSIS — O24912 Unspecified diabetes mellitus in pregnancy, second trimester: Secondary | ICD-10-CM | POA: Diagnosis not present

## 2017-06-02 DIAGNOSIS — Z91018 Allergy to other foods: Secondary | ICD-10-CM | POA: Insufficient documentation

## 2017-06-02 DIAGNOSIS — J45909 Unspecified asthma, uncomplicated: Secondary | ICD-10-CM

## 2017-06-02 DIAGNOSIS — F319 Bipolar disorder, unspecified: Secondary | ICD-10-CM | POA: Insufficient documentation

## 2017-06-02 DIAGNOSIS — R197 Diarrhea, unspecified: Secondary | ICD-10-CM | POA: Diagnosis not present

## 2017-06-02 DIAGNOSIS — R1012 Left upper quadrant pain: Secondary | ICD-10-CM | POA: Diagnosis not present

## 2017-06-02 DIAGNOSIS — O99112 Other diseases of the blood and blood-forming organs and certain disorders involving the immune mechanism complicating pregnancy, second trimester: Secondary | ICD-10-CM | POA: Diagnosis not present

## 2017-06-02 DIAGNOSIS — Z91041 Radiographic dye allergy status: Secondary | ICD-10-CM | POA: Insufficient documentation

## 2017-06-02 DIAGNOSIS — G43909 Migraine, unspecified, not intractable, without status migrainosus: Secondary | ICD-10-CM | POA: Insufficient documentation

## 2017-06-02 DIAGNOSIS — O99519 Diseases of the respiratory system complicating pregnancy, unspecified trimester: Secondary | ICD-10-CM

## 2017-06-02 DIAGNOSIS — O99512 Diseases of the respiratory system complicating pregnancy, second trimester: Secondary | ICD-10-CM | POA: Diagnosis not present

## 2017-06-02 DIAGNOSIS — O9989 Other specified diseases and conditions complicating pregnancy, childbirth and the puerperium: Secondary | ICD-10-CM | POA: Diagnosis not present

## 2017-06-02 DIAGNOSIS — Z862 Personal history of diseases of the blood and blood-forming organs and certain disorders involving the immune mechanism: Secondary | ICD-10-CM

## 2017-06-02 DIAGNOSIS — Z91040 Latex allergy status: Secondary | ICD-10-CM | POA: Insufficient documentation

## 2017-06-02 DIAGNOSIS — O212 Late vomiting of pregnancy: Secondary | ICD-10-CM | POA: Diagnosis not present

## 2017-06-02 DIAGNOSIS — O26892 Other specified pregnancy related conditions, second trimester: Principal | ICD-10-CM | POA: Insufficient documentation

## 2017-06-02 DIAGNOSIS — Z885 Allergy status to narcotic agent status: Secondary | ICD-10-CM | POA: Insufficient documentation

## 2017-06-02 DIAGNOSIS — N83209 Unspecified ovarian cyst, unspecified side: Secondary | ICD-10-CM | POA: Insufficient documentation

## 2017-06-02 DIAGNOSIS — E119 Type 2 diabetes mellitus without complications: Secondary | ICD-10-CM | POA: Diagnosis not present

## 2017-06-02 DIAGNOSIS — F419 Anxiety disorder, unspecified: Secondary | ICD-10-CM | POA: Insufficient documentation

## 2017-06-02 DIAGNOSIS — D72829 Elevated white blood cell count, unspecified: Secondary | ICD-10-CM | POA: Insufficient documentation

## 2017-06-02 DIAGNOSIS — Z886 Allergy status to analgesic agent status: Secondary | ICD-10-CM | POA: Insufficient documentation

## 2017-06-02 DIAGNOSIS — K219 Gastro-esophageal reflux disease without esophagitis: Secondary | ICD-10-CM | POA: Insufficient documentation

## 2017-06-02 DIAGNOSIS — Z882 Allergy status to sulfonamides status: Secondary | ICD-10-CM | POA: Insufficient documentation

## 2017-06-02 DIAGNOSIS — R109 Unspecified abdominal pain: Secondary | ICD-10-CM

## 2017-06-02 DIAGNOSIS — O99332 Smoking (tobacco) complicating pregnancy, second trimester: Secondary | ICD-10-CM | POA: Insufficient documentation

## 2017-06-02 DIAGNOSIS — O99330 Smoking (tobacco) complicating pregnancy, unspecified trimester: Secondary | ICD-10-CM

## 2017-06-02 DIAGNOSIS — O99352 Diseases of the nervous system complicating pregnancy, second trimester: Secondary | ICD-10-CM

## 2017-06-02 DIAGNOSIS — G40909 Epilepsy, unspecified, not intractable, without status epilepticus: Secondary | ICD-10-CM

## 2017-06-02 LAB — URINE CULTURE

## 2017-06-02 NOTE — OB Triage Note (Signed)
Patient arrived in triage with c/o upper left abdominal pain starting today, getting worse this evening. States pain is a "burning" type pain and radiates to upper left back. Reports "throwing up" yesterday, but no nausea/vomiting today. Reports good fetal movement. Denies leaking of fluid, vaginal bleeding or contractions. Discussed plan of care. Patient verbalized understanding.

## 2017-06-03 DIAGNOSIS — R1012 Left upper quadrant pain: Secondary | ICD-10-CM | POA: Diagnosis not present

## 2017-06-03 DIAGNOSIS — O26892 Other specified pregnancy related conditions, second trimester: Secondary | ICD-10-CM

## 2017-06-03 DIAGNOSIS — Z3A21 21 weeks gestation of pregnancy: Secondary | ICD-10-CM | POA: Diagnosis not present

## 2017-06-03 LAB — CBC
HCT: 38.4 % (ref 35.0–47.0)
HEMOGLOBIN: 12.8 g/dL (ref 12.0–16.0)
MCH: 25.8 pg — AB (ref 26.0–34.0)
MCHC: 33.2 g/dL (ref 32.0–36.0)
MCV: 77.6 fL — ABNORMAL LOW (ref 80.0–100.0)
Platelets: 290 10*3/uL (ref 150–440)
RBC: 4.95 MIL/uL (ref 3.80–5.20)
RDW: 18.8 % — ABNORMAL HIGH (ref 11.5–14.5)
WBC: 13.9 10*3/uL — AB (ref 3.6–11.0)

## 2017-06-03 LAB — GLUCOSE, CAPILLARY: GLUCOSE-CAPILLARY: 78 mg/dL (ref 65–99)

## 2017-06-03 LAB — LIPASE, BLOOD: Lipase: 19 U/L (ref 11–51)

## 2017-06-03 MED ORDER — ALUM & MAG HYDROXIDE-SIMETH 200-200-20 MG/5ML PO SUSP
30.0000 mL | ORAL | Status: AC
  Administered 2017-06-03: 30 mL via ORAL
  Filled 2017-06-03: qty 30

## 2017-06-03 MED ORDER — FAMOTIDINE 20 MG PO TABS
40.0000 mg | ORAL_TABLET | ORAL | Status: AC
  Administered 2017-06-03: 40 mg via ORAL
  Filled 2017-06-03: qty 2

## 2017-06-03 MED ORDER — RANITIDINE HCL 150 MG PO TABS: 150.0000 mg | ORAL_TABLET | Freq: Every day | ORAL | 10 refills | Status: DC

## 2017-06-03 MED ORDER — FAMOTIDINE 20 MG PO TABS
ORAL_TABLET | ORAL | Status: AC
  Filled 2017-06-03: qty 1

## 2017-06-03 NOTE — Addendum Note (Signed)
Encounter addended by: Lady DeutscherJames, Camil Wilhelmsen, MD on: 06/03/2017  9:12 AM<BR>    Actions taken: Sign clinical note

## 2017-06-03 NOTE — Discharge Summary (Signed)
See final progress note. 

## 2017-06-03 NOTE — OB Triage Note (Signed)
Patient given discharge instructions, verbalized understanding. Prescriptions reviewed. All questions answered at this time.

## 2017-06-03 NOTE — Discharge Summary (Signed)
Physician Final Progress Note  Patient ID: Faith [name] MRN: [id number] DOB/AGE: [birth year] 26 y.o.  Admit date: 06/02/2017 Admitting provider: Will Bonnet, MD Discharge date: 06/03/2017   Admission Diagnoses:  1) intrauterine pregnancy at [redacted]w[redacted]d 2) left upper quadrant abdominal pain in pregnancy  Discharge Diagnoses:  1) intrauterine pregnancy at 273w1d2) left upper quadrant abdominal pain in pregnancy  History of Present Illness: The patient is a 26.0 female [id number] 2152w1do presents for acute-onset left upper quadrant abdominal pain. The pain started at about 4 o'clock yesterday afternoon.  The pain has occurred a few times, lasting for about 5 minutes. The pain is described as burning. The pain radiates through to her back.  Nothing makes the pain better or worse. She denies any associated symptoms. She has a history of GERD and ran out of ranitidine about a month ago. She tried Tums yesterday, but was nauseated yesterday with emesis. She has had no nausea today and no emesis. She has tolerated a normal diet today.  She denies fevers, chills, chest pain, trouble breathing.  She denies urinary symptoms.  She has had some diarrhea since yesterday.  Her pain is not modified by eating either way.  She notes +FM, no LOF, no vaginal bleeding, no contractions.  She also has muscle spasms in her back, which is how she describes the pain in her back.     Hospital Course: admitted for observation to L&D.  Lipase and CBC obtained. Lipase normal. CBC notable for mildly elevated WBC count.  Trial of maalox and pepcid.  Fetal heart tones normal.  Vitals normal.  Patient has resolution of symptoms with pepcid and maalox, with mild residual left sided pain.  Final assessment is that she has uncontrolled GERD symptoms and possibly gastritis with some MSK pain from emesis yesterday.  Refill for zantac given and patient discharged in stable condition with routine follow up recommended.   Past  Medical History:  Diagnosis Date  . ADHD   . Anemia    BLOOD TRANSFUSION AFTER C-SECTION ON 06-2016-2 UNITS  . Anxiety   . Asthma    WELL CONTROLLED  . Bipolar 1 disorder, depressed ([facility] . Depression   . Diabetes mellitus without complication ([facility] . Gallstones   . GERD (gastroesophageal reflux disease)   . Migraines   . Ovarian cyst   . Seizures ([facility]  LAST SEIZURE 2016  . Tubal pregnancy     Past Surgical History:  Procedure Laterality Date  . CESAREAN SECTION N/A 06/22/2016   Procedure: CESAREAN SECTION;  Surgeon: AndMalachy MoodD;  Location: [facility] ORS;  Service: Obstetrics;  Laterality: N/A;  . CHOLECYSTECTOMY N/A 08/13/2016   Procedure: LAPAROSCOPIC CHOLECYSTECTOMY;  Surgeon: DieJules HusbandsD;  Location: [facility] ORS;  Service: General;  Laterality: N/A;  . DILATION AND CURETTAGE OF UTERUS    . ovarian cyst removed    . SALPINGECTOMY Right 2013   ectopic pregnancy  . TONSILLECTOMY    . TONSILLECTOMY AND ADENOIDECTOMY      No current facility-administered medications on file prior to encounter.    Current Outpatient Prescriptions on File Prior to Encounter  Medication Sig Dispense Refill  . amphetamine-dextroamphetamine (ADDERALL) 10 MG tablet Take 10 mg by mouth daily with breakfast.    . clonazePAM (KLONOPIN) 1 MG tablet Take 1 mg by mouth 2 (two) times daily.    . ferrous gluconate (FERGON) 324 MG tablet Take 324 mg by mouth.    .Marland Kitchen  FLUoxetine (PROZAC) 40 MG capsule Take 40 mg by mouth daily.    Marland Kitchen glyBURIDE (DIABETA) 2.5 MG tablet Take 1 tablet (2.5 mg total) by mouth 2 (two) times daily with a meal. 60 tablet 6  . ACCU-CHEK FASTCLIX LANCETS MISC 1 Units by Percutaneous route 4 (four) times daily. 100 each 12  . albuterol (PROVENTIL HFA;VENTOLIN HFA) 108 (90 BASE) MCG/ACT inhaler Inhale 2 puffs into the lungs every 6 (six) hours as needed. For shortness of breath    . blood glucose meter kit and supplies KIT Dispense based on patient and insurance preference. Use  up to four times daily as directed. (FOR ICD-9 250.00, 250.01). 1 each 0  . Blood Glucose Monitoring Suppl (ACCU-CHEK NANO SMARTVIEW) w/Device KIT 1 kit by Subdermal route as directed. Check blood sugars for fasting, and two hours after breakfast, lunch and dinner (4 checks daily) 1 kit 0  . Doxylamine-Pyridoxine (DICLEGIS) 10-10 MG TBEC Take 2 tablets by mouth at bedtime. If symptoms persist, add one tablet in the morning and one in the afternoon (Patient not taking: Reported on 05/31/2017) 027 tablet 4  . folic acid (FOLVITE) 1 MG tablet Take 3 mg by mouth daily.    Marland Kitchen glucose blood (ACCU-CHEK SMARTVIEW) test strip Use as instructed to check blood sugars 100 each 12  . Iron Combinations (CHROMAGEN) capsule Take 1 capsule by mouth daily. (Patient not taking: Reported on 06/02/2017) 30 capsule 11    Allergies  Allergen Reactions  . Ivp Dye [Iodinated Diagnostic Agents] Anaphylaxis  . Morphine Other (See Comments)    bradycradia bradycradia  . Sulfasalazine Hives  . Tomato Anaphylaxis  . Toradol [Ketorolac Tromethamine] Anaphylaxis, Hives and Swelling  . Dilaudid [Hydromorphone Hcl] Hives  . Latex Rash  . Sulfa Antibiotics Hives  . Tramadol Swelling, Rash and Hives  . Aspirin Hives  . Hydrocodone-Acetaminophen Other (See Comments)  . Iohexol Other (See Comments)    Pt. States she can not take iohexol but won't state reaction type. Pt. States she can not take iohexol but won't state reaction type. Pt. States she can not take iohexol but won't state reaction type.  . Lamictal [Lamotrigine] Other (See Comments)  . Mushroom Extract Complex Rash    Social History   Social History  . Marital status: Single    Spouse name: N/A  . Number of children: N/A  . Years of education: N/A   Occupational History  . Not on file.   Social History Main Topics  . Smoking status: Current Every Day Smoker    Packs/day: 0.25    Years: 3.00    Types: Cigarettes  . Smokeless tobacco: Never Used  .  Alcohol use No  . Drug use: No  . Sexual activity: Yes    Birth control/ protection: None   Other Topics Concern  . Not on file   Social History Narrative  . No narrative on file    Physical Exam: BP (!) 119/58 (BP Location: Right Arm)   Pulse 94   Temp 98.7 F (37.1 C) (Oral)   Resp 18   Ht 5' (1.524 m)   Wt 228 lb (103.4 kg)   LMP 12/28/2016 (Approximate)   BMI 44.53 kg/m   Gen: NAD CV: RRR Pulm: CTAB Abd: obese, gravid, mild ttp in epigastric area, uterus nontender to palpation, +BS, no distension. Pelvic: deferred Ext: no e/c/t  Consults: None  Significant Findings/ Diagnostic Studies:  Lipase and CBC  Procedures: fetal heart tones normal  Discharge Condition: improved  Disposition: 01-Home or Self Care  Diet: Diabetic diet  Discharge Activity: Activity as tolerated   Allergies as of 06/03/2017      Reactions   Ivp Dye [iodinated Diagnostic Agents] Anaphylaxis   Morphine Other (See Comments)   bradycradia bradycradia   Sulfasalazine Hives   Tomato Anaphylaxis   Toradol [ketorolac Tromethamine] Anaphylaxis, Hives, Swelling   Dilaudid [hydromorphone Hcl] Hives   Latex Rash   Sulfa Antibiotics Hives   Tramadol Swelling, Rash, Hives   Aspirin Hives   Hydrocodone-acetaminophen Other (See Comments)   Iohexol Other (See Comments)   Pt. States she can not take iohexol but won't state reaction type. Pt. States she can not take iohexol but won't state reaction type. Pt. States she can not take iohexol but won't state reaction type.   Lamictal [lamotrigine] Other (See Comments)   Mushroom Extract Complex Rash      Medication List    STOP taking these medications   Doxylamine-Pyridoxine 10-10 MG Tbec Commonly known as:  DICLEGIS     TAKE these medications   ACCU-CHEK FASTCLIX LANCETS Misc 1 Units by Percutaneous route 4 (four) times daily.   ACCU-CHEK NANO SMARTVIEW w/Device Kit 1 kit by Subdermal route as directed. Check blood sugars for  fasting, and two hours after breakfast, lunch and dinner (4 checks daily)   albuterol 108 (90 Base) MCG/ACT inhaler Commonly known as:  PROVENTIL HFA;VENTOLIN HFA Inhale 2 puffs into the lungs every 6 (six) hours as needed. For shortness of breath   amphetamine-dextroamphetamine 10 MG tablet Commonly known as:  ADDERALL Take 10 mg by mouth daily with breakfast.   blood glucose meter kit and supplies Kit Dispense based on patient and insurance preference. Use up to four times daily as directed. (FOR ICD-9 250.00, 250.01).   chromagen capsule Take 1 capsule by mouth daily.   clonazePAM 1 MG tablet Commonly known as:  KLONOPIN Take 1 mg by mouth 2 (two) times daily.   ferrous gluconate 324 MG tablet Commonly known as:  FERGON Take 324 mg by mouth.   FLUoxetine 40 MG capsule Commonly known as:  PROZAC Take 40 mg by mouth daily.   folic acid 1 MG tablet Commonly known as:  FOLVITE Take 3 mg by mouth daily.   glucose blood test strip Commonly known as:  ACCU-CHEK SMARTVIEW Use as instructed to check blood sugars   glyBURIDE 2.5 MG tablet Commonly known as:  DIABETA Take 1 tablet (2.5 mg total) by mouth 2 (two) times daily with a meal.   ranitidine 150 MG tablet Commonly known as:  ZANTAC Take 1 tablet (150 mg total) by mouth at bedtime.            Discharge Care Instructions        Start     Ordered   06/03/17 0000  ranitidine (ZANTAC) 150 MG tablet  Daily at bedtime     06/03/17 0111       Total time spent taking care of this patient: 30 minutes  Signed: Prentice Docker, MD  06/03/2017, 1:15 AM

## 2017-06-10 ENCOUNTER — Ambulatory Visit
Admission: RE | Admit: 2017-06-10 | Discharge: 2017-06-10 | Disposition: A | Discharge status: Home / Self Care | Payer: Medicaid Other | Source: Ambulatory Visit | Attending: Maternal & Fetal Medicine | Admitting: Maternal & Fetal Medicine

## 2017-06-10 DIAGNOSIS — G40909 Epilepsy, unspecified, not intractable, without status epilepticus: Secondary | ICD-10-CM

## 2017-06-10 DIAGNOSIS — J45909 Unspecified asthma, uncomplicated: Secondary | ICD-10-CM

## 2017-06-10 DIAGNOSIS — O99330 Smoking (tobacco) complicating pregnancy, unspecified trimester: Secondary | ICD-10-CM

## 2017-06-10 DIAGNOSIS — O99352 Diseases of the nervous system complicating pregnancy, second trimester: Secondary | ICD-10-CM

## 2017-06-10 DIAGNOSIS — O99519 Diseases of the respiratory system complicating pregnancy, unspecified trimester: Secondary | ICD-10-CM

## 2017-06-10 DIAGNOSIS — O24119 Pre-existing diabetes mellitus, type 2, in pregnancy, unspecified trimester: Secondary | ICD-10-CM | POA: Insufficient documentation

## 2017-06-10 DIAGNOSIS — Z862 Personal history of diseases of the blood and blood-forming organs and certain disorders involving the immune mechanism: Secondary | ICD-10-CM

## 2017-06-10 DIAGNOSIS — O09299 Supervision of pregnancy with other poor reproductive or obstetric history, unspecified trimester: Secondary | ICD-10-CM

## 2017-06-10 LAB — GLUCOSE, CAPILLARY: Glucose-Capillary: 113 mg/dL — ABNORMAL HIGH (ref 65–99)

## 2017-06-17 ENCOUNTER — Inpatient Hospital Stay: Admission: RE | Admit: 2017-06-17 | Payer: Self-pay | Source: Ambulatory Visit

## 2017-06-21 ENCOUNTER — Other Ambulatory Visit: Payer: Self-pay

## 2017-06-24 ENCOUNTER — Ambulatory Visit
Admission: RE | Admit: 2017-06-24 | Discharge: 2017-06-24 | Disposition: A | Discharge status: Home / Self Care | Payer: Medicaid Other | Source: Ambulatory Visit | Attending: Maternal & Fetal Medicine | Admitting: Maternal & Fetal Medicine

## 2017-06-24 VITALS — BP 125/61 | HR 93 | Temp 97.9°F | Resp 18 | Wt 228.6 lb

## 2017-06-24 DIAGNOSIS — Z8669 Personal history of other diseases of the nervous system and sense organs: Secondary | ICD-10-CM | POA: Insufficient documentation

## 2017-06-24 DIAGNOSIS — Z3A24 24 weeks gestation of pregnancy: Secondary | ICD-10-CM | POA: Diagnosis present

## 2017-06-24 DIAGNOSIS — O24912 Unspecified diabetes mellitus in pregnancy, second trimester: Secondary | ICD-10-CM | POA: Diagnosis present

## 2017-06-24 DIAGNOSIS — E1169 Type 2 diabetes mellitus with other specified complication: Secondary | ICD-10-CM

## 2017-06-24 DIAGNOSIS — O99519 Diseases of the respiratory system complicating pregnancy, unspecified trimester: Secondary | ICD-10-CM

## 2017-06-24 DIAGNOSIS — O99342 Other mental disorders complicating pregnancy, second trimester: Secondary | ICD-10-CM | POA: Diagnosis not present

## 2017-06-24 DIAGNOSIS — Z862 Personal history of diseases of the blood and blood-forming organs and certain disorders involving the immune mechanism: Secondary | ICD-10-CM

## 2017-06-24 DIAGNOSIS — O99330 Smoking (tobacco) complicating pregnancy, unspecified trimester: Secondary | ICD-10-CM | POA: Diagnosis not present

## 2017-06-24 DIAGNOSIS — O09299 Supervision of pregnancy with other poor reproductive or obstetric history, unspecified trimester: Secondary | ICD-10-CM

## 2017-06-24 DIAGNOSIS — O99352 Diseases of the nervous system complicating pregnancy, second trimester: Secondary | ICD-10-CM | POA: Diagnosis not present

## 2017-06-24 DIAGNOSIS — F329 Major depressive disorder, single episode, unspecified: Secondary | ICD-10-CM | POA: Diagnosis not present

## 2017-06-24 DIAGNOSIS — F909 Attention-deficit hyperactivity disorder, unspecified type: Secondary | ICD-10-CM | POA: Insufficient documentation

## 2017-06-24 DIAGNOSIS — O99332 Smoking (tobacco) complicating pregnancy, second trimester: Secondary | ICD-10-CM | POA: Insufficient documentation

## 2017-06-24 DIAGNOSIS — F17210 Nicotine dependence, cigarettes, uncomplicated: Secondary | ICD-10-CM | POA: Insufficient documentation

## 2017-06-24 DIAGNOSIS — G40909 Epilepsy, unspecified, not intractable, without status epilepticus: Secondary | ICD-10-CM

## 2017-06-24 DIAGNOSIS — E669 Obesity, unspecified: Secondary | ICD-10-CM

## 2017-06-24 DIAGNOSIS — J45909 Unspecified asthma, uncomplicated: Secondary | ICD-10-CM | POA: Diagnosis not present

## 2017-06-24 DIAGNOSIS — O99512 Diseases of the respiratory system complicating pregnancy, second trimester: Secondary | ICD-10-CM | POA: Diagnosis not present

## 2017-06-24 DIAGNOSIS — O24119 Pre-existing diabetes mellitus, type 2, in pregnancy, unspecified trimester: Secondary | ICD-10-CM

## 2017-06-24 LAB — GLUCOSE, CAPILLARY: Glucose-Capillary: 80 mg/dL (ref 65–99)

## 2017-06-24 NOTE — Progress Notes (Signed)
[facility] Follow-up Consultation  26 yo [phone] at [redacted]w[redacted]d weeks [place] 122w6[place] [facility] [place].  (The patient had an earlier [place]/14/[facility] Today, she returns for consultation due to Type 2 DM.  She is currently on glyburide 2.5 mg bid.  She was switched to glyburide from metformin this pregnancy.  Review of blood sugars:  FBS 82-119 with 3/10 > 95 2 hr PP breakfast 98-159 with 4/10 > 120 2 hr PP lunch 83-157 with 2/10 > 120 2 hr PP dinner 78-161 with 2/10 > 120  Today she has no complaints.     Current Outpatient Prescriptions:  Current Outpatient Prescriptions on File Prior to Encounter  Medication Sig Dispense Refill  . ACCU-CHEK FASTCLIX LANCETS MISC 1 Units by Percutaneous route 4 (four) times daily. 100 each 12  . albuterol (PROVENTIL HFA;VENTOLIN HFA) 108 (90 BASE) MCG/ACT inhaler Inhale 2 puffs into the lungs every 6 (six) hours as needed. For shortness of breath    . amphetamine-dextroamphetamine (ADDERALL) 10 MG tablet Take 10 mg by mouth daily with breakfast.    . blood glucose meter kit and supplies KIT Dispense based on patient and insurance preference. Use up to four times daily as directed. (FOR ICD-9 250.00, 250.01). 1 each 0  . Blood Glucose Monitoring Suppl (ACCU-CHEK NANO SMARTVIEW) w/Device KIT 1 kit by Subdermal route as directed. Check blood sugars for fasting, and two hours after breakfast, lunch and dinner (4 checks daily) 1 kit 0  . clonazePAM (KLONOPIN) 1 MG tablet Take 1 mg by mouth 2 (two) times daily.    . ferrous gluconate (FERGON) 324 MG tablet Take 324 mg by mouth.    . folic acid (FOLVITE) 1 MG tablet Take 3 mg by mouth daily.    . Marland Kitchenlucose blood (ACCU-CHEK SMARTVIEW) test strip Use as instructed to check blood sugars 100 each 12  . glyBURIDE (DIABETA) 2.5 MG tablet Take 1 tablet (2.5 mg total) by mouth 2 (two) times  daily with a meal. 60 tablet 6  . ranitidine (ZANTAC) 150 MG tablet Take 1 tablet (150 mg total) by mouth at bedtime. 30 tablet 10  . FLUoxetine (PROZAC) 40 MG capsule Take 40 mg by mouth daily.    . Iron Combinations (CHROMAGEN) capsule Take 1 capsule by mouth daily. (Patient not taking: Reported on 06/10/2017) 30 capsule 11   No current facility-administered medications on file prior to encounter.     BP 125/61 (BP Location: Right Arm)   Pulse 93   Temp 97.9 F (36.6 C) (Oral)   Resp 18   Wt 228 lb 9.6 oz (103.7 kg)   LMP 12/28/2016 (Approximate)   SpO2 98%   BMI 44.65 kg/m    Anatomy [place]/03/2017  - anatomy suboptimally visualized, no previa, and normal cervix length. Follow-up anatomy [place]/20/2018 - fetal heart still suboptimally visualized Fetal echo 06/18/2017 - normal  RBS today was 88  04/09/2017 - first trimester screen negative  04/26/2017 - CMP with normal LFTs, creatine 0.49; P/C ratio 108 05/10/2017 - TSH 1.79; HbA1C 5.2  CBC    Component Value Date/Time   WBC 13.9 (H) 06/03/2017 0021   RBC 4.95 06/03/2017 0021   HGB 12.8 06/03/2017 0021   HGB 13.1 04/26/2017 1519   HCT 38.4 06/03/2017 0021   HCT 40.5 04/26/2017 1519   PLT 290 06/03/2017 0021   PLT  313 04/26/2017 1519   MCV 77.6 (L) 06/03/2017 0021   05/12/2017 - low MCV, but normal hemoglobin electrophoresis  Impression/Plan:  26 yo gravida 4 para 1031 at 28w1dgestation with:  1. Type 2 DM - stable on current dose of glyburide -- Continue on current dose of glyburide -- The patient was counseled about the target ranges for blood sugars - =/< 95 for fasting BSs and and =/< 120 for 2 hr PP BSs -- Do not hesitate to refer the patient back to uKoreaif more than 1/3 of her blood sugars exceed target values at any time point. -- She still needs an ophthalmological examination - She was encouraged to find an opthalmologist  -- Fetal surveillance as outlined in Dr. SThom Chimesnote of 04/26/2017 -- Delivery planning  once the patient is closer to her estimated due date. Please schedule her to see uKorea around 36 weeks. 2. BMI > 40 -- See above 3. History of seizure disorder - off meds and no seizure > 1 year 4. Previous cesarean delivery - planning for repeat 5. Previous transfusion at delivery  -- Have second line uterotonic in the room 6. ADHD/ Bipolar/Depression - on Prozac, Adderall and Klonopin.  [facility]. 7. Asthma - stable 8. History of anemia - none now. Low MCV, but normal hemoglobin electrophoresis. 9.Cigarette smoking --She still smokes  5 cig per day and again was encouraged to quit.  I spent 30 minutes in consultation, more than half of which was spent counseling the patient and coordinating her care.  AErasmo Score MD [facility]

## 2017-06-28 ENCOUNTER — Ambulatory Visit (INDEPENDENT_AMBULATORY_CARE_PROVIDER_SITE_OTHER): Payer: Medicaid Other | Admitting: Obstetrics & Gynecology

## 2017-06-28 VITALS — BP 120/80 | Wt 230.0 lb

## 2017-06-28 DIAGNOSIS — IMO0001 Reserved for inherently not codable concepts without codable children: Secondary | ICD-10-CM

## 2017-06-28 DIAGNOSIS — Z3A24 24 weeks gestation of pregnancy: Secondary | ICD-10-CM

## 2017-06-28 DIAGNOSIS — G40909 Epilepsy, unspecified, not intractable, without status epilepticus: Secondary | ICD-10-CM

## 2017-06-28 DIAGNOSIS — O99352 Diseases of the nervous system complicating pregnancy, second trimester: Secondary | ICD-10-CM

## 2017-06-28 DIAGNOSIS — O0990 Supervision of high risk pregnancy, unspecified, unspecified trimester: Secondary | ICD-10-CM

## 2017-06-28 DIAGNOSIS — O24119 Pre-existing diabetes mellitus, type 2, in pregnancy, unspecified trimester: Secondary | ICD-10-CM

## 2017-06-28 DIAGNOSIS — Z6841 Body Mass Index (BMI) 40.0 and over, adult: Secondary | ICD-10-CM

## 2017-06-28 DIAGNOSIS — O99330 Smoking (tobacco) complicating pregnancy, unspecified trimester: Secondary | ICD-10-CM

## 2017-06-28 DIAGNOSIS — E669 Obesity, unspecified: Secondary | ICD-10-CM

## 2017-06-28 NOTE — Progress Notes (Signed)
  Subjective  Fetal Movement? yes Contractions? no Leaking Fluid? no Vaginal Bleeding? no  Objective  BP 120/80   Wt 230 lb (104.3 kg)   LMP 12/28/2016 (Approximate)   BMI 44.92 kg/m  General: NAD Pumonary: no increased work of breathing Abdomen: gravid, non-tender Extremities: no edema Psychiatric: mood appropriate, affect full  Clinic  Prenatal Labs  Dating  Blood type: O/Positive/-- (07/23 1519)   Genetic Screen 1 Screen:        NIPS: Antibody:Negative (07/23 1519)  Anatomic [place]  Rubella: 2.42 (07/23 1519)  GTT Early:         Third trimester:  RPR: Non Reactive (07/23 1519)   Flu vaccine  HBsAg: Negative (07/23 1519)   TDaP vaccine                          Rhogam: HIV:     Baby Food                                               GBS: (For PCN allergy, check sensitivities)  Contraception  Pap:  CS/VBAC    Cord Blood    Support Person        Assessment   26 y.o. [id number] at [redacted]w[redacted]d by  10/13/2017, by Ultrasound presenting for routine prenatal visit  Plan   Problem List Items Addressed This Visit      Endocrine   Diabetes mellitus during pregnancy, antepartum     Nervous and Auditory   Seizure disorder during pregnancy [facility])     Other   Supervision of high risk pregnancy, antepartum   Class 3 obesity with body mass index (BMI) of 40.0 to 44.9 in adult [facility])   Smoking (tobacco) complicating pregnancy, unspecified trimester    Other Visit Diagnoses    [redacted] weeks gestation of pregnancy    -  Primary    CS 39 weeks Cont Glyburide, BS well maintained. [place] monthly for growth APT 32 weeks  Annamarie Major, MD, [facility], [facility] 06/28/2017  3:26 PM

## 2017-07-12 ENCOUNTER — Ambulatory Visit (INDEPENDENT_AMBULATORY_CARE_PROVIDER_SITE_OTHER): Payer: Medicaid Other | Admitting: Obstetrics & Gynecology

## 2017-07-12 ENCOUNTER — Encounter: Payer: Self-pay | Admitting: Obstetrics & Gynecology

## 2017-07-12 ENCOUNTER — Ambulatory Visit (INDEPENDENT_AMBULATORY_CARE_PROVIDER_SITE_OTHER): Payer: Medicaid Other

## 2017-07-12 VITALS — BP 100/60 | Wt 232.0 lb

## 2017-07-12 DIAGNOSIS — Z6841 Body Mass Index (BMI) 40.0 and over, adult: Secondary | ICD-10-CM

## 2017-07-12 DIAGNOSIS — O0990 Supervision of high risk pregnancy, unspecified, unspecified trimester: Secondary | ICD-10-CM | POA: Diagnosis not present

## 2017-07-12 DIAGNOSIS — Z3A26 26 weeks gestation of pregnancy: Secondary | ICD-10-CM

## 2017-07-12 DIAGNOSIS — Z98891 History of uterine scar from previous surgery: Secondary | ICD-10-CM | POA: Insufficient documentation

## 2017-07-12 DIAGNOSIS — O24119 Pre-existing diabetes mellitus, type 2, in pregnancy, unspecified trimester: Secondary | ICD-10-CM

## 2017-07-12 DIAGNOSIS — IMO0001 Reserved for inherently not codable concepts without codable children: Secondary | ICD-10-CM

## 2017-07-12 DIAGNOSIS — J45909 Unspecified asthma, uncomplicated: Secondary | ICD-10-CM

## 2017-07-12 DIAGNOSIS — O99519 Diseases of the respiratory system complicating pregnancy, unspecified trimester: Secondary | ICD-10-CM

## 2017-07-12 DIAGNOSIS — O99330 Smoking (tobacco) complicating pregnancy, unspecified trimester: Secondary | ICD-10-CM

## 2017-07-12 NOTE — Progress Notes (Signed)
BS - per pt under good control    Still on Glyburide    Korea growth monthly    APT 36 weeks    CS 40 weeks (desires TOLAC) Review of ULTRASOUND.    I have personally reviewed images and report of recent ultrasound done at [facility].    Plan of management to be discussed with patient.

## 2017-07-26 ENCOUNTER — Encounter: Payer: Medicaid Other | Admitting: Obstetrics and Gynecology

## 2017-07-27 ENCOUNTER — Ambulatory Visit (INDEPENDENT_AMBULATORY_CARE_PROVIDER_SITE_OTHER): Payer: Medicaid Other | Admitting: Certified Nurse Midwife

## 2017-07-27 ENCOUNTER — Encounter: Payer: Medicaid Other | Admitting: Obstetrics and Gynecology

## 2017-07-27 ENCOUNTER — Ambulatory Visit: Payer: Medicaid Other | Admitting: Obstetrics and Gynecology

## 2017-07-27 VITALS — BP 118/74 | Wt 232.0 lb

## 2017-07-27 DIAGNOSIS — Z98891 History of uterine scar from previous surgery: Secondary | ICD-10-CM

## 2017-07-27 DIAGNOSIS — O0990 Supervision of high risk pregnancy, unspecified, unspecified trimester: Secondary | ICD-10-CM

## 2017-07-27 DIAGNOSIS — Z6841 Body Mass Index (BMI) 40.0 and over, adult: Secondary | ICD-10-CM

## 2017-07-27 DIAGNOSIS — Z130 Encounter for screening for diseases of the blood and blood-forming organs and certain disorders involving the immune mechanism: Secondary | ICD-10-CM

## 2017-07-27 DIAGNOSIS — O24113 Pre-existing diabetes mellitus, type 2, in pregnancy, third trimester: Secondary | ICD-10-CM

## 2017-07-27 DIAGNOSIS — Z3A28 28 weeks gestation of pregnancy: Secondary | ICD-10-CM

## 2017-07-27 NOTE — Progress Notes (Unsigned)
28 week labs today. No vb. No lof.  

## 2017-07-28 LAB — CBC WITH DIFFERENTIAL/PLATELET
BASOS ABS: 0 10*3/uL (ref 0.0–0.2)
Basos: 0 %
EOS (ABSOLUTE): 0.2 10*3/uL (ref 0.0–0.4)
EOS: 1 %
HEMATOCRIT: 35.9 % (ref 34.0–46.6)
HEMOGLOBIN: 11.7 g/dL (ref 11.1–15.9)
IMMATURE GRANULOCYTES: 1 %
Immature Grans (Abs): 0.1 10*3/uL (ref 0.0–0.1)
Lymphocytes Absolute: 2 10*3/uL (ref 0.7–3.1)
Lymphs: 16 %
MCH: 24.6 pg — ABNORMAL LOW (ref 26.6–33.0)
MCHC: 32.6 g/dL (ref 31.5–35.7)
MCV: 75 fL — ABNORMAL LOW (ref 79–97)
MONOCYTES: 6 %
MONOS ABS: 0.7 10*3/uL (ref 0.1–0.9)
NEUTROS PCT: 76 %
Neutrophils Absolute: 9.8 10*3/uL — ABNORMAL HIGH (ref 1.4–7.0)
Platelets: 326 10*3/uL (ref 150–379)
RBC: 4.76 x10E6/uL (ref 3.77–5.28)
RDW: 16.5 % — AB (ref 12.3–15.4)
WBC: 12.6 10*3/uL — AB (ref 3.4–10.8)

## 2017-07-28 LAB — HEMOGLOBIN A1C
ESTIMATED AVERAGE GLUCOSE: 103 mg/dL
HEMOGLOBIN A1C: 5.2 % (ref 4.8–5.6)

## 2017-07-29 ENCOUNTER — Telehealth: Payer: Self-pay | Admitting: Certified Nurse Midwife

## 2017-07-29 ENCOUNTER — Other Ambulatory Visit: Payer: Self-pay | Admitting: Certified Nurse Midwife

## 2017-07-29 MED ORDER — CEPHALEXIN 500 MG PO CAPS: 500.0000 mg | ORAL_CAPSULE | Freq: Three times a day (TID) | ORAL | 0 refills | Status: AC

## 2017-07-29 NOTE — Telephone Encounter (Signed)
Pt aware rx sent in for keflex.

## 2017-07-29 NOTE — Telephone Encounter (Signed)
Patient saw CLG on 10/23, says she was prescribed antibiotic but pharmacy does not have, if this could be sent?  [facility] [address]

## 2017-07-30 ENCOUNTER — Observation Stay
Admission: EM | Admit: 2017-07-30 | Discharge: 2017-07-30 | Disposition: A | Discharge status: Home / Self Care | Payer: Medicaid Other | Attending: Certified Nurse Midwife | Admitting: Certified Nurse Midwife

## 2017-07-30 ENCOUNTER — Telehealth: Payer: Self-pay

## 2017-07-30 ENCOUNTER — Encounter: Payer: Self-pay | Admitting: Certified Nurse Midwife

## 2017-07-30 DIAGNOSIS — O4703 False labor before 37 completed weeks of gestation, third trimester: Principal | ICD-10-CM | POA: Insufficient documentation

## 2017-07-30 DIAGNOSIS — Z91018 Allergy to other foods: Secondary | ICD-10-CM | POA: Insufficient documentation

## 2017-07-30 DIAGNOSIS — O24113 Pre-existing diabetes mellitus, type 2, in pregnancy, third trimester: Secondary | ICD-10-CM | POA: Diagnosis not present

## 2017-07-30 DIAGNOSIS — J45909 Unspecified asthma, uncomplicated: Secondary | ICD-10-CM | POA: Insufficient documentation

## 2017-07-30 DIAGNOSIS — Z91041 Radiographic dye allergy status: Secondary | ICD-10-CM | POA: Diagnosis not present

## 2017-07-30 DIAGNOSIS — F319 Bipolar disorder, unspecified: Secondary | ICD-10-CM | POA: Insufficient documentation

## 2017-07-30 DIAGNOSIS — O99353 Diseases of the nervous system complicating pregnancy, third trimester: Secondary | ICD-10-CM | POA: Insufficient documentation

## 2017-07-30 DIAGNOSIS — O99333 Smoking (tobacco) complicating pregnancy, third trimester: Secondary | ICD-10-CM | POA: Diagnosis not present

## 2017-07-30 DIAGNOSIS — F4310 Post-traumatic stress disorder, unspecified: Secondary | ICD-10-CM | POA: Insufficient documentation

## 2017-07-30 DIAGNOSIS — O99513 Diseases of the respiratory system complicating pregnancy, third trimester: Secondary | ICD-10-CM | POA: Insufficient documentation

## 2017-07-30 DIAGNOSIS — L02425 Furuncle of right lower limb: Secondary | ICD-10-CM | POA: Insufficient documentation

## 2017-07-30 DIAGNOSIS — F419 Anxiety disorder, unspecified: Secondary | ICD-10-CM | POA: Insufficient documentation

## 2017-07-30 DIAGNOSIS — K219 Gastro-esophageal reflux disease without esophagitis: Secondary | ICD-10-CM | POA: Insufficient documentation

## 2017-07-30 DIAGNOSIS — O99343 Other mental disorders complicating pregnancy, third trimester: Secondary | ICD-10-CM | POA: Diagnosis not present

## 2017-07-30 DIAGNOSIS — O99213 Obesity complicating pregnancy, third trimester: Secondary | ICD-10-CM | POA: Diagnosis not present

## 2017-07-30 DIAGNOSIS — Z3A29 29 weeks gestation of pregnancy: Secondary | ICD-10-CM | POA: Diagnosis not present

## 2017-07-30 DIAGNOSIS — O99713 Diseases of the skin and subcutaneous tissue complicating pregnancy, third trimester: Secondary | ICD-10-CM | POA: Diagnosis not present

## 2017-07-30 DIAGNOSIS — O99613 Diseases of the digestive system complicating pregnancy, third trimester: Secondary | ICD-10-CM | POA: Insufficient documentation

## 2017-07-30 DIAGNOSIS — G40909 Epilepsy, unspecified, not intractable, without status epilepticus: Secondary | ICD-10-CM | POA: Insufficient documentation

## 2017-07-30 DIAGNOSIS — Z7984 Long term (current) use of oral hypoglycemic drugs: Secondary | ICD-10-CM | POA: Insufficient documentation

## 2017-07-30 DIAGNOSIS — E669 Obesity, unspecified: Secondary | ICD-10-CM | POA: Insufficient documentation

## 2017-07-30 DIAGNOSIS — Z885 Allergy status to narcotic agent status: Secondary | ICD-10-CM | POA: Insufficient documentation

## 2017-07-30 DIAGNOSIS — Z79899 Other long term (current) drug therapy: Secondary | ICD-10-CM | POA: Insufficient documentation

## 2017-07-30 DIAGNOSIS — E119 Type 2 diabetes mellitus without complications: Secondary | ICD-10-CM | POA: Insufficient documentation

## 2017-07-30 DIAGNOSIS — F17210 Nicotine dependence, cigarettes, uncomplicated: Secondary | ICD-10-CM | POA: Insufficient documentation

## 2017-07-30 DIAGNOSIS — O23593 Infection of other part of genital tract in pregnancy, third trimester: Secondary | ICD-10-CM | POA: Diagnosis not present

## 2017-07-30 DIAGNOSIS — Z886 Allergy status to analgesic agent status: Secondary | ICD-10-CM | POA: Insufficient documentation

## 2017-07-30 DIAGNOSIS — Z888 Allergy status to other drugs, medicaments and biological substances status: Secondary | ICD-10-CM | POA: Insufficient documentation

## 2017-07-30 DIAGNOSIS — Z882 Allergy status to sulfonamides status: Secondary | ICD-10-CM | POA: Insufficient documentation

## 2017-07-30 DIAGNOSIS — Z91040 Latex allergy status: Secondary | ICD-10-CM | POA: Insufficient documentation

## 2017-07-30 LAB — URINALYSIS, COMPLETE (UACMP) WITH MICROSCOPIC
BILIRUBIN URINE: NEGATIVE
Glucose, UA: 50 mg/dL — AB
HGB URINE DIPSTICK: NEGATIVE
Ketones, ur: NEGATIVE mg/dL
LEUKOCYTES UA: NEGATIVE
Nitrite: NEGATIVE
PROTEIN: 30 mg/dL — AB
Specific Gravity, Urine: 1.024 (ref 1.005–1.030)
pH: 6 (ref 5.0–8.0)

## 2017-07-30 NOTE — Telephone Encounter (Signed)
Pt c/o lower abdominal pressure and tight contractions across her belly that radiate to lower pelvis. Also feels like she may have fluid or urine leaking out. Pt states ctx are every 20 minutes and at night it's difficult to breathe and has to take deep breaths to talk. Per [place] BingJaci pt to go to L&D for monitoring. Pt aware.

## 2017-07-30 NOTE — Telephone Encounter (Signed)
Pt calling to report pressure/contractions. Saw CLG 07/27/17 treated for UTI. Cb#[phone]

## 2017-07-30 NOTE — Progress Notes (Signed)
HROB at [id number]: T2DM on glyburide 2.5 mgm BID, ADD ("takes Adderall when she needs it"), Epilepsy (last seizure 2016), Obesity with BMI>40, depression/ PTSD (stopped prozac, takes Klonopin when she needs it), previous CS,  and asthma Taking blood sugars randomly (usually right after eating) and I don't see any fasting blood sugars (reviewed glucometer history, does not keep log) Blood sugars range from 83-132 (don't know if any of those are fasting) Gave blood sugar logs to patient and explained that she needs to do a fasting BS every morning and 2 hours after completing each meal-and to document and bring those logs in  Total weight gain 12 #, but no weight gain over the last 2 weeks.  Last growth scan at 26 weeks (10/8) was at 38th %. Next growth scan scheduled in 2 weeks. Complains of vulvar irritation and skin boils, Has a inflamed boil on inner right thigh and on lower labia. Wet prep was negative for hyphae, Trich and clue cells Will treat boils with Keflex 500 mgm tid x 10 days History of prior CS for active phase arrest (got to 7 cm after 3 day induction at 40wk3d). Postpartum complicated by anemia with hmg 6.1gm/dl-received blood transfusion postpartum Discuss scheduling CS at next visit. RPR/ HIV/ CBC/ hemoglobin A1C today RTO in 2 weeks for growth scan and ROB. Wants to breast and bottle feed. Farrel Connersolleen Landon Bassford, CNM Addendum: hemoglobin A1C was 5.2% and hemoglobin was 11.7gm/dl. RPR and HIV not done-please get at next visit (order in)

## 2017-07-30 NOTE — Discharge Instructions (Signed)
Check blood sugars 4 times per day as directed Increase water intake until urine is light yellow in color   May apply A&D ointment or zinc oxide ointment to vulva for irritation  Take antibiotics as directed

## 2017-07-30 NOTE — Final Progress Note (Signed)
Physician Final Progress Note  Patient ID: Faith [name] MRN: [id number] DOB/AGE: 03/19/1991 26 y.o.  Admit date: 07/30/2017 Admitting provider: Gae Dry, MD Discharge date: 07/30/2017   Admission Diagnoses: IUP at 29 weeks 2 days with leakage of fluid, contractions and pelvic pressure  Discharge Diagnoses:  IUP at 29 weeks2days No evidence of PPROM No evidence of labor   Consults: None  Significant Findings/ Diagnostic Studies: 26 year old G5 P66 with EDC=10/13/2017 presents at 29wk2d gestation with multiple concerns: 1) leakage of clear fluid with odor since last night; 2) Irregular contractions x3 days. 3) Increased pelvic pressure with contractions x 2 days. Baby active. No vaginal bleeding. Had wet prep done on 10/23 and it was negative. She does have a couple of boils on her inner right thigh and lower right labia. Was prescribed Keflex, but she has not started it yet. Prenatal care at [facility] remarkable for T2DM on glyburide 2.5 mgm BID, ADD ("takes Adderall when she needs it"), Epilepsy (last seizure 2016, not on meds), Obesity with BMI>40, depression/ PTSD (stopped prozac, takes Klonopin when she needs it), previous CS,  and asthma. She also smokes 1/2 PPD. She has not been taking her blood sugars correctly and was advised the correct way on her prenatal visit 3 days ago. Her hemoglobin A1C was 5.2% on 10/23 Last growth scan at 26 weeks with EFW=38th%.       Overview EditedMalachy Mood, MD 04/27/2017 [facility] Prenatal Labs  Dating 11wk6d [place] Blood type: O pos  Genetic Screen 1 Screen: negative Antibody:negative  Anatomic [place] normal at DP, normal fetal echo, posterior placenta Rubella: Immune Varicella: Immune  GTT Early: Third trimester:  RPR: NR  Rhogam  HBsAg: negative  TDaP vaccine Flu Shot: HIV: negative  Baby Food Br/BT GBS:   Contraception  Pap:  CBB   Basline P/C ratio 7/23 151m/g  CS/VBAC    Support Person                    Past Medical History:  Diagnosis Date  . ADHD   . Anemia    BLOOD TRANSFUSION AFTER C-SECTION ON 06-2016-2 UNITS  . Anxiety   . Asthma    WELL CONTROLLED  . Bipolar 1 disorder, depressed ([facility]   . Depression   . Diabetes mellitus without complication ([place]   . Gallstones   . GERD (gastroesophageal reflux disease)   . Migraines   . Ovarian cyst   . Seizures ([facility]    LAST SEIZURE 2016  . Tubal pregnancy    Past Surgical History:  Procedure Laterality Date  . CESAREAN SECTION N/A 06/22/2016   Procedure: CESAREAN SECTION;  Surgeon: AMalachy Mood MD;  Location: [facility] ORS;  Service: Obstetrics;  Laterality: N/A;  . CHOLECYSTECTOMY N/A 08/13/2016   Procedure: LAPAROSCOPIC CHOLECYSTECTOMY;  Surgeon: DJules Husbands MD;  Location: [facility] ORS;  Service: General;  Laterality: N/A;  . DILATION AND CURETTAGE OF UTERUS    . ovarian cyst removed    . SALPINGECTOMY Right 2013   ectopic pregnancy  . TONSILLECTOMY    . TONSILLECTOMY AND ADENOIDECTOMY     Social History   Social History  . Marital status: Single    Spouse name: N/A  . Number of children: N/A  . Years of education: N/A   Occupational History  . Not on file.   Social History Main Topics  . Smoking status: Current Every Day Smoker    Packs/day: 0.50    Years: 3.00  Types: Cigarettes  . Smokeless tobacco: Never Used  . Alcohol use No  . Drug use: No  . Sexual activity: Yes    Birth control/ protection: None   Other Topics Concern  . Not on file   Social History Narrative  . No narrative on file   Exam: BP (!) 102/56 (BP Location: Right Arm)   Pulse (!) 102   Temp 98.2 F (36.8 C) (Oral)   Resp 16   Ht 5' (1.524 m)   Wt 105.2 kg (232 lb)   LMP 12/28/2016 (Approximate)   BMI 45.31 kg/m   General: gravid, WF, in NAD Abdomen: obese, gravid, NT, soft FHT: 135 with accelerations to 150s to 160s, moderate variability Toco: one contraction seen over 45 minutes Pelvic: Vulva: inflamed, irritated, white  discharge present in vestibule  One boil on inner Vagina: white discharge Cervix: long/closed/OOP Wet prep: negative hyphae, trich, clue cells Nitrazine negative  Results for orders placed or performed during the hospital encounter of 07/30/17 (from the past 24 hour(s))  Urinalysis, Complete w Microscopic     Status: Abnormal   Collection Time: 07/30/17  4:24 PM  Result Value Ref Range   Color, Urine AMBER (A) YELLOW   APPearance HAZY (A) CLEAR   Specific Gravity, Urine 1.024 1.005 - 1.030   pH 6.0 5.0 - 8.0   Glucose, UA 50 (A) NEGATIVE mg/dL   Hgb urine dipstick NEGATIVE NEGATIVE   Bilirubin Urine NEGATIVE NEGATIVE   Ketones, ur NEGATIVE NEGATIVE mg/dL   Protein, ur 30 (A) NEGATIVE mg/dL   Nitrite NEGATIVE NEGATIVE   Leukocytes, UA NEGATIVE NEGATIVE   RBC / HPF 0-5 0 - 5 RBC/hpf   WBC, UA 0-5 0 - 5 WBC/hpf   Bacteria, UA RARE (A) NONE SEEN   Squamous Epithelial / LPF 6-30 (A) NONE SEEN   Mucus PRESENT    Ca Oxalate Crys, UA PRESENT   A: IUP at 29wk2d with no evidence of PPROM or labor Cat 1 tracing P: Discharge home with PTL precautions Begin Keflex for skin boils FU appt already scheduled for growth scan and ROB at [facility]   Procedures: none  Discharge Condition: stable  Disposition: 01-Home or Self Care  Diet: Regular diet/ Restricted CHO diet  Discharge Activity: Activity as tolerated  Discharge Instructions    Discharge patient    Complete by:  As directed    Discharge disposition:  01-Home or Self Care   Discharge patient date:  07/30/2017     Allergies as of 07/30/2017      Reactions   Ivp Dye [iodinated Diagnostic Agents] Anaphylaxis   Morphine Other (See Comments)   bradycradia bradycradia   Sulfasalazine Hives   Tomato Anaphylaxis   Toradol [ketorolac Tromethamine] Anaphylaxis, Hives, Swelling   Dilaudid [hydromorphone Hcl] Hives   Latex Rash   Sulfa Antibiotics Hives   Tramadol Swelling, Rash, Hives   Aspirin Hives   Hydrocodone-acetaminophen  Other (See Comments)   Iohexol Other (See Comments)   Pt. States she can not take iohexol but won't state reaction type. Pt. States she can not take iohexol but won't state reaction type. Pt. States she can not take iohexol but won't state reaction type.   Lamictal [lamotrigine] Other (See Comments)   Mushroom Extract Complex Rash      Medication List    TAKE these medications   ACCU-CHEK FASTCLIX LANCETS Misc 1 Units by Percutaneous route 4 (four) times daily.   ACCU-CHEK NANO SMARTVIEW w/Device Kit 1 kit by Subdermal  route as directed. Check blood sugars for fasting, and two hours after breakfast, lunch and dinner (4 checks daily)   albuterol 108 (90 Base) MCG/ACT inhaler Commonly known as:  PROVENTIL HFA;VENTOLIN HFA Inhale 2 puffs into the lungs every 6 (six) hours as needed. For shortness of breath   amphetamine-dextroamphetamine 10 MG tablet Commonly known as:  ADDERALL Take 10 mg by mouth daily with breakfast.   blood glucose meter kit and supplies Kit Dispense based on patient and insurance preference. Use up to four times daily as directed. (FOR ICD-9 250.00, 250.01).   cephALEXin 500 MG capsule Commonly known as:  KEFLEX Take 1 capsule (500 mg total) by mouth 3 (three) times daily.   chromagen capsule Take 1 capsule by mouth daily.   clonazePAM 1 MG tablet Commonly known as:  KLONOPIN Take 1 mg by mouth 2 (two) times daily.   ferrous gluconate 324 MG tablet Commonly known as:  FERGON Take 324 mg by mouth.   FLUoxetine 40 MG capsule Commonly known as:  PROZAC Take 40 mg by mouth daily.   folic acid 1 MG tablet Commonly known as:  FOLVITE Take 3 mg by mouth daily.   glucose blood test strip Commonly known as:  ACCU-CHEK SMARTVIEW Use as instructed to check blood sugars   glyBURIDE 2.5 MG tablet Commonly known as:  DIABETA Take 1 tablet (2.5 mg total) by mouth 2 (two) times daily with a meal.   ranitidine 150 MG tablet Commonly known as:   ZANTAC Take 1 tablet (150 mg total) by mouth at bedtime.        Total time spent taking care of this patient: 20 minutes  Signed: Dalia Heading 07/30/2017, 6:09 PM

## 2017-07-30 NOTE — OB Triage Note (Signed)
[name] here with c/o ctx for 2 days, LOF, pressure, increased urge to urinate, reports back aching, pulling into chest muscle, thinks is from gallbladder surgery. occasional dizziness, has not lost consciousness. Reports positive fetal movement. Denies bleeding.

## 2017-08-02 LAB — URINE CULTURE: SPECIAL REQUESTS: NORMAL

## 2017-08-04 ENCOUNTER — Other Ambulatory Visit: Payer: Self-pay | Admitting: Certified Nurse Midwife

## 2017-08-04 DIAGNOSIS — Z3689 Encounter for other specified antenatal screening: Secondary | ICD-10-CM

## 2017-08-09 ENCOUNTER — Ambulatory Visit (INDEPENDENT_AMBULATORY_CARE_PROVIDER_SITE_OTHER): Payer: Medicaid Other

## 2017-08-09 ENCOUNTER — Encounter: Payer: Self-pay | Admitting: Obstetrics and Gynecology

## 2017-08-09 ENCOUNTER — Ambulatory Visit (INDEPENDENT_AMBULATORY_CARE_PROVIDER_SITE_OTHER): Payer: Medicaid Other | Admitting: Obstetrics and Gynecology

## 2017-08-09 ENCOUNTER — Ambulatory Visit: Payer: Medicaid Other | Admitting: Obstetrics & Gynecology

## 2017-08-09 VITALS — BP 108/68 | Wt 233.0 lb

## 2017-08-09 DIAGNOSIS — F329 Major depressive disorder, single episode, unspecified: Secondary | ICD-10-CM

## 2017-08-09 DIAGNOSIS — Z23 Encounter for immunization: Secondary | ICD-10-CM

## 2017-08-09 DIAGNOSIS — O0993 Supervision of high risk pregnancy, unspecified, third trimester: Secondary | ICD-10-CM | POA: Diagnosis not present

## 2017-08-09 DIAGNOSIS — O09299 Supervision of pregnancy with other poor reproductive or obstetric history, unspecified trimester: Secondary | ICD-10-CM

## 2017-08-09 DIAGNOSIS — O24119 Pre-existing diabetes mellitus, type 2, in pregnancy, unspecified trimester: Secondary | ICD-10-CM

## 2017-08-09 DIAGNOSIS — O0990 Supervision of high risk pregnancy, unspecified, unspecified trimester: Secondary | ICD-10-CM

## 2017-08-09 DIAGNOSIS — Z3689 Encounter for other specified antenatal screening: Secondary | ICD-10-CM | POA: Diagnosis not present

## 2017-08-09 DIAGNOSIS — O99330 Smoking (tobacco) complicating pregnancy, unspecified trimester: Secondary | ICD-10-CM

## 2017-08-09 DIAGNOSIS — O99343 Other mental disorders complicating pregnancy, third trimester: Secondary | ICD-10-CM

## 2017-08-09 DIAGNOSIS — Z98891 History of uterine scar from previous surgery: Secondary | ICD-10-CM

## 2017-08-09 NOTE — Progress Notes (Signed)
Growth scan today. Pt reports no problems. Tdap and flu shot today, counseled by Dr. Jerene PitchSchuman. Unable to give urine sample.      Routine Prenatal Care Visit  Subjective  Faith [name] is a 26 y.o. [phone] at 2267w5d being seen today for ongoing prenatal care.  She is currently monitored for the following issues for this high-risk pregnancy and has Status epilepticus (HCC); Diabetes mellitus type 2 in obese (HCC); Depression; Suicidal ideation; Post traumatic stress disorder (PTSD); ADD (attention deficit disorder); Diabetes mellitus during pregnancy, antepartum; Cholecystitis, acute; Supervision of high risk pregnancy, antepartum; BMI 40.0-44.9, adult (HCC); History of anemia; History of maternal blood transfusion, currently pregnant; Asthma affecting pregnancy, antepartum; Smoking (tobacco) complicating pregnancy, unspecified trimester; Seizure disorder during pregnancy (HCC); Family history of Huntington's disease; Abdominal pain affecting pregnancy, antepartum; History of cesarean delivery; and Indication for care in labor and delivery, antepartum on their problem list.  ----------------------------------------------------------------------------------- Patient reports no complaints.    .  .   . Denies leaking of fluid.  ----------------------------------------------------------------------------------- The following portions of the patient's history were reviewed and updated as appropriate: allergies, current medications, past family history, past medical history, past social history, past surgical history and problem list. Problem list updated.   Objective  Blood pressure 108/68, weight 233 lb (105.7 kg), last menstrual period 12/28/2016, not currently breastfeeding. Pregravid weight 220 lb (99.8 kg) Total Weight Gain 13 lb (5.897 kg) Urinalysis:      Fetal Status:           General:  Alert, oriented and cooperative. Patient is in no acute distress.  Skin: Skin is warm and dry. No rash  noted.   Cardiovascular: Normal heart rate noted  Respiratory: Normal respiratory effort, no problems with respiration noted  Abdomen: Soft, gravid, appropriate for gestational age.       Pelvic:  Cervical exam deferred        Extremities: Normal range of motion.     ental Status: Normal mood and affect. Normal behavior. Normal judgment and thought content.     Assessment   26 y.o. [id number] at 2967w5d by  10/13/2017, by Ultrasound presenting for routine prenatal visit  Plan   pregnancy 2 Problems (from 12/28/16 to present)    Problem Noted Resolved   Asthma affecting pregnancy, antepartum 05/10/2017 by Lady DeutscherJames, Andra, MD No   Smoking (tobacco) complicating pregnancy, unspecified trimester 05/10/2017 by Lady DeutscherJames, Andra, MD No   Seizure disorder during pregnancy [facility](HCC) 05/10/2017 by Lady DeutscherJames, Andra, MD No   History of anemia 04/19/2017 by Lady DeutscherJames, Andra, MD No   History of maternal blood transfusion, currently pregnant 04/19/2017 by Lady DeutscherJames, Andra, MD No      T2DM on glyburide 2.5 mg BID,  Did not bring her glucose meter. Does not keep a glucose log.  Encouraged patient to bring in a log so the best care can be provided to the patient and her infant. She reports in the morning her blood glucose is 80-90 and two hours after meals 120-140. History of previous cesarean section, Active phase arrest at 7cm after 3 day induction. Post-partum blood transfusion. Discuss elective repeat vs TOLAC at next visit.  ADD- takes Adderall PRN  Epilepsy - last seizure 2016  Obesity with BMI>40 - growth US today within normal limits. Repeat US in 4 weeks. Hx of depression/ PTSD -stopped prozac, takes Klonopin PRN. Screen early for postpartum depression.   Return in about 2 weeks (around 08/23/2017) for ROB in 2 weeks, growth US and ROB in 4  weeks.

## 2017-08-23 ENCOUNTER — Encounter: Payer: Medicaid Other | Admitting: Obstetrics & Gynecology

## 2017-09-06 ENCOUNTER — Ambulatory Visit (INDEPENDENT_AMBULATORY_CARE_PROVIDER_SITE_OTHER): Payer: Medicaid Other | Admitting: Obstetrics & Gynecology

## 2017-09-06 ENCOUNTER — Other Ambulatory Visit: Payer: Medicaid Other

## 2017-09-06 VITALS — BP 120/80 | Wt 235.0 lb

## 2017-09-06 DIAGNOSIS — Z3A34 34 weeks gestation of pregnancy: Secondary | ICD-10-CM

## 2017-09-06 DIAGNOSIS — N390 Urinary tract infection, site not specified: Secondary | ICD-10-CM

## 2017-09-06 DIAGNOSIS — O0990 Supervision of high risk pregnancy, unspecified, unspecified trimester: Secondary | ICD-10-CM | POA: Diagnosis not present

## 2017-09-06 DIAGNOSIS — O99519 Diseases of the respiratory system complicating pregnancy, unspecified trimester: Secondary | ICD-10-CM

## 2017-09-06 DIAGNOSIS — J45909 Unspecified asthma, uncomplicated: Secondary | ICD-10-CM | POA: Diagnosis not present

## 2017-09-06 LAB — POCT URINALYSIS DIPSTICK
BILIRUBIN UA: NEGATIVE
GLUCOSE UA: NEGATIVE
Ketones, UA: NEGATIVE
Nitrite, UA: NEGATIVE
PH UA: 7 (ref 5.0–8.0)
Protein, UA: NEGATIVE
RBC UA: NEGATIVE
SPEC GRAV UA: 1.02 (ref 1.010–1.025)
Urobilinogen, UA: 0.2 E.U./dL

## 2017-09-06 MED ORDER — NITROFURANTOIN MONOHYD MACRO 100 MG PO CAPS: 100.0000 mg | ORAL_CAPSULE | Freq: Two times a day (BID) | ORAL | 1 refills | Status: DC

## 2017-09-06 NOTE — Patient Instructions (Signed)
Third Trimester of Pregnancy The third trimester is from week 28 through week 40 (months 7 through 9). The third trimester is a time when the unborn baby (fetus) is growing rapidly. At the end of the ninth month, the fetus is about 20 inches in length and weighs 6-10 pounds. Body changes during your third trimester Your body will continue to go through many changes during pregnancy. The changes vary from woman to woman. During the third trimester:  Your weight will continue to increase. You can expect to gain 25-35 pounds (11-16 kg) by the end of the pregnancy.  You may begin to get stretch marks on your hips, abdomen, and breasts.  You may urinate more often because the fetus is moving lower into your pelvis and pressing on your bladder.  You may develop or continue to have heartburn. This is caused by increased hormones that slow down muscles in the digestive tract.  You may develop or continue to have constipation because increased hormones slow digestion and cause the muscles that push waste through your intestines to relax.  You may develop hemorrhoids. These are swollen veins (varicose veins) in the rectum that can itch or be painful.  You may develop swollen, bulging veins (varicose veins) in your legs.  You may have increased body aches in the pelvis, back, or thighs. This is due to weight gain and increased hormones that are relaxing your joints.  You may have changes in your hair. These can include thickening of your hair, rapid growth, and changes in texture. Some women also have hair loss during or after pregnancy, or hair that feels dry or thin. Your hair will most likely return to normal after your baby is born.  Your breasts will continue to grow and they will continue to become tender. A yellow fluid (colostrum) may leak from your breasts. This is the first milk you are producing for your baby.  Your belly button may stick out.  You may notice more swelling in your hands,  face, or ankles.  You may have increased tingling or numbness in your hands, arms, and legs. The skin on your belly may also feel numb.  You may feel short of breath because of your expanding uterus.  You may have more problems sleeping. This can be caused by the size of your belly, increased need to urinate, and an increase in your body's metabolism.  You may notice the fetus "dropping," or moving lower in your abdomen (lightening).  You may have increased vaginal discharge.  You may notice your joints feel loose and you may have pain around your pelvic bone.  What to expect at prenatal visits You will have prenatal exams every 2 weeks until week 36. Then you will have weekly prenatal exams. During a routine prenatal visit:  You will be weighed to make sure you and the baby are growing normally.  Your blood pressure will be taken.  Your abdomen will be measured to track your baby's growth.  The fetal heartbeat will be listened to.  Any test results from the previous visit will be discussed.  You may have a cervical check near your due date to see if your cervix has softened or thinned (effaced).  You will be tested for Group B streptococcus. This happens between 35 and 37 weeks.  Your health care provider may ask you:  What your birth plan is.  How you are feeling.  If you are feeling the baby move.  If you have had   any abnormal symptoms, such as leaking fluid, bleeding, severe headaches, or abdominal cramping.  If you are using any tobacco products, including cigarettes, chewing tobacco, and electronic cigarettes.  If you have any questions.  Other tests or screenings that may be performed during your third trimester include:  Blood tests that check for low iron levels (anemia).  Fetal testing to check the health, activity level, and growth of the fetus. Testing is done if you have certain medical conditions or if there are problems during the  pregnancy.  Nonstress test (NST). This test checks the health of your baby to make sure there are no signs of problems, such as the baby not getting enough oxygen. During this test, a belt is placed around your belly. The baby is made to move, and its heart rate is monitored during movement.  What is false labor? False labor is a condition in which you feel small, irregular tightenings of the muscles in the womb (contractions) that usually go away with rest, changing position, or drinking water. These are called Braxton Hicks contractions. Contractions may last for hours, days, or even weeks before true labor sets in. If contractions come at regular intervals, become more frequent, increase in intensity, or become painful, you should see your health care provider. What are the signs of labor?  Abdominal cramps.  Regular contractions that start at 10 minutes apart and become stronger and more frequent with time.  Contractions that start on the top of the uterus and spread down to the lower abdomen and back.  Increased pelvic pressure and dull back pain.  A watery or bloody mucus discharge that comes from the vagina.  Leaking of amniotic fluid. This is also known as your "water breaking." It could be a slow trickle or a gush. Let your health care provider know if it has a color or strange odor. If you have any of these signs, call your health care provider right away, even if it is before your due date. Follow these instructions at home: Medicines  Follow your health care provider's instructions regarding medicine use. Specific medicines may be either safe or unsafe to take during pregnancy.  Take a prenatal vitamin that contains at least 600 micrograms (mcg) of folic acid.  If you develop constipation, try taking a stool softener if your health care provider approves. Eating and drinking  Eat a balanced diet that includes fresh fruits and vegetables, whole grains, good sources of protein  such as meat, eggs, or tofu, and low-fat dairy. Your health care provider will help you determine the amount of weight gain that is right for you.  Avoid raw meat and uncooked cheese. These carry germs that can cause birth defects in the baby.  If you have low calcium intake from food, talk to your health care provider about whether you should take a daily calcium supplement.  Eat four or five small meals rather than three large meals a day.  Limit foods that are high in fat and processed sugars, such as fried and sweet foods.  To prevent constipation: ? Drink enough fluid to keep your urine clear or pale yellow. ? Eat foods that are high in fiber, such as fresh fruits and vegetables, whole grains, and beans. Activity  Exercise only as directed by your health care provider. Most women can continue their usual exercise routine during pregnancy. Try to exercise for 30 minutes at least 5 days a week. Stop exercising if you experience uterine contractions.  Avoid heavy   lifting.  Do not exercise in extreme heat or humidity, or at high altitudes.  Wear low-heel, comfortable shoes.  Practice good posture.  You may continue to have sex unless your health care provider tells you otherwise. Relieving pain and discomfort  Take frequent breaks and rest with your legs elevated if you have leg cramps or low back pain.  Take warm sitz baths to soothe any pain or discomfort caused by hemorrhoids. Use hemorrhoid cream if your health care provider approves.  Wear a good support bra to prevent discomfort from breast tenderness.  If you develop varicose veins: ? Wear support pantyhose or compression stockings as told by your healthcare provider. ? Elevate your feet for 15 minutes, 3-4 times a day. Prenatal care  Write down your questions. Take them to your prenatal visits.  Keep all your prenatal visits as told by your health care provider. This is important. Safety  Wear your seat belt at  all times when driving.  Make a list of emergency phone numbers, including numbers for family, friends, the hospital, and police and fire departments. General instructions  Avoid cat litter boxes and soil used by cats. These carry germs that can cause birth defects in the baby. If you have a cat, ask someone to clean the litter box for you.  Do not travel far distances unless it is absolutely necessary and only with the approval of your health care provider.  Do not use hot tubs, steam rooms, or saunas.  Do not drink alcohol.  Do not use any products that contain nicotine or tobacco, such as cigarettes and e-cigarettes. If you need help quitting, ask your health care provider.  Do not use any medicinal herbs or unprescribed drugs. These chemicals affect the formation and growth of the baby.  Do not douche or use tampons or scented sanitary pads.  Do not cross your legs for long periods of time.  To prepare for the arrival of your baby: ? Take prenatal classes to understand, practice, and ask questions about labor and delivery. ? Make a trial run to the hospital. ? Visit the hospital and tour the maternity area. ? Arrange for maternity or paternity leave through employers. ? Arrange for family and friends to take care of pets while you are in the hospital. ? Purchase a rear-facing car seat and make sure you know how to install it in your car. ? Pack your hospital bag. ? Prepare the baby's nursery. Make sure to remove all pillows and stuffed animals from the baby's crib to prevent suffocation.  Visit your dentist if you have not gone during your pregnancy. Use a soft toothbrush to brush your teeth and be gentle when you floss. Contact a health care provider if:  You are unsure if you are in labor or if your water has broken.  You become dizzy.  You have mild pelvic cramps, pelvic pressure, or nagging pain in your abdominal area.  You have lower back pain.  You have persistent  nausea, vomiting, or diarrhea.  You have an unusual or bad smelling vaginal discharge.  You have pain when you urinate. Get help right away if:  Your water breaks before 37 weeks.  You have regular contractions less than 5 minutes apart before 37 weeks.  You have a fever.  You are leaking fluid from your vagina.  You have spotting or bleeding from your vagina.  You have severe abdominal pain or cramping.  You have rapid weight loss or weight gain.    You have shortness of breath with chest pain.  You notice sudden or extreme swelling of your face, hands, ankles, feet, or legs.  Your baby makes fewer than 10 movements in 2 hours.  You have severe headaches that do not go away when you take medicine.  You have vision changes. Summary  The third trimester is from week 28 through week 40, months 7 through 9. The third trimester is a time when the unborn baby (fetus) is growing rapidly.  During the third trimester, your discomfort may increase as you and your baby continue to gain weight. You may have abdominal, leg, and back pain, sleeping problems, and an increased need to urinate.  During the third trimester your breasts will keep growing and they will continue to become tender. A yellow fluid (colostrum) may leak from your breasts. This is the first milk you are producing for your baby.  False labor is a condition in which you feel small, irregular tightenings of the muscles in the womb (contractions) that eventually go away. These are called Braxton Hicks contractions. Contractions may last for hours, days, or even weeks before true labor sets in.  Signs of labor can include: abdominal cramps; regular contractions that start at 10 minutes apart and become stronger and more frequent with time; watery or bloody mucus discharge that comes from the vagina; increased pelvic pressure and dull back pain; and leaking of amniotic fluid. This information is not intended to replace advice  given to you by your health care provider. Make sure you discuss any questions you have with your health care provider. Document Released: 09/15/2001 Document Revised: 02/27/2016 Document Reviewed: 11/22/2012 [employer]vier Interactive Patient Education  2017 [employer]  

## 2017-09-06 NOTE — Progress Notes (Signed)
  HPI:      [name] is a 26 y.o. [phone] who LMP was Patient's last menstrual period was 12/28/2016 (approximate)., presents today for a problem visit.    Urinary Tract Infection: Patient complains of abnormal smelling urine and burning with urination . She has had symptoms for 1 day. Patient also complains of back pain. Patient denies fever. Patient does not have a history of recurrent UTI.  Patient does not have a history of pyelonephritis.   PMHx: She  has a past medical history of ADHD, Anemia, Anxiety, Asthma, Bipolar 1 disorder, depressed ([place]), Depression, Diabetes mellitus without complication ([place]), Gallstones, GERD (gastroesophageal reflux disease), Migraines, Ovarian cyst, Seizures ([place]), and Tubal pregnancy. Also,  has a past surgical history that includes Dilation and curettage of uterus; ovarian cyst removed; Tonsillectomy; Tonsillectomy and adenoidectomy; Salpingectomy (Right, 2013); Cesarean section (N/A, 06/22/2016); and Cholecystectomy (N/A, 08/13/2016)., family history includes Heart disease in her father; Huntington's disease in her mother.,  reports that she has been smoking cigarettes.  She has a 1.50 pack-year smoking history. she has never used smokeless tobacco. She reports that she does not drink alcohol or use drugs.  She has a current medication list which includes the following prescription(s): accu-chek fastclix lancets, albuterol, amphetamine-dextroamphetamine, amphetamine-dextroamphetamine, blood glucose meter kit and supplies, accu-chek nano smartview, clonazepam, clonazepam, ferrous gluconate, fluoxetine, folic acid, glucose blood, glyburide, nitrofurantoin (macrocrystal-monohydrate), and ranitidine. Also, is allergic to ivp dye [iodinated diagnostic agents]; morphine; sulfasalazine; tomato; toradol [ketorolac tromethamine]; dilaudid [hydromorphone hcl]; latex; sulfa antibiotics; tramadol; aspirin; hydrocodone-acetaminophen; iohexol; lamictal [lamotrigine]; and  mushroom extract complex.  Review of Systems  All other systems reviewed and are negative.   Objective: BP 120/80   Wt 235 lb (106.6 kg)   LMP 12/28/2016 (Approximate)   BMI 45.90 kg/m  Physical Exam  Constitutional: She is oriented to person, place, and time. She appears well-developed and well-nourished. No distress.  Musculoskeletal: Normal range of motion.  Neurological: She is alert and oriented to person, place, and time.  Skin: Skin is warm and dry.  Psychiatric: She has a normal mood and affect.  Vitals reviewed. BACK No CVAT  ASSESSMENT/PLAN:   Acute cystitis  Problem List Items Addressed This Visit      Respiratory   Asthma affecting pregnancy, antepartum     Other   Supervision of high risk pregnancy, antepartum    Other Visit Diagnoses    Urinary tract infection without hematuria, site unspecified    -  Primary   Relevant Medications   nitrofurantoin, macrocrystal-monohydrate, (MACROBID) 100 MG capsule   Other Relevant Orders   POCT urinalysis dipstick (Completed)   [redacted] weeks gestation of pregnancy        Also, sch CS for Jan 8 w Dr Georgianne Fick per request (he did her first CS) NST AFI nv  Barnett Applebaum, MD, [facility], [facility] 09/06/2017  3:43 PM

## 2017-09-07 ENCOUNTER — Telehealth: Payer: Self-pay | Admitting: Obstetrics and Gynecology

## 2017-09-07 NOTE — Telephone Encounter (Signed)
Pt is returning call for Faith [name] Please advise

## 2017-09-07 NOTE — Telephone Encounter (Signed)
Lmtrc

## 2017-09-07 NOTE — Telephone Encounter (Signed)
Patient is aware of [facility] on 10/11/17 @ 8:50am w/ Dr. Bonney AidStaebler, Pre-admit Testing afterwards, and OR on 10/12/17.

## 2017-09-07 NOTE — Telephone Encounter (Signed)
-----   Message from Nadara Mustardobert P Harris, MD sent at 09/06/2017  3:45 PM EST ----- Regarding: Surg Sch Surgery Booking Request Patient Full Name:   MRN: [id number]  DOB: September 19, 1991  Surgeon: Bonney AidStaebler, MD  Requested Surgery Date and Time: 10/12/17 Primary Diagnosis AND Code: Repeat CS at Term Secondary Diagnosis and Code: Obesity Surgical Procedure: Cesarean Section L&D Notification: Yes Admission Status: surgery admit Length of Surgery: 1 Special Case Needs: no H&P: yes (date) Phone Interview???: no Interpreter: Language:  Medical Clearance: no Special Scheduling Instructions: no

## 2017-09-08 ENCOUNTER — Ambulatory Visit (INDEPENDENT_AMBULATORY_CARE_PROVIDER_SITE_OTHER): Payer: Medicaid Other | Admitting: Obstetrics & Gynecology

## 2017-09-08 ENCOUNTER — Ambulatory Visit (INDEPENDENT_AMBULATORY_CARE_PROVIDER_SITE_OTHER): Payer: Medicaid Other

## 2017-09-08 VITALS — BP 120/80 | Wt 234.0 lb

## 2017-09-08 DIAGNOSIS — Q777 Spondyloepiphyseal dysplasia: Secondary | ICD-10-CM

## 2017-09-08 DIAGNOSIS — O0993 Supervision of high risk pregnancy, unspecified, third trimester: Secondary | ICD-10-CM | POA: Diagnosis not present

## 2017-09-08 DIAGNOSIS — Z362 Encounter for other antenatal screening follow-up: Secondary | ICD-10-CM

## 2017-09-08 NOTE — Progress Notes (Signed)
Pt seen in follow up for her Ultrasound due to obesity for fetal growth. Review of ULTRASOUND.    I have personally reviewed images and report of [facility].    Plan of management to be discussed with patient. Short limbs discussed.  [facility].   Due to concern for short limb status and its implications a full discussion is had today w pt and a referral set up. A total of 15 minutes were spent face-to-face with the patient during this encounter and over half of that time dealt with counseling and coordination of care. Annamarie MajorPaul Harris, MD, Merlinda FrederickFACOG [facility], [facility] 09/08/2017  5:16 PM

## 2017-09-09 ENCOUNTER — Ambulatory Visit
Admission: RE | Admit: 2017-09-09 | Discharge: 2017-09-09 | Disposition: A | Discharge status: Home / Self Care | Payer: Medicaid Other | Source: Ambulatory Visit | Attending: Maternal & Fetal Medicine | Admitting: Maternal & Fetal Medicine

## 2017-09-09 ENCOUNTER — Other Ambulatory Visit: Payer: Self-pay | Admitting: *Deleted

## 2017-09-09 DIAGNOSIS — O289 Unspecified abnormal findings on antenatal screening of mother: Secondary | ICD-10-CM

## 2017-09-09 DIAGNOSIS — Z3A35 35 weeks gestation of pregnancy: Secondary | ICD-10-CM | POA: Diagnosis not present

## 2017-09-09 DIAGNOSIS — G40909 Epilepsy, unspecified, not intractable, without status epilepticus: Secondary | ICD-10-CM

## 2017-09-09 DIAGNOSIS — O99330 Smoking (tobacco) complicating pregnancy, unspecified trimester: Secondary | ICD-10-CM

## 2017-09-09 DIAGNOSIS — O09299 Supervision of pregnancy with other poor reproductive or obstetric history, unspecified trimester: Secondary | ICD-10-CM

## 2017-09-09 DIAGNOSIS — Z862 Personal history of diseases of the blood and blood-forming organs and certain disorders involving the immune mechanism: Secondary | ICD-10-CM

## 2017-09-09 DIAGNOSIS — O99352 Diseases of the nervous system complicating pregnancy, second trimester: Secondary | ICD-10-CM

## 2017-09-09 DIAGNOSIS — O24913 Unspecified diabetes mellitus in pregnancy, third trimester: Secondary | ICD-10-CM | POA: Diagnosis not present

## 2017-09-09 DIAGNOSIS — O99519 Diseases of the respiratory system complicating pregnancy, unspecified trimester: Secondary | ICD-10-CM

## 2017-09-09 DIAGNOSIS — J45909 Unspecified asthma, uncomplicated: Secondary | ICD-10-CM

## 2017-09-10 ENCOUNTER — Other Ambulatory Visit: Payer: Self-pay | Admitting: Obstetrics and Gynecology

## 2017-09-10 DIAGNOSIS — O0993 Supervision of high risk pregnancy, unspecified, third trimester: Secondary | ICD-10-CM

## 2017-09-14 ENCOUNTER — Other Ambulatory Visit: Payer: Medicaid Other

## 2017-09-14 ENCOUNTER — Encounter: Payer: Medicaid Other | Admitting: Maternal Newborn

## 2017-09-17 ENCOUNTER — Encounter: Payer: Self-pay | Admitting: Obstetrics and Gynecology

## 2017-09-17 ENCOUNTER — Ambulatory Visit (INDEPENDENT_AMBULATORY_CARE_PROVIDER_SITE_OTHER): Payer: Medicaid Other | Admitting: Obstetrics and Gynecology

## 2017-09-17 ENCOUNTER — Ambulatory Visit (INDEPENDENT_AMBULATORY_CARE_PROVIDER_SITE_OTHER): Payer: Medicaid Other

## 2017-09-17 VITALS — BP 128/84 | Wt 236.0 lb

## 2017-09-17 DIAGNOSIS — Z362 Encounter for other antenatal screening follow-up: Secondary | ICD-10-CM | POA: Diagnosis not present

## 2017-09-17 DIAGNOSIS — J45909 Unspecified asthma, uncomplicated: Secondary | ICD-10-CM

## 2017-09-17 DIAGNOSIS — O24119 Pre-existing diabetes mellitus, type 2, in pregnancy, unspecified trimester: Secondary | ICD-10-CM | POA: Diagnosis not present

## 2017-09-17 DIAGNOSIS — O0993 Supervision of high risk pregnancy, unspecified, third trimester: Secondary | ICD-10-CM | POA: Diagnosis not present

## 2017-09-17 DIAGNOSIS — O0990 Supervision of high risk pregnancy, unspecified, unspecified trimester: Secondary | ICD-10-CM

## 2017-09-17 DIAGNOSIS — O99519 Diseases of the respiratory system complicating pregnancy, unspecified trimester: Secondary | ICD-10-CM

## 2017-09-17 DIAGNOSIS — Z6841 Body Mass Index (BMI) 40.0 and over, adult: Secondary | ICD-10-CM

## 2017-09-17 DIAGNOSIS — O99330 Smoking (tobacco) complicating pregnancy, unspecified trimester: Secondary | ICD-10-CM

## 2017-09-17 DIAGNOSIS — Z98891 History of uterine scar from previous surgery: Secondary | ICD-10-CM

## 2017-09-17 DIAGNOSIS — Z3A36 36 weeks gestation of pregnancy: Secondary | ICD-10-CM

## 2017-09-17 LAB — FETAL NONSTRESS TEST

## 2017-09-17 NOTE — Progress Notes (Signed)
Routine Prenatal Care Visit  Subjective  Faith [name] is a 26 y.o. [phone] at 2925w2d being seen today for ongoing prenatal care.  She is currently monitored for the following issues for this high-risk pregnancy and has Status epilepticus (HCC); Diabetes mellitus type 2 in obese (HCC); Depression; Suicidal ideation; Post traumatic stress disorder (PTSD); ADD (attention deficit disorder); Diabetes mellitus during pregnancy, antepartum; Cholecystitis, acute; Supervision of high risk pregnancy, antepartum; BMI 40.0-44.9, adult (HCC); History of anemia; History of maternal blood transfusion, currently pregnant; Asthma affecting pregnancy, antepartum; Smoking (tobacco) complicating pregnancy, unspecified trimester; Seizure disorder during pregnancy (HCC); Family history of Huntington's disease; Abdominal pain affecting pregnancy, antepartum; History of cesarean delivery; and Indication for care in labor and delivery, antepartum on their problem list.  ----------------------------------------------------------------------------------- Patient reports no complaints.    .  .   . Denies leaking of fluid. Denies vaginal bleeding and contractions. Notes +FM. U/s today shows AFI 14cm.  Did not bring BG log.  States most of fastings and 2h pp are within the recommended ranges.  GBS collected today.  ----------------------------------------------------------------------------------- The following portions of the patient's history were reviewed and updated as appropriate: allergies, current medications, past family history, past medical history, past social history, past surgical history and problem list. Problem list updated.  Objective  Blood pressure 128/84, weight 236 lb (107 kg), last menstrual period 12/28/2016, not currently breastfeeding. Pregravid weight 220 lb (99.8 kg) Total Weight Gain 16 lb (7.258 kg) Urinalysis:      Fetal Status:        Presentation: Vertex(14 cm)  General:  Alert, oriented and  cooperative. Patient is in no acute distress.  Skin: Skin is warm and dry. No rash noted.   Cardiovascular: Normal heart rate noted  Respiratory: Normal respiratory effort, no problems with respiration noted  Abdomen: Soft, gravid, appropriate for gestational age.       Pelvic:  Cervical exam deferred        Extremities: Normal range of motion.     Mental Status: Normal mood and affect. Normal behavior. Normal judgment and thought content.   NST Baseline FHR: 120 beats/min Variability: moderate Accelerations: present Decelerations: absent Tocometry: not done  Interpretation:  INDICATIONS: diabetes mellitus RESULTS:  A NST procedure was performed with FHR monitoring and a normal baseline established, appropriate time of 20-40 minutes of evaluation, and accels >2 seen w 15x15 characteristics.  Results show a REACTIVE NST.    Assessment   26 y.o. [id number] at 525w2d by  10/13/2017, by Ultrasound presenting for routine prenatal visit  Plan   pregnancy 2 Problems (from 12/28/16 to present)    Problem Noted Resolved   History of cesarean delivery 07/12/2017 by Nadara MustardHarris, Robert P, MD No   Overview Signed 07/30/2017  9:59 AM by Farrel ConnersGutierrez, Colleen, CNM    Arrest of dilation at 7cm after 3 day IOL Baby 8#7oz Blood transfusion postpartum      Asthma affecting pregnancy, antepartum 05/10/2017 by Lady DeutscherJames, Andra, MD No   Smoking (tobacco) complicating pregnancy, unspecified trimester 05/10/2017 by Lady DeutscherJames, Andra, MD No   Seizure disorder during pregnancy [facility](HCC) 05/10/2017 by Lady DeutscherJames, Andra, MD No   History of anemia 04/19/2017 by Lady DeutscherJames, Andra, MD No   History of maternal blood transfusion, currently pregnant 04/19/2017 by Lady DeutscherJames, Andra, MD No   Supervision of high risk pregnancy, antepartum 03/02/2017 by Tresea MallGledhill, Jane, CNM No   Overview Addendum 08/09/2017  3:11 PM by Natale MilchSchuman, Christanna R, MD    [facility] Prenatal Labs  Dating  Blood type:  O pos  Genetic Screen 1 Screen: negative Antibody:negative   Anatomic US  Rubella: Immune Varicella: Immune  GTT Early:               Third trimester:  RPR: NR  Rhogam  HBsAg: negative  TDaP vaccine                       Flu Shot: HIV: negative  Baby Food                                GBS:   Contraception  Pap:  CBB   Basline P/C ratio 7/23 108mg /g  CS/VBAC    Support Person               BMI 40.0-44.9, adult (HCC) 03/02/2017 by Tresea MallGledhill, Jane, CNM No   Diabetes mellitus during pregnancy, antepartum 01/16/2016 by Lady DeutscherJames, Andra, MD No   Overview Addendum 06/11/2017  4:45 PM by Vena AustriaStaebler, Andreas, MD    [ ]  currently on metformin [ ]  needs ophthomology appt, pt to arrange [X]  [facility] weeks [facility] no further follow up recommended [x]  Serial growth u/s, 3rd trimester [x]  consider [facility] PN consult         Preterm labor symptoms and general obstetric precautions including but not limited to vaginal bleeding, contractions, leaking of fluid and fetal movement were reviewed in detail with the patient. Please refer to After Visit Summary for other counseling recommendations.   Return in about 4 days (around 09/21/2017) for schedule routine prenatal with NST.  - needs twice weekly ap testing.  Needs NST early next week.   Thomasene MohairStephen Corleone Biegler, MD  09/17/2017 2:24 PM

## 2017-09-19 LAB — GC/CHLAMYDIA PROBE AMP
Chlamydia trachomatis, NAA: NEGATIVE
Neisseria gonorrhoeae by PCR: NEGATIVE

## 2017-09-19 LAB — STREP GP B NAA: Strep Gp B NAA: POSITIVE — AB

## 2017-09-23 ENCOUNTER — Observation Stay
Admission: EM | Admit: 2017-09-23 | Discharge: 2017-09-23 | Disposition: A | Discharge status: Home / Self Care | Payer: Medicaid Other | Attending: Obstetrics and Gynecology | Admitting: Obstetrics and Gynecology

## 2017-09-23 ENCOUNTER — Other Ambulatory Visit: Payer: Self-pay

## 2017-09-23 DIAGNOSIS — O24113 Pre-existing diabetes mellitus, type 2, in pregnancy, third trimester: Secondary | ICD-10-CM | POA: Insufficient documentation

## 2017-09-23 DIAGNOSIS — Z7984 Long term (current) use of oral hypoglycemic drugs: Secondary | ICD-10-CM | POA: Diagnosis not present

## 2017-09-23 DIAGNOSIS — E119 Type 2 diabetes mellitus without complications: Secondary | ICD-10-CM | POA: Insufficient documentation

## 2017-09-23 DIAGNOSIS — F319 Bipolar disorder, unspecified: Secondary | ICD-10-CM | POA: Insufficient documentation

## 2017-09-23 DIAGNOSIS — O24119 Pre-existing diabetes mellitus, type 2, in pregnancy, unspecified trimester: Secondary | ICD-10-CM

## 2017-09-23 DIAGNOSIS — O99352 Diseases of the nervous system complicating pregnancy, second trimester: Secondary | ICD-10-CM

## 2017-09-23 DIAGNOSIS — Z3A37 37 weeks gestation of pregnancy: Secondary | ICD-10-CM

## 2017-09-23 DIAGNOSIS — O0990 Supervision of high risk pregnancy, unspecified, unspecified trimester: Secondary | ICD-10-CM

## 2017-09-23 DIAGNOSIS — O471 False labor at or after 37 completed weeks of gestation: Secondary | ICD-10-CM | POA: Diagnosis not present

## 2017-09-23 DIAGNOSIS — O99519 Diseases of the respiratory system complicating pregnancy, unspecified trimester: Secondary | ICD-10-CM

## 2017-09-23 DIAGNOSIS — Z886 Allergy status to analgesic agent status: Secondary | ICD-10-CM | POA: Diagnosis not present

## 2017-09-23 DIAGNOSIS — O26893 Other specified pregnancy related conditions, third trimester: Secondary | ICD-10-CM | POA: Diagnosis not present

## 2017-09-23 DIAGNOSIS — O99333 Smoking (tobacco) complicating pregnancy, third trimester: Secondary | ICD-10-CM | POA: Diagnosis not present

## 2017-09-23 DIAGNOSIS — O99343 Other mental disorders complicating pregnancy, third trimester: Secondary | ICD-10-CM | POA: Insufficient documentation

## 2017-09-23 DIAGNOSIS — Z98891 History of uterine scar from previous surgery: Secondary | ICD-10-CM

## 2017-09-23 DIAGNOSIS — O99330 Smoking (tobacco) complicating pregnancy, unspecified trimester: Secondary | ICD-10-CM

## 2017-09-23 DIAGNOSIS — Z862 Personal history of diseases of the blood and blood-forming organs and certain disorders involving the immune mechanism: Secondary | ICD-10-CM

## 2017-09-23 DIAGNOSIS — M545 Low back pain: Secondary | ICD-10-CM

## 2017-09-23 DIAGNOSIS — Z79899 Other long term (current) drug therapy: Secondary | ICD-10-CM | POA: Insufficient documentation

## 2017-09-23 DIAGNOSIS — J45909 Unspecified asthma, uncomplicated: Secondary | ICD-10-CM

## 2017-09-23 DIAGNOSIS — Z6841 Body Mass Index (BMI) 40.0 and over, adult: Secondary | ICD-10-CM

## 2017-09-23 DIAGNOSIS — Z91040 Latex allergy status: Secondary | ICD-10-CM | POA: Diagnosis not present

## 2017-09-23 DIAGNOSIS — Z881 Allergy status to other antibiotic agents status: Secondary | ICD-10-CM | POA: Insufficient documentation

## 2017-09-23 DIAGNOSIS — Z91041 Radiographic dye allergy status: Secondary | ICD-10-CM | POA: Insufficient documentation

## 2017-09-23 DIAGNOSIS — Z882 Allergy status to sulfonamides status: Secondary | ICD-10-CM | POA: Insufficient documentation

## 2017-09-23 DIAGNOSIS — G40909 Epilepsy, unspecified, not intractable, without status epilepticus: Secondary | ICD-10-CM

## 2017-09-23 DIAGNOSIS — O09299 Supervision of pregnancy with other poor reproductive or obstetric history, unspecified trimester: Secondary | ICD-10-CM

## 2017-09-23 NOTE — Discharge Summary (Signed)
Physician Final Progress Note  Patient ID: Faith [name] MRN: [id number] DOB/AGE: 28-Feb-1991 26 y.o.  Admit date: 09/23/2017 Admitting provider: Malachy Mood, MD Discharge date: 09/23/2017   Admission Diagnoses: low back pain, vaginal pressure since yesterday  Discharge Diagnoses:  Active Problems:   Indication for care in labor and delivery, antepartum 26 yo female with reactive NST, not in labor, low back pain  History of Present Illness: The patient is a 26 y.o. female [phone] at 74w1dwho presents for constant low back pain since yesterday. She has also had vaginal pressure that is intermittent. She feels contractions that come and go but she is unsure of the frequency. She denies vaginal bleeding. She has had some vaginal discharge but denies a gush of fluid. She reports positive fetal movement. She has tried soaking in the tub for her back pain with some relief.    Past Medical History:  Diagnosis Date  . ADHD   . Anemia    BLOOD TRANSFUSION AFTER C-SECTION ON 06-2016-2 UNITS  . Anxiety   . Asthma    WELL CONTROLLED  . Bipolar 1 disorder, depressed ([place]   . Depression   . Diabetes mellitus without complication ([place]   . Gallstones   . GERD (gastroesophageal reflux disease)   . Migraines   . Ovarian cyst   . [place]ures ([place]    LAST SEIZURE 2016  . Tubal pregnancy     Past Surgical History:  Procedure Laterality Date  . CESAREAN SECTION N/A 06/22/2016   Procedure: CESAREAN SECTION;  Surgeon: AMalachy Mood MD;  Location: [facility] ORS;  Service: Obstetrics;  Laterality: N/A;  . CHOLECYSTECTOMY N/A 08/13/2016   Procedure: LAPAROSCOPIC CHOLECYSTECTOMY;  Surgeon: DJules Husbands MD;  Location: [facility] ORS;  Service: General;  Laterality: N/A;  . DILATION AND CURETTAGE OF UTERUS    . ovarian cyst removed    . SALPINGECTOMY Right 2013   ectopic pregnancy  . TONSILLECTOMY    . TONSILLECTOMY AND ADENOIDECTOMY      No current facility-administered medications on file  prior to encounter.    Current Outpatient Medications on File Prior to Encounter  Medication Sig Dispense Refill  . albuterol (PROVENTIL HFA;VENTOLIN HFA) 108 (90 BASE) MCG/ACT inhaler Inhale 2 puffs into the lungs every 6 (six) hours as needed. For shortness of breath    . amphetamine-dextroamphetamine (ADDERALL XR) 10 MG 24 hr capsule Take by mouth.    . clonazePAM (KLONOPIN) 1 MG tablet Take 1 mg by mouth 2 (two) times daily.    . ferrous gluconate (FERGON) 324 MG tablet Take 324 mg by mouth.    . glyBURIDE (DIABETA) 2.5 MG tablet Take 1 tablet (2.5 mg total) by mouth 2 (two) times daily with a meal. 60 tablet 6  . ranitidine (ZANTAC) 150 MG tablet Take 1 tablet (150 mg total) by mouth at bedtime. 30 tablet 10  . ACCU-CHEK FASTCLIX LANCETS MISC 1 Units by Percutaneous route 4 (four) times daily. 100 each 12  . amphetamine-dextroamphetamine (ADDERALL) 10 MG tablet Take 10 mg by mouth daily with breakfast.    . blood glucose meter kit and supplies KIT Dispense based on patient and insurance preference. Use up to four times daily as directed. (FOR ICD-9 250.00, 250.01). 1 each 0  . Blood Glucose Monitoring Suppl (ACCU-CHEK NANO SMARTVIEW) w/Device KIT 1 kit by Subdermal route as directed. Check blood sugars for fasting, and two hours after breakfast, lunch and dinner (4 checks daily) 1 kit 0  . clonazePAM (KLONOPIN) 1  MG tablet Take by mouth.    Marland Kitchen FLUoxetine (PROZAC) 20 MG tablet Take by mouth.    . folic acid (FOLVITE) 1 MG tablet Take 3 mg by mouth daily.    Marland Kitchen glucose blood (ACCU-CHEK SMARTVIEW) test strip Use as instructed to check blood sugars 100 each 12  . nitrofurantoin, macrocrystal-monohydrate, (MACROBID) 100 MG capsule Take 1 capsule (100 mg total) by mouth 2 (two) times daily. (Patient not taking: Reported on 09/09/2017) 14 capsule 1    Allergies  Allergen Reactions  . Ivp Dye [Iodinated Diagnostic Agents] Anaphylaxis  . Morphine Other (See Comments)    bradycradia bradycradia  .  Sulfasalazine Hives  . Tomato Anaphylaxis  . Toradol [Ketorolac Tromethamine] Anaphylaxis, Hives and Swelling  . Dilaudid [Hydromorphone Hcl] Hives  . Latex Rash  . Sulfa Antibiotics Hives  . Tramadol Swelling, Rash and Hives  . Aspirin Hives  . Hydrocodone-Acetaminophen Other (See Comments)  . Iohexol Other (See Comments)    Pt. States she can not take iohexol but won't state reaction type. Pt. States she can not take iohexol but won't state reaction type. Pt. States she can not take iohexol but won't state reaction type.  . Lamictal [Lamotrigine] Other (See Comments)  . Mushroom Extract Complex Rash    Social History   Socioeconomic History  . Marital status: Single    Spouse name: Not on file  . Number of children: Not on file  . Years of education: Not on file  . Highest education level: Not on file  Social Needs  . Financial resource strain: Not on file  . Food insecurity - worry: Not on file  . Food insecurity - inability: Not on file  . Transportation needs - medical: Not on file  . Transportation needs - non-medical: Not on file  Occupational History  . Not on file  Tobacco Use  . Smoking status: Current Every Day Smoker    Packs/day: 0.50    Years: 3.00    Pack years: 1.50    Types: Cigarettes  . Smokeless tobacco: Never Used  Substance and Sexual Activity  . Alcohol use: No    Alcohol/week: 0.0 oz  . Drug use: No  . Sexual activity: Yes    Birth control/protection: None  Other Topics Concern  . Not on file  Social History Narrative  . Not on file    Physical Exam: Temp 98 F (36.7 C) (Oral)   Resp 20   Ht 5' (1.524 m)   Wt 236 lb (107 kg)   LMP 12/28/2016 (Approximate)   BMI 46.09 kg/m   Gen: NAD CV: RRR Pulm: CTAB Pelvic: posterior, closed/thick/high Toco: 2 contractions seen on NST 20 minutes apart Fetal Well Being: 130 bpm, moderate variability, +accelerations, -decelerations Ext: no evidence of DVT, trace edema Urine is tea  color  Consults: None  Significant Findings/ Diagnostic Studies: none  Procedures: NST  Discharge Condition: good  Disposition: 01-Home or Self Care  Diet: Regular diet, drink enough h2o so urine is clear to light yellow  Discharge Activity: Activity as tolerated, comfort measures for discomforts of 3rd trimester of pregnancy  Discharge Instructions    Discharge activity:  No Restrictions   Complete by:  As directed    Discharge diet:  No restrictions   Complete by:  As directed    Fetal Kick Count:  Lie on our left side for one hour after a meal, and count the number of times your baby kicks.  If it is less  than 5 times, get up, move around and drink some juice.  Repeat the test 30 minutes later.  If it is still less than 5 kicks in an hour, notify your doctor.   Complete by:  As directed    LABOR:  When conractions begin, you should start to time them from the beginning of one contraction to the beginning  of the next.  When contractions are 5 - 10 minutes apart or less and have been regular for at least an hour, you should call your health care provider.   Complete by:  As directed    No sexual activity restrictions   Complete by:  As directed    Notify physician for bleeding from the vagina   Complete by:  As directed    Notify physician for blurring of vision or spots before the eyes   Complete by:  As directed    Notify physician for chills or fever   Complete by:  As directed    Notify physician for fainting spells, "black outs" or loss of consciousness   Complete by:  As directed    Notify physician for increase in vaginal discharge   Complete by:  As directed    Notify physician for leaking of fluid   Complete by:  As directed    Notify physician for pain or burning when urinating   Complete by:  As directed    Notify physician for pelvic pressure (sudden increase)   Complete by:  As directed    Notify physician for severe or continued nausea or vomiting   Complete  by:  As directed    Notify physician for sudden gushing of fluid from the vagina (with or without continued leaking)   Complete by:  As directed    Notify physician for sudden, constant, or occasional abdominal pain   Complete by:  As directed    Notify physician if baby moving less than usual   Complete by:  As directed      Allergies as of 09/23/2017      Reactions   Ivp Dye [iodinated Diagnostic Agents] Anaphylaxis   Morphine Other (See Comments)   bradycradia bradycradia   Sulfasalazine Hives   Tomato Anaphylaxis   Toradol [ketorolac Tromethamine] Anaphylaxis, Hives, Swelling   Dilaudid [hydromorphone Hcl] Hives   Latex Rash   Sulfa Antibiotics Hives   Tramadol Swelling, Rash, Hives   Aspirin Hives   Hydrocodone-acetaminophen Other (See Comments)   Iohexol Other (See Comments)   Pt. States she can not take iohexol but won't state reaction type. Pt. States she can not take iohexol but won't state reaction type. Pt. States she can not take iohexol but won't state reaction type.   Lamictal [lamotrigine] Other (See Comments)   Mushroom Extract Complex Rash      Medication List    STOP taking these medications   nitrofurantoin (macrocrystal-monohydrate) 100 MG capsule Commonly known as:  MACROBID     TAKE these medications   ACCU-CHEK FASTCLIX LANCETS Misc 1 Units by Percutaneous route 4 (four) times daily.   ACCU-CHEK NANO SMARTVIEW w/Device Kit 1 kit by Subdermal route as directed. Check blood sugars for fasting, and two hours after breakfast, lunch and dinner (4 checks daily)   albuterol 108 (90 Base) MCG/ACT inhaler Commonly known as:  PROVENTIL HFA;VENTOLIN HFA Inhale 2 puffs into the lungs every 6 (six) hours as needed. For shortness of breath   amphetamine-dextroamphetamine 10 MG 24 hr capsule Commonly known as:  ADDERALL XR  Take by mouth. What changed:  Another medication with the same name was removed. Continue taking this medication, and follow the  directions you see here.   blood glucose meter kit and supplies Kit Dispense based on patient and insurance preference. Use up to four times daily as directed. (FOR ICD-9 250.00, 250.01).   clonazePAM 1 MG tablet Commonly known as:  KLONOPIN Take 1 mg by mouth 2 (two) times daily. What changed:  Another medication with the same name was removed. Continue taking this medication, and follow the directions you see here.   ferrous gluconate 324 MG tablet Commonly known as:  FERGON Take 324 mg by mouth.   FLUoxetine 20 MG tablet Commonly known as:  PROZAC Take by mouth.   folic acid 1 MG tablet Commonly known as:  FOLVITE Take 3 mg by mouth daily.   glucose blood test strip Commonly known as:  ACCU-CHEK SMARTVIEW Use as instructed to check blood sugars   glyBURIDE 2.5 MG tablet Commonly known as:  DIABETA Take 1 tablet (2.5 mg total) by mouth 2 (two) times daily with a meal.   ranitidine 150 MG tablet Commonly known as:  ZANTAC Take 1 tablet (150 mg total) by mouth at bedtime.      [employer]. Go to.   Specialty:  Obstetrics and Gynecology Why:  regular scheduled prenatal appointment Contact information: [address] [place] [address] [phone]          Total time spent taking care of this patient: 15 minutes  Signed: Rod Can, CNM  09/23/2017, 5:11 AM

## 2017-09-23 NOTE — OB Triage Note (Signed)
Pt arrived in triage with complaints of low back pain, constant, starting yesterday around lunch time.  Also c/o vaginal pressure, intermittent, mostly constant at night time. Reports feeling contractions with abdomen getting "really tight" at night time. Reports good fetal movement. Denies vaginal bleeding. Reports vaginal discharge, some mucous, some "running down" her leg.  No fluid visualized and patient denies needing to wear a pad for the leaking.  EFM explained and applied.

## 2017-09-23 NOTE — OB Triage Note (Signed)
Pt given discharge instructions and verbalized understanding. Pt agreed to plan of care.

## 2017-09-29 ENCOUNTER — Ambulatory Visit (INDEPENDENT_AMBULATORY_CARE_PROVIDER_SITE_OTHER): Payer: Medicaid Other | Admitting: Obstetrics & Gynecology

## 2017-09-29 VITALS — BP 120/80 | Wt 236.0 lb

## 2017-09-29 DIAGNOSIS — O0990 Supervision of high risk pregnancy, unspecified, unspecified trimester: Secondary | ICD-10-CM

## 2017-09-29 DIAGNOSIS — Z3A38 38 weeks gestation of pregnancy: Secondary | ICD-10-CM

## 2017-09-29 NOTE — Progress Notes (Signed)
  Subjective  Fetal Movement? yes Contractions? yes Leaking Fluid? no Vaginal Bleeding? No BS normal per pt. DP reports no fetal growth concerns Plans CS 10/12/17 (AS)  Objective  BP 120/80   Wt 236 lb (107 kg)   LMP 12/28/2016 (Approximate)   BMI 46.09 kg/m  General: NAD Pumonary: no increased work of breathing Abdomen: gravid, non-tender Extremities: no edema Psychiatric: mood appropriate, affect full Cx Cl/50/-3, Vtx Assessment  26 y.o. [id number] at 1380w0d by  10/13/2017, by Ultrasound presenting for routine prenatal visit  Plan   Problem List Items Addressed This Visit      Other   Supervision of high risk pregnancy, antepartum        Prior CS, Obesity, Gest DM  Other Visit Diagnoses    [redacted] weeks gestation of pregnancy    -  Primary    A NST procedure was performed with FHR monitoring and a normal baseline established, appropriate time of 20-40 minutes of evaluation, and accels >2 seen w 15x15 characteristics.  Results show a REACTIVE NST.  Plan US for AFI and NST Friday  Annamarie MajorPaul Julyssa Kyer, MD, Merlinda FrederickFACOG [facility], [facility] 09/29/2017  3:25 PM

## 2017-10-01 ENCOUNTER — Ambulatory Visit (INDEPENDENT_AMBULATORY_CARE_PROVIDER_SITE_OTHER): Payer: Medicaid Other

## 2017-10-01 ENCOUNTER — Other Ambulatory Visit: Payer: Self-pay | Admitting: Advanced Practice Midwife

## 2017-10-01 ENCOUNTER — Encounter: Payer: Self-pay | Admitting: Advanced Practice Midwife

## 2017-10-01 ENCOUNTER — Ambulatory Visit (INDEPENDENT_AMBULATORY_CARE_PROVIDER_SITE_OTHER): Payer: Medicaid Other | Admitting: Advanced Practice Midwife

## 2017-10-01 VITALS — BP 108/70 | Wt 235.0 lb

## 2017-10-01 DIAGNOSIS — Z362 Encounter for other antenatal screening follow-up: Secondary | ICD-10-CM | POA: Diagnosis not present

## 2017-10-01 DIAGNOSIS — O0993 Supervision of high risk pregnancy, unspecified, third trimester: Secondary | ICD-10-CM

## 2017-10-01 DIAGNOSIS — Z3A38 38 weeks gestation of pregnancy: Secondary | ICD-10-CM

## 2017-10-01 DIAGNOSIS — O24119 Pre-existing diabetes mellitus, type 2, in pregnancy, unspecified trimester: Secondary | ICD-10-CM

## 2017-10-01 DIAGNOSIS — O0990 Supervision of high risk pregnancy, unspecified, unspecified trimester: Secondary | ICD-10-CM

## 2017-10-01 DIAGNOSIS — F319 Bipolar disorder, unspecified: Secondary | ICD-10-CM | POA: Insufficient documentation

## 2017-10-01 DIAGNOSIS — Z6841 Body Mass Index (BMI) 40.0 and over, adult: Secondary | ICD-10-CM

## 2017-10-01 NOTE — Progress Notes (Signed)
Routine Prenatal Care Visit  Subjective  Faith [name] is a 26 y.o. G5P1031 at 289w2d being seen today for ongoing prenatal care.  She is currently monitored for the following issues for this high-risk pregnancy and has Status epilepticus (HCC); Diabetes mellitus type 2 in obese (HCC); Depression; Suicidal ideation; Post traumatic stress disorder (PTSD); ADD (attention deficit disorder); Diabetes mellitus during pregnancy, antepartum; Cholecystitis, acute; Supervision of high risk pregnancy, antepartum; BMI 40.0-44.9, adult (HCC); History of anemia; History of maternal blood transfusion, currently pregnant; Asthma affecting pregnancy, antepartum; Smoking (tobacco) complicating pregnancy, unspecified trimester; Seizure disorder during pregnancy (HCC); Family history of Huntington's disease; Abdominal pain affecting pregnancy, antepartum; History of cesarean delivery; Indication for care in labor and delivery, antepartum; and Bipolar disorder (HCC) on their problem list.  ----------------------------------------------------------------------------------- Patient reports no complaints.  Questions answered regarding upcoming c/section. Patient did not bring BS log but states values are normal with fasting levels in the 80's and postprandial at 120. Contractions: Irregular. Vag. Bleeding: None.  Movement: Present. Denies leaking of fluid.  ----------------------------------------------------------------------------------- The following portions of the patient's history were reviewed and updated as appropriate: allergies, current medications, past family history, past medical history, past social history, past surgical history and problem list. Problem list updated.   Objective  Blood pressure 108/70, weight 235 lb (106.6 kg), last menstrual period 12/28/2016 Pregravid weight 220 lb (99.8 kg) Total Weight Gain 15 lb (6.804 kg) Urinalysis: Urine Protein: Negative Urine Glucose: Negative  Fetal Status:  Fetal Heart Rate (bpm): 140   Movement: Present  Presentation: Vertex  NST reactive 20 minute strip: 140 bpm baseline, moderate variability, +accelerations 15x15, -decelerations  General:  Alert, oriented and cooperative. Patient is in no acute distress.  Skin: Skin is warm and dry. No rash noted.   Cardiovascular: Normal heart rate noted  Respiratory: Normal respiratory effort, no problems with respiration noted  Abdomen: Soft, gravid, appropriate for gestational age. Pain/Pressure: Absent     Pelvic:  Cervical exam deferred        Extremities: Normal range of motion.     Mental Status: Normal mood and affect. Normal behavior. Normal judgment and thought content.   Assessment   26 y.o. [id number] at 839w2d by  10/13/2017, by Ultrasound presenting for routine prenatal visit  Plan   pregnancy 2 Problems (from 12/28/16 to present)    Problem Noted Resolved   History of cesarean delivery 07/12/2017 by Nadara MustardHarris, Robert P, MD No   Overview Signed 07/30/2017  9:59 AM by Farrel ConnersGutierrez, Colleen, CNM    Arrest of dilation at 7cm after 3 day IOL Baby 8#7oz Blood transfusion postpartum      Asthma affecting pregnancy, antepartum 05/10/2017 by Lady DeutscherJames, Andra, MD No   Smoking (tobacco) complicating pregnancy, unspecified trimester 05/10/2017 by Lady DeutscherJames, Andra, MD No   Seizure disorder during pregnancy [facility](HCC) 05/10/2017 by Lady DeutscherJames, Andra, MD No   History of anemia 04/19/2017 by Lady DeutscherJames, Andra, MD No   History of maternal blood transfusion, currently pregnant 04/19/2017 by Lady DeutscherJames, Andra, MD No   Supervision of high risk pregnancy, antepartum 03/02/2017 by Tresea MallGledhill, Lylla Eifler, CNM No   Overview Addendum 08/09/2017  3:11 PM by Natale MilchSchuman, Christanna R, MD    Clinic [facility] Prenatal Labs  Dating  Blood type:  O pos  Genetic Screen 1 Screen: negative Antibody:negative  Anatomic US  Rubella: Immune Varicella: Immune  GTT Early:               Third trimester:  RPR: NR  Rhogam  HBsAg: negative  TDaP vaccine                       Flu  Shot: HIV: negative  Baby Food                                GBS:   Contraception  Pap:  CBB   Basline P/C ratio 7/23 108mg /g  CS/VBAC Hx c/s x1   Support Person               BMI 40.0-44.9, adult (HCC) 03/02/2017 by Tresea MallGledhill, Louella Medaglia, CNM No   Diabetes mellitus during pregnancy, antepartum 01/16/2016 by Lady DeutscherJames, Andra, MD No   Overview Addendum 06/11/2017  4:45 PM by Vena AustriaStaebler, Andreas, MD    [ ]  currently on metformin [ ]  needs ophthomology appt, pt to arrange [X]  [facility] at 22 weeks [facility]08[facility]17 no further follow up recommended [ ]  Serial growth u/s, 3rd trimester [ ]  consider [facility] PN consult           Term labor symptoms and general obstetric precautions including but not limited to vaginal bleeding, contractions, leaking of fluid and fetal movement were reviewed in detail with the patient. Please refer to After Visit Summary for other counseling recommendations.   Return in about 3 days (around 10/04/2017) for nst and rob.  Tresea MallJane Irais Mottram, CNM  10/01/2017 3:30 PM

## 2017-10-04 ENCOUNTER — Ambulatory Visit (INDEPENDENT_AMBULATORY_CARE_PROVIDER_SITE_OTHER): Payer: Medicaid Other | Admitting: Obstetrics & Gynecology

## 2017-10-04 VITALS — BP 110/80 | Wt 235.0 lb

## 2017-10-04 DIAGNOSIS — J45909 Unspecified asthma, uncomplicated: Secondary | ICD-10-CM | POA: Diagnosis not present

## 2017-10-04 DIAGNOSIS — Z3A38 38 weeks gestation of pregnancy: Secondary | ICD-10-CM | POA: Diagnosis not present

## 2017-10-04 DIAGNOSIS — O24119 Pre-existing diabetes mellitus, type 2, in pregnancy, unspecified trimester: Secondary | ICD-10-CM

## 2017-10-04 DIAGNOSIS — O34219 Maternal care for unspecified type scar from previous cesarean delivery: Secondary | ICD-10-CM

## 2017-10-04 DIAGNOSIS — O0990 Supervision of high risk pregnancy, unspecified, unspecified trimester: Secondary | ICD-10-CM

## 2017-10-04 DIAGNOSIS — O99519 Diseases of the respiratory system complicating pregnancy, unspecified trimester: Secondary | ICD-10-CM

## 2017-10-04 DIAGNOSIS — O99513 Diseases of the respiratory system complicating pregnancy, third trimester: Secondary | ICD-10-CM | POA: Diagnosis not present

## 2017-10-04 DIAGNOSIS — O24113 Pre-existing diabetes mellitus, type 2, in pregnancy, third trimester: Secondary | ICD-10-CM | POA: Diagnosis not present

## 2017-10-04 DIAGNOSIS — Z98891 History of uterine scar from previous surgery: Secondary | ICD-10-CM

## 2017-10-04 NOTE — Progress Notes (Signed)
  Subjective  Fetal Movement? yes Contractions? irreg Leaking Fluid? no Vaginal Bleeding? no  Objective  BP 110/80   Wt 235 lb (106.6 kg)   LMP 12/28/2016 (Approximate)   BMI 45.90 kg/m  General: NAD Pumonary: no increased work of breathing Abdomen: gravid, non-tender Extremities: no edema Psychiatric: mood appropriate, affect full  Assessment  26 y.o. [id number] at 6330w5d by  10/13/2017, by Ultrasound presenting for routine prenatal visit  Plan   Problem List Items Addressed This Visit      Respiratory   Asthma affecting pregnancy, antepartum     Endocrine   Diabetes mellitus during pregnancy, antepartum   Relevant Orders   US OB Follow Up     Other   Supervision of high risk pregnancy, antepartum   History of cesarean delivery    Other Visit Diagnoses    [redacted] weeks gestation of pregnancy    -  Primary    GDM- BS under control per pt.  FBS 81 today.    FWR    A NST procedure was performed with FHR monitoring and a normal baseline established, appropriate time of 20-40 minutes of evaluation, and accels >2 seen w 15x15 characteristics.  Results show a REACTIVE NST.   Obesity- ASA, monitoring  CS- plan R CS on Jan 8 or before if labors.  BC- Plans Depo  Annamarie MajorPaul Gerald Honea, MD, Merlinda FrederickFACOG [facility], [facility] 10/04/2017  3:08 PM

## 2017-10-07 ENCOUNTER — Ambulatory Visit (INDEPENDENT_AMBULATORY_CARE_PROVIDER_SITE_OTHER): Payer: Medicaid Other

## 2017-10-07 ENCOUNTER — Ambulatory Visit (INDEPENDENT_AMBULATORY_CARE_PROVIDER_SITE_OTHER): Payer: Medicaid Other | Admitting: Obstetrics & Gynecology

## 2017-10-07 VITALS — BP 110/72 | Wt 234.0 lb

## 2017-10-07 DIAGNOSIS — O99519 Diseases of the respiratory system complicating pregnancy, unspecified trimester: Secondary | ICD-10-CM

## 2017-10-07 DIAGNOSIS — O24119 Pre-existing diabetes mellitus, type 2, in pregnancy, unspecified trimester: Secondary | ICD-10-CM

## 2017-10-07 DIAGNOSIS — J45909 Unspecified asthma, uncomplicated: Secondary | ICD-10-CM

## 2017-10-07 DIAGNOSIS — Z3A39 39 weeks gestation of pregnancy: Secondary | ICD-10-CM | POA: Diagnosis not present

## 2017-10-07 DIAGNOSIS — Z98891 History of uterine scar from previous surgery: Secondary | ICD-10-CM

## 2017-10-07 NOTE — Progress Notes (Signed)
  Subjective  Fetal Movement? yes Contractions? irreg lower pains, rare Leaking Fluid? no Vaginal Bleeding? No   Objective  BP 110/72   Wt 234 lb (106.1 kg)   LMP 12/28/2016 (Approximate)   BMI 45.70 kg/m  General: NAD Pumonary: no increased work of breathing Abdomen: gravid, non-tender Extremities: no edema Psychiatric: mood appropriate, affect full  Assessment  27 y.o. [id number] at 1286w1d by  10/13/2017, by Ultrasound presenting for routine prenatal visit  Plan   Problem List Items Addressed This Visit      Respiratory   Asthma affecting pregnancy, antepartum     Endocrine   Diabetes mellitus during pregnancy, antepartum     Other   History of cesarean delivery    Other Visit Diagnoses    [redacted] weeks gestation of pregnancy    -  Primary    Review of ULTRASOUND.    I have personally reviewed images and [facility]    Plan of management to be discussed with patient. A NST procedure was performed with FHR monitoring and a normal baseline established, appropriate time of 20-40 minutes of evaluation, and accels >2 seen w 15x15 characteristics.  Results show a REACTIVE NST.  US discussed; AFI 14; Vtx Plan CS next week; sooner if labor  Annamarie MajorPaul Sunset Joshi, MD, Merlinda FrederickFACOG [facility], [facility] 10/07/2017  3:20 PM

## 2017-10-07 NOTE — Patient Instructions (Signed)

## 2017-10-09 ENCOUNTER — Encounter: Payer: Self-pay | Admitting: *Deleted

## 2017-10-09 ENCOUNTER — Observation Stay
Admission: EM | Admit: 2017-10-09 | Discharge: 2017-10-09 | Disposition: A | Discharge status: Home / Self Care | Payer: Medicaid Other | Attending: Obstetrics and Gynecology | Admitting: Obstetrics and Gynecology

## 2017-10-09 ENCOUNTER — Other Ambulatory Visit: Payer: Self-pay

## 2017-10-09 DIAGNOSIS — Z91040 Latex allergy status: Secondary | ICD-10-CM | POA: Insufficient documentation

## 2017-10-09 DIAGNOSIS — Z8489 Family history of other specified conditions: Secondary | ICD-10-CM | POA: Diagnosis not present

## 2017-10-09 DIAGNOSIS — Z98891 History of uterine scar from previous surgery: Secondary | ICD-10-CM

## 2017-10-09 DIAGNOSIS — Z882 Allergy status to sulfonamides status: Secondary | ICD-10-CM | POA: Diagnosis not present

## 2017-10-09 DIAGNOSIS — O479 False labor, unspecified: Secondary | ICD-10-CM | POA: Diagnosis not present

## 2017-10-09 DIAGNOSIS — Z8249 Family history of ischemic heart disease and other diseases of the circulatory system: Secondary | ICD-10-CM | POA: Diagnosis not present

## 2017-10-09 DIAGNOSIS — F419 Anxiety disorder, unspecified: Secondary | ICD-10-CM | POA: Insufficient documentation

## 2017-10-09 DIAGNOSIS — Z79899 Other long term (current) drug therapy: Secondary | ICD-10-CM | POA: Insufficient documentation

## 2017-10-09 DIAGNOSIS — Z91041 Radiographic dye allergy status: Secondary | ICD-10-CM | POA: Insufficient documentation

## 2017-10-09 DIAGNOSIS — F329 Major depressive disorder, single episode, unspecified: Secondary | ICD-10-CM | POA: Diagnosis not present

## 2017-10-09 DIAGNOSIS — Z888 Allergy status to other drugs, medicaments and biological substances status: Secondary | ICD-10-CM | POA: Insufficient documentation

## 2017-10-09 DIAGNOSIS — G43909 Migraine, unspecified, not intractable, without status migrainosus: Secondary | ICD-10-CM | POA: Insufficient documentation

## 2017-10-09 DIAGNOSIS — O99340 Other mental disorders complicating pregnancy, unspecified trimester: Secondary | ICD-10-CM | POA: Insufficient documentation

## 2017-10-09 DIAGNOSIS — E119 Type 2 diabetes mellitus without complications: Secondary | ICD-10-CM | POA: Diagnosis not present

## 2017-10-09 DIAGNOSIS — K219 Gastro-esophageal reflux disease without esophagitis: Secondary | ICD-10-CM | POA: Diagnosis not present

## 2017-10-09 DIAGNOSIS — O24319 Unspecified pre-existing diabetes mellitus in pregnancy, unspecified trimester: Secondary | ICD-10-CM | POA: Insufficient documentation

## 2017-10-09 DIAGNOSIS — O99519 Diseases of the respiratory system complicating pregnancy, unspecified trimester: Secondary | ICD-10-CM | POA: Diagnosis not present

## 2017-10-09 DIAGNOSIS — Z3A00 Weeks of gestation of pregnancy not specified: Secondary | ICD-10-CM

## 2017-10-09 DIAGNOSIS — Z9049 Acquired absence of other specified parts of digestive tract: Secondary | ICD-10-CM | POA: Diagnosis not present

## 2017-10-09 DIAGNOSIS — N858 Other specified noninflammatory disorders of uterus: Secondary | ICD-10-CM | POA: Diagnosis present

## 2017-10-09 DIAGNOSIS — O24119 Pre-existing diabetes mellitus, type 2, in pregnancy, unspecified trimester: Secondary | ICD-10-CM

## 2017-10-09 DIAGNOSIS — F909 Attention-deficit hyperactivity disorder, unspecified type: Secondary | ICD-10-CM | POA: Insufficient documentation

## 2017-10-09 DIAGNOSIS — Z7984 Long term (current) use of oral hypoglycemic drugs: Secondary | ICD-10-CM | POA: Diagnosis not present

## 2017-10-09 DIAGNOSIS — Z886 Allergy status to analgesic agent status: Secondary | ICD-10-CM | POA: Diagnosis not present

## 2017-10-09 DIAGNOSIS — G40909 Epilepsy, unspecified, not intractable, without status epilepticus: Secondary | ICD-10-CM

## 2017-10-09 DIAGNOSIS — Z862 Personal history of diseases of the blood and blood-forming organs and certain disorders involving the immune mechanism: Secondary | ICD-10-CM

## 2017-10-09 DIAGNOSIS — O09299 Supervision of pregnancy with other poor reproductive or obstetric history, unspecified trimester: Secondary | ICD-10-CM

## 2017-10-09 DIAGNOSIS — Z91018 Allergy to other foods: Secondary | ICD-10-CM | POA: Insufficient documentation

## 2017-10-09 DIAGNOSIS — Z9102 Food additives allergy status: Secondary | ICD-10-CM | POA: Diagnosis not present

## 2017-10-09 DIAGNOSIS — J45909 Unspecified asthma, uncomplicated: Secondary | ICD-10-CM | POA: Diagnosis not present

## 2017-10-09 DIAGNOSIS — O0990 Supervision of high risk pregnancy, unspecified, unspecified trimester: Secondary | ICD-10-CM

## 2017-10-09 DIAGNOSIS — O99353 Diseases of the nervous system complicating pregnancy, third trimester: Secondary | ICD-10-CM

## 2017-10-09 DIAGNOSIS — F17210 Nicotine dependence, cigarettes, uncomplicated: Secondary | ICD-10-CM | POA: Insufficient documentation

## 2017-10-09 DIAGNOSIS — Z885 Allergy status to narcotic agent status: Secondary | ICD-10-CM | POA: Diagnosis not present

## 2017-10-09 DIAGNOSIS — O99330 Smoking (tobacco) complicating pregnancy, unspecified trimester: Secondary | ICD-10-CM

## 2017-10-09 DIAGNOSIS — Z6841 Body Mass Index (BMI) 40.0 and over, adult: Secondary | ICD-10-CM

## 2017-10-09 LAB — GLUCOSE, CAPILLARY: GLUCOSE-CAPILLARY: 86 mg/dL (ref 65–99)

## 2017-10-09 MED ORDER — LACTATED RINGERS IV BOLUS (SEPSIS)
1000.0000 mL | Freq: Once | INTRAVENOUS | Status: AC
  Administered 2017-10-09: 1000 mL via INTRAVENOUS

## 2017-10-09 NOTE — OB Triage Note (Signed)
Ctx since 2200 last night. C/O "coochie" hurting. Pain 8/10 in abdomen and vagina. Faith HoopsElks, Shanay Woolman S

## 2017-10-09 NOTE — Discharge Summary (Signed)
Physician Discharge Summary   Patient ID: Faith [name] [id number] 27 y.o. [birth year]  Admit date: 10/09/2017  Discharge date and time: No discharge date for patient encounter.   Admitting Physician: Homero Fellers, MD   Discharge Physician: Adrian Prows MD  Admission Diagnoses: No admission diagnoses are documented for this encounter.  Discharge Diagnoses: Braxton-Hicks   Admission Condition: good  Discharged Condition: good  Indication for Admission: Triage visit  Hospital Course: Patient given IV fluids, no cervical change. Patient felt better. Abdominal pain resolved. Requested discharge home.  Consults: None  Significant Diagnostic Studies: none  Treatments: IV hydration  Discharge Exam: see note  Disposition: 01-Home or Self Care  Patient Instructions:  Allergies as of 10/09/2017      Reactions   Ivp Dye [iodinated Diagnostic Agents] Anaphylaxis   Metrizamide Anaphylaxis   Morphine Other (See Comments)   bradycradia   Sulfasalazine Hives   Tomato Anaphylaxis   Toradol [ketorolac Tromethamine] Anaphylaxis, Hives, Swelling   Dilaudid [hydromorphone Hcl] Hives   Latex Rash   Sulfa Antibiotics Hives   Tramadol Swelling, Rash, Hives   Aspirin Hives   Hydrocodone-acetaminophen Other (See Comments)   Iohexol Other (See Comments)   Pt. States she can not take iohexol but won't state reaction type.   Lamictal [lamotrigine] Hives   Mushroom Extract Complex Rash      Medication List    TAKE these medications   ACCU-CHEK FASTCLIX LANCETS Misc 1 Units by Percutaneous route 4 (four) times daily.   ACCU-CHEK NANO SMARTVIEW w/Device Kit 1 kit by Subdermal route as directed. Check blood sugars for fasting, and two hours after breakfast, lunch and dinner (4 checks daily)   acetaminophen 500 MG tablet Commonly known as:  TYLENOL Take 500 mg by mouth every 6 (six) hours as needed for moderate pain or headache.   albuterol 108 (90 Base) MCG/ACT  inhaler Commonly known as:  PROVENTIL HFA;VENTOLIN HFA Inhale 2 puffs into the lungs every 6 (six) hours as needed. For shortness of breath   amphetamine-dextroamphetamine 10 MG 24 hr capsule Commonly known as:  ADDERALL XR Take 10 mg by mouth daily.   blood glucose meter kit and supplies Kit Dispense based on patient and insurance preference. Use up to four times daily as directed. (FOR ICD-9 250.00, 250.01).   clonazePAM 1 MG tablet Commonly known as:  KLONOPIN Take 1 mg by mouth 2 (two) times daily.   clotrimazole 1 % cream Commonly known as:  LOTRIMIN Apply 1 application topically daily as needed (boils).   ferrous sulfate 325 (65 FE) MG tablet Take 325 mg by mouth 2 (two) times daily.   FLUoxetine 20 MG tablet Commonly known as:  PROZAC Take 20 mg by mouth daily.   glucose blood test strip Commonly known as:  ACCU-CHEK SMARTVIEW Use as instructed to check blood sugars   glyBURIDE 2.5 MG tablet Commonly known as:  DIABETA Take 1 tablet (2.5 mg total) by mouth 2 (two) times daily with a meal. What changed:  when to take this   metFORMIN 500 MG tablet Commonly known as:  GLUCOPHAGE Take 500 mg by mouth daily.   ranitidine 150 MG tablet Commonly known as:  ZANTAC Take 1 tablet (150 mg total) by mouth at bedtime.      Activity: activity as tolerated Diet: regular diet Wound Care: none needed  Follow-up with Dr. Georgianne Fick in 2 day.  Signed: Homero Fellers 10/09/2017 6:26 PM

## 2017-10-09 NOTE — Progress Notes (Signed)
CC: Contractions  Faith [name] is an 27 y.o. female.  HPI: Patient presents to labor and delivery complaining of occasional uterine contractions since yesterday. She has nto eaten any food today. She has type two diabetes. She reports that she last checked her blood sugar 2 hours after dinner. She says that it was 118. She has been taking glyburide for her diabetes. She reports positive fetal movement. Denies vaginal bleeding, denies abnormal vaginal discharge. Denies fevers. Denies dysuria.   Past Medical History:  Diagnosis Date  . ADHD   . Anemia    BLOOD TRANSFUSION AFTER C-SECTION ON 06-2016-2 UNITS  . Anxiety   . Asthma    WELL CONTROLLED  . Bipolar 1 disorder, depressed (HCC)   . Depression   . Diabetes mellitus without complication (HCC)   . Gallstones   . GERD (gastroesophageal reflux disease)   . Migraines   . Ovarian cyst   . Seizures (HCC)    LAST SEIZURE 2016  . Tubal pregnancy     Past Surgical History:  Procedure Laterality Date  . CESAREAN SECTION N/A 06/22/2016   Procedure: CESAREAN SECTION;  Surgeon: Vena Austria, MD;  Location: [facility] ORS;  Service: Obstetrics;  Laterality: N/A;  . CHOLECYSTECTOMY N/A 08/13/2016   Procedure: LAPAROSCOPIC CHOLECYSTECTOMY;  Surgeon: Leafy Ro, MD;  Location: [facility] ORS;  Service: General;  Laterality: N/A;  . DILATION AND CURETTAGE OF UTERUS    . ovarian cyst removed    . SALPINGECTOMY Right 2013   ectopic pregnancy  . TONSILLECTOMY    . TONSILLECTOMY AND ADENOIDECTOMY      Family History  Problem Relation Age of Onset  . Heart disease Father   . Huntington's disease Mother     Social History:  reports that she has been smoking cigarettes.  She has a 1.50 pack-year smoking history. she has never used smokeless tobacco. She reports that she does not drink alcohol or use drugs.  Allergies:  Allergies  Allergen Reactions  . Ivp Dye [Iodinated Diagnostic Agents] Anaphylaxis  . Metrizamide Anaphylaxis  . Morphine  Other (See Comments)    bradycradia   . Sulfasalazine Hives  . Tomato Anaphylaxis  . Toradol [Ketorolac Tromethamine] Anaphylaxis, Hives and Swelling  . Dilaudid [Hydromorphone Hcl] Hives  . Latex Rash  . Sulfa Antibiotics Hives  . Tramadol Swelling, Rash and Hives  . Aspirin Hives  . Hydrocodone-Acetaminophen Other (See Comments)  . Iohexol Other (See Comments)    Pt. States she can not take iohexol but won't state reaction type.  . Lamictal [Lamotrigine] Hives  . Mushroom Extract Complex Rash    Medications: I have reviewed the patient's current medications.  Results for orders placed or performed during the hospital encounter of 10/09/17 (from the past 48 hour(s))  Glucose, capillary     Status: None   Collection Time: 10/09/17  4:45 PM  Result Value Ref Range   Glucose-Capillary 86 65 - 99 mg/dL    No results found.  Review of Systems  Constitutional: Negative for chills, fever, malaise/fatigue and weight loss.  HENT: Negative for congestion, hearing loss and sinus pain.   Eyes: Negative for blurred vision and double vision.  Respiratory: Negative for cough, sputum production, shortness of breath and wheezing.   Cardiovascular: Negative for chest pain, palpitations, orthopnea and leg swelling.  Gastrointestinal: Negative for abdominal pain, constipation, diarrhea, nausea and vomiting.  Genitourinary: Negative for dysuria, flank pain, frequency, hematuria and urgency.  Musculoskeletal: Negative for back pain, falls and joint pain.  Skin: Negative for itching and rash.  Neurological: Negative for dizziness and headaches.  Psychiatric/Behavioral: Negative for depression, substance abuse and suicidal ideas. The patient is not nervous/anxious.    Height 5' (1.524 m), weight 234 lb (106.1 kg), last menstrual period 12/28/2016, not currently breastfeeding. Physical Exam  Nursing note and vitals reviewed. Constitutional: She is oriented to person, place, and time. She appears  well-developed and well-nourished.  HENT:  Head: Normocephalic and atraumatic.  Cardiovascular: Normal rate and regular rhythm.  Respiratory: Effort normal and breath sounds normal.  GI: Soft. Bowel sounds are normal.  Musculoskeletal: Normal range of motion.  Neurological: She is alert and oriented to person, place, and time.  Skin: Skin is warm and dry.  Psychiatric: She has a normal mood and affect. Her behavior is normal. Judgment and thought content normal.   SVE unchanged NST: 120s. Moderate variability, no decelerations 15x15 accelerations Toco: irregular.  Assessment/Plan: 16XW [age] G5P1031 at 39 3/7 1. No cervical change on exam. No signs of labor. IV fluid hydration given. Labor precautions given. Patient requesting discharge to go for dinner. Follow up next week for cesarean section.   Daryle Amis R Sair Faulcon 10/09/2017, 6:30 PM

## 2017-10-09 NOTE — Discharge Instructions (Signed)
Please keep your follow up appointment as scheduled.  If you have any questions or concerns please call the on call provider.  You may also [facility] at [phone].  If you have urgent concerns you should go straight to the Emergency Department for evaluation.

## 2017-10-11 ENCOUNTER — Encounter
Admission: RE | Admit: 2017-10-11 | Discharge: 2017-10-11 | Disposition: A | Discharge status: Home / Self Care | Payer: Medicaid Other | Source: Ambulatory Visit | Attending: Obstetrics and Gynecology | Admitting: Obstetrics and Gynecology

## 2017-10-11 ENCOUNTER — Other Ambulatory Visit: Payer: Self-pay

## 2017-10-11 ENCOUNTER — Encounter: Payer: Self-pay | Admitting: Obstetrics and Gynecology

## 2017-10-11 ENCOUNTER — Ambulatory Visit (INDEPENDENT_AMBULATORY_CARE_PROVIDER_SITE_OTHER): Payer: Medicaid Other | Admitting: Obstetrics and Gynecology

## 2017-10-11 VITALS — BP 104/58 | HR 108 | Ht 60.0 in | Wt 236.0 lb

## 2017-10-11 DIAGNOSIS — F32A Depression, unspecified: Secondary | ICD-10-CM

## 2017-10-11 DIAGNOSIS — F329 Major depressive disorder, single episode, unspecified: Secondary | ICD-10-CM

## 2017-10-11 DIAGNOSIS — O0990 Supervision of high risk pregnancy, unspecified, unspecified trimester: Secondary | ICD-10-CM

## 2017-10-11 DIAGNOSIS — O24119 Pre-existing diabetes mellitus, type 2, in pregnancy, unspecified trimester: Secondary | ICD-10-CM

## 2017-10-11 DIAGNOSIS — O99519 Diseases of the respiratory system complicating pregnancy, unspecified trimester: Secondary | ICD-10-CM

## 2017-10-11 DIAGNOSIS — Z98891 History of uterine scar from previous surgery: Secondary | ICD-10-CM

## 2017-10-11 DIAGNOSIS — J45909 Unspecified asthma, uncomplicated: Secondary | ICD-10-CM

## 2017-10-11 DIAGNOSIS — O99343 Other mental disorders complicating pregnancy, third trimester: Secondary | ICD-10-CM

## 2017-10-11 DIAGNOSIS — F4310 Post-traumatic stress disorder, unspecified: Secondary | ICD-10-CM

## 2017-10-11 DIAGNOSIS — Z3A39 39 weeks gestation of pregnancy: Secondary | ICD-10-CM

## 2017-10-11 HISTORY — DX: Personal history of other medical treatment: Z92.89

## 2017-10-11 LAB — CBC
HEMATOCRIT: 34.5 % — AB (ref 35.0–47.0)
HEMOGLOBIN: 10.8 g/dL — AB (ref 12.0–16.0)
MCH: 21.8 pg — AB (ref 26.0–34.0)
MCHC: 31.2 g/dL — AB (ref 32.0–36.0)
MCV: 69.8 fL — AB (ref 80.0–100.0)
Platelets: 293 10*3/uL (ref 150–440)
RBC: 4.94 MIL/uL (ref 3.80–5.20)
RDW: 17.7 % — ABNORMAL HIGH (ref 11.5–14.5)
WBC: 10.5 10*3/uL (ref 3.6–11.0)

## 2017-10-11 LAB — TYPE AND SCREEN
ABO/RH(D): O POS
ANTIBODY SCREEN: NEGATIVE
Extend sample reason: UNDETERMINED

## 2017-10-11 LAB — RAPID HIV SCREEN (HIV 1/2 AB+AG)
HIV 1/2 Antibodies: NONREACTIVE
HIV-1 P24 ANTIGEN - HIV24: NONREACTIVE

## 2017-10-11 NOTE — Progress Notes (Signed)
Obstetric H&P   Chief Complaint: Repeat C-section  Prenatal Care Provider: [facility]  History of Present Illness: 27 y.o. [id number] 57w5dby 10/13/2017, by Ultrasound presenting to L&D for repeat cesarean section.  +FM, no LOF, no VB.  History of prior C-section for active phase arrest.  Pregnancy also complicated by obesity with appropriate weight gain of 14lbs.  Pregravid weight 220 lb (99.8 kg) Total Weight Gain 14 lb (6.35 kg)  pregnancy 2 Problems (from 12/28/16 to present)    Problem Noted Resolved   History of cesarean delivery 07/12/2017 by HGae Dry MD No   Overview Signed 07/30/2017  9:59 AM by GDalia Heading CNM    Arrest of dilation at 7cm after 3 day IOL Baby 8#7oz Blood transfusion postpartum      Asthma affecting pregnancy, antepartum 05/10/2017 by JDellia Nims MD No   Smoking (tobacco) complicating pregnancy, unspecified trimester 05/10/2017 by JDellia Nims MD No   Seizure disorder during pregnancy ([facility] 05/10/2017 by JDellia Nims MD No   History of anemia 04/19/2017 by JDellia Nims MD No   History of maternal blood transfusion, currently pregnant 04/19/2017 by JDellia Nims MD No   Supervision of high risk pregnancy, antepartum 03/02/2017 by GRod Can CNM No   Overview Addendum 08/09/2017  3:11 PM by SHomero Fellers MD    Clinic [facility] Prenatal Labs  Dating  Blood type:  O pos  Genetic Screen 1 Screen: negative Antibody:negative  Anatomic [place] Rubella: Immune Varicella: Immune  GTT Early:               Third trimester:  RPR: NR  Rhogam  HBsAg: negative  TDaP vaccine                       Flu Shot: HIV: negative  Baby Food                                GBS:   Contraception  Pap:  CBB   Basline P/C ratio 7/23 1084mg  CS/VBAC    Support Person               BMI 40.0-44.9, adult ([facility]5/29/2018 by GlRod CanCNM No   Diabetes mellitus during pregnancy, antepartum 01/16/2016 by JaDellia NimsMD No   Overview Addendum 06/11/2017  4:45 PM by  StMalachy MoodMD    _0  currently on metformin _1  needs ophthomology appt, pt to arrange _2  [facility] weeks [facility] no further follow up recommended _3  Serial growth u/s, 3rd trimester _4  consider [facility] PN consult           Review of Systems: 10 point review of systems negative unless otherwise noted in HPI  Past Medical History: Past Medical History:  Diagnosis Date  . ADHD   . Anemia    BLOOD TRANSFUSION AFTER C-SECTION ON 06-2016-2 UNITS  . Anxiety   . Asthma    WELL CONTROLLED  . Bipolar 1 disorder, depressed ([place]  . Depression   . Diabetes mellitus without complication ([place]  . Gallstones   . GERD (gastroesophageal reflux disease)   . Migraines   . Ovarian cyst   . Seizures ([place]   LAST SEIZURE 2016  . Tubal pregnancy     Past Surgical History: Past Surgical History:  Procedure Laterality Date  . CESAREAN SECTION N/A 06/22/2016  Procedure: CESAREAN SECTION;  Surgeon: Malachy Mood, MD;  Location: [facility] ORS;  Service: Obstetrics;  Laterality: N/A;  . CHOLECYSTECTOMY N/A 08/13/2016   Procedure: LAPAROSCOPIC CHOLECYSTECTOMY;  Surgeon: Jules Husbands, MD;  Location: [facility] ORS;  Service: General;  Laterality: N/A;  . DILATION AND CURETTAGE OF UTERUS    . ovarian cyst removed    . SALPINGECTOMY Right 2013   ectopic pregnancy  . TONSILLECTOMY    . TONSILLECTOMY AND ADENOIDECTOMY      Family History: Family History  Problem Relation Age of Onset  . Heart disease Father   . Huntington's disease Mother     Social History: Social History   Socioeconomic History  . Marital status: Single    Spouse name: Not on file  . Number of children: Not on file  . Years of education: Not on file  . Highest education level: Not on file  Social Needs  . Financial resource strain: Not on file  . Food insecurity - worry: Not on file  . Food insecurity - inability: Not on file  . Transportation needs - medical: Not on file  .  Transportation needs - non-medical: Not on file  Occupational History  . Not on file  Tobacco Use  . Smoking status: Current Every Day Smoker    Packs/day: 0.50    Years: 3.00    Pack years: 1.50    Types: Cigarettes  . Smokeless tobacco: Never Used  Substance and Sexual Activity  . Alcohol use: No    Alcohol/week: 0.0 oz  . Drug use: No  . Sexual activity: Yes    Birth control/protection: None, Injection    Comment: plans for depo use  Other Topics Concern  . Not on file  Social History Narrative  . Not on file    Medications: Prior to Admission medications   Medication Sig Start Date End Date Taking? Authorizing Provider  ACCU-CHEK FASTCLIX LANCETS MISC 1 Units by Percutaneous route 4 (four) times daily. 05/24/17  Yes Malachy Mood, MD  acetaminophen (TYLENOL) 500 MG tablet Take 500 mg by mouth every 6 (six) hours as needed for moderate pain or headache.   Yes [provider]  albuterol (PROVENTIL HFA;VENTOLIN HFA) 108 (90 BASE) MCG/ACT inhaler Inhale 2 puffs into the lungs every 6 (six) hours as needed. For shortness of breath   Yes [provider]  amphetamine-dextroamphetamine (ADDERALL XR) 10 MG 24 hr capsule Take 10 mg by mouth daily.    Yes [provider]  blood glucose meter kit and supplies KIT Dispense based on patient and insurance preference. Use up to four times daily as directed. (FOR ICD-9 250.00, 250.01). 06/18/15  Yes Ahmed Prima, MD  Blood Glucose Monitoring Suppl (ACCU-CHEK NANO SMARTVIEW) w/Device KIT 1 kit by Subdermal route as directed. Check blood sugars for fasting, and two hours after breakfast, lunch and dinner (4 checks daily) 05/24/17  Yes Malachy Mood, MD  clonazePAM (KLONOPIN) 1 MG tablet Take 1 mg by mouth 2 (two) times daily.   Yes [provider]  clotrimazole (LOTRIMIN) 1 % cream Apply 1 application topically daily as needed (boils).   Yes [provider]  ferrous sulfate 325 (65 FE) MG  tablet Take 325 mg by mouth 2 (two) times daily.   Yes [provider]  FLUoxetine (PROZAC) 20 MG tablet Take 20 mg by mouth daily.    Yes [provider]  glucose blood (ACCU-CHEK SMARTVIEW) test strip Use as instructed to check blood sugars 05/24/17  Yes Malachy Mood, MD  glyBURIDE (DIABETA) 2.5 MG tablet Take 1 tablet (2.5 mg total) by mouth 2 (two) times daily with a meal. Patient taking differently: Take 2.5 mg by mouth daily.  04/09/17  Yes Gae Dry, MD  metFORMIN (GLUCOPHAGE) 500 MG tablet Take 500 mg by mouth daily.   Yes [provider]  ranitidine (ZANTAC) 150 MG tablet Take 1 tablet (150 mg total) by mouth at bedtime. 06/03/17  Yes Will Bonnet, MD    Allergies: Allergies  Allergen Reactions  . Ivp Dye [Iodinated Diagnostic Agents] Anaphylaxis  . Metrizamide Anaphylaxis  . Morphine Other (See Comments)    bradycradia   . Sulfasalazine Hives  . Tomato Anaphylaxis  . Toradol [Ketorolac Tromethamine] Anaphylaxis, Hives and Swelling  . Dilaudid [Hydromorphone Hcl] Hives  . Latex Rash  . Sulfa Antibiotics Hives  . Tramadol Swelling, Rash and Hives  . Aspirin Hives  . Hydrocodone-Acetaminophen Other (See Comments)  . Iohexol Other (See Comments)    Pt. States she can not take iohexol but won't state reaction type.  . Lamictal [Lamotrigine] Hives  . Mushroom Extract Complex Rash    Physical Exam: Vitals: Blood pressure (!) 104/58, pulse (!) 108, height 5' (1.524 m), weight 236 lb (107 kg), last menstrual period 12/28/2016, not currently breastfeeding.  FHT: 140  General: NAD HEENT: normocephalic, anicteric Pulmonary: No increased work of breathing Cardiovascular: RRR, distal pulses 2+ Abdomen: Gravid, non-tender Genitourinary: deferred Extremities: no edema, erythema, or tenderness Neurologic: Grossly intact Psychiatric: mood appropriate, affect full  Labs: No results found for this or any previous visit (from the past 24  hour(s)).  Assessment: 27 y.o. [id number] 95w5dby 10/13/2017, by Ultrasound presenting for repeat scheduled cesarean section 10/12/17  Plan: 1) The patient was counseled regarding risk and benefits to proceeding with Cesarean section to expedite delivery.  Risk of cesarean section were discussed including risk of bleeding and need for potential intraoperative or postoperative blood transfusion with a rate of approximately 5% quoted for all Cesarean sections, risk of injury to adjacent organs including but not limited to bowl and bladder, the need for additional surgical procedures to address such injuries, and the risk of infection.    2) Fetus - 121FHT today  3) PNL - HIV not on chart to be drawn with preop labs  4) Immunization History -  Immunization History  Administered Date(s) Administered  . Influenza,inj,Quad PF,6+ Mos 08/09/2017  . Tdap 08/09/2017    5) Disposition - pending delivery

## 2017-10-11 NOTE — Patient Instructions (Signed)
Your procedure is scheduled on: October 12, 2017  Report to EMERGENCY ROOM  SURGERY IS SCHEDULED FOR 8:30 AM.  ARRIVE IN THE EMERGENCY ROOM AT 6:30AM  Remember: Instructions that are not followed completely may result in serious medical risk, up to and including death, or upon the discretion of your surgeon and anesthesiologist your surgery may need to be rescheduled.     _X__ 1. Do not eat food after midnight the night before your procedure.                 No gum chewing or hard candies. You may drink clear liquids up to 2 hours                 before you are scheduled to arrive for your surgery- DO not drink clear                 liquids within 2 hours of the start of your surgery.                 Clear Liquids include:  water    _X__ 2.  No Alcohol for 24 hours before or after surgery.   _X__ 3.  Do Not Smoke or use e-cigarettes For 24 Hours Prior to Your Surgery.                 Do not use any chewable tobacco products for at least 6 hours prior to                 surgery.  ____  4.  Bring all medications with you on the day of surgery if instructed.   ____  5.  Notify your doctor if there is any change in your medical condition      (cold, fever, infections).     Do not wear jewelry, make-up, hairpins, clips or nail polish. Do not wear lotions, powders, or perfumes. You may wear deodorant. Do not shave 48 hours prior to surgery. Men may shave face and neck. Do not bring valuables to the hospital.    [facility] is not responsible for any belongings or valuables.  Contacts, dentures or bridgework may not be worn into surgery. Leave your suitcase in the car. After surgery it may be brought to your room. For patients admitted to the hospital, discharge time is determined by your treatment team.   Patients discharged the day of surgery will not be allowed to drive home.   Please read over the following fact sheets that you were given:   PREPARING FOR  SURGERY   ____ Take these medicines the morning of surgery with A SIP OF WATER:    1. KLONOPIN  2. ZANTAC(OMEPRAZOLE)  3. ALBUTEROL INHALER  4. TYLENOL, ONLY IF NEEDED  5.  6.  ____ Fleet Enema (as directed)   __X__ Use CHG Soap as directed.  DO NOT USE ABOVE NECK, ON YOUR PRIVATE PARTS OR ON YOUR NIPPLES.  __X__ Use inhalers on the day of surgery.  USE YOUR INHALER IN THE MORNING.  __X__ Stop metformin/GLYBURIDE AS OF TODAY. DO NOT TAKE ON THE MORNING OF SURGERY.  ____ Take 1/2 of usual insulin dose the night before surgery. No insulin the morning          of surgery.   ____ Stop Coumadin/Plavix/ASPIRIN AS OF TODAY  ____ Stop Anti-inflammatories AS OF TODAY.  THIS INCLUDES IBUPROFEN / MOTRIN / ADVIL / ALEVE / GOODIES POWDERS.  TAKE YOUR ZANTAC TONIGHT  AS WELL AS IN THE MORNING.   ___X_ Stop supplements until after surgery.  DO NOT TAKE YOUR IRON TABLETS  ____ Bring C-Pap to the hospital.   DO NOT TAKE ADDERAL OR GLYBURIDE IN THE MORNING.

## 2017-10-12 ENCOUNTER — Inpatient Hospital Stay: Payer: Medicaid Other | Admitting: Certified Registered Nurse Anesthetist

## 2017-10-12 ENCOUNTER — Encounter
Admission: RE | Disposition: A | Discharge status: Home / Self Care | Payer: Self-pay | Source: Ambulatory Visit | Attending: Obstetrics and Gynecology

## 2017-10-12 ENCOUNTER — Inpatient Hospital Stay
Admission: RE | Admit: 2017-10-12 | Discharge: 2017-10-14 | DRG: 786 | Disposition: A | Discharge status: Home / Self Care | Payer: Medicaid Other | Source: Ambulatory Visit | Attending: Obstetrics and Gynecology | Admitting: Obstetrics and Gynecology

## 2017-10-12 ENCOUNTER — Other Ambulatory Visit: Payer: Self-pay

## 2017-10-12 DIAGNOSIS — O24119 Pre-existing diabetes mellitus, type 2, in pregnancy, unspecified trimester: Secondary | ICD-10-CM

## 2017-10-12 DIAGNOSIS — F419 Anxiety disorder, unspecified: Secondary | ICD-10-CM | POA: Diagnosis present

## 2017-10-12 DIAGNOSIS — D62 Acute posthemorrhagic anemia: Secondary | ICD-10-CM | POA: Diagnosis not present

## 2017-10-12 DIAGNOSIS — O09299 Supervision of pregnancy with other poor reproductive or obstetric history, unspecified trimester: Secondary | ICD-10-CM

## 2017-10-12 DIAGNOSIS — Z862 Personal history of diseases of the blood and blood-forming organs and certain disorders involving the immune mechanism: Secondary | ICD-10-CM

## 2017-10-12 DIAGNOSIS — O99354 Diseases of the nervous system complicating childbirth: Secondary | ICD-10-CM | POA: Diagnosis present

## 2017-10-12 DIAGNOSIS — O99353 Diseases of the nervous system complicating pregnancy, third trimester: Secondary | ICD-10-CM

## 2017-10-12 DIAGNOSIS — O99334 Smoking (tobacco) complicating childbirth: Secondary | ICD-10-CM | POA: Diagnosis present

## 2017-10-12 DIAGNOSIS — O2412 Pre-existing diabetes mellitus, type 2, in childbirth: Secondary | ICD-10-CM | POA: Diagnosis present

## 2017-10-12 DIAGNOSIS — O99824 Streptococcus B carrier state complicating childbirth: Secondary | ICD-10-CM | POA: Diagnosis present

## 2017-10-12 DIAGNOSIS — G40909 Epilepsy, unspecified, not intractable, without status epilepticus: Secondary | ICD-10-CM | POA: Diagnosis present

## 2017-10-12 DIAGNOSIS — Z3A39 39 weeks gestation of pregnancy: Secondary | ICD-10-CM

## 2017-10-12 DIAGNOSIS — F329 Major depressive disorder, single episode, unspecified: Secondary | ICD-10-CM | POA: Diagnosis present

## 2017-10-12 DIAGNOSIS — O0990 Supervision of high risk pregnancy, unspecified, unspecified trimester: Secondary | ICD-10-CM

## 2017-10-12 DIAGNOSIS — O99330 Smoking (tobacco) complicating pregnancy, unspecified trimester: Secondary | ICD-10-CM

## 2017-10-12 DIAGNOSIS — O99519 Diseases of the respiratory system complicating pregnancy, unspecified trimester: Secondary | ICD-10-CM

## 2017-10-12 DIAGNOSIS — E119 Type 2 diabetes mellitus without complications: Secondary | ICD-10-CM | POA: Diagnosis present

## 2017-10-12 DIAGNOSIS — O99214 Obesity complicating childbirth: Secondary | ICD-10-CM | POA: Diagnosis present

## 2017-10-12 DIAGNOSIS — F17210 Nicotine dependence, cigarettes, uncomplicated: Secondary | ICD-10-CM | POA: Diagnosis present

## 2017-10-12 DIAGNOSIS — O9081 Anemia of the puerperium: Secondary | ICD-10-CM | POA: Diagnosis not present

## 2017-10-12 DIAGNOSIS — O34219 Maternal care for unspecified type scar from previous cesarean delivery: Secondary | ICD-10-CM | POA: Diagnosis present

## 2017-10-12 DIAGNOSIS — Z98891 History of uterine scar from previous surgery: Secondary | ICD-10-CM

## 2017-10-12 DIAGNOSIS — Z7984 Long term (current) use of oral hypoglycemic drugs: Secondary | ICD-10-CM

## 2017-10-12 DIAGNOSIS — O99344 Other mental disorders complicating childbirth: Secondary | ICD-10-CM | POA: Diagnosis present

## 2017-10-12 DIAGNOSIS — O34211 Maternal care for low transverse scar from previous cesarean delivery: Secondary | ICD-10-CM | POA: Diagnosis present

## 2017-10-12 DIAGNOSIS — J45909 Unspecified asthma, uncomplicated: Secondary | ICD-10-CM

## 2017-10-12 LAB — RPR: RPR: NONREACTIVE

## 2017-10-12 LAB — GLUCOSE, CAPILLARY
GLUCOSE-CAPILLARY: 89 mg/dL (ref 65–99)
GLUCOSE-CAPILLARY: 102 mg/dL — AB (ref 65–99)
Glucose-Capillary: 104 mg/dL — ABNORMAL HIGH (ref 65–99)
Glucose-Capillary: 112 mg/dL — ABNORMAL HIGH (ref 65–99)

## 2017-10-12 SURGERY — Surgical Case
Anesthesia: Spinal

## 2017-10-12 MED ORDER — OXYCODONE-ACETAMINOPHEN 5-325 MG PO TABS: 2.0000 | ORAL_TABLET | ORAL | Status: DC | PRN

## 2017-10-12 MED ORDER — ONDANSETRON HCL 4 MG/2ML IJ SOLN: 4.0000 mg | Freq: Once | INTRAMUSCULAR | Status: DC | PRN

## 2017-10-12 MED ORDER — FENTANYL CITRATE (PF) 100 MCG/2ML IJ SOLN: 25.0000 ug | INTRAMUSCULAR | Status: DC | PRN

## 2017-10-12 MED ORDER — WITCH HAZEL-GLYCERIN EX PADS: 1.0000 "application " | MEDICATED_PAD | CUTANEOUS | Status: DC | PRN

## 2017-10-12 MED ORDER — SODIUM CHLORIDE 0.9 % IV SOLN
INTRAVENOUS | Status: DC
  Administered 2017-10-12: 1000 mL via INTRAVENOUS

## 2017-10-12 MED ORDER — PHENYLEPHRINE HCL 10 MG/ML IJ SOLN
INTRAMUSCULAR | Status: DC | PRN
  Administered 2017-10-12: 100 ug via INTRAVENOUS

## 2017-10-12 MED ORDER — BUPIVACAINE HCL (PF) 0.5 % IJ SOLN
INTRAMUSCULAR | Status: DC | PRN
  Administered 2017-10-12: 10 mL

## 2017-10-12 MED ORDER — DIPHENHYDRAMINE HCL 25 MG PO CAPS: 25.0000 mg | ORAL_CAPSULE | Freq: Four times a day (QID) | ORAL | Status: DC | PRN

## 2017-10-12 MED ORDER — EPHEDRINE SULFATE 50 MG/ML IJ SOLN
INTRAMUSCULAR | Status: DC | PRN
  Administered 2017-10-12: 5 mg via INTRAVENOUS

## 2017-10-12 MED ORDER — OXYTOCIN 40 UNITS IN LACTATED RINGERS INFUSION - SIMPLE MED
INTRAVENOUS | Status: AC
  Filled 2017-10-12: qty 1000

## 2017-10-12 MED ORDER — SENNOSIDES-DOCUSATE SODIUM 8.6-50 MG PO TABS
2.0000 | ORAL_TABLET | ORAL | Status: DC
  Administered 2017-10-14: 2 via ORAL
  Filled 2017-10-12 (×3): qty 2

## 2017-10-12 MED ORDER — OXYCODONE-ACETAMINOPHEN 5-325 MG PO TABS
1.0000 | ORAL_TABLET | ORAL | Status: DC | PRN
  Administered 2017-10-12 – 2017-10-14 (×7): 1 via ORAL
  Filled 2017-10-12 (×6): qty 1

## 2017-10-12 MED ORDER — ACETAMINOPHEN 325 MG PO TABS
650.0000 mg | ORAL_TABLET | ORAL | Status: DC | PRN
  Administered 2017-10-12: 650 mg via ORAL

## 2017-10-12 MED ORDER — OXYCODONE-ACETAMINOPHEN 5-325 MG PO TABS
2.0000 | ORAL_TABLET | ORAL | Status: DC | PRN
  Filled 2017-10-12: qty 2

## 2017-10-12 MED ORDER — SIMETHICONE 80 MG PO CHEW
80.0000 mg | CHEWABLE_TABLET | ORAL | Status: DC | PRN
  Administered 2017-10-13: 80 mg via ORAL
  Filled 2017-10-12: qty 1

## 2017-10-12 MED ORDER — BUPIVACAINE ON-Q PAIN PUMP (FOR ORDER SET NO CHG)
INJECTION | Status: DC
  Filled 2017-10-12: qty 1

## 2017-10-12 MED ORDER — LACTATED RINGERS IV SOLN: INTRAVENOUS | Status: DC

## 2017-10-12 MED ORDER — IBUPROFEN 600 MG PO TABS
600.0000 mg | ORAL_TABLET | Freq: Four times a day (QID) | ORAL | Status: DC
  Administered 2017-10-12 – 2017-10-14 (×8): 600 mg via ORAL
  Filled 2017-10-12 (×8): qty 1

## 2017-10-12 MED ORDER — AMPHETAMINE-DEXTROAMPHET ER 5 MG PO CP24
10.0000 mg | ORAL_CAPSULE | Freq: Every day | ORAL | Status: DC
  Filled 2017-10-12 (×3): qty 2

## 2017-10-12 MED ORDER — GLYBURIDE 2.5 MG PO TABS
2.5000 mg | ORAL_TABLET | Freq: Two times a day (BID) | ORAL | Status: DC
  Administered 2017-10-13 – 2017-10-14 (×3): 2.5 mg via ORAL
  Filled 2017-10-12 (×4): qty 1

## 2017-10-12 MED ORDER — SIMETHICONE 80 MG PO CHEW
80.0000 mg | CHEWABLE_TABLET | Freq: Three times a day (TID) | ORAL | Status: DC
  Administered 2017-10-12 – 2017-10-14 (×6): 80 mg via ORAL
  Filled 2017-10-12 (×4): qty 1

## 2017-10-12 MED ORDER — LACTATED RINGERS IV SOLN
INTRAVENOUS | Status: DC | PRN
  Administered 2017-10-12 (×2): via INTRAVENOUS

## 2017-10-12 MED ORDER — SOD CITRATE-CITRIC ACID 500-334 MG/5ML PO SOLN
30.0000 mL | ORAL | Status: DC
  Filled 2017-10-12: qty 30
  Filled 2017-10-12 (×2): qty 15

## 2017-10-12 MED ORDER — ACETAMINOPHEN 325 MG PO TABS
ORAL_TABLET | ORAL | Status: AC
  Filled 2017-10-12: qty 2

## 2017-10-12 MED ORDER — PROPOFOL 10 MG/ML IV BOLUS
INTRAVENOUS | Status: AC
  Filled 2017-10-12: qty 20

## 2017-10-12 MED ORDER — DEXTROSE 5 % IV SOLN
2.0000 g | INTRAVENOUS | Status: AC
  Administered 2017-10-12: 2 g via INTRAVENOUS
  Filled 2017-10-12: qty 2000

## 2017-10-12 MED ORDER — BUPIVACAINE HCL (PF) 0.5 % IJ SOLN
INTRAMUSCULAR | Status: AC
  Filled 2017-10-12: qty 30

## 2017-10-12 MED ORDER — ONDANSETRON HCL 4 MG/2ML IJ SOLN
INTRAMUSCULAR | Status: DC | PRN
  Administered 2017-10-12: 4 mg via INTRAVENOUS

## 2017-10-12 MED ORDER — BUPIVACAINE HCL 0.5 % IJ SOLN
20.0000 mL | INTRAMUSCULAR | Status: DC
  Filled 2017-10-12: qty 20

## 2017-10-12 MED ORDER — SODIUM CHLORIDE 0.9 % IV SOLN
INTRAVENOUS | Status: DC | PRN
  Administered 2017-10-12: 50 ug/min via INTRAVENOUS

## 2017-10-12 MED ORDER — OXYTOCIN 40 UNITS IN LACTATED RINGERS INFUSION - SIMPLE MED
INTRAVENOUS | Status: DC | PRN
  Administered 2017-10-12: 600 mL via INTRAVENOUS

## 2017-10-12 MED ORDER — BUPIVACAINE HCL (PF) 0.75 % IJ SOLN
INTRAMUSCULAR | Status: DC | PRN
  Administered 2017-10-12: 1.6 mL

## 2017-10-12 MED ORDER — PRENATAL MULTIVITAMIN CH
1.0000 | ORAL_TABLET | Freq: Every day | ORAL | Status: DC
  Administered 2017-10-12 – 2017-10-14 (×3): 1 via ORAL
  Filled 2017-10-12 (×3): qty 1

## 2017-10-12 MED ORDER — COCONUT OIL OIL: 1.0000 "application " | TOPICAL_OIL | Status: DC | PRN

## 2017-10-12 MED ORDER — OXYTOCIN 40 UNITS IN LACTATED RINGERS INFUSION - SIMPLE MED
2.5000 [IU]/h | INTRAVENOUS | Status: DC
  Administered 2017-10-12 (×2): 2.5 [IU]/h via INTRAVENOUS
  Filled 2017-10-12 (×2): qty 1000

## 2017-10-12 MED ORDER — MENTHOL 3 MG MT LOZG: 1.0000 | LOZENGE | OROMUCOSAL | Status: DC | PRN

## 2017-10-12 MED ORDER — OXYCODONE-ACETAMINOPHEN 5-325 MG PO TABS: 1.0000 | ORAL_TABLET | ORAL | Status: DC | PRN

## 2017-10-12 MED ORDER — DIBUCAINE 1 % RE OINT: 1.0000 "application " | TOPICAL_OINTMENT | RECTAL | Status: DC | PRN

## 2017-10-12 MED ORDER — SIMETHICONE 80 MG PO CHEW
80.0000 mg | CHEWABLE_TABLET | ORAL | Status: DC
  Filled 2017-10-12 (×2): qty 1

## 2017-10-12 MED ORDER — BUPIVACAINE 0.25 % ON-Q PUMP DUAL CATH 400 ML
400.0000 mL | INJECTION | Status: DC
  Filled 2017-10-12: qty 400

## 2017-10-12 SURGICAL SUPPLY — 34 items
ADH SKN CLS APL DERMABOND .7 (GAUZE/BANDAGES/DRESSINGS) ×1
BAG COUNTER SPONGE EZ (MISCELLANEOUS) ×2 IMPLANT
BAG SPNG 4X4 CLR HAZ (MISCELLANEOUS) ×1
CANISTER SUCT 3000ML PPV (MISCELLANEOUS) ×3 IMPLANT
CATH KIT ON-Q SILVERSOAK 5 (CATHETERS) ×2 IMPLANT
CATH KIT ON-Q SILVERSOAK 5IN (CATHETERS) ×6 IMPLANT
CHLORAPREP W/TINT 26ML (MISCELLANEOUS) ×6 IMPLANT
CLOSURE WOUND 1/2 X4 (GAUZE/BANDAGES/DRESSINGS) ×1
COUNTER SPONGE BAG EZ (MISCELLANEOUS) ×1
DERMABOND ADVANCED (GAUZE/BANDAGES/DRESSINGS) ×2
DERMABOND ADVANCED .7 DNX12 (GAUZE/BANDAGES/DRESSINGS) ×1 IMPLANT
DRSG OPSITE POSTOP 4X10 (GAUZE/BANDAGES/DRESSINGS) ×3 IMPLANT
DRSG TELFA 3X8 NADH (GAUZE/BANDAGES/DRESSINGS) ×3 IMPLANT
ELECT CAUTERY BLADE 6.4 (BLADE) ×1 IMPLANT
ELECT REM PT RETURN 9FT ADLT (ELECTROSURGICAL) ×3
ELECTRODE REM PT RTRN 9FT ADLT (ELECTROSURGICAL) ×1 IMPLANT
GAUZE SPONGE 4X4 12PLY STRL (GAUZE/BANDAGES/DRESSINGS) ×3 IMPLANT
GLOVE BIO SURGEON STRL SZ7 (GLOVE) ×3 IMPLANT
GLOVE INDICATOR 7.5 STRL GRN (GLOVE) ×3 IMPLANT
GOWN STRL REUS W/ TWL LRG LVL3 (GOWN DISPOSABLE) ×3 IMPLANT
GOWN STRL REUS W/TWL LRG LVL3 (GOWN DISPOSABLE) ×9
NS IRRIG 1000ML POUR BTL (IV SOLUTION) ×3 IMPLANT
PACK C SECTION AR (MISCELLANEOUS) ×3 IMPLANT
PAD DRESSING TELFA 3X8 NADH (GAUZE/BANDAGES/DRESSINGS) ×1 IMPLANT
PAD OB MATERNITY 4.3X12.25 (PERSONAL CARE ITEMS) ×3 IMPLANT
PAD PREP 24X41 OB/GYN DISP (PERSONAL CARE ITEMS) ×3 IMPLANT
STRIP CLOSURE SKIN 1/2X4 (GAUZE/BANDAGES/DRESSINGS) ×2 IMPLANT
SUT MNCRL AB 4-0 PS2 18 (SUTURE) ×1 IMPLANT
SUT PDS AB 1 TP1 96 (SUTURE) ×4 IMPLANT
SUT PLAIN 2 0 XLH (SUTURE) ×2 IMPLANT
SUT VIC AB 0 CT1 36 (SUTURE) ×4 IMPLANT
SUT VIC AB 0 CTX 36 (SUTURE)
SUT VIC AB 0 CTX36XBRD ANBCTRL (SUTURE) ×2 IMPLANT
SUT VIC AB 2-0 CT1 36 (SUTURE) ×1 IMPLANT

## 2017-10-12 NOTE — Anesthesia Procedure Notes (Signed)
Procedure Name: MAC Performed by: Demetrius Charity, CRNA Pre-anesthesia Checklist: Patient identified, Emergency Drugs available, Suction available, Patient being monitored and Timeout performed Oxygen Delivery Method: Nasal cannula

## 2017-10-12 NOTE — Anesthesia Procedure Notes (Signed)
Spinal  Patient location during procedure: OR Start time: 10/12/2017 9:00 AM End time: 10/12/2017 9:10 AM Staffing Anesthesiologist: Gunnar Fusi, MD Performed: anesthesiologist  Preanesthetic Checklist Completed: patient identified, site marked, surgical consent, pre-op evaluation, timeout performed, IV checked, risks and benefits discussed and monitors and equipment checked Spinal Block Patient position: sitting Prep: Betadine Patient monitoring: heart rate, continuous pulse ox, blood pressure and cardiac monitor Approach: midline Location: L4-5 Injection technique: single-shot Needle Needle type: Whitacre and Introducer  Needle gauge: 25 G Needle length: 9 cm Assessment Sensory level: T4 Additional Notes Negative paresthesia. Negative blood return. Positive free-flowing CSF. Expiration date of kit checked and confirmed. Patient tolerated procedure well, without complications.

## 2017-10-12 NOTE — Progress Notes (Signed)
Pt to OR.

## 2017-10-12 NOTE — Consult Note (Signed)
Neonatology Note:   Attendance at C-section:    I was asked by Dr. Bonney AidStaebler to attend this repeat C/S at term. The mother is a G5P1A3 O pos, GBS positive with Type 2 DM, on glyburide and metformin; bipolar disorder, ADHD, anxiety/depression, and is on Prozac, Adderall, and Klonopin for history of seizures (not since 2016). She smokes cigarettes, 1/2 pack/day during pregnancy. ROM at delivery, fluid clear. Infant vigorous with good spontaneous cry and tone. Cord clamping was delayed for 40 seconds. Needed no suctioning. Ap 8/9. Lungs clear to ausc in DR. To CN to care of Pediatrician.   Doretha Souhristie C. Cashus Halterman, MD

## 2017-10-12 NOTE — Discharge Summary (Addendum)
Obstetric Discharge Summary Reason for Admission: cesarean section Prenatal Procedures: none Intrapartum Procedures: cesarean: low cervical, transverse  Postpartum Procedures: none Complications-Operative and Postpartum: none Hemoglobin  Date Value Ref Range Status  10/13/2017 9.5 (L) 12.0 - 16.0 g/dL Final  07/27/2017 11.7 11.1 - 15.9 g/dL Final   HCT  Date Value Ref Range Status  10/13/2017 30.1 (L) 35.0 - 47.0 % Final   Hematocrit  Date Value Ref Range Status  07/27/2017 35.9 34.0 - 46.6 % Final    Physical Exam:  General: alert, cooperative and no distress Lochia: appropriate, rubra, small Uterine Fundus: firm, U/1 Incision: healing well, no significant drainage DVT Evaluation: No evidence of DVT seen on physical exam. No cords or calf tenderness. No significant calf/ankle edema.  Discharge Diagnoses: Term Pregnancy-delivered  Discharge Information: Date: 10/14/2017 Activity: pelvic rest no heavy lifting Diet: routine Allergies as of 10/14/2017      Reactions   Ivp Dye [iodinated Diagnostic Agents] Anaphylaxis   Per patient, she does not have problems with betadine   Latex Rash   Metrizamide Anaphylaxis   Morphine Other (See Comments)   bradycradia   Mushroom Extract Complex Anaphylaxis   Hives then swelling of throat and can not breath   Sulfasalazine Hives   Tomato Anaphylaxis   Toradol [ketorolac Tromethamine] Anaphylaxis, Hives, Swelling   Aspirin Hives   Dilaudid [hydromorphone Hcl] Hives   Lamictal [lamotrigine] Hives   Sulfa Antibiotics Hives   Tramadol Swelling, Rash, Hives   Adhesive [tape] Itching   USE PAPER TAPE   Iohexol Rash   (contrast dye) causes red bumps      Medication List    STOP taking these medications   metFORMIN 500 MG tablet Commonly known as:  GLUCOPHAGE     TAKE these medications   ACCU-CHEK FASTCLIX LANCETS Misc 1 Units by Percutaneous route 4 (four) times daily.   ACCU-CHEK NANO SMARTVIEW w/Device Kit 1 kit by  Subdermal route as directed. Check blood sugars for fasting, and two hours after breakfast, lunch and dinner (4 checks daily)   acetaminophen 500 MG tablet Commonly known as:  TYLENOL Take 500 mg by mouth every 6 (six) hours as needed for moderate pain or headache.   albuterol 108 (90 Base) MCG/ACT inhaler Commonly known as:  PROVENTIL HFA;VENTOLIN HFA Inhale 2 puffs into the lungs every 6 (six) hours as needed. For shortness of breath   amphetamine-dextroamphetamine 10 MG 24 hr capsule Commonly known as:  ADDERALL XR Take 10 mg by mouth daily.   blood glucose meter kit and supplies Kit Dispense based on patient and insurance preference. Use up to four times daily as directed. (FOR ICD-9 250.00, 250.01).   clonazePAM 1 MG tablet Commonly known as:  KLONOPIN Take 1 mg by mouth 2 (two) times daily.   clotrimazole 1 % cream Commonly known as:  LOTRIMIN Apply 1 application topically daily as needed (boils).   ferrous sulfate 325 (65 FE) MG tablet Take 325 mg by mouth 2 (two) times daily.   FLUoxetine 20 MG tablet Commonly known as:  PROZAC Take 20 mg by mouth daily. PATIENT CURRENTLY NOT TAKING DUE TO NAUSEA AND VOMITING WHILE PREGNANT   glucose blood test strip Commonly known as:  ACCU-CHEK SMARTVIEW Use as instructed to check blood sugars   glyBURIDE 2.5 MG tablet Commonly known as:  DIABETA Take 1 tablet (2.5 mg total) by mouth 2 (two) times daily with a meal. What changed:  when to take this   ibuprofen 600 MG tablet  Commonly known as:  ADVIL,MOTRIN Take 1 tablet (600 mg total) by mouth every 6 (six) hours as needed.   medroxyPROGESTERone 150 MG/ML injection Commonly known as:  DEPO-PROVERA Inject 1 mL (150 mg total) into the muscle every 3 (three) months.   oxyCODONE 5 MG immediate release tablet Commonly known as:  ROXICODONE Take 1 tablet (5 mg total) by mouth every 4 (four) hours as needed for severe pain.   ranitidine 150 MG tablet Commonly known as:   ZANTAC Take 1 tablet (150 mg total) by mouth at bedtime.   simethicone 80 MG chewable tablet Commonly known as:  MYLICON Chew 1 tablet (80 mg total) by mouth 3 (three) times daily after meals.            Discharge Care Instructions  (From admission, onward)        Start     Ordered   10/14/17 0000  Discharge wound care:    Comments:  You may apply a light dressing for minor discharge from the incision or to keep waistbands of clothing from rubbing.  You may also have been discharge with a clear dressing in which case this will be removed at your postoperative clinic visit.  You may shower, use soap on your incision.  Avoiding baths or soaking the incision in the first 6 weeks following your surgery.Marland Kitchen   10/14/17 1991      Condition: stable Instructions: refer to practice specific booklet Discharge to: home Follow-up Information    Malachy Mood, MD Follow up in 1 week(s).   Specialty:  Obstetrics and Gynecology Why:  For wound re-check on 10/19/17 @ 11:10 with Dr. Meryl Dare information: [place] [id number] [phone]           Newborn Data: Live born female  Birth Weight: 9 lb 9.8 oz (4360 g) APGAR: 76, 51  Newborn Delivery   Birth date/time:  10/12/2017 09:40:00 Delivery type:  C-Section, Low Transverse C-section categorization:  Repeat     Home with mother.  Rexene Agent 10/14/2017, 12:40 PM

## 2017-10-12 NOTE — Op Note (Signed)
Preoperative Diagnosis: 1) 27 y.o. [id number] at 4948w6d 2) History of prior Cesarean section 3) Morbid obesity Body mass index is 46.09 kg/m. 4) DMII  Postoperative Diagnosis: 1) 27 y.o. [id number] at 6248w6d 2) History of prior Cesarean section 3) Morbid obesity Body mass index is 46.09 kg/m. 4) DMII  Operation Performed: Repeat low transverse C-section via pfannenstiel skin incision  Indiciation: Prior cesarean section  Anesthesia: Spinal  Primary Surgeon: Vena AustriaAndreas Kreg Earhart, MD   Assistant: Thomasene MohairStephen Jackson, MD  Preoperative Antibiotics: 2g Ancef  Estimated Blood Loss: 800 mL  IV Fluids: 1600mL  Drains or Tubes: Foley to gravity drainage, ON-Q catheter system  Implants: none  Specimens Removed: none  Complications: none  Intraoperative Findings:  Normal tubes ovaries and uterus.  Delivery resulted in the birth of a liveborn female, APGAR (1 MIN): 8   APGAR (5 MINS): 9   APGAR (10 MINS):  , weight    Patient Condition:stable  Procedure in Detail:  Patient was taken to the operating room were she was administered regional anesthesia.  She was positioned in the supine position, prepped and draped in the  Usual sterile fashion.  Prior to proceeding with the case a time out was performed and the level of anesthetic was checked and noted to be adequate.  Utilizing the scalpel a pfannenstiel skin incision was made 2cm above the pubic symphysis utilizing the patient's pre-existing scar and carried down sharply to the the level of the rectus fascia.  The fascia was incised in the midline using the scalpel and then extended using mayo scissors.  The superior border of the rectus fascia was grasped with two Kocher clamps and the underlying rectus muscles were dissected of the fascia using blunt dissection.  The median raphae was incised using Mayo scissors.   The inferior border of the rectus fascia was dissected of the rectus muscles in a similar fashion.  The midline was identified, the  peritoneum was entered bluntly and expanded using manual tractions.  The uterus was noted to be in a none rotated position.  Next the bladder blade was placed retracting the bladder caudally.  A bladder flap was not created.  A omental adhesion to the abdominal wall was suture ligated and transected.  A low transverse incision was scored on the lower uterine segment.  The hysterotomy was entered bluntly using the operators finger.  The hysterotomy incision was extended using manual traction.  The operators hand was placed within the hysterotomy position noting the fetus to be within the OA position.  The vertex was grasped, flexed, brought to the incision, and delivered a traumatically using fundal pressure.  The remainder of the body delivered with ease.  The infant was suctioned, cord was clamped and cut before handing off to the awaiting neonatologist.  The placenta was delivered using manual extraction.  The uterus was exteriorized, wiped clean of clots and debris using two moist laps.  The hysterotomy was closed using a two layer closure of 0 Vicryl, with the first being a running locked, the second a vertical imbricating.  The uterus was returned to the abdomen.  The peritoneal gutters were wiped clean of clots and debris using two moist laps.  The hysterotomy incision was re-inspected noted to be hemostatic.  The rectus muscles were inspected noted to be hemostatic.  The superior border of the rectus fascia was grasped with a Kocher clamp.  The ON-Q trocars were then placed 4cm above the superior border of the incision and tunneled subfascially.  The  introducers were removed and the catheters were threaded through the sleeves after which the sleeves were removed.  The fascia was closed using a looped #1 PDS in a running fashion taking 1cm by 1cm bites.  The subcutaneous tissue was irrigated using warm saline, hemostasis achieved using the bovie.  The subcutaneous dead space was greater than 3cm and was  closed.  The subcutaneous dead space was obliterated by using a 53-T 0 Chromic in a running fashion.  The skin was closed using staples.  Sponge needle and instrument counts were corrects times two.  The patient tolerated the procedure well and was taken to the recovery room in stable condition.

## 2017-10-12 NOTE — Transfer of Care (Signed)
Immediate Anesthesia Transfer of Care Note  Patient: Faith [name]  Procedure(s) Performed: CESAREAN SECTION (N/A )  Patient Location: PACU  Anesthesia Type:Spinal  Level of Consciousness: awake, alert  and oriented  Airway & Oxygen Therapy: Patient Spontanous Breathing  Post-op Assessment: Report given to RN and Post -op Vital signs reviewed and stable  Post vital signs: Reviewed and stable  Last Vitals:  Vitals:   10/12/17 0714  BP: 100/66  Pulse: (!) 106  Resp: 16  Temp: 36.7 C    Last Pain:  Vitals:   10/12/17 0714  TempSrc: Oral  PainSc: 0-No pain         Complications: No apparent anesthesia complications

## 2017-10-12 NOTE — Progress Notes (Signed)
Patient blood glucose 104 at 11:19 AM.

## 2017-10-12 NOTE — Anesthesia Preprocedure Evaluation (Signed)
Anesthesia Evaluation  Patient identified by MRN, date of birth, ID band Patient awake    Reviewed: Allergy & Precautions, NPO status , Patient's Chart, lab work & pertinent test results  History of Anesthesia Complications Negative for: history of anesthetic complications  Airway Mallampati: III       Dental   Pulmonary asthma , neg sleep apnea, neg COPD, Current Smoker,           Cardiovascular (-) hypertension(-) Past MI and (-) CHF (-) dysrhythmias (-) Valvular Problems/Murmurs     Neuro/Psych Seizures - (none for years), Well Controlled,  Anxiety Depression Bipolar Disorder    GI/Hepatic Neg liver ROS, GERD  Medicated and Poorly Controlled,  Endo/Other  diabetes, Type 2, Oral Hypoglycemic Agents  Renal/GU negative Renal ROS     Musculoskeletal   Abdominal   Peds  Hematology  (+) anemia ,   Anesthesia Other Findings   Reproductive/Obstetrics                             Anesthesia Physical Anesthesia Plan  ASA: III  Anesthesia Plan: Spinal   Post-op Pain Management:    Induction:   PONV Risk Score and Plan:   Airway Management Planned:   Additional Equipment:   Intra-op Plan:   Post-operative Plan:   Informed Consent: I have reviewed the patients History and Physical, chart, labs and discussed the procedure including the risks, benefits and alternatives for the proposed anesthesia with the patient or authorized representative who has indicated his/her understanding and acceptance.     Plan Discussed with:   Anesthesia Plan Comments:         Anesthesia Quick Evaluation

## 2017-10-12 NOTE — Lactation Note (Signed)
This note was copied from a baby's chart. Lactation Consultation Note  Patient Name: Faith [name] [id number]'s Date: 10/12/2017     Maternal Data Formula Feeding for Exclusion: (P) Yes Reason for exclusion: (P) Mother's choice to formula and breast feed on admission  Feeding    LATCH Score                   Interventions    Lactation Tools Discussed/Used     Consult Status  Parents state that pumping and bottle feeding is going well and that nothing else is needed at this time.     [name] 10/12/2017, 10:39 PM

## 2017-10-12 NOTE — Anesthesia Post-op Follow-up Note (Signed)
Anesthesia QCDR form completed.        

## 2017-10-12 NOTE — H&P (Signed)
Date of Initial H&P: 10/11/17  History reviewed, patient examined, no change in status, stable for surgery.  Last growth 616w1d 2750g, 6lbs 1oz c/w 61%

## 2017-10-13 LAB — CBC
HCT: 30.1 % — ABNORMAL LOW (ref 35.0–47.0)
HEMOGLOBIN: 9.5 g/dL — AB (ref 12.0–16.0)
MCH: 22.3 pg — ABNORMAL LOW (ref 26.0–34.0)
MCHC: 31.7 g/dL — ABNORMAL LOW (ref 32.0–36.0)
MCV: 70.2 fL — ABNORMAL LOW (ref 80.0–100.0)
Platelets: 228 10*3/uL (ref 150–440)
RBC: 4.28 MIL/uL (ref 3.80–5.20)
RDW: 18.2 % — ABNORMAL HIGH (ref 11.5–14.5)
WBC: 10.9 10*3/uL (ref 3.6–11.0)

## 2017-10-13 LAB — GLUCOSE, CAPILLARY
GLUCOSE-CAPILLARY: 118 mg/dL — AB (ref 65–99)
Glucose-Capillary: 106 mg/dL — ABNORMAL HIGH (ref 65–99)

## 2017-10-13 NOTE — Anesthesia Postprocedure Evaluation (Signed)
Anesthesia Post Note  Patient: Faith [name]  Procedure(s) Performed: CESAREAN SECTION (N/A )  Patient location during evaluation: Mother Baby Anesthesia Type: Spinal Level of consciousness: oriented and awake and alert Pain management: pain level controlled Vital Signs Assessment: post-procedure vital signs reviewed and stable Respiratory status: spontaneous breathing and respiratory function stable Cardiovascular status: blood pressure returned to baseline and stable Postop Assessment: no headache, no backache and no apparent nausea or vomiting Anesthetic complications: no     Last Vitals:  Vitals:   10/13/17 0439 10/13/17 0815  BP: 100/60 94/60  Pulse: 77 85  Resp: 17 18  Temp: 36.8 C 36.7 C  SpO2: 96% 98%    Last Pain:  Vitals:   10/13/17 0815  TempSrc: Oral  PainSc:                  Lyn RecordsNoles,  Kearie Mennen R

## 2017-10-13 NOTE — Lactation Note (Signed)
This note was copied from a baby's chart. Lactation Consultation Note  Patient Name: Faith [name] [id number]'s Date: 10/13/2017   During LC rounds, Mom states feedings are going "well" and she declined any instruction or assistance this morning. She is pumping some and bottle feeding formula some.      Maternal Data    Feeding Feeding Type: Bottle Fed - Formula Nipple Type: Slow - flow  LATCH Score                   Interventions    Lactation Tools Discussed/Used     Consult Status      Faith [name] 10/13/2017, 11:17 AM

## 2017-10-13 NOTE — Progress Notes (Signed)
POD #1 s/p repeat Cesarean section. T2DM on glyburide Subjective:   Tired, but doing well. Tolerating regular diet. Foley discontinued this AM. Is bottle feeding  Objective:  Blood pressure 94/60, pulse 85, temperature 98 F (36.7 C), temperature source Oral, resp. rate 18, height 5' (1.524 m), weight 107 kg (236 lb), last menstrual period 12/28/2016, SpO2 98 %, bottle feeding  Urine output 1275ml. Intake 2810 ml  General: WF in NAD, sleepy Pulmonary: no increased work of breathing, CTAB Heart: RRR without murmur Abdomen: non-distended, non-tender, fundus firm at level of umbilicus-2FB, bowel sounds hypoactive Incision: Honey comb dressing C+D+I, ON Q pump intact Extremities: no edema, no erythema, no tenderness  Results for orders placed or performed during the hospital encounter of 10/12/17 (from the past 72 hour(s))  Glucose, capillary     Status: None   Collection Time: 10/12/17  7:49 AM  Result Value Ref Range   Glucose-Capillary 89 65 - 99 mg/dL  Glucose, capillary     Status: Abnormal   Collection Time: 10/12/17 11:19 AM  Result Value Ref Range   Glucose-Capillary 104 (H) 65 - 99 mg/dL  Glucose, capillary     Status: Abnormal   Collection Time: 10/12/17  4:47 PM  Result Value Ref Range   Glucose-Capillary 112 (H) 65 - 99 mg/dL  Glucose, capillary     Status: Abnormal   Collection Time: 10/12/17  8:53 PM  Result Value Ref Range   Glucose-Capillary 102 (H) 65 - 99 mg/dL  CBC     Status: Abnormal   Collection Time: 10/13/17  5:44 AM  Result Value Ref Range   WBC 10.9 3.6 - 11.0 K/uL   RBC 4.28 3.80 - 5.20 MIL/uL   Hemoglobin 9.5 (L) 12.0 - 16.0 g/dL   HCT 04.530.1 (L) 40.935.0 - 81.147.0 %   MCV 70.2 (L) 80.0 - 100.0 fL   MCH 22.3 (L) 26.0 - 34.0 pg   MCHC 31.7 (L) 32.0 - 36.0 g/dL   RDW 91.418.2 (H) 78.211.5 - 95.614.5 %   Platelets 228 150 - 440 K/uL    Comment: Performed at [facility], [address] [place], [place] [id number]  Glucose, capillary     Status: Abnormal   Collection Time: 10/13/17  7:30 AM  Result Value Ref Range   Glucose-Capillary 118 (H) 65 - 99 mg/dL   Comment 1 Notify RN      Assessment:   27 y.o. [id number] postoperativeday # 1-stable  Trial of voiding  Ambulate  FSBS -less than 120   Plan:  1) Acute blood loss anemia - hemodynamically stable - po ferrous sulfate and vitamins  2) --/--/O POS (01/07 1043) /RI/ VI  3) TDAP UTD (08/09/2017)  4) Bottle/ Depo  5) Discharge home on POD #2 or #3  Farrel Connersolleen Joenathan Sakuma, CNM

## 2017-10-14 LAB — GLUCOSE, CAPILLARY: Glucose-Capillary: 120 mg/dL — ABNORMAL HIGH (ref 65–99)

## 2017-10-14 MED ORDER — SIMETHICONE 80 MG PO CHEW: 80.0000 mg | CHEWABLE_TABLET | Freq: Three times a day (TID) | ORAL | 0 refills | Status: DC

## 2017-10-14 MED ORDER — OXYCODONE HCL 5 MG PO TABS: 5.0000 mg | ORAL_TABLET | ORAL | 0 refills | Status: DC | PRN

## 2017-10-14 MED ORDER — IBUPROFEN 600 MG PO TABS: 600.0000 mg | ORAL_TABLET | Freq: Four times a day (QID) | ORAL | 3 refills | Status: DC | PRN

## 2017-10-14 MED ORDER — MEDROXYPROGESTERONE ACETATE 150 MG/ML IM SUSP: 150.0000 mg | INTRAMUSCULAR | 2 refills | Status: DC

## 2017-10-14 MED ORDER — MEDROXYPROGESTERONE ACETATE 150 MG/ML IM SUSP
150.0000 mg | INTRAMUSCULAR | Status: DC
  Administered 2017-10-14: 150 mg via INTRAMUSCULAR
  Filled 2017-10-14: qty 1

## 2017-10-14 NOTE — Progress Notes (Signed)
Discharge order received from doctor. Depo given prior to discharge. Reviewed discharge instructions and prescriptions with patient and answered all questions. On-Q pump removal instructions given. Incision cleaning kit given. Follow up appointment given. Patient verbalized understanding. ID bands checked. Patient discharged home with infant via wheelchair by nursing/auxillary.    Hilbert Bible, RN
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
## 2017-10-14 NOTE — Discharge Instructions (Signed)
Please call your doctor or return to the ER if you experience any chest pains, shortness of breath, dizziness, visual changes, fever greater than 101, any heavy bleeding (saturating more than 1 pad per hour), large clots, or foul smelling discharge, any worsening abdominal pain and cramping that is not controlled by pain medication, or any signs of postpartum depression. No tampons, enemas, douches, or sexual intercourse for 6 weeks. Also avoid tub baths, hot tubs, or swimming for 6 weeks.  ° ° °Check your incision daily for any signs of infection such as redness, warmth, swelling, increased pain, or pus/foul smelling drainage ° ° °Activity: do not lift over 10 lbs for 6 weeks  °No driving for 2 weeks  °Pelvic rest for 6 weeks  °

## 2017-10-18 ENCOUNTER — Other Ambulatory Visit: Payer: Self-pay | Admitting: Obstetrics and Gynecology

## 2017-10-19 ENCOUNTER — Ambulatory Visit (INDEPENDENT_AMBULATORY_CARE_PROVIDER_SITE_OTHER): Payer: Medicaid Other | Admitting: Obstetrics and Gynecology

## 2017-10-19 ENCOUNTER — Encounter: Payer: Self-pay | Admitting: Obstetrics and Gynecology

## 2017-10-19 VITALS — BP 110/72 | HR 101 | Ht 60.0 in | Wt 219.0 lb

## 2017-10-19 DIAGNOSIS — N61 Mastitis without abscess: Secondary | ICD-10-CM

## 2017-10-19 DIAGNOSIS — Z09 Encounter for follow-up examination after completed treatment for conditions other than malignant neoplasm: Secondary | ICD-10-CM

## 2017-10-19 MED ORDER — DICLOXACILLIN SODIUM 500 MG PO CAPS: 500.0000 mg | ORAL_CAPSULE | Freq: Four times a day (QID) | ORAL | 0 refills | Status: AC

## 2017-10-19 NOTE — Progress Notes (Signed)
      Postoperative Follow-up Patient presents post op from RLTCS 1weeks ago for prior Cesarean section.  Subjective: Patient reports marked improvement in her preop symptoms. Eating a regular diet without difficulty. Pain is controlled without any medications.  Activity: normal activities of daily living.  Has noted an area on the left breast with increased tenderness, did drain.  Started out as small boil.  She was placed on what she believes was clobetasol cream by PCP  Objective: Vitals:   10/19/17 1111  BP: 110/72  Pulse: (!) 101   General: NAD HEENT: normocephalic, anicteric Pulmonary: no increased work of breathing Breast symmetrical, no tenderness, no palpable nodules or masses, no skin or nipple retraction present.  No axillary or supraclavicular lymphadenopathy.  The left breast contains a small furuncle with surrounding erythema, no discharge Abdomen: soft, non-tender, non-distended, incision D/C/I staple line.  Staples removed and incision reinforced with steri strips  Physical Exam  Pulmonary/Chest:        Assessment: 27 y.o. s/p RLTCS stable  Plan: Patient has done well after surgery with no apparent complications.  I have discussed the post-operative course to date, and the expected progress moving forward.  The patient understands what complications to be concerned about.  I will see the patient in routine follow up, or sooner if needed.    Activity plan: No heavy lifting.  - discontinue clobetasol - Rx dicloxacillin - Follow up 1 week to assess treatment response.  Given underlying diabetes most likely risk factor.  She has not been breast feeding as she was uncertain whether she could while on ibuprofen which I assured her was ok.   - If fevers, chills, or worsening expand coverage for MRSA clindamycin, if still not breast feeding may also use bactrim DS  Vena AustriaAndreas Sofiah Lyne 10/21/2017, 10:07 AM

## 2017-10-27 ENCOUNTER — Ambulatory Visit: Payer: Medicaid Other | Admitting: Obstetrics & Gynecology

## 2017-10-28 ENCOUNTER — Ambulatory Visit: Payer: Medicaid Other | Admitting: Obstetrics & Gynecology

## 2017-11-21 IMAGING — US US RENAL
1 series · 14 of 25 positions shown · non-contrast
Comparison: None.

CLINICAL DATA: Right flank pain for 3 days.

EXAM:
RENAL / URINARY TRACT ULTRASOUND COMPLETE

[Series 1: us renal · 0.26mm/px · 14 of 43 slices shown]
[im 1/43]
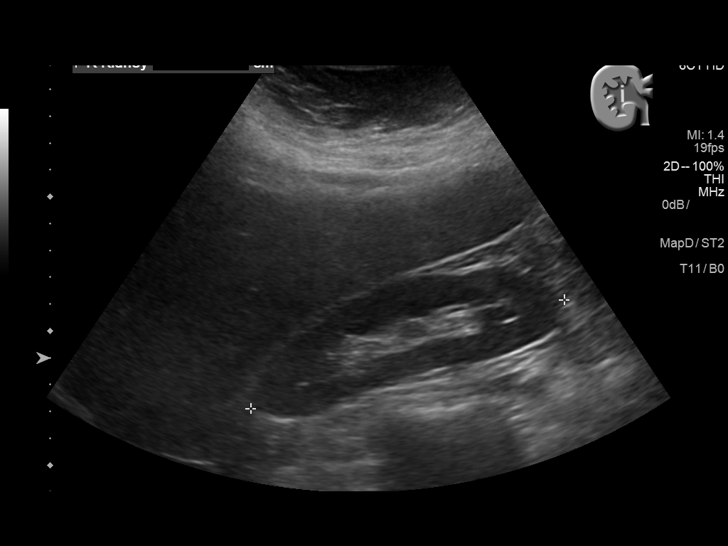
[im 4/43]
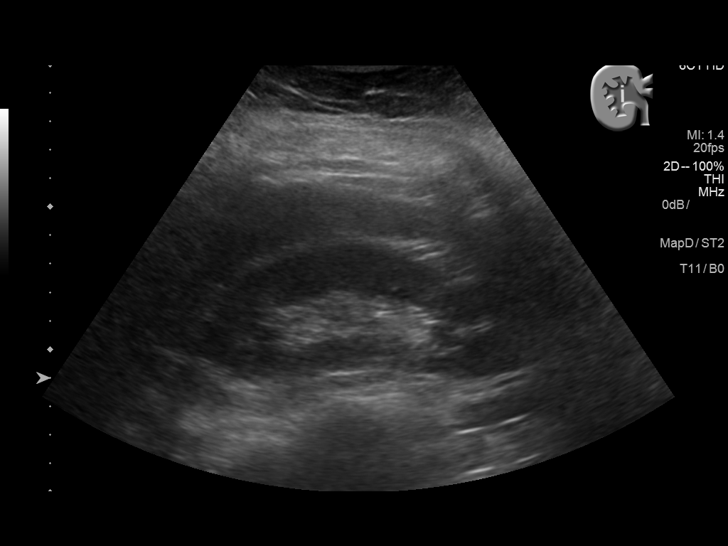
[im 8/43]
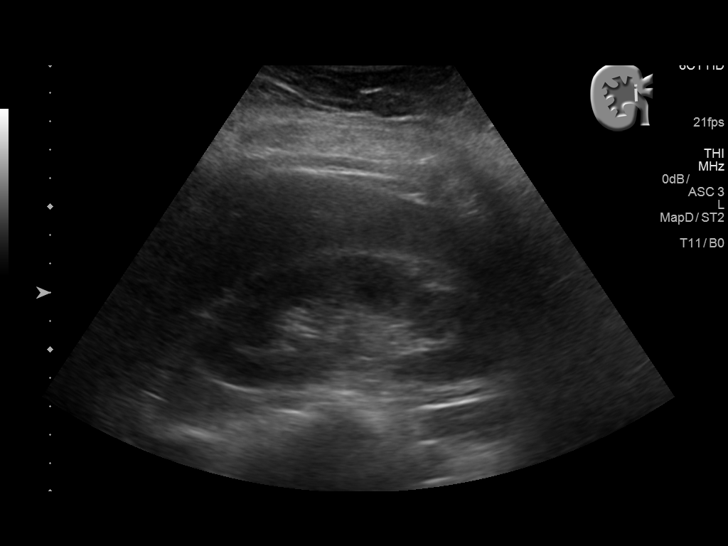
[im 11/43]
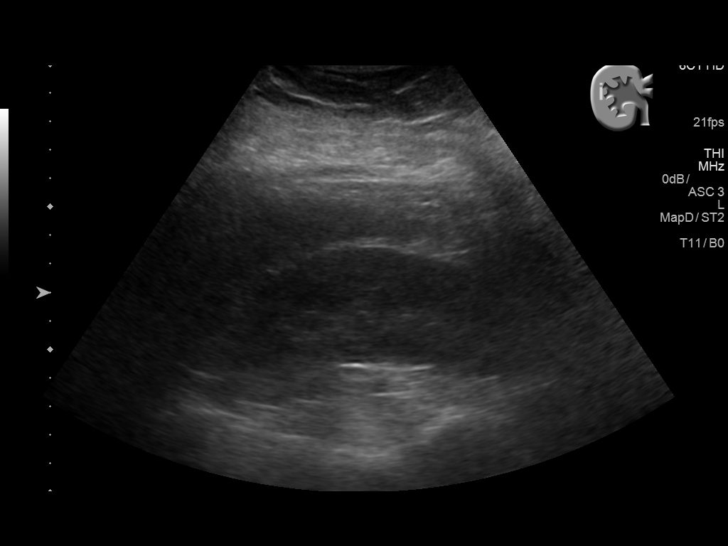
[im 15/43]
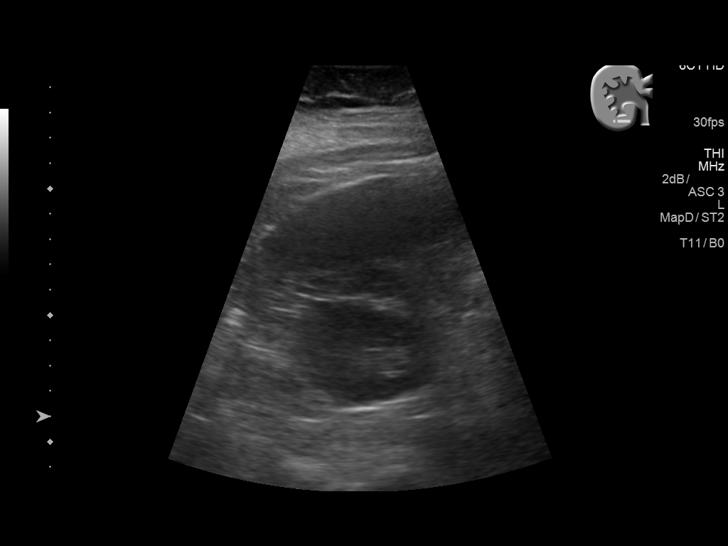
[im 16/43]
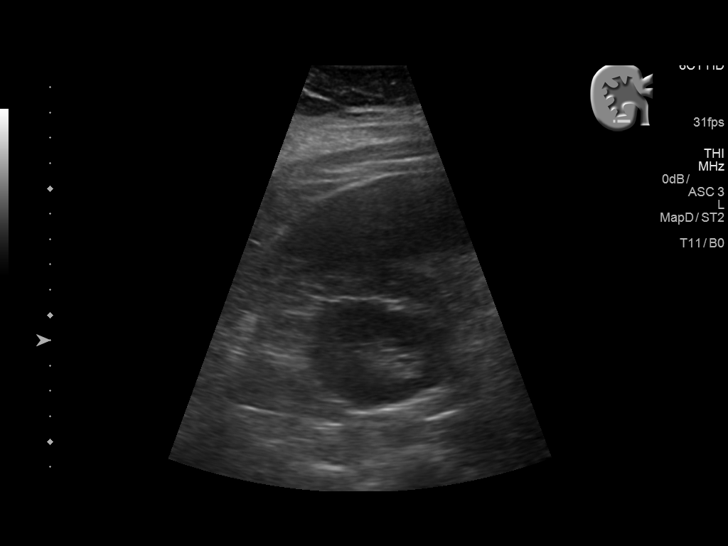
[im 20/43]
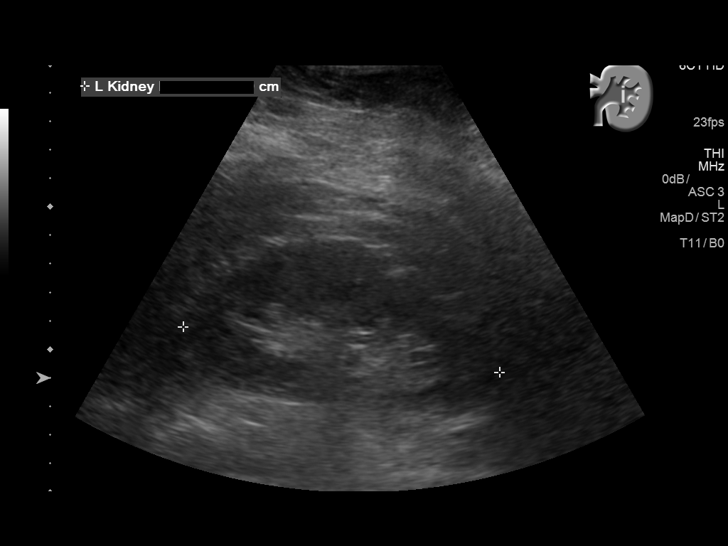
[im 23/43]
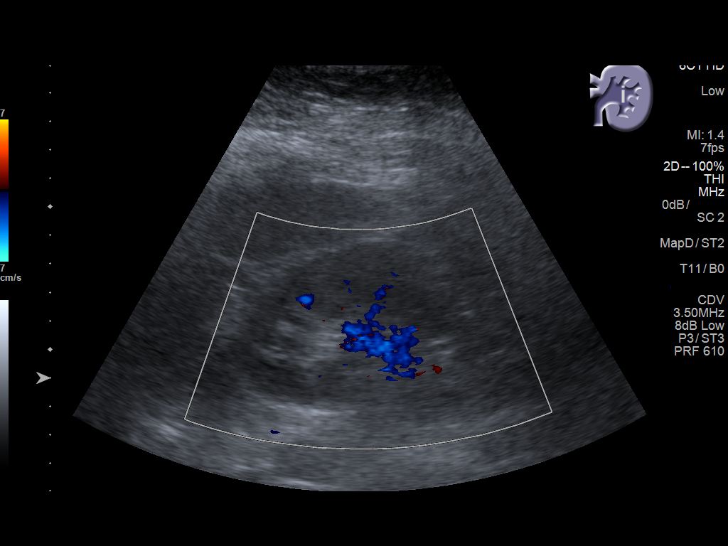
[im 27/43]
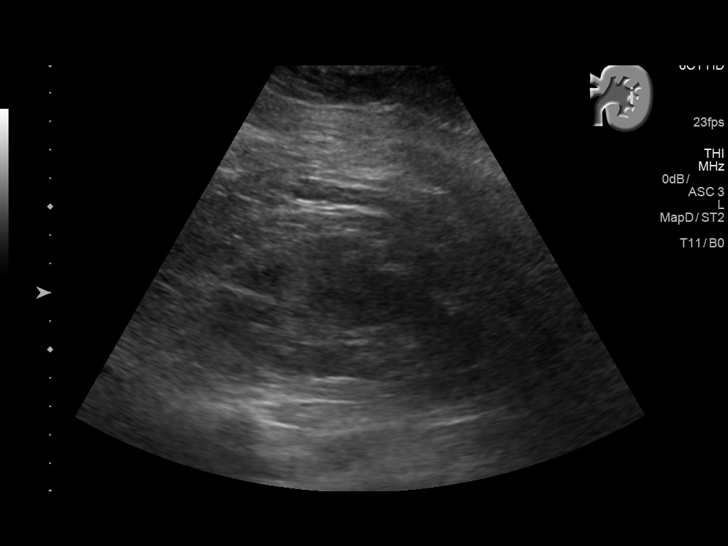
[im 29/43]
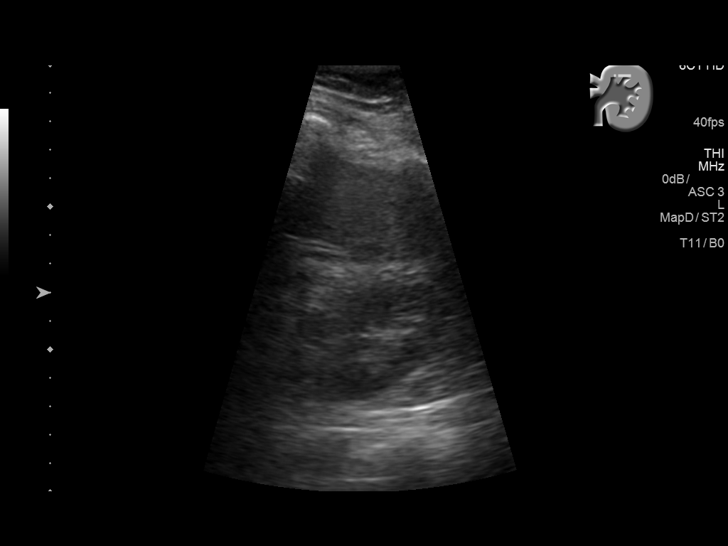
[im 32/43]
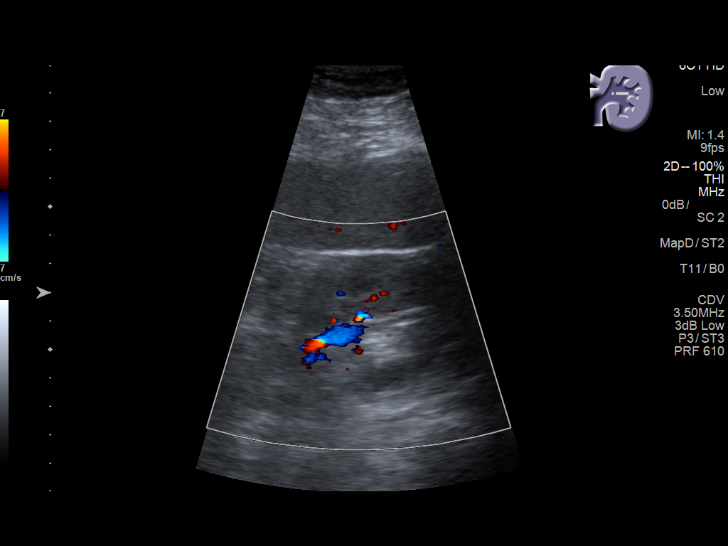
[im 36/43]
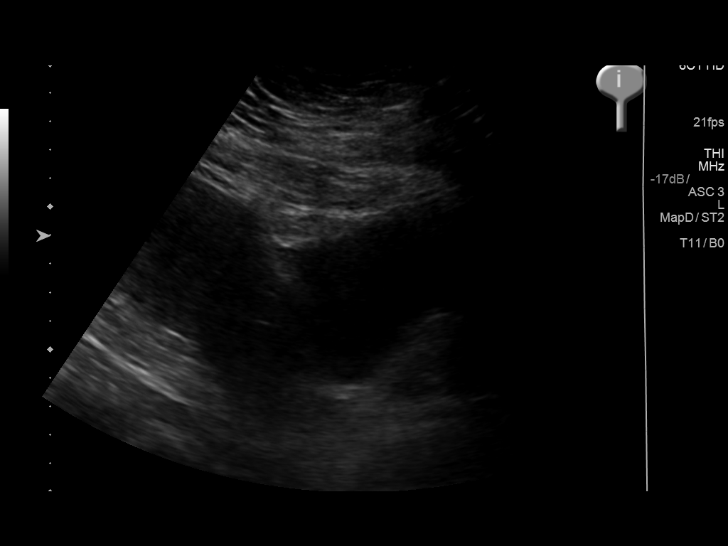
[im 39/43]
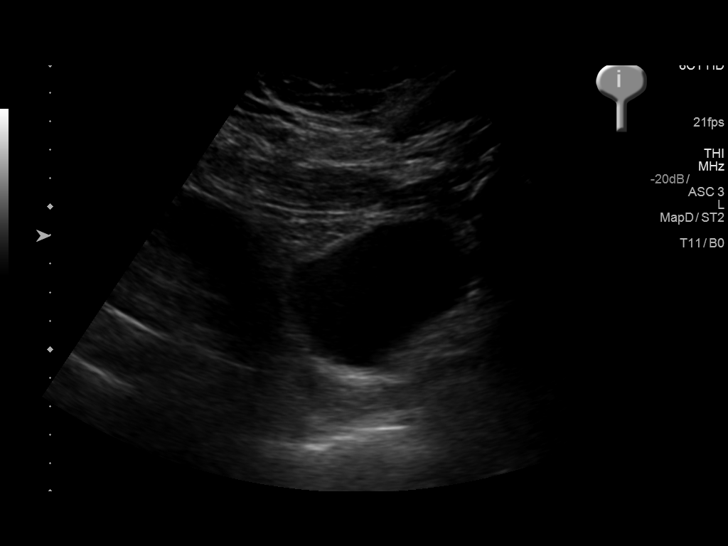
[im 43/43]
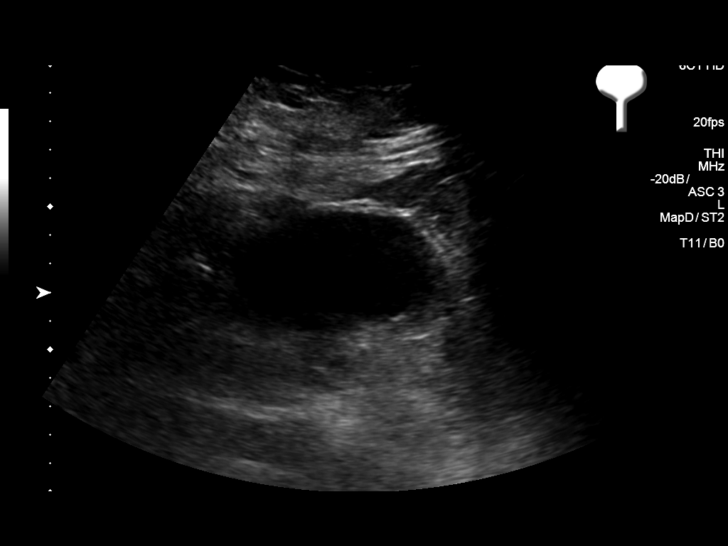

[14 of 25 positions shown; findings below may reference images not displayed]

FINDINGS: Right Kidney:

Length: 12.4 cm. Echogenicity within normal limits. No mass or
hydronephrosis visualized.

Left Kidney:

Length: 11.2 cm. Echogenicity within normal limits. No mass or
hydronephrosis visualized.

Bladder:

Appears normal for degree of bladder distention.
IMPRESSION: Normal renal ultrasound.

## 2017-11-21 IMAGING — US US ABDOMEN LIMITED
1 series · 14 of 25 positions shown · non-contrast
Comparison: None.

CLINICAL DATA: Right upper quadrant pain, worse after eating, for 3
days.

EXAM:
US ABDOMEN LIMITED - RIGHT UPPER QUADRANT

[Series 1: us abdomen limited · 0.19mm/px · 14 of 67 slices shown]
[im 1/67]
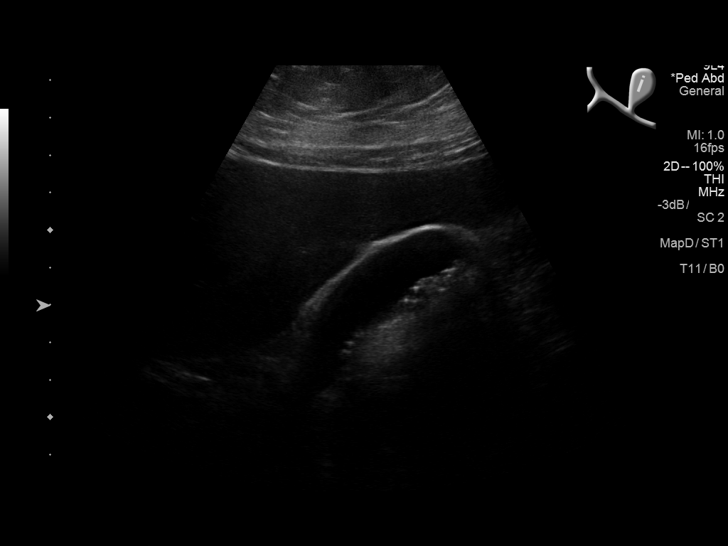
[im 6/67]
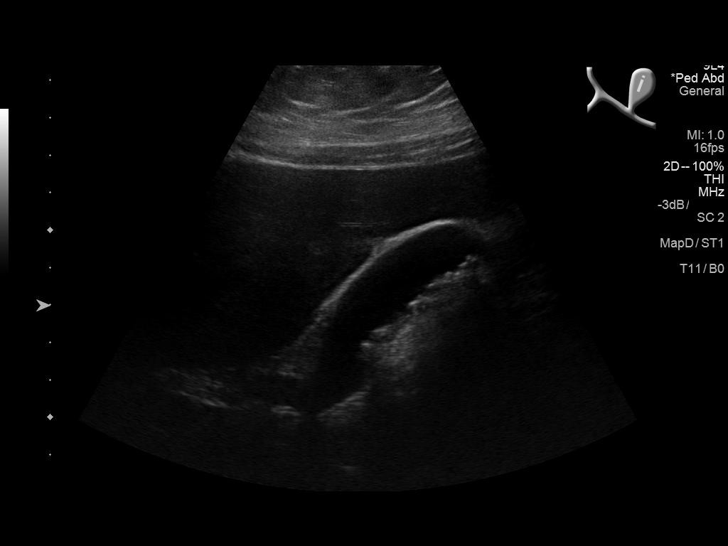
[im 12/67]
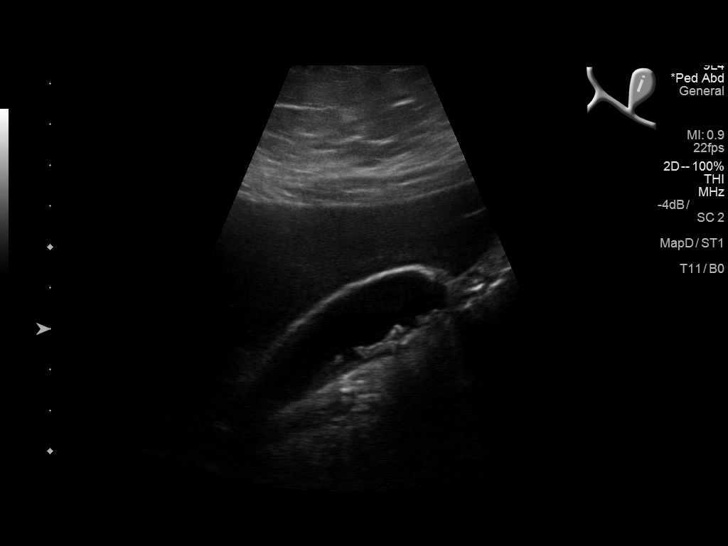
[im 17/67]
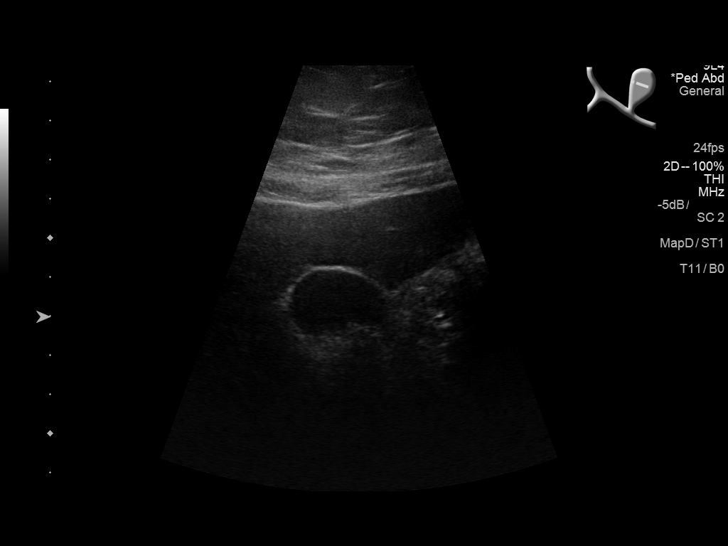
[im 23/67]
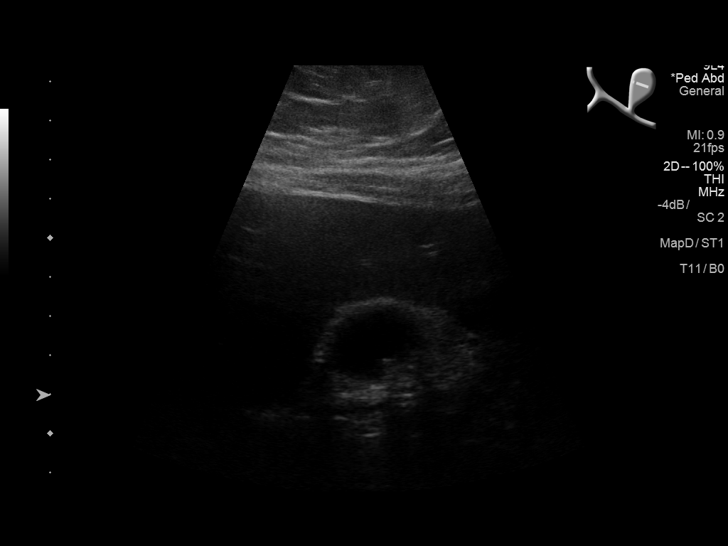
[im 25/67]
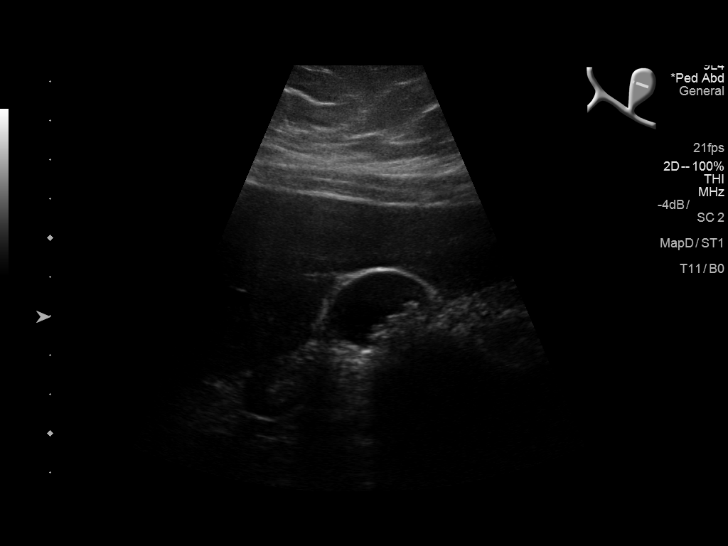
[im 31/67]
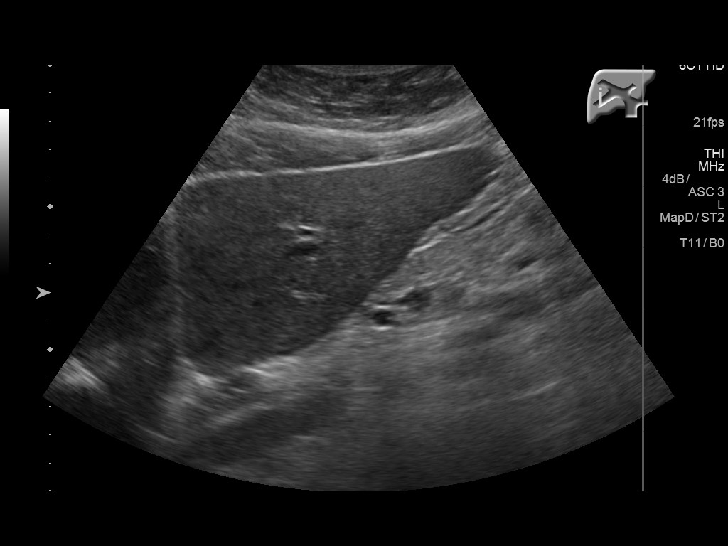
[im 36/67]
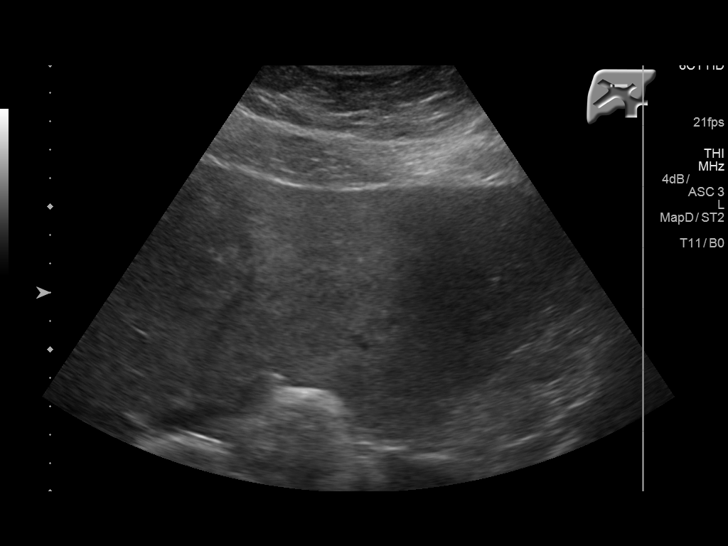
[im 42/67]
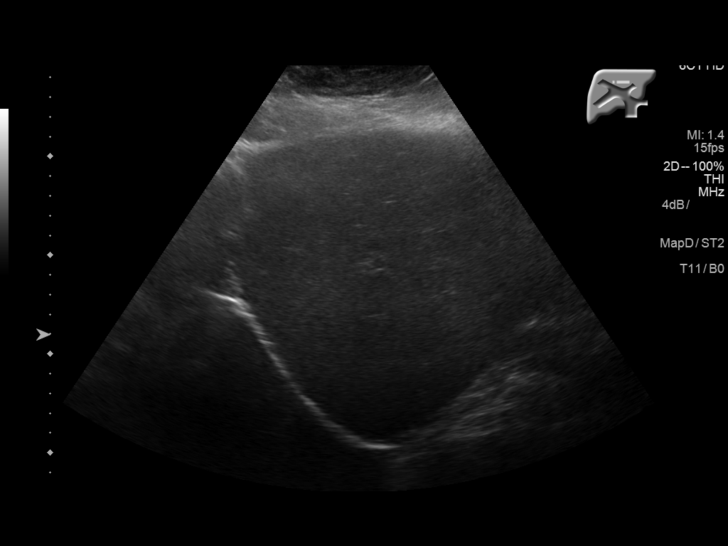
[im 45/67]
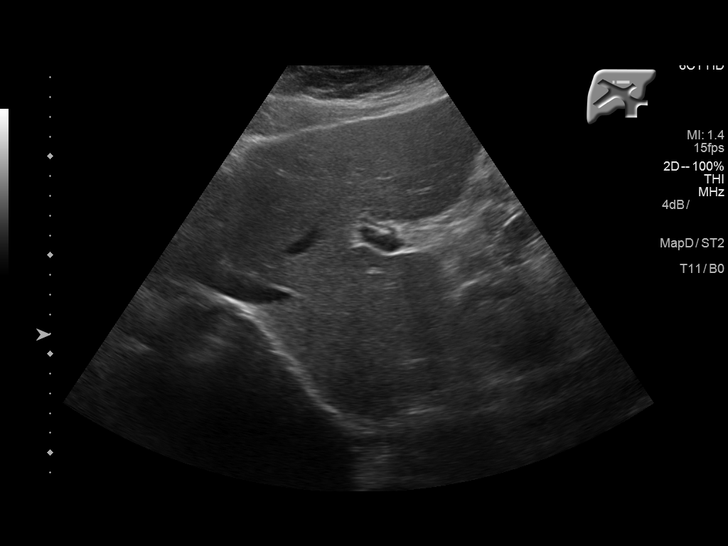
[im 50/67]
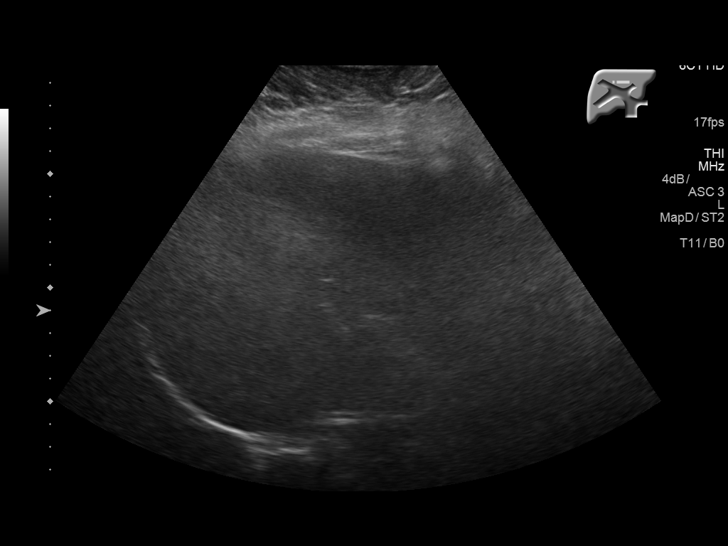
[im 56/67]
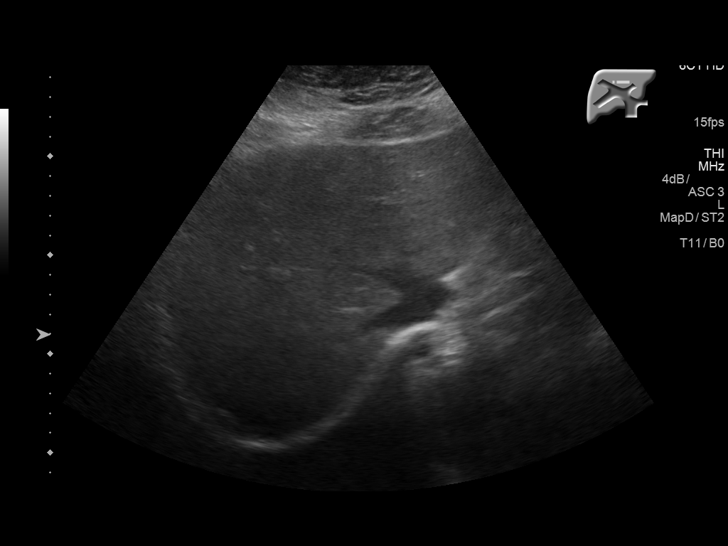
[im 61/67]
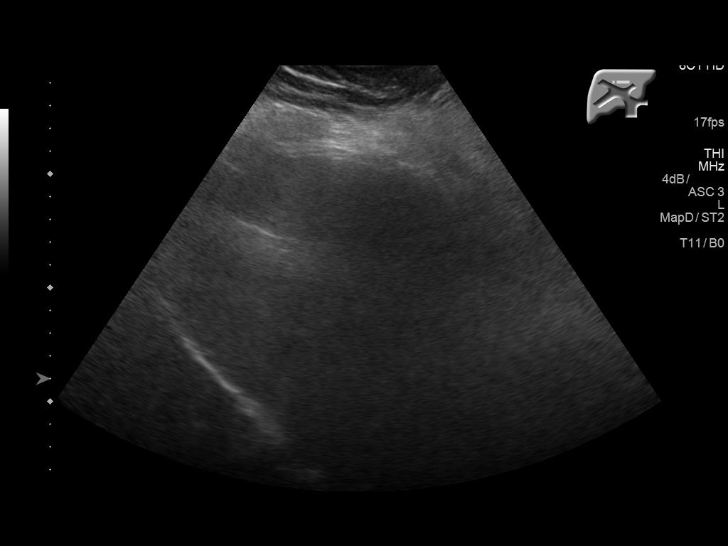
[im 67/67]
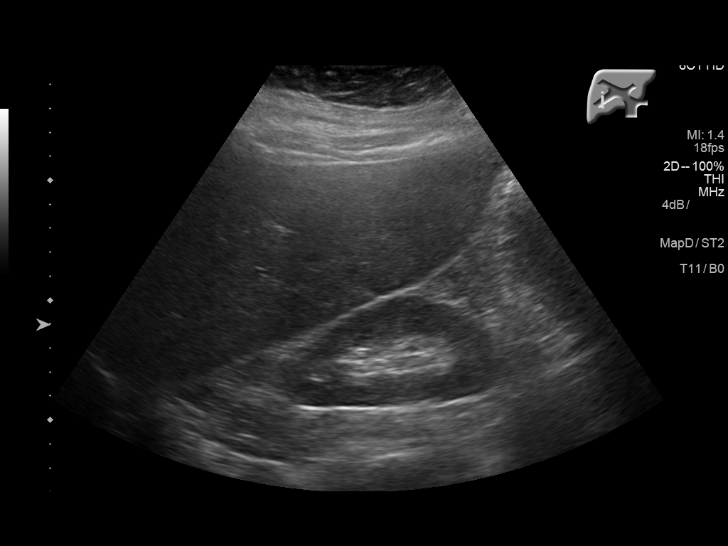

[14 of 25 positions shown; findings below may reference images not displayed]

FINDINGS: Gallbladder:

Multiple small stones layering within the gallbladder. No
gallbladder wall thickening or pericholecystic edema. No sonographic
Murphy's sign elicited, per the sonographer.

Common bile duct:

Diameter: 4 mm

Liver:

No focal lesion identified. Within normal limits in parenchymal
echogenicity.
IMPRESSION: 1. Cholelithiasis without evidence of acute cholecystitis.
2. No bile duct dilatation.
3. Overall, no acute findings.

## 2017-12-25 ENCOUNTER — Emergency Department
Admission: EM | Admit: 2017-12-25 | Discharge: 2017-12-25 | Disposition: A | Discharge status: Home / Self Care | Payer: Medicaid Other | Attending: Emergency Medicine | Admitting: Emergency Medicine

## 2017-12-25 ENCOUNTER — Emergency Department: Payer: Medicaid Other

## 2017-12-25 ENCOUNTER — Other Ambulatory Visit: Payer: Self-pay

## 2017-12-25 ENCOUNTER — Encounter: Payer: Self-pay | Admitting: Emergency Medicine

## 2017-12-25 DIAGNOSIS — M5414 Radiculopathy, thoracic region: Secondary | ICD-10-CM

## 2017-12-25 DIAGNOSIS — F17210 Nicotine dependence, cigarettes, uncomplicated: Secondary | ICD-10-CM | POA: Diagnosis not present

## 2017-12-25 DIAGNOSIS — M546 Pain in thoracic spine: Secondary | ICD-10-CM | POA: Diagnosis not present

## 2017-12-25 DIAGNOSIS — Z91040 Latex allergy status: Secondary | ICD-10-CM | POA: Diagnosis not present

## 2017-12-25 DIAGNOSIS — E119 Type 2 diabetes mellitus without complications: Secondary | ICD-10-CM | POA: Diagnosis not present

## 2017-12-25 DIAGNOSIS — M549 Dorsalgia, unspecified: Secondary | ICD-10-CM | POA: Diagnosis present

## 2017-12-25 DIAGNOSIS — J45909 Unspecified asthma, uncomplicated: Secondary | ICD-10-CM | POA: Insufficient documentation

## 2017-12-25 DIAGNOSIS — Z79899 Other long term (current) drug therapy: Secondary | ICD-10-CM | POA: Diagnosis not present

## 2017-12-25 DIAGNOSIS — G8929 Other chronic pain: Secondary | ICD-10-CM | POA: Diagnosis not present

## 2017-12-25 LAB — COMPREHENSIVE METABOLIC PANEL
ALK PHOS: 94 U/L (ref 38–126)
ALT: 33 U/L (ref 14–54)
AST: 23 U/L (ref 15–41)
Albumin: 4.2 g/dL (ref 3.5–5.0)
Anion gap: 9 (ref 5–15)
BILIRUBIN TOTAL: 0.6 mg/dL (ref 0.3–1.2)
BUN: 9 mg/dL (ref 6–20)
CALCIUM: 9.2 mg/dL (ref 8.9–10.3)
CHLORIDE: 105 mmol/L (ref 101–111)
CO2: 22 mmol/L (ref 22–32)
CREATININE: 0.65 mg/dL (ref 0.44–1.00)
Glucose, Bld: 103 mg/dL — ABNORMAL HIGH (ref 65–99)
Potassium: 3.9 mmol/L (ref 3.5–5.1)
Sodium: 136 mmol/L (ref 135–145)
Total Protein: 8.1 g/dL (ref 6.5–8.1)

## 2017-12-25 LAB — CBC
HEMATOCRIT: 41.6 % (ref 35.0–47.0)
HEMOGLOBIN: 13 g/dL (ref 12.0–16.0)
MCH: 22.1 pg — AB (ref 26.0–34.0)
MCHC: 31.4 g/dL — AB (ref 32.0–36.0)
MCV: 70.5 fL — AB (ref 80.0–100.0)
Platelets: 435 10*3/uL (ref 150–440)
RBC: 5.9 MIL/uL — AB (ref 3.80–5.20)
RDW: 19.1 % — ABNORMAL HIGH (ref 11.5–14.5)
WBC: 11.2 10*3/uL — ABNORMAL HIGH (ref 3.6–11.0)

## 2017-12-25 LAB — LIPASE, BLOOD: LIPASE: 30 U/L (ref 11–51)

## 2017-12-25 LAB — TROPONIN I

## 2017-12-25 MED ORDER — ACETAMINOPHEN 500 MG PO TABS
1000.0000 mg | ORAL_TABLET | ORAL | Status: AC
  Administered 2017-12-25: 1000 mg via ORAL
  Filled 2017-12-25: qty 2

## 2017-12-25 NOTE — ED Provider Notes (Signed)
[facility] Emergency Department Provider Note   ____________________________________________   First MD Initiated Contact with Patient 12/25/17 1524     (approximate)  I have reviewed the triage vital signs and the nursing notes.   HISTORY  Chief Complaint Back Pain    HPI Faith [name] is a 27 y.o. female notable history of hypertension, diabetes, bipolar disorder recent pregnancy, with delivery in January  Patient reports that for about 2 years now she been experiencing pain in her mid back.  Her pain is been present off and on for 2 years, is worsened when she bends forward, picks things up, and is described as a light tingling sensation in the middle of her back, points at her mid to lower thoracic back.  She reports it will occasionally radiate pain out to the left of the right that is a tingling feeling.  No numbness or weakness.  No nausea or vomiting.  No chest pain or trouble breathing.  Reports same pain for about 2 years, primary care doctor recently left the area and had been treating her with Percocet.  She reports she recently ran out of her pain medication has been using ibuprofen with little relief.  She reports that she was told to follow-up with [facility], and is hoping to get a referral to the pain center for further evaluation.  Denies pregnancy, recent pregnancy less than 2 months ago.  Currently on Depakote shot.  No pain burning or difficulty with urination.  No trouble breathing.  No history of blood clots.  Past Medical History:  Diagnosis Date  . ADHD   . Anemia    BLOOD TRANSFUSION AFTER C-SECTION ON 06-2016-2 UNITS  . Anxiety   . Asthma    WELL CONTROLLED  . Bipolar 1 disorder, depressed ([place])   . Depression   . Diabetes mellitus without complication ([place])   . Gallstones   . GERD (gastroesophageal reflux disease)   . History of blood transfusion 06/2016   RECEIVED 2 UNITS OF BLOOD AFTER C SECTION  . Migraines     . Ovarian cyst   . Seizures ([place])    LAST SEIZURE 2016  . Tubal pregnancy     Patient Active Problem List   Diagnosis Date Noted  . Encounter for maternal care for low transverse scar from previous cesarean delivery 10/12/2017  . Bipolar disorder ([place]) 10/01/2017  . History of cesarean delivery 07/12/2017  . Family history of Huntington's disease   . Asthma affecting pregnancy, antepartum 05/10/2017  . Smoking (tobacco) complicating pregnancy, unspecified trimester 05/10/2017  . Seizure disorder during pregnancy ([place]) 05/10/2017  . History of anemia 04/19/2017  . History of maternal blood transfusion, currently pregnant 04/19/2017  . Supervision of high risk pregnancy, antepartum 03/02/2017  . Diabetes mellitus during pregnancy, antepartum 01/16/2016  . ADD (attention deficit disorder) 07/17/2015  . Post traumatic stress disorder (PTSD) 06/16/2013  . Depression 06/15/2013  . Suicidal ideation 06/15/2013  . Status epilepticus ([place]) 06/14/2013  . Diabetes mellitus type 2 in obese ([place]) 06/14/2013    Past Surgical History:  Procedure Laterality Date  . CESAREAN SECTION N/A 06/22/2016   Procedure: CESAREAN SECTION;  Surgeon: Malachy Mood, MD;  Location: [facility] ORS;  Service: Obstetrics;  Laterality: N/A;  . CESAREAN SECTION N/A 10/12/2017   Procedure: CESAREAN SECTION;  Surgeon: Malachy Mood, MD;  Location: [facility] ORS;  Service: Obstetrics;  Laterality: N/A;  . CHOLECYSTECTOMY N/A 08/13/2016   Procedure: LAPAROSCOPIC CHOLECYSTECTOMY;  Surgeon: Jules Husbands,  MD;  Location: [facility] ORS;  Service: General;  Laterality: N/A;  . DILATION AND CURETTAGE OF UTERUS    . ovarian cyst removed    . SALPINGECTOMY Right 2013   ectopic pregnancy. PER PATIENT, STILL HAS BOTH TUBES  . TONSILLECTOMY    . TONSILLECTOMY AND ADENOIDECTOMY      Prior to Admission medications   Medication Sig Start Date End Date Taking? Authorizing Provider  ACCU-CHEK FASTCLIX LANCETS MISC 1 Units by Percutaneous  route 4 (four) times daily. 05/24/17   Malachy Mood, MD  acetaminophen (TYLENOL) 500 MG tablet Take 500 mg by mouth every 6 (six) hours as needed for moderate pain or headache.    [provider]  albuterol (PROVENTIL HFA;VENTOLIN HFA) 108 (90 BASE) MCG/ACT inhaler Inhale 2 puffs into the lungs every 6 (six) hours as needed. For shortness of breath    [provider]  amphetamine-dextroamphetamine (ADDERALL XR) 10 MG 24 hr capsule Take 10 mg by mouth daily.     [provider]  blood glucose meter kit and supplies KIT Dispense based on patient and insurance preference. Use up to four times daily as directed. (FOR ICD-9 250.00, 250.01). 06/18/15   Ahmed Prima, MD  Blood Glucose Monitoring Suppl (ACCU-CHEK NANO SMARTVIEW) w/Device KIT 1 kit by Subdermal route as directed. Check blood sugars for fasting, and two hours after breakfast, lunch and dinner (4 checks daily) 05/24/17   Malachy Mood, MD  clonazePAM (KLONOPIN) 1 MG tablet Take 1 mg by mouth 2 (two) times daily.    [provider]  clotrimazole (LOTRIMIN) 1 % cream Apply 1 application topically daily as needed (boils).    [provider]  ferrous sulfate 325 (65 FE) MG tablet Take 325 mg by mouth 2 (two) times daily.    [provider]  FLUoxetine (PROZAC) 20 MG tablet Take 20 mg by mouth daily. PATIENT CURRENTLY NOT TAKING DUE TO NAUSEA AND VOMITING WHILE PREGNANT    [provider]  glucose blood (ACCU-CHEK SMARTVIEW) test strip Use as instructed to check blood sugars 05/24/17   Malachy Mood, MD  glyBURIDE (DIABETA) 2.5 MG tablet Take 1 tablet (2.5 mg total) by mouth 2 (two) times daily with a meal. Patient taking differently: Take 2.5 mg by mouth daily.  04/09/17   Gae Dry, MD  ibuprofen (ADVIL,MOTRIN) 600 MG tablet Take 1 tablet (600 mg total) by mouth every 6 (six) hours as needed. 10/14/17   Rexene Agent, CNM  medroxyPROGESTERone (DEPO-PROVERA) 150  MG/ML injection Inject 1 mL (150 mg total) into the muscle every 3 (three) months. 10/14/17   Rexene Agent, CNM  oxyCODONE (ROXICODONE) 5 MG immediate release tablet Take 1 tablet (5 mg total) by mouth every 4 (four) hours as needed for severe pain. 10/14/17   Rexene Agent, CNM  ranitidine (ZANTAC) 150 MG tablet Take 1 tablet (150 mg total) by mouth at bedtime. 06/03/17   Will Bonnet, MD  simethicone (MYLICON) 80 MG chewable tablet Chew 1 tablet (80 mg total) by mouth 3 (three) times daily after meals. 10/14/17   Rexene Agent, CNM    Allergies Ivp dye [iodinated diagnostic agents]; Latex; Metrizamide; Morphine; Mushroom extract complex; Sulfasalazine; Tomato; Toradol [ketorolac tromethamine]; Aspirin; Dilaudid [hydromorphone hcl]; Lamictal [lamotrigine]; Sulfa antibiotics; Tramadol; Adhesive [tape]; and Iohexol  Family History  Problem Relation Age of Onset  . Heart disease Father   . Huntington's disease Mother     Social History Social History   Tobacco  Use  . Smoking status: Current Every Day Smoker    Packs/day: 0.50    Years: 3.00    Pack years: 1.50    Types: Cigarettes  . Smokeless tobacco: Never Used  Substance Use Topics  . Alcohol use: No    Alcohol/week: 0.0 oz  . Drug use: No    Review of Systems Constitutional: No fever/chills Eyes: No visual changes. ENT: No sore throat. Cardiovascular: Denies chest pain. Respiratory: Denies shortness of breath. Gastrointestinal: No abdominal pain.  No nausea, no vomiting.  No diarrhea.  No constipation. Genitourinary: Negative for dysuria. Musculoskeletal: See HPI.  No pain in the neck or lower back.  Fall or injury.  Pain is been present since having her gallbladder removed about 2 years ago.  Denies abdominal pain. Skin: Negative for rash. Neurological: Negative for headaches, focal weakness or numbness.    ____________________________________________   PHYSICAL EXAM:  VITAL SIGNS: ED Triage Vitals   Enc Vitals Group     BP 12/25/17 1316 115/78     Pulse Rate 12/25/17 1316 77     Resp 12/25/17 1316 20     Temp 12/25/17 1316 98.3 F (36.8 C)     Temp Source 12/25/17 1316 Oral     SpO2 12/25/17 1316 96 %     Weight 12/25/17 1318 210 lb (95.3 kg)     Height 12/25/17 1318 5' (1.524 m)     Head Circumference --      Peak Flow --      Pain Score 12/25/17 1318 10     Pain Loc --      Pain Edu? --      Excl. in [place]? --     Constitutional: Alert and oriented. Well appearing and in no acute distress. Eyes: Conjunctivae are normal. Head: Atraumatic. Nose: No congestion/rhinnorhea. Mouth/Throat: Mucous membranes are moist. Neck: No stridor.   Cardiovascular: Normal rate, regular rhythm. Grossly normal heart sounds.  Good peripheral circulation. Respiratory: Normal respiratory effort.  No retractions. Lungs CTAB. Gastrointestinal: Soft and nontender. No distention. Musculoskeletal: No lower extremity tenderness nor edema.  Walks with normal gait.  Normal strength in all extremities.  No numbness or weakness in the extremities.  Reports tenderness in the mid to lower thoracic back midline without step-off.  Also reports some discomfort in the paraspinous muscles in this region.  Denies pain in cervical or lumbar region. Neurologic:  Normal speech and language. No gross focal neurologic deficits are appreciated.  Skin:  Skin is warm, dry and intact. No rash noted. Psychiatric: Mood and affect are normal. Speech and behavior are normal.  ____________________________________________   LABS (all labs ordered are listed, but only abnormal results are displayed)  Labs Reviewed  CBC - Abnormal; Notable for the following components:      Result Value   WBC 11.2 (*)    RBC 5.90 (*)    MCV 70.5 (*)    MCH 22.1 (*)    MCHC 31.4 (*)    RDW 19.1 (*)    All other components within normal limits  COMPREHENSIVE METABOLIC PANEL - Abnormal; Notable for the following components:   Glucose, Bld  103 (*)    All other components within normal limits  TROPONIN I  LIPASE, BLOOD   ____________________________________________  EKG  Reviewed enterotomy at 1330 Heart rate 80 QRS 80 QTC 420 Normal sinus rhythm, no evidence of ischemia. ____________________________________________  RADIOLOGY  Dg Chest 2 View  Result Date: 12/25/2017 CLINICAL DATA:  Chronic posterior  chest pain for 2 years, prior cholecystectomy, history asthma, diabetes mellitus, smoker EXAM: CHEST - 2 VIEW COMPARISON:  11/18/2016 FINDINGS: Normal heart size, mediastinal contours, and pulmonary vascularity. Minimal chronic peribronchial thickening. No acute infiltrate, pleural effusion or pneumothorax. Bones unremarkable. IMPRESSION: Minimal chronic peribronchial thickening which could reflect bronchitis or asthma. No acute infiltrate. Electronically Signed   By: Lavonia Dana M.D.   On: 12/25/2017 14:25   Chest x-ray reviewed some chronic peribronchial thickening. ____________________________________________   PROCEDURES  Procedure(s) performed: None  Procedures  Critical Care performed: No  ____________________________________________   INITIAL IMPRESSION / ASSESSMENT AND PLAN / ED COURSE  Pertinent labs & imaging results that were available during my care of the patient were reviewed by me and considered in my medical decision making (see chart for details).  Patient returns for evaluation of back pain, relates that the story is been going on for about 2 years treated with Percocet as an outpatient but she recently could not have this continued.  She reports ibuprofen does not help with much pain relief.  Very reassuring exam, no back plane red flags for denoted.  She has been followed by a previous doctor, when I discussed that I suspect that there is some type of possible radicular pain she reports that Dr. Gwynneth Aliment had told her the same that she suspect it was radicular.  Denies associated abdominal  symptoms.  Reassuring labs.  Stable hemodynamics.  No neurologic deficits.  No trauma.  No associated neurologic symptoms.  Denies pregnancy.  No associated cardiac or pulmonary symptoms.  EKG and troponin normal.  Discussed with patient, I have placed a referral to the pain clinic for her.  Discussed with her that she will need to consult primary care at [place] and/or pain management clinic for prescription pain medications, she is understanding and agreeable with this plan.  Return precautions and treatment recommendations and follow-up discussed with the patient who is agreeable with the plan.       ____________________________________________   FINAL CLINICAL IMPRESSION(S) / ED DIAGNOSES  Final diagnoses:  Chronic midline thoracic back pain  Radicular pain of thoracic region      NEW MEDICATIONS STARTED DURING THIS VISIT:  New Prescriptions   No medications on file     Note:  This document was prepared using Dragon voice recognition software and may include unintentional dictation errors.     Delman Kitten, MD 12/25/17 [phone]

## 2017-12-25 NOTE — ED Triage Notes (Signed)
Mid back pain radiates around both sides. States began 2 years ago after had her gall bladder out. States hurts most after eating.

## 2017-12-25 NOTE — Discharge Instructions (Addendum)
° °  Please follow up with your doctor as soon as possible regarding today's ED visit and your back pain.  Return to the ED for worsening back pain, fever, weakness or numbness of either leg, or if you develop either (1) an inability to urinate or have bowel movements, or (2) loss of your ability to control your bathroom functions (if you start having "accidents"), or if you develop other new symptoms that concern you. ° °

## 2018-01-12 ENCOUNTER — Other Ambulatory Visit: Payer: Self-pay | Admitting: Family Medicine

## 2018-01-12 ENCOUNTER — Ambulatory Visit
Admission: RE | Admit: 2018-01-12 | Discharge: 2018-01-12 | Disposition: A | Discharge status: Home / Self Care | Payer: Medicaid Other | Source: Ambulatory Visit | Attending: Family Medicine | Admitting: Family Medicine

## 2018-01-12 ENCOUNTER — Emergency Department
Admission: EM | Admit: 2018-01-12 | Discharge: 2018-01-12 | Disposition: A | Discharge status: Home / Self Care | Payer: Medicaid Other

## 2018-01-12 DIAGNOSIS — M79662 Pain in left lower leg: Secondary | ICD-10-CM | POA: Diagnosis not present

## 2018-01-12 NOTE — ED Notes (Signed)
Called by Chelsea PrimusAshley Ross with no response.

## 2018-01-12 NOTE — ED Notes (Signed)
Final call for pt, no response, searched lobby including lobby bathroom.

## 2018-01-12 NOTE — ED Notes (Signed)
Called for patient at 1143 with no response.

## 2018-01-28 ENCOUNTER — Other Ambulatory Visit: Payer: Self-pay | Admitting: Family Medicine

## 2018-01-28 ENCOUNTER — Ambulatory Visit
Admission: RE | Admit: 2018-01-28 | Discharge: 2018-01-28 | Disposition: A | Discharge status: Home / Self Care | Payer: Medicaid Other | Source: Ambulatory Visit | Attending: Family Medicine | Admitting: Family Medicine

## 2018-01-28 DIAGNOSIS — M419 Scoliosis, unspecified: Secondary | ICD-10-CM | POA: Diagnosis not present

## 2018-01-28 DIAGNOSIS — M549 Dorsalgia, unspecified: Secondary | ICD-10-CM | POA: Diagnosis present

## 2018-01-28 DIAGNOSIS — M2578 Osteophyte, vertebrae: Secondary | ICD-10-CM | POA: Diagnosis not present

## 2018-02-17 IMAGING — CT CT RENAL STONE PROTOCOL
2 of 4 series · 16 of 46 positions shown, 18 images · non-contrast
Comparison: CT of the abdomen and pelvis from 05/17/2014, and right
upper quadrant ultrasound performed 08/21/2016

CLINICAL DATA: Acute onset of mid chest pain. Nausea and vomiting.
Shortness of breath. Initial encounter.

EXAM:
CT ABDOMEN AND PELVIS WITHOUT CONTRAST
TECHNIQUE: Multidetector CT imaging of the abdomen and pelvis was performed
following the standard protocol without IV contrast.

[Series 2: stone full standard · axial · 0.87mm/px · z∈[-382,+58]mm · 13 of 98 slices shown, 15 images]
[im 5/98  soft-tissue]
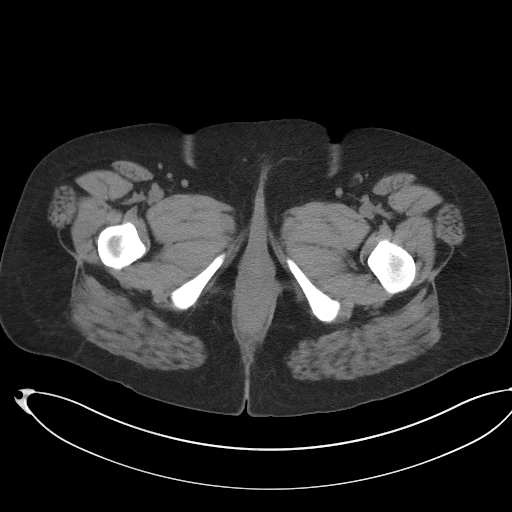
[im 5/98  bone]
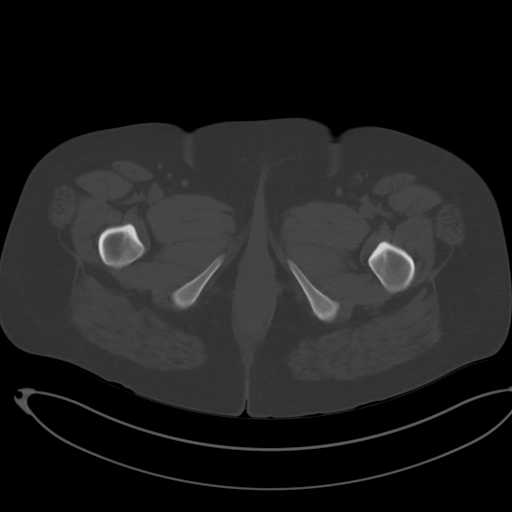
[im 13/98  soft-tissue]
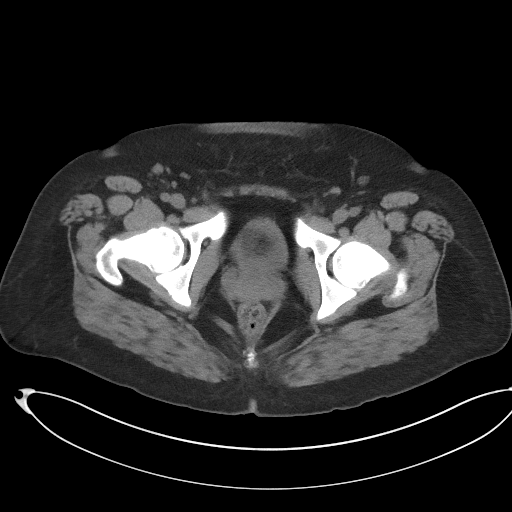
[im 22/98  soft-tissue]
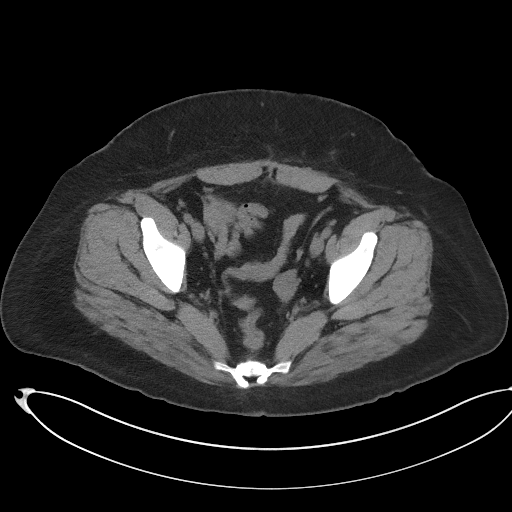
[im 26/98  soft-tissue]
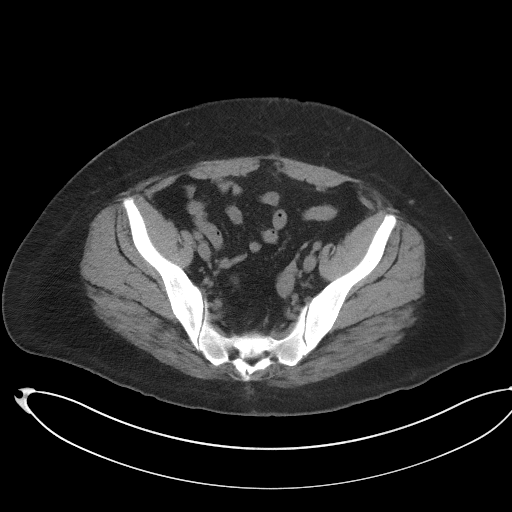
[im 34/98  soft-tissue]
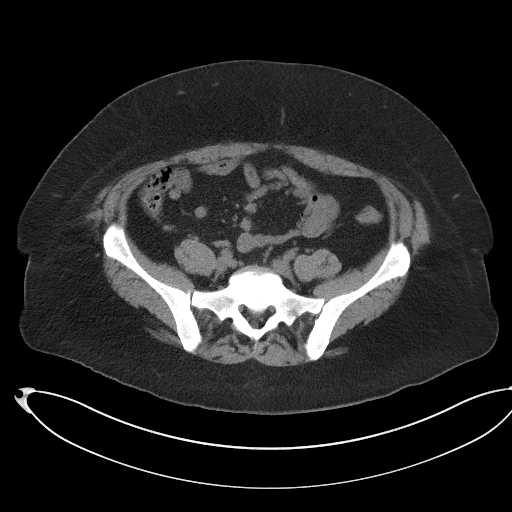
[im 43/98  soft-tissue]
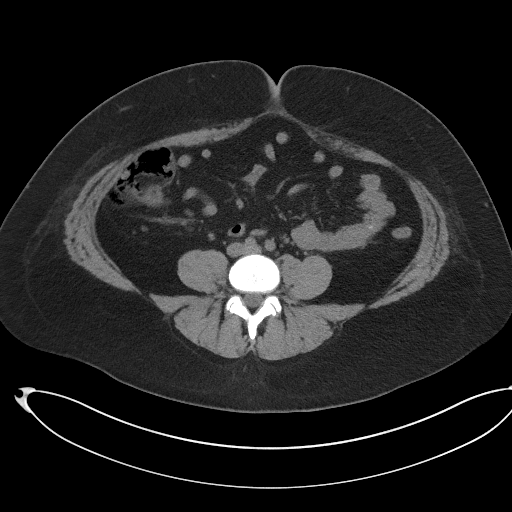
[im 51/98  soft-tissue]
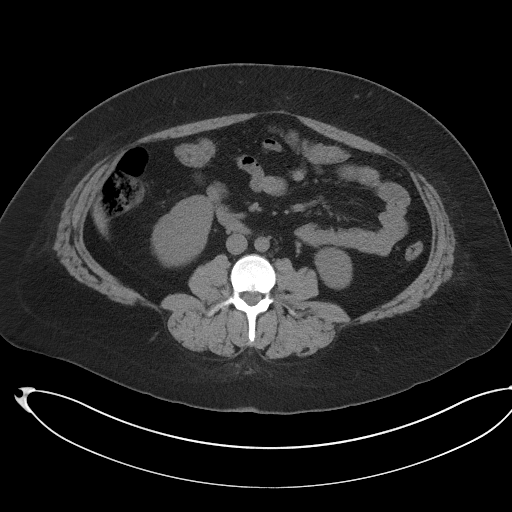
[im 55/98  soft-tissue]
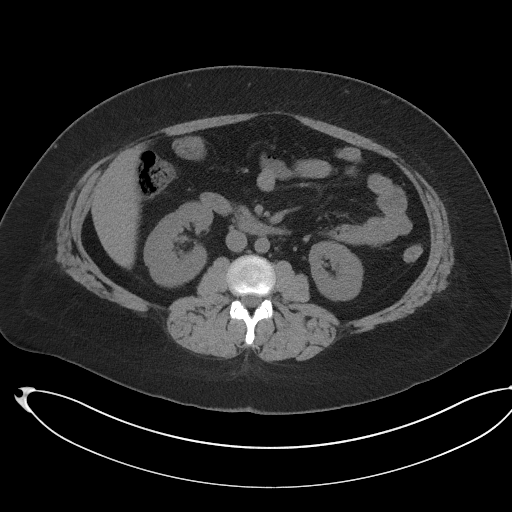
[im 64/98  soft-tissue]
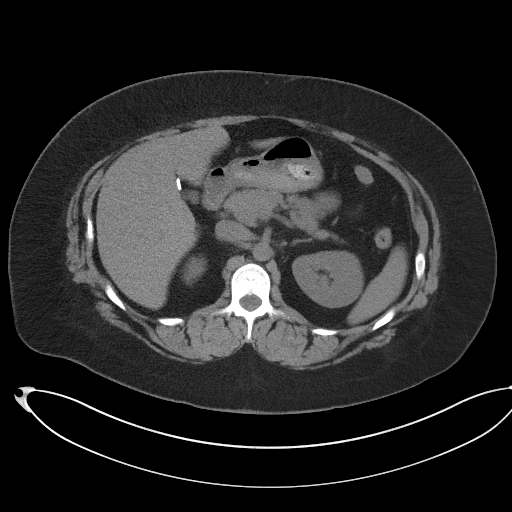
[im 64/98  bone]
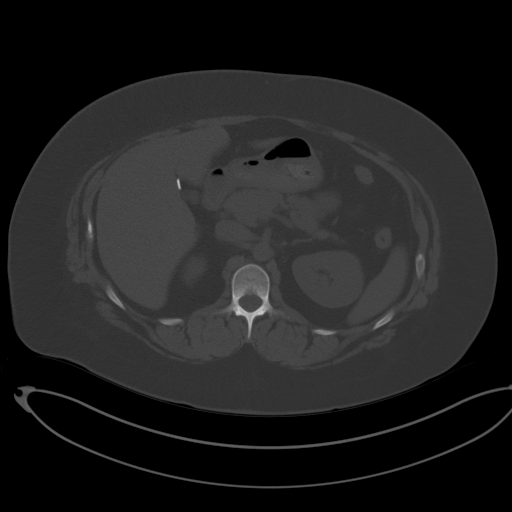
[im 72/98  soft-tissue]
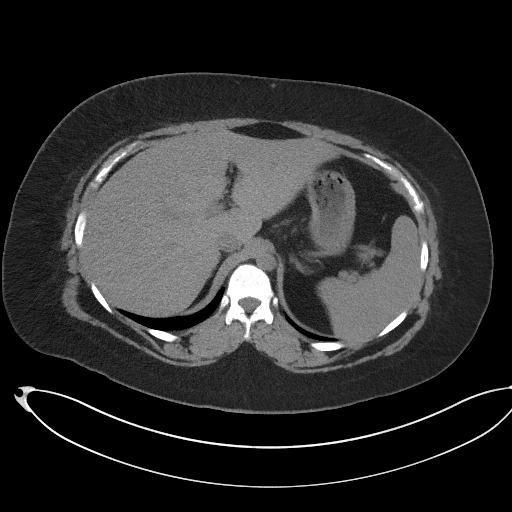
[im 76/98  soft-tissue]
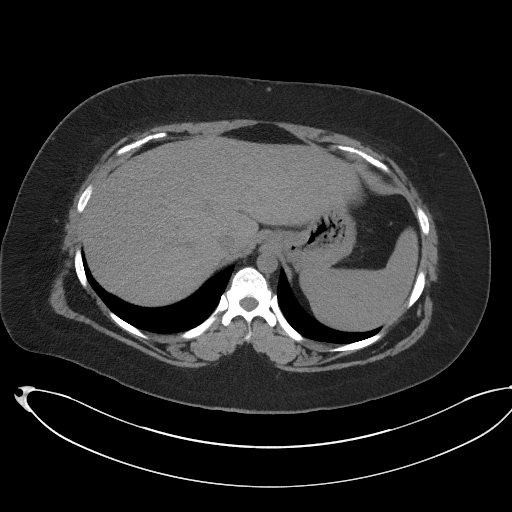
[im 85/98  soft-tissue]
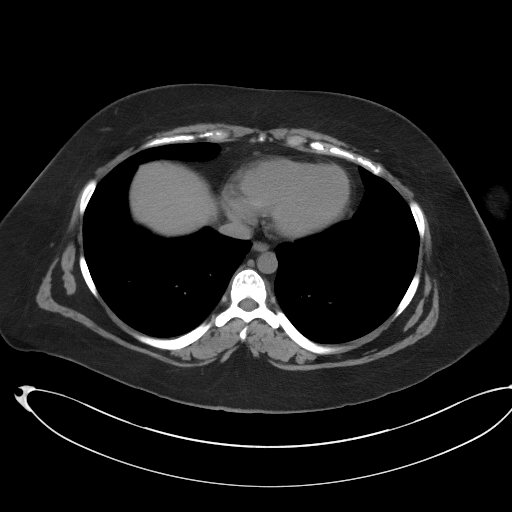
[im 93/98  soft-tissue]
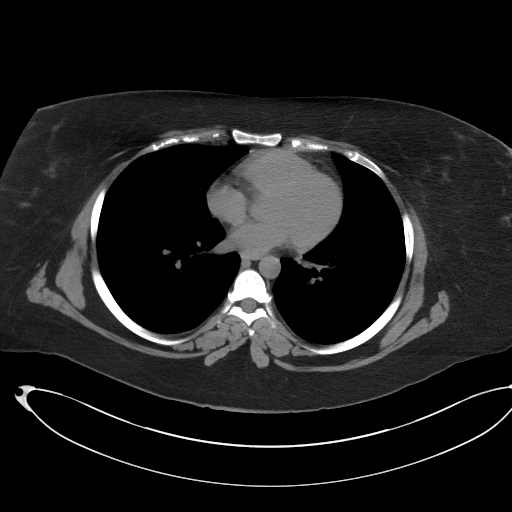

[Series 5: coronal · coronal · 0.90mm/px · 3 of 155 slices shown]
[im 52/155  soft-tissue]
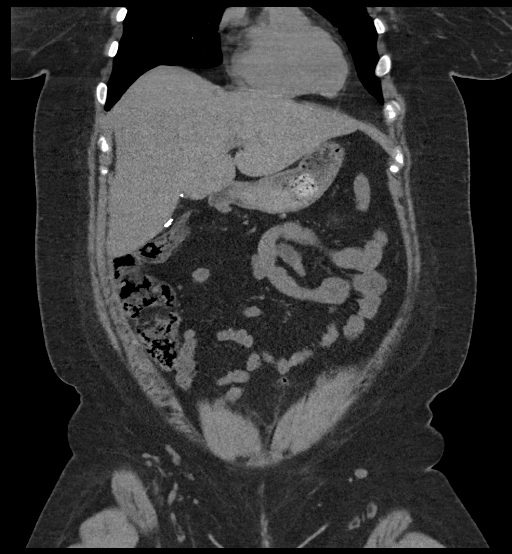
[im 69/155  soft-tissue]
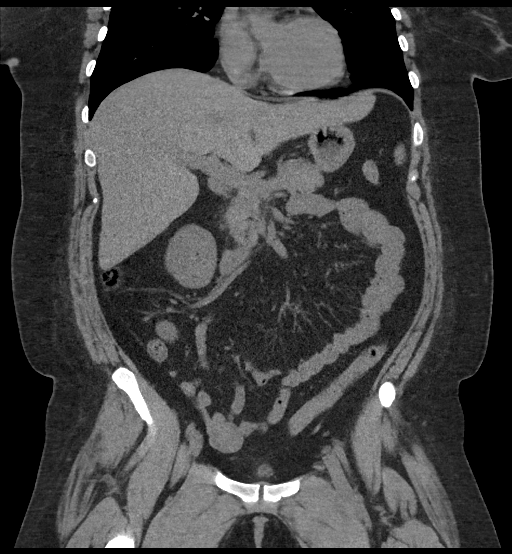
[im 86/155  soft-tissue]
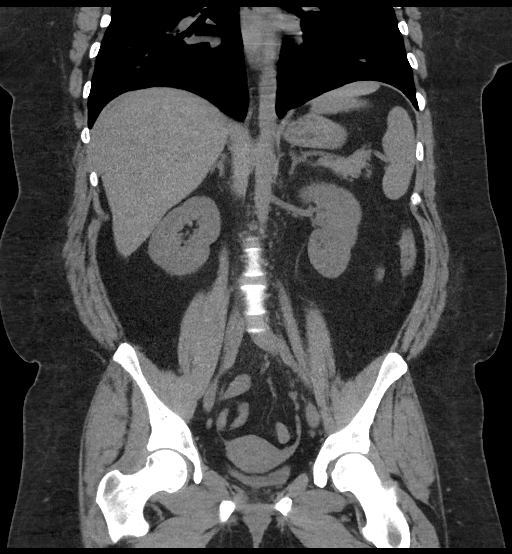

[16 of 46 positions shown; findings below may reference images not displayed]

FINDINGS: Lower chest: The visualized lung bases are grossly clear. The
visualized portions of the mediastinum are unremarkable.

Hepatobiliary: The liver is unremarkable in appearance. The patient
is status post cholecystectomy, with clips noted at the gallbladder
fossa. The common bile duct remains normal in caliber.

Pancreas: The pancreas is within normal limits.

Spleen: The spleen is unremarkable in appearance.

Adrenals/Urinary Tract: The adrenal glands are unremarkable in
appearance. The kidneys are within normal limits. There is no
evidence of hydronephrosis. No renal or ureteral stones are
identified. No perinephric stranding is seen.

Stomach/Bowel: The stomach is unremarkable in appearance. The small
bowel is within normal limits. The appendix is normal in caliber,
without evidence of appendicitis. The colon is unremarkable in
appearance.

Vascular/Lymphatic: The abdominal aorta is unremarkable in
appearance. The inferior vena cava is grossly unremarkable. No
retroperitoneal lymphadenopathy is seen. No pelvic sidewall
lymphadenopathy is identified.

Reproductive: The bladder is decompressed and not well assessed. The
uterus is unremarkable in appearance. The right ovary is mildly
larger than the left ovary, but likely still within normal limits.
No suspicious adnexal masses are seen.

Other: No additional soft tissue abnormalities are seen.

Musculoskeletal: No acute osseous abnormalities are identified.
Vacuum phenomenon at L5-S1 likely remains within normal limits. The
visualized musculature is unremarkable in appearance.
IMPRESSION: No acute abnormality seen within the abdomen or pelvis.

## 2018-02-17 IMAGING — CR DG CHEST 2V
1 series · 2 of 2 positions shown · non-contrast
Comparison: 08/17/2014

CLINICAL DATA: Mid chest pain for 1 week. Nausea and vomiting.
Shortness of breath.

EXAM:
CHEST  2 VIEW

[Series 1: dg chest 2 view · 0.14mm/px · 2 of 2 slices shown]
[im 1/2]
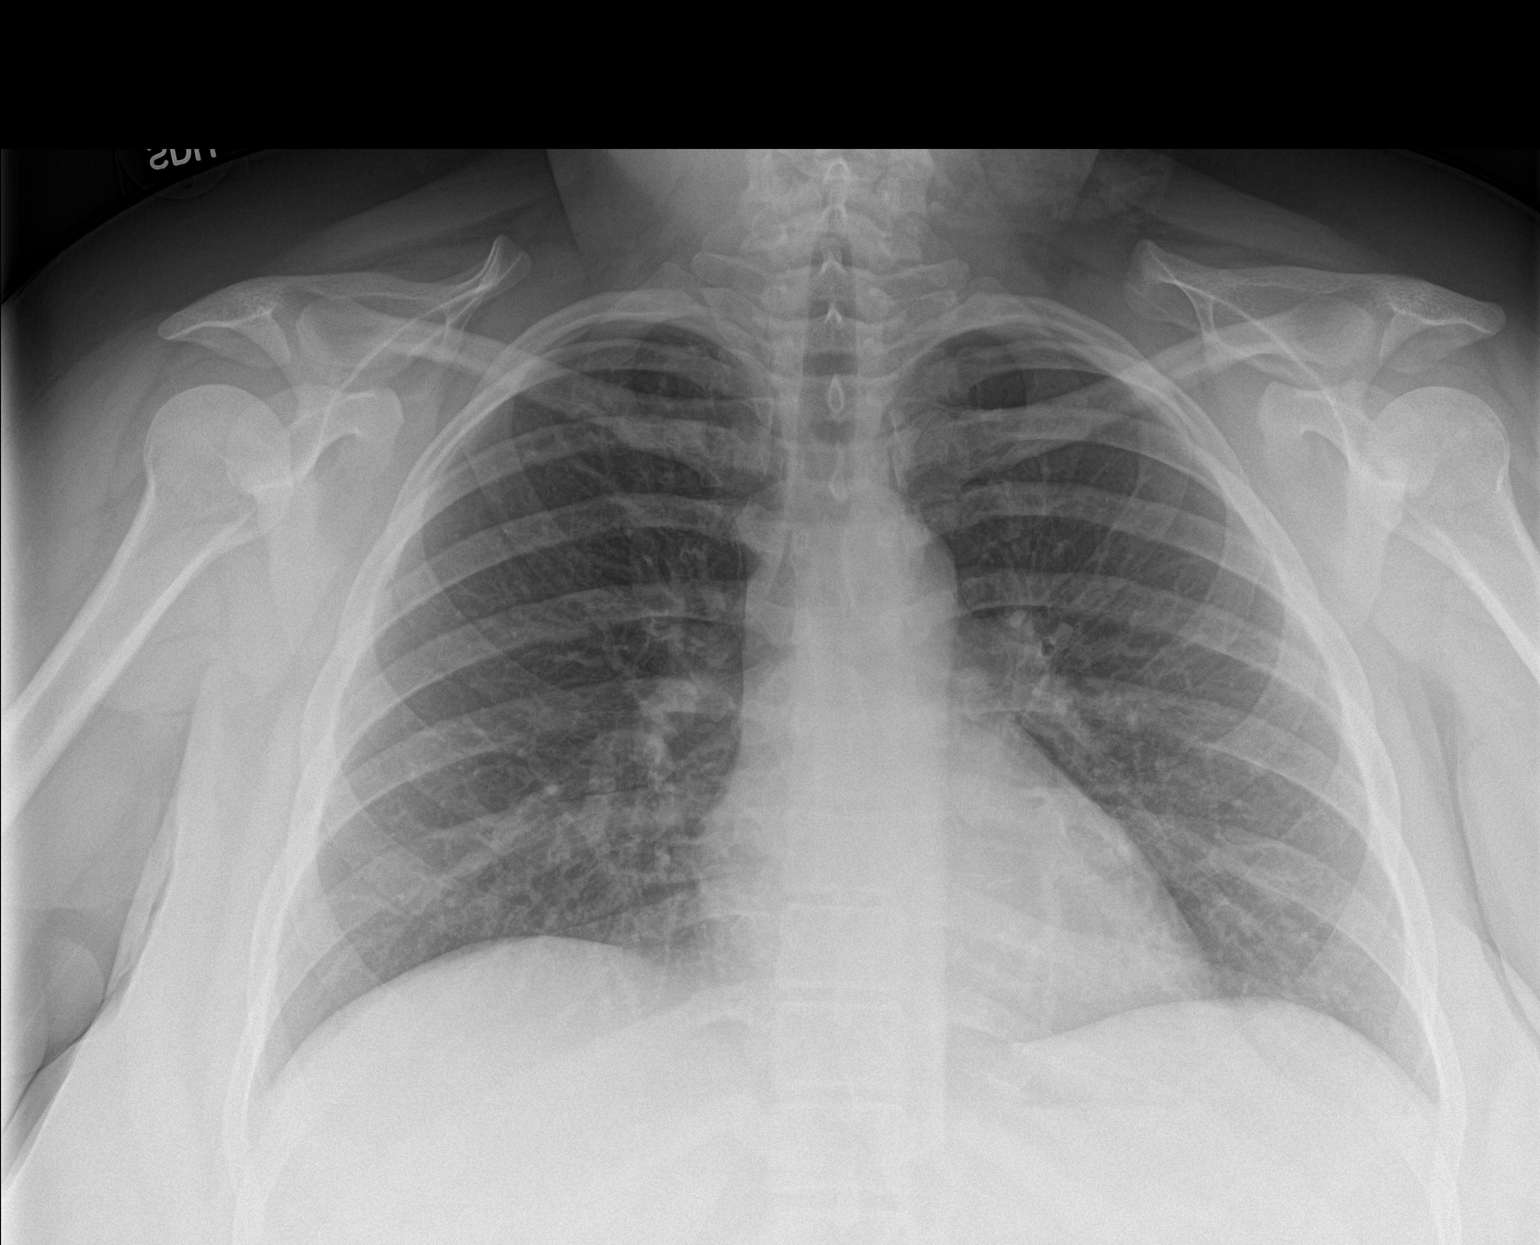
[im 2/2]
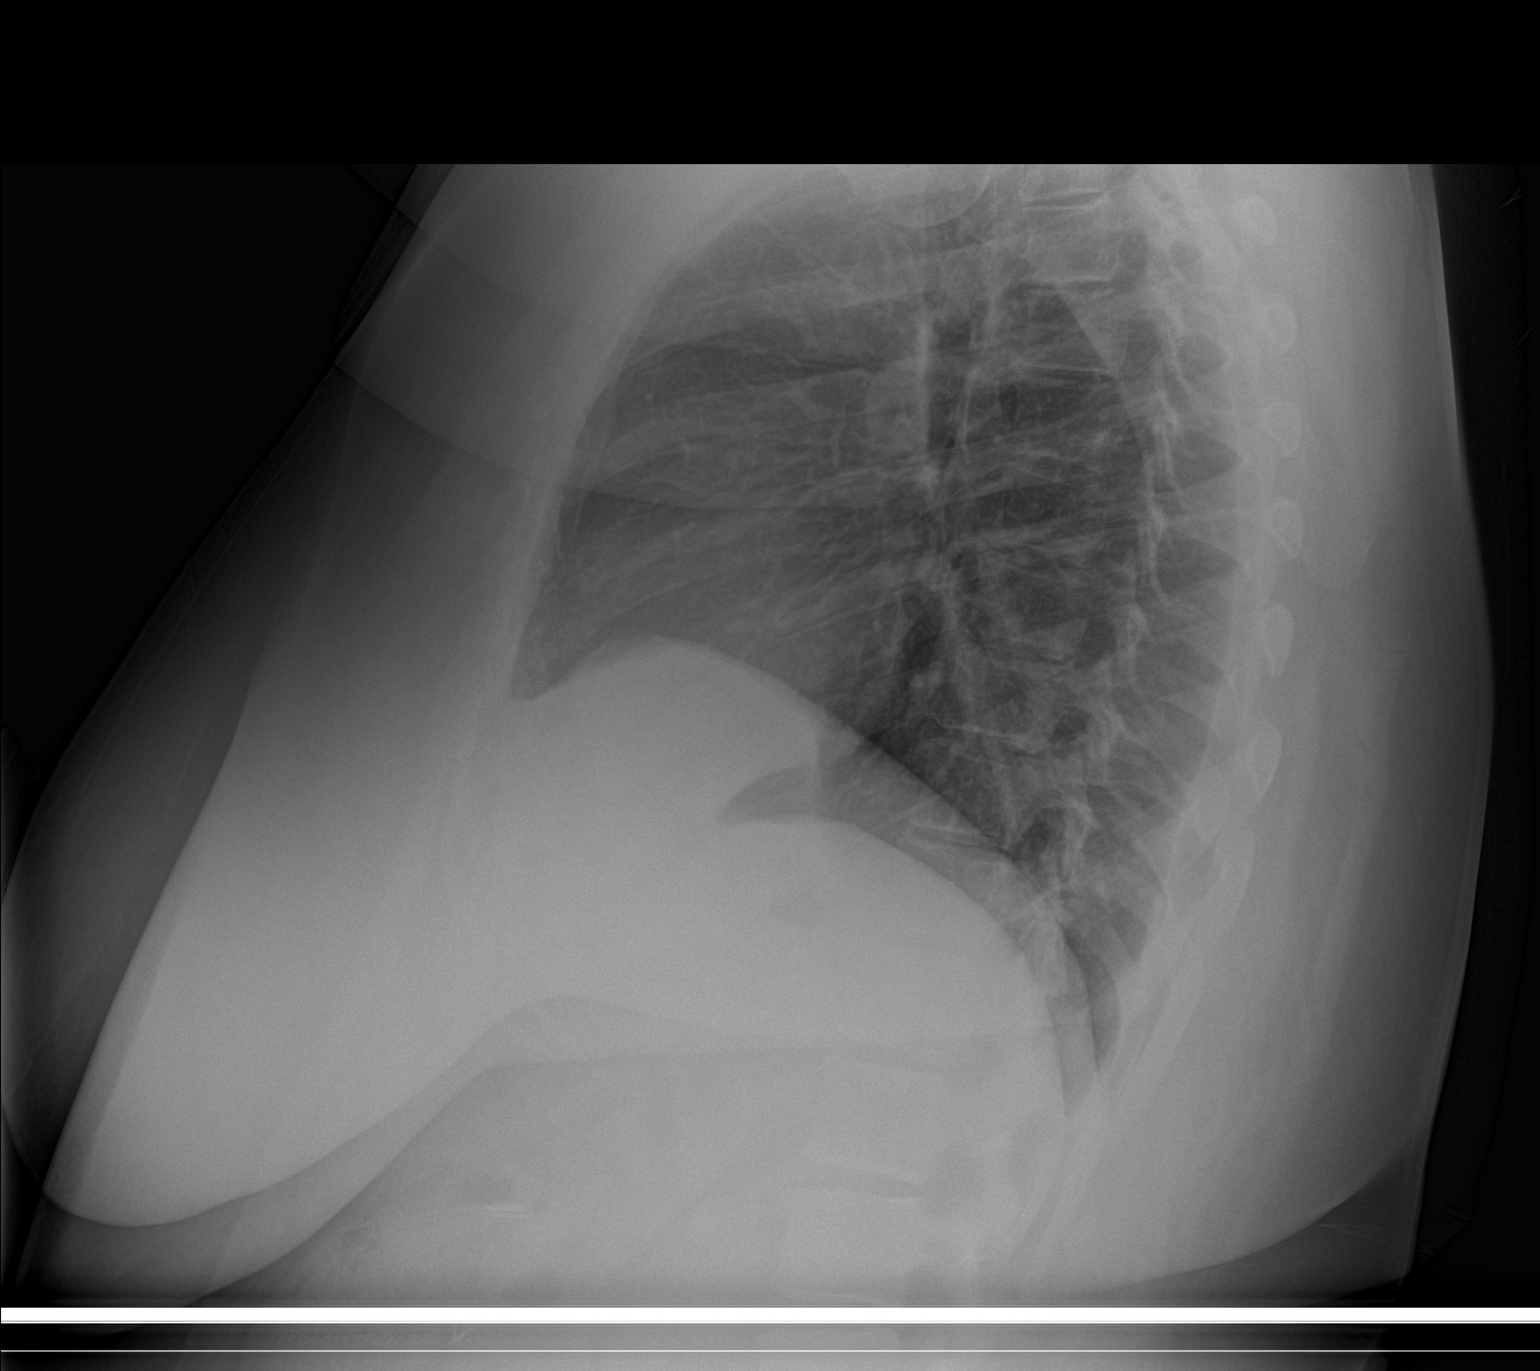

[2 of 2 positions shown; findings below may reference images not displayed]

FINDINGS: The cardiomediastinal silhouette is within normal limits. The lungs
are well inflated and clear. There is no evidence of pleural
effusion or pneumothorax. No acute osseous abnormality is
identified.
IMPRESSION: No active cardiopulmonary disease.

## 2018-03-13 ENCOUNTER — Emergency Department (HOSPITAL_COMMUNITY)
Admission: EM | Admit: 2018-03-13 | Discharge: 2018-03-13 | Disposition: A | Discharge status: Home / Self Care | Payer: Medicaid Other | Attending: Emergency Medicine | Admitting: Emergency Medicine

## 2018-03-13 ENCOUNTER — Encounter (HOSPITAL_COMMUNITY): Payer: Self-pay | Admitting: Emergency Medicine

## 2018-03-13 ENCOUNTER — Emergency Department (HOSPITAL_COMMUNITY): Payer: Medicaid Other

## 2018-03-13 DIAGNOSIS — Z79899 Other long term (current) drug therapy: Secondary | ICD-10-CM | POA: Insufficient documentation

## 2018-03-13 DIAGNOSIS — J45909 Unspecified asthma, uncomplicated: Secondary | ICD-10-CM | POA: Insufficient documentation

## 2018-03-13 DIAGNOSIS — R0789 Other chest pain: Secondary | ICD-10-CM | POA: Diagnosis not present

## 2018-03-13 DIAGNOSIS — E119 Type 2 diabetes mellitus without complications: Secondary | ICD-10-CM | POA: Diagnosis not present

## 2018-03-13 DIAGNOSIS — Z7984 Long term (current) use of oral hypoglycemic drugs: Secondary | ICD-10-CM | POA: Diagnosis not present

## 2018-03-13 DIAGNOSIS — F17210 Nicotine dependence, cigarettes, uncomplicated: Secondary | ICD-10-CM | POA: Diagnosis not present

## 2018-03-13 DIAGNOSIS — K219 Gastro-esophageal reflux disease without esophagitis: Secondary | ICD-10-CM | POA: Insufficient documentation

## 2018-03-13 DIAGNOSIS — R079 Chest pain, unspecified: Secondary | ICD-10-CM | POA: Diagnosis present

## 2018-03-13 LAB — I-STAT BETA HCG BLOOD, ED (MC, WL, AP ONLY): I-stat hCG, quantitative: <5 m[IU]/mL (ref <5)

## 2018-03-13 LAB — CBC
HCT: 42.5 % (ref 36.0–46.0)
HEMOGLOBIN: 12.9 g/dL (ref 12.0–15.0)
MCH: 22.6 pg — AB (ref 26.0–34.0)
MCHC: 30.4 g/dL (ref 30.0–36.0)
MCV: 74.3 fL — AB (ref 78.0–100.0)
Platelets: 385 10*3/uL (ref 150–400)
RBC: 5.72 MIL/uL — ABNORMAL HIGH (ref 3.87–5.11)
RDW: 18.4 % — ABNORMAL HIGH (ref 11.5–15.5)
WBC: 12.5 10*3/uL — ABNORMAL HIGH (ref 4.0–10.5)

## 2018-03-13 LAB — HEPATIC FUNCTION PANEL
ALT: 18 U/L (ref 14–54)
AST: 14 U/L — ABNORMAL LOW (ref 15–41)
Albumin: 3.7 g/dL (ref 3.5–5.0)
Alkaline Phosphatase: 73 U/L (ref 38–126)
Bilirubin, Direct: <0.1 mg/dL — ABNORMAL LOW (ref 0.1–0.5)
Total Bilirubin: 0.4 mg/dL (ref 0.3–1.2)
Total Protein: 7.7 g/dL (ref 6.5–8.1)

## 2018-03-13 LAB — BASIC METABOLIC PANEL
ANION GAP: 10 (ref 5–15)
BUN: 8 mg/dL (ref 6–20)
CO2: 21 mmol/L — AB (ref 22–32)
CREATININE: 0.69 mg/dL (ref 0.44–1.00)
Calcium: 8.8 mg/dL — ABNORMAL LOW (ref 8.9–10.3)
Chloride: 104 mmol/L (ref 101–111)
GFR calc Af Amer: >60 mL/min (ref >60)
GFR calc non Af Amer: >60 mL/min (ref >60)
GLUCOSE: 91 mg/dL (ref 65–99)
Potassium: 4 mmol/L (ref 3.5–5.1)
Sodium: 135 mmol/L (ref 135–145)

## 2018-03-13 LAB — I-STAT TROPONIN, ED: Troponin i, poc: 0 ng/mL (ref 0.00–0.08)

## 2018-03-13 LAB — LIPASE, BLOOD: Lipase: 29 U/L (ref 11–51)

## 2018-03-13 MED ORDER — FAMOTIDINE 20 MG PO TABS
20.0000 mg | ORAL_TABLET | Freq: Once | ORAL | Status: AC
  Administered 2018-03-13: 20 mg via ORAL
  Filled 2018-03-13: qty 1

## 2018-03-13 MED ORDER — GABAPENTIN 400 MG PO CAPS: 400.0000 mg | ORAL_CAPSULE | Freq: Three times a day (TID) | ORAL | 0 refills | Status: DC

## 2018-03-13 MED ORDER — FAMOTIDINE IN NACL 20-0.9 MG/50ML-% IV SOLN: 20.0000 mg | Freq: Once | INTRAVENOUS | Status: DC

## 2018-03-13 MED ORDER — ONDANSETRON HCL 4 MG PO TABS: 4.0000 mg | ORAL_TABLET | Freq: Four times a day (QID) | ORAL | 0 refills | Status: DC

## 2018-03-13 MED ORDER — FAMOTIDINE 20 MG PO TABS: 20.0000 mg | ORAL_TABLET | Freq: Two times a day (BID) | ORAL | 0 refills | Status: DC

## 2018-03-13 MED ORDER — ONDANSETRON HCL 4 MG/2ML IJ SOLN: 4.0000 mg | Freq: Once | INTRAMUSCULAR | Status: DC

## 2018-03-13 NOTE — ED Triage Notes (Signed)
Pt states new onset centralized chest pain starting several hours ago with pain radiating into left breast. Pt also has nausea, dizziness and emesis.

## 2018-03-13 NOTE — Discharge Instructions (Signed)
Begin taking Pepcid instead of ranitidine daily. Resume your gabapentin as prescribed.  You can use ice and heat alternating to your chest. Alternate 20 minutes on, 20 minutes off.  You can take Aleve and Tylenol as prescribed over-the-counter, as needed for your pain.  Please follow-up with your doctor this week for recheck and further evaluation and treatment of your symptoms.  Please return to the emergency department if you develop any new or worsening symptoms.

## 2018-03-13 NOTE — ED Notes (Signed)
Pt discharged from ED; instructions provided and scripts given; Pt encouraged to return to ED if symptoms worsen and to f/u with PCP; Pt verbalized understanding of all instructions 

## 2018-03-13 NOTE — ED Provider Notes (Signed)
[facility] EMERGENCY DEPARTMENT Provider Note   CSN: [id number] Arrival date & time: 03/13/18  1749     History   Chief Complaint Chief Complaint  Patient presents with  . Chest Pain    HPI Faith [name] is a 27 y.o. female with history of bipolar 1 disorder, diabetes, seizures who presents with a 2-year history of intermittent left-sided chest pain that wraps around to her back.  Patient reports she has had pain between her left shoulder blade that she has been taking Robaxin for.  She is seen her PCP and a neurosurgeon that diagnosed her with muscular pain.  She reports today with ongoing pain as well as left upper quadrant pain, nausea, and vomiting.  She reports taking ibuprofen which made her vomit.  She describes her pain in her left breast as burning and radiating around to her back.  She reports is worse with movement, but better with lying down.  It is not pleuritic.  She denies any new leg pain or swelling, recent long trips, surgeries, history of blood clots.  Patient is on Depo-Provera.  HPI  Past Medical History:  Diagnosis Date  . ADHD   . Anemia    BLOOD TRANSFUSION AFTER C-SECTION ON 06-2016-2 UNITS  . Anxiety   . Asthma    WELL CONTROLLED  . Bipolar 1 disorder, depressed ([place])   . Depression   . Diabetes mellitus without complication ([place])   . Gallstones   . GERD (gastroesophageal reflux disease)   . History of blood transfusion 06/2016   RECEIVED 2 UNITS OF BLOOD AFTER C SECTION  . Migraines   . Ovarian cyst   . Seizures ([place])    LAST SEIZURE 2016  . Tubal pregnancy     Patient Active Problem List   Diagnosis Date Noted  . Encounter for maternal care for low transverse scar from previous cesarean delivery 10/12/2017  . Bipolar disorder ([place]) 10/01/2017  . History of cesarean delivery 07/12/2017  . Family history of Huntington's disease   . Asthma affecting pregnancy, antepartum 05/10/2017  . Smoking (tobacco) complicating  pregnancy, unspecified trimester 05/10/2017  . Seizure disorder during pregnancy ([place]) 05/10/2017  . History of anemia 04/19/2017  . History of maternal blood transfusion, currently pregnant 04/19/2017  . Supervision of high risk pregnancy, antepartum 03/02/2017  . Diabetes mellitus during pregnancy, antepartum 01/16/2016  . ADD (attention deficit disorder) 07/17/2015  . Post traumatic stress disorder (PTSD) 06/16/2013  . Depression 06/15/2013  . Suicidal ideation 06/15/2013  . Status epilepticus ([place]) 06/14/2013  . Diabetes mellitus type 2 in obese ([place]) 06/14/2013    Past Surgical History:  Procedure Laterality Date  . CESAREAN SECTION N/A 06/22/2016   Procedure: CESAREAN SECTION;  Surgeon: Malachy Mood, MD;  Location: [facility] ORS;  Service: Obstetrics;  Laterality: N/A;  . CESAREAN SECTION N/A 10/12/2017   Procedure: CESAREAN SECTION;  Surgeon: Malachy Mood, MD;  Location: [facility] ORS;  Service: Obstetrics;  Laterality: N/A;  . CHOLECYSTECTOMY N/A 08/13/2016   Procedure: LAPAROSCOPIC CHOLECYSTECTOMY;  Surgeon: Jules Husbands, MD;  Location: [facility] ORS;  Service: General;  Laterality: N/A;  . DILATION AND CURETTAGE OF UTERUS    . ovarian cyst removed    . SALPINGECTOMY Right 2013   ectopic pregnancy. PER PATIENT, STILL HAS BOTH TUBES  . TONSILLECTOMY    . TONSILLECTOMY AND ADENOIDECTOMY       OB History    Gravida  5   Para  2   Term  2  Preterm      AB  3   Living  2     SAB  3   TAB      Ectopic      Multiple  0   Live Births  2            Home Medications    Prior to Admission medications   Medication Sig Start Date End Date Taking? Authorizing Provider  ACCU-CHEK FASTCLIX LANCETS MISC 1 Units by Percutaneous route 4 (four) times daily. 05/24/17   Malachy Mood, MD  acetaminophen (TYLENOL) 500 MG tablet Take 500 mg by mouth every 6 (six) hours as needed for moderate pain or headache.    [provider]  albuterol (PROVENTIL HFA;VENTOLIN  HFA) 108 (90 BASE) MCG/ACT inhaler Inhale 2 puffs into the lungs every 6 (six) hours as needed. For shortness of breath    [provider]  amphetamine-dextroamphetamine (ADDERALL XR) 10 MG 24 hr capsule Take 10 mg by mouth daily.     [provider]  blood glucose meter kit and supplies KIT Dispense based on patient and insurance preference. Use up to four times daily as directed. (FOR ICD-9 250.00, 250.01). 06/18/15   Ahmed Prima, MD  Blood Glucose Monitoring Suppl (ACCU-CHEK NANO SMARTVIEW) w/Device KIT 1 kit by Subdermal route as directed. Check blood sugars for fasting, and two hours after breakfast, lunch and dinner (4 checks daily) 05/24/17   Malachy Mood, MD  clonazePAM (KLONOPIN) 1 MG tablet Take 1 mg by mouth 2 (two) times daily.    [provider]  clotrimazole (LOTRIMIN) 1 % cream Apply 1 application topically daily as needed (boils).    [provider]  famotidine (PEPCID) 20 MG tablet Take 1 tablet (20 mg total) by mouth 2 (two) times daily. 03/13/18   , Bea Graff, PA-C  ferrous sulfate 325 (65 FE) MG tablet Take 325 mg by mouth 2 (two) times daily.    [provider]  FLUoxetine (PROZAC) 20 MG tablet Take 20 mg by mouth daily. PATIENT CURRENTLY NOT TAKING DUE TO NAUSEA AND VOMITING WHILE PREGNANT    [provider]  gabapentin (NEURONTIN) 400 MG capsule Take 1 capsule (400 mg total) by mouth 3 (three) times daily. 03/13/18   , Bea Graff, PA-C  glucose blood (ACCU-CHEK SMARTVIEW) test strip Use as instructed to check blood sugars 05/24/17   Malachy Mood, MD  glyBURIDE (DIABETA) 2.5 MG tablet Take 1 tablet (2.5 mg total) by mouth 2 (two) times daily with a meal. Patient taking differently: Take 2.5 mg by mouth daily.  04/09/17   Gae Dry, MD  ibuprofen (ADVIL,MOTRIN) 600 MG tablet Take 1 tablet (600 mg total) by mouth every 6 (six) hours as needed. 10/14/17   Rexene Agent, CNM  medroxyPROGESTERone  (DEPO-PROVERA) 150 MG/ML injection Inject 1 mL (150 mg total) into the muscle every 3 (three) months. 10/14/17   Rexene Agent, CNM  ondansetron (ZOFRAN) 4 MG tablet Take 1 tablet (4 mg total) by mouth every 6 (six) hours. 03/13/18   , Bea Graff, PA-C  oxyCODONE (ROXICODONE) 5 MG immediate release tablet Take 1 tablet (5 mg total) by mouth every 4 (four) hours as needed for severe pain. 10/14/17   Rexene Agent, CNM  ranitidine (ZANTAC) 150 MG tablet Take 1 tablet (150 mg total) by mouth at bedtime. 06/03/17   Will Bonnet, MD  simethicone (MYLICON) 80 MG chewable tablet Chew 1 tablet (80 mg total) by  mouth 3 (three) times daily after meals. 10/14/17   Rexene Agent, CNM    Family History Family History  Problem Relation Age of Onset  . Heart disease Father   . Huntington's disease Mother     Social History Social History   Tobacco Use  . Smoking status: Current Every Day Smoker    Packs/day: 0.50    Years: 3.00    Pack years: 1.50    Types: Cigarettes  . Smokeless tobacco: Never Used  Substance Use Topics  . Alcohol use: No    Alcohol/week: 0.0 oz  . Drug use: No     Allergies   Ivp dye [iodinated diagnostic agents]; Latex; Metrizamide; Morphine; Mushroom extract complex; Sulfasalazine; Tomato; Toradol [ketorolac tromethamine]; Aspirin; Dilaudid [hydromorphone hcl]; Lamictal [lamotrigine]; Sulfa antibiotics; Tramadol; Adhesive [tape]; and Iohexol   Review of Systems Review of Systems  Constitutional: Negative for chills and fever.  HENT: Negative for facial swelling and sore throat.   Respiratory: Negative for shortness of breath.   Cardiovascular: Positive for chest pain.  Gastrointestinal: Positive for abdominal pain, nausea and vomiting.  Genitourinary: Negative for dysuria.  Musculoskeletal: Positive for back pain.  Skin: Negative for rash and wound.  Neurological: Negative for headaches.  Psychiatric/Behavioral: The patient is not nervous/anxious.       Physical Exam Updated Vital Signs BP 106/62   Pulse 76   Temp 98.6 F (37 C) (Oral)   Resp (!) 23   Ht 5' (1.524 m)   Wt 96.2 kg (212 lb)   LMP 03/13/2018   SpO2 97%   BMI 41.40 kg/m   Physical Exam  Constitutional: She appears well-developed and well-nourished. No distress.  HENT:  Head: Normocephalic and atraumatic.  Mouth/Throat: Oropharynx is clear and moist. No oropharyngeal exudate.  Eyes: Pupils are equal, round, and reactive to light. Conjunctivae are normal. Right eye exhibits no discharge. Left eye exhibits no discharge. No scleral icterus.  Neck: Normal range of motion. Neck supple. No thyromegaly present.  Cardiovascular: Normal rate, regular rhythm, normal heart sounds and intact distal pulses. Exam reveals no gallop and no friction rub.  No murmur heard. Pulmonary/Chest: Effort normal and breath sounds normal. No stridor. No respiratory distress. She has no wheezes. She has no rales.        Abdominal: Soft. Bowel sounds are normal. She exhibits no distension. There is tenderness in the left upper quadrant. There is no rebound and no guarding.  Musculoskeletal: She exhibits no edema.       Right lower leg: She exhibits no tenderness and no edema.       Left lower leg: She exhibits no tenderness and no edema.  Lymphadenopathy:    She has no cervical adenopathy.  Neurological: She is alert. Coordination normal.  Skin: Skin is warm and dry. No rash noted. She is not diaphoretic. No pallor.  Psychiatric: She has a normal mood and affect.  Nursing note and vitals reviewed.    ED Treatments / Results  Labs (all labs ordered are listed, but only abnormal results are displayed) Labs Reviewed  BASIC METABOLIC PANEL - Abnormal; Notable for the following components:      Result Value   CO2 21 (*)    Calcium 8.8 (*)    All other components within normal limits  CBC - Abnormal; Notable for the following components:   WBC 12.5 (*)    RBC 5.72 (*)    MCV  74.3 (*)    MCH 22.6 (*)  RDW 18.4 (*)    All other components within normal limits  HEPATIC FUNCTION PANEL - Abnormal; Notable for the following components:   AST 14 (*)    Bilirubin, Direct <0.1 (*)    All other components within normal limits  LIPASE, BLOOD  I-STAT TROPONIN, ED  I-STAT BETA HCG BLOOD, ED ([facility], [facility], [facility] ONLY)    EKG EKG Interpretation  Date/Time:  _0 /09/19 2023       Frederica Kuster, PA-C 03/14/18 0127    Pattricia Boss, MD 03/14/18 1356

## 2018-05-12 ENCOUNTER — Other Ambulatory Visit: Payer: Self-pay

## 2018-05-12 MED ORDER — MEDROXYPROGESTERONE ACETATE 150 MG/ML IM SUSP: 150.0000 mg | INTRAMUSCULAR | 2 refills | Status: DC

## 2018-05-24 ENCOUNTER — Encounter: Payer: Self-pay | Admitting: Obstetrics and Gynecology

## 2018-05-24 ENCOUNTER — Other Ambulatory Visit (HOSPITAL_COMMUNITY)
Admission: RE | Admit: 2018-05-24 | Discharge: 2018-05-24 | Disposition: A | Discharge status: Home / Self Care | Payer: Medicaid Other | Source: Ambulatory Visit | Attending: Obstetrics and Gynecology | Admitting: Obstetrics and Gynecology

## 2018-05-24 ENCOUNTER — Ambulatory Visit (INDEPENDENT_AMBULATORY_CARE_PROVIDER_SITE_OTHER): Payer: Medicaid Other | Admitting: Obstetrics and Gynecology

## 2018-05-24 VITALS — BP 104/80 | HR 90 | Ht 60.0 in | Wt 210.0 lb

## 2018-05-24 DIAGNOSIS — L732 Hidradenitis suppurativa: Secondary | ICD-10-CM

## 2018-05-24 DIAGNOSIS — R1032 Left lower quadrant pain: Secondary | ICD-10-CM | POA: Insufficient documentation

## 2018-05-24 DIAGNOSIS — K5900 Constipation, unspecified: Secondary | ICD-10-CM

## 2018-05-24 DIAGNOSIS — Z113 Encounter for screening for infections with a predominantly sexual mode of transmission: Secondary | ICD-10-CM | POA: Insufficient documentation

## 2018-05-24 DIAGNOSIS — N921 Excessive and frequent menstruation with irregular cycle: Secondary | ICD-10-CM

## 2018-05-24 DIAGNOSIS — Z3009 Encounter for other general counseling and advice on contraception: Secondary | ICD-10-CM

## 2018-05-24 MED ORDER — CEPHALEXIN 500 MG PO CAPS: 500.0000 mg | ORAL_CAPSULE | Freq: Two times a day (BID) | ORAL | 0 refills | Status: AC

## 2018-05-24 NOTE — Patient Instructions (Signed)
I value your feedback and entrusting us with your care. If you get a [facility] patient survey, I would appreciate you taking the time to let us know about your experience today. Thank you! 

## 2018-05-24 NOTE — Progress Notes (Addendum)
[name], MD   Chief Complaint  Patient presents with  . Vaginal Discharge    brown discharge when she wipes, no odor or itchiness, sharp pain left pelvic area, pain in lower back, boils in between legs  . Contraception    would like to change from depo to pill    HPI:      [name] is a 27 y.o. [id number] who LMP was Patient's last menstrual period was 05/07/2018 (approximate)., presents today for brown d/c since this morning. No vag itch/odor. Pt on depo, [facility] 1/19 at [facility]. Got 3rd shot 05/14/18. Has occas BTB/cramping. Has had LLQ pain since delivery which is worsening. Now has burning sensation and low back pain. She has a hx of constipation with rectal bleeding during pregnancy. Still having constipation and having BM can improve LLQ sx. Pt then will have episodes of loose stools/diarrhea for several days. This triggers burning sensation LLQ.  She is not sex active due to pain at C/S incision site. No new partners.  No urin sx, no fevers.   Pt would like to change from depo to OCPs. Never been on them before. Hx of seizures and used to be on meds, but stopped during pregnancy. Never restarted but no seizures. No hx of HTN, DVTs.   Pt also with long hx of boils buttocks/inner thigh area. Put on doxy by PCP last month but "made them worse", so pt stopped it. Pt is adopted so doesn't know FH. Has a very painful boil RT inner thigh now, pt tried to lance and pop yesterday.    Past Medical History:  Diagnosis Date  . ADHD   . Anemia    BLOOD TRANSFUSION AFTER C-SECTION ON 06-2016-2 UNITS  . Anxiety   . Asthma    WELL CONTROLLED  . Bipolar 1 disorder, depressed ([place])   . Depression   . Diabetes mellitus without complication ([place])   . Gallstones   . GERD (gastroesophageal reflux disease)   . History of blood transfusion 06/2016   RECEIVED 2 UNITS OF BLOOD AFTER C SECTION  . Migraines   . Ovarian cyst   . Seizures ([place])    LAST SEIZURE 2016  . Tubal  pregnancy     Past Surgical History:  Procedure Laterality Date  . CESAREAN SECTION N/A 06/22/2016   Procedure: CESAREAN SECTION;  Surgeon: Malachy Mood, MD;  Location: [facility] ORS;  Service: Obstetrics;  Laterality: N/A;  . CESAREAN SECTION N/A 10/12/2017   Procedure: CESAREAN SECTION;  Surgeon: Malachy Mood, MD;  Location: [facility] ORS;  Service: Obstetrics;  Laterality: N/A;  . CHOLECYSTECTOMY N/A 08/13/2016   Procedure: LAPAROSCOPIC CHOLECYSTECTOMY;  Surgeon: Jules Husbands, MD;  Location: [facility] ORS;  Service: General;  Laterality: N/A;  . DILATION AND CURETTAGE OF UTERUS    . ovarian cyst removed    . SALPINGECTOMY Right 2013   ectopic pregnancy. PER PATIENT, STILL HAS BOTH TUBES  . TONSILLECTOMY    . TONSILLECTOMY AND ADENOIDECTOMY      Family History  Problem Relation Age of Onset  . Heart disease Father   . Huntington's disease Mother     Social History   Socioeconomic History  . Marital status: Single    Spouse name: Not on file  . Number of children: Not on file  . Years of education: Not on file  . Highest education level: Not on file  Occupational History  . Not on file  Social Needs  .  Financial resource strain: Not on file  . Food insecurity:    Worry: Not on file    Inability: Not on file  . Transportation needs:    Medical: Not on file    Non-medical: Not on file  Tobacco Use  . Smoking status: Current Every Day Smoker    Packs/day: 0.50    Years: 3.00    Pack years: 1.50    Types: Cigarettes  . Smokeless tobacco: Never Used  Substance and Sexual Activity  . Alcohol use: No    Alcohol/week: 0.0 standard drinks  . Drug use: No  . Sexual activity: Yes    Birth control/protection: Injection  Lifestyle  . Physical activity:    Days per week: Not on file    Minutes per session: Not on file  . Stress: Not on file  Relationships  . Social connections:    Talks on phone: Not on file    Gets together: Not on file    Attends religious service: Not on  file    Active member of club or organization: Not on file    Attends meetings of clubs or organizations: Not on file    Relationship status: Not on file  . Intimate partner violence:    Fear of current or ex partner: Not on file    Emotionally abused: Not on file    Physically abused: Not on file    Forced sexual activity: Not on file  Other Topics Concern  . Not on file  Social History Narrative  . Not on file    Outpatient Medications Prior to Visit  Medication Sig Dispense Refill  . ACCU-CHEK FASTCLIX LANCETS MISC 1 Units by Percutaneous route 4 (four) times daily. 100 each 12  . albuterol (PROVENTIL HFA;VENTOLIN HFA) 108 (90 BASE) MCG/ACT inhaler Inhale 2 puffs into the lungs every 6 (six) hours as needed. For shortness of breath    . ALPRAZolam (XANAX) 0.5 MG tablet alprazolam 0.5 mg tablet    . amitriptyline (ELAVIL) 25 MG tablet amitriptyline 25 mg tablet  take 1 tablet by mouth at bedtime for NERVE pain    . baclofen (LIORESAL) 10 MG tablet Take 10 mg by mouth 3 (three) times daily.  0  . blood glucose meter kit and supplies KIT Dispense based on patient and insurance preference. Use up to four times daily as directed. (FOR ICD-9 250.00, 250.01). 1 each 0  . Blood Glucose Monitoring Suppl (ACCU-CHEK NANO SMARTVIEW) w/Device KIT 1 kit by Subdermal route as directed. Check blood sugars for fasting, and two hours after breakfast, lunch and dinner (4 checks daily) 1 kit 0  . cetirizine (ZYRTEC) 10 MG tablet Take 10 mg by mouth daily.  0  . clonazePAM (KLONOPIN) 1 MG tablet Take 1 mg by mouth 2 (two) times daily.    . famotidine (PEPCID) 20 MG tablet Take 1 tablet (20 mg total) by mouth 2 (two) times daily. 30 tablet 0  . ferrous sulfate 325 (65 FE) MG tablet Take 325 mg by mouth 2 (two) times daily.    . fluticasone (FLONASE) 50 MCG/ACT nasal spray instill 2 sprays into each nostril once daily if needed  0  . gabapentin (NEURONTIN) 400 MG capsule Take 1 capsule (400 mg total) by  mouth 3 (three) times daily. 30 capsule 0  . glucose blood (ACCU-CHEK SMARTVIEW) test strip Use as instructed to check blood sugars 100 each 12  . ibuprofen (ADVIL,MOTRIN) 600 MG tablet Take 1 tablet (600 mg total)  by mouth every 6 (six) hours as needed. 60 tablet 3  . medroxyPROGESTERone (DEPO-PROVERA) 150 MG/ML injection Inject 1 mL (150 mg total) into the muscle every 3 (three) months. 1 mL 2  . metFORMIN (GLUCOPHAGE) 500 MG tablet Take 500 mg by mouth daily.  0  . methocarbamol (ROBAXIN) 500 MG tablet methocarbamol 500 mg tablet  take 1 to 2 tablets by mouth every 6 hours if needed for muscle spasm    . ondansetron (ZOFRAN) 4 MG tablet Take 1 tablet (4 mg total) by mouth every 6 (six) hours. 12 tablet 0  . ranitidine (ZANTAC) 150 MG tablet Take 1 tablet (150 mg total) by mouth at bedtime. 30 tablet 10  . acetaminophen (TYLENOL) 500 MG tablet Take 500 mg by mouth every 6 (six) hours as needed for moderate pain or headache.    . amphetamine-dextroamphetamine (ADDERALL XR) 10 MG 24 hr capsule Take 10 mg by mouth daily.     . clotrimazole (LOTRIMIN) 1 % cream Apply 1 application topically daily as needed (boils).    Marland Kitchen FLUoxetine (PROZAC) 20 MG tablet Take 20 mg by mouth daily. PATIENT CURRENTLY NOT TAKING DUE TO NAUSEA AND VOMITING WHILE PREGNANT    . glyBURIDE (DIABETA) 2.5 MG tablet Take 1 tablet (2.5 mg total) by mouth 2 (two) times daily with a meal. (Patient taking differently: Take 2.5 mg by mouth daily. ) 60 tablet 6  . oxyCODONE (ROXICODONE) 5 MG immediate release tablet Take 1 tablet (5 mg total) by mouth every 4 (four) hours as needed for severe pain. 30 tablet 0  . simethicone (MYLICON) 80 MG chewable tablet Chew 1 tablet (80 mg total) by mouth 3 (three) times daily after meals. 30 tablet 0   No facility-administered medications prior to visit.       ROS:  Review of Systems  Constitutional: Negative for fatigue, fever and unexpected weight change.  Respiratory: Negative for  cough, shortness of breath and wheezing.   Cardiovascular: Negative for chest pain, palpitations and leg swelling.  Gastrointestinal: Positive for constipation and diarrhea. Negative for blood in stool, nausea and vomiting.  Endocrine: Negative for cold intolerance, heat intolerance and polyuria.  Genitourinary: Positive for pelvic pain and vaginal bleeding. Negative for dyspareunia, dysuria, flank pain, frequency, genital sores, hematuria, menstrual problem, urgency, vaginal discharge and vaginal pain.  Musculoskeletal: Positive for back pain. Negative for joint swelling and myalgias.  Skin: Positive for wound. Negative for rash.  Neurological: Negative for dizziness, syncope, light-headedness, numbness and headaches.  Hematological: Negative for adenopathy.  Psychiatric/Behavioral: Positive for agitation and dysphoric mood. Negative for confusion, sleep disturbance and suicidal ideas. The patient is not nervous/anxious.     OBJECTIVE:   Vitals:  BP 104/80   Pulse 90   Ht 5' (1.524 m)   Wt 210 lb (95.3 kg)   LMP 05/07/2018 (Approximate)   Breastfeeding? No   BMI 41.01 kg/m   Physical Exam  Constitutional: She is oriented to person, place, and time. Vital signs are normal. She appears well-developed.  Pulmonary/Chest: Effort normal.  Abdominal: Soft. Normal appearance. There is tenderness in the left lower quadrant. There is no rigidity and no guarding.  Genitourinary:    There is no rash, tenderness or lesion on the right labia. There is no rash, tenderness or lesion on the left labia. Uterus is tender. Uterus is not enlarged. Cervix exhibits no motion tenderness. Right adnexum displays no mass and no tenderness. Left adnexum displays tenderness. Left adnexum displays no mass. There is  bleeding in the vagina. No erythema or tenderness in the vagina. No vaginal discharge found.  Genitourinary Comments: MULT CYSTIC LESIONS BILAT BUTTOCKS/INNER THIGH WITH SCARRING, C/W HIDRADENITIS  SUPPURATIVA; 1 LARGER CYSTIC OPEN ABSCESS RT INNER THIGH WITH ERYTHEMA/HEAT AROUND IT  Musculoskeletal: Normal range of motion.  Neurological: She is alert and oriented to person, place, and time.  Psychiatric: She has a normal mood and affect. Her behavior is normal. Thought content normal.  Vitals reviewed.   Assessment/Plan: LLQ pain - Tender on exam. Check GYN u/s. If neg, most likely due to constipation. Will call with results and dispo.  Constipation, unspecified constipation type - Most likely due to depo. Wants to change from depo when next shot due. Increase fiber, lots of water, add colace.   Breakthrough bleeding on depo provera - Reassurance.   Encounter for other general counseling or advice on contraception - Pt wants to change to OCPs. Never been on them before. Hx of seizures. Last depo 05/14/18. Will eval LLQ pain first and then proceed with [facility] options.  Hidradenitis suppurativa - Buttocks/breast. Rx keflex. Dial soap/sitz baths. REfer to derm for mgmt. - Plan: Ambulatory referral to Dermatology, cephALEXin (KEFLEX) 500 MG capsule  Screening for STD (sexually transmitted disease) - Plan: Cervicovaginal ancillary only    Meds ordered this encounter  Medications  . cephALEXin (KEFLEX) 500 MG capsule    Sig: Take 1 capsule (500 mg total) by mouth 2 (two) times daily for 14 days.    Dispense:  28 capsule    Refill:  0    Order Specific Question:   Supervising Provider    Answer:   Gae Dry [[id number]]      Return in about 1 day (around 05/25/2018) for GYN u/s for LLQ pain--[facility] to call pt.  Brooklen Runquist B. Averleigh Savary, PA-C 05/25/2018 8:38 AM

## 2018-05-25 ENCOUNTER — Ambulatory Visit (INDEPENDENT_AMBULATORY_CARE_PROVIDER_SITE_OTHER): Payer: Medicaid Other

## 2018-05-25 DIAGNOSIS — R1032 Left lower quadrant pain: Secondary | ICD-10-CM

## 2018-05-25 LAB — CERVICOVAGINAL ANCILLARY ONLY
Chlamydia: NEGATIVE
Neisseria Gonorrhea: NEGATIVE
Trichomonas: NEGATIVE

## 2018-05-25 NOTE — Addendum Note (Signed)
Addended by: Althea GrimmerOPLAND, ALICIA B on: 05/25/2018 08:38 AM   Modules accepted: Orders

## 2018-05-26 ENCOUNTER — Telehealth: Payer: Self-pay | Admitting: Obstetrics and Gynecology

## 2018-05-26 MED ORDER — NORETHIN ACE-ETH ESTRAD-FE 1-20 MG-MCG(24) PO TABS: 1.0000 | ORAL_TABLET | Freq: Every day | ORAL | 0 refills | Status: DC

## 2018-05-26 NOTE — Telephone Encounter (Signed)
Pt aware of neg GYN u/s. LLQ pain/pelvic pain most likely due to GI--constipation with subsequent diarrhea. Add fiber/colace/lots of water. F/u with PCP.  Pt on depo which could be contributing to GI sx. Wants to change to OCPs. Never done them before. Hx of seizures but no recent sx/no meds taken. OCP start 07/31/18 since last depo 05/14/18. Rx lomedia. Condoms. F/u prn.  Annual due with PCP.    ULTRASOUND REPORT  Patient Name: Faith [name] M Herskowitz DOB: Mar 03, 1991 MRN: [id number]  Location: [facility]  Date of Service: 05/25/2018    Indications:Pelvic Pain Findings:  The uterus is anteverted and measures 8.39 x 5.14 x 3.65. Echo texture is homogenous without evidence of focal masses.  The Endometrium measures 3.16 mm.  Right Ovary measures 3.52 x 2.32 x 2.21 cm. It is normal in appearance. Left Ovary measures 3.03 x 2.04 x 2.52 cm. It is normal in appearance. Survey of the adnexa demonstrates no adnexal masses. There is no free fluid in the cul de sac.  Impression: 1. Negative gyn ultrasound  Recommendations: 1.Clinical correlation with the patient's History and Physical Exam.  Willette AlmaKristen Priestley, RDMS, RVT

## 2018-08-25 ENCOUNTER — Other Ambulatory Visit: Payer: Self-pay | Admitting: Obstetrics and Gynecology

## 2018-09-16 ENCOUNTER — Other Ambulatory Visit: Payer: Self-pay | Admitting: Anesthesiology

## 2018-09-16 DIAGNOSIS — M545 Low back pain, unspecified: Secondary | ICD-10-CM

## 2018-09-16 DIAGNOSIS — M5417 Radiculopathy, lumbosacral region: Secondary | ICD-10-CM

## 2018-10-04 ENCOUNTER — Ambulatory Visit: Payer: Medicaid Other

## 2018-10-17 ENCOUNTER — Ambulatory Visit
Admission: RE | Admit: 2018-10-17 | Discharge: 2018-10-17 | Disposition: A | Discharge status: Home / Self Care | Payer: Medicaid Other | Source: Ambulatory Visit | Attending: Anesthesiology | Admitting: Anesthesiology

## 2018-10-17 DIAGNOSIS — M545 Low back pain, unspecified: Secondary | ICD-10-CM

## 2018-10-17 DIAGNOSIS — M5417 Radiculopathy, lumbosacral region: Secondary | ICD-10-CM | POA: Diagnosis present

## 2019-03-22 ENCOUNTER — Encounter: Payer: Self-pay | Admitting: Emergency Medicine

## 2019-03-22 ENCOUNTER — Other Ambulatory Visit: Payer: Self-pay

## 2019-03-22 ENCOUNTER — Emergency Department
Admission: EM | Admit: 2019-03-22 | Discharge: 2019-03-22 | Disposition: A | Discharge status: Home / Self Care | Payer: Medicaid Other | Attending: Emergency Medicine | Admitting: Emergency Medicine

## 2019-03-22 DIAGNOSIS — F17210 Nicotine dependence, cigarettes, uncomplicated: Secondary | ICD-10-CM | POA: Diagnosis not present

## 2019-03-22 DIAGNOSIS — R112 Nausea with vomiting, unspecified: Secondary | ICD-10-CM | POA: Diagnosis present

## 2019-03-22 DIAGNOSIS — Z79899 Other long term (current) drug therapy: Secondary | ICD-10-CM | POA: Insufficient documentation

## 2019-03-22 DIAGNOSIS — N390 Urinary tract infection, site not specified: Secondary | ICD-10-CM | POA: Diagnosis not present

## 2019-03-22 LAB — COMPREHENSIVE METABOLIC PANEL
ALT: 21 U/L (ref 0–44)
AST: 16 U/L (ref 15–41)
Albumin: 3.8 g/dL (ref 3.5–5.0)
Alkaline Phosphatase: 68 U/L (ref 38–126)
Anion gap: 11 (ref 5–15)
BUN: 7 mg/dL (ref 6–20)
CO2: 21 mmol/L — ABNORMAL LOW (ref 22–32)
Calcium: 8.7 mg/dL — ABNORMAL LOW (ref 8.9–10.3)
Chloride: 103 mmol/L (ref 98–111)
Creatinine, Ser: 0.65 mg/dL (ref 0.44–1.00)
GFR calc Af Amer: >60 mL/min (ref >60)
GFR calc non Af Amer: >60 mL/min (ref >60)
Glucose, Bld: 118 mg/dL — ABNORMAL HIGH (ref 70–99)
Potassium: 3.6 mmol/L (ref 3.5–5.1)
Sodium: 135 mmol/L (ref 135–145)
Total Bilirubin: 0.3 mg/dL (ref 0.3–1.2)
Total Protein: 7.2 g/dL (ref 6.5–8.1)

## 2019-03-22 LAB — CBC
HCT: 45.2 % (ref 36.0–46.0)
Hemoglobin: 14.5 g/dL (ref 12.0–15.0)
MCH: 24.7 pg — ABNORMAL LOW (ref 26.0–34.0)
MCHC: 32.1 g/dL (ref 30.0–36.0)
MCV: 77 fL — ABNORMAL LOW (ref 80.0–100.0)
Platelets: 376 10*3/uL (ref 150–400)
RBC: 5.87 MIL/uL — ABNORMAL HIGH (ref 3.87–5.11)
RDW: 14.9 % (ref 11.5–15.5)
WBC: 11.1 10*3/uL — ABNORMAL HIGH (ref 4.0–10.5)
nRBC: 0 % (ref 0.0–0.2)

## 2019-03-22 LAB — URINALYSIS, COMPLETE (UACMP) WITH MICROSCOPIC
Bilirubin Urine: NEGATIVE
Glucose, UA: NEGATIVE mg/dL
Hgb urine dipstick: NEGATIVE
Ketones, ur: NEGATIVE mg/dL
Nitrite: NEGATIVE
Protein, ur: NEGATIVE mg/dL
Specific Gravity, Urine: 1.009 (ref 1.005–1.030)
pH: 7 (ref 5.0–8.0)

## 2019-03-22 LAB — HCG, QUANTITATIVE, PREGNANCY: hCG, Beta Chain, Quant, S: <1 m[IU]/mL (ref <5)

## 2019-03-22 LAB — LIPASE, BLOOD: Lipase: 28 U/L (ref 11–51)

## 2019-03-22 LAB — POCT PREGNANCY, URINE: Preg Test, Ur: NEGATIVE

## 2019-03-22 MED ORDER — NITROFURANTOIN MONOHYD MACRO 100 MG PO CAPS: 100.0000 mg | ORAL_CAPSULE | Freq: Two times a day (BID) | ORAL | 0 refills | Status: DC

## 2019-03-22 MED ORDER — SODIUM CHLORIDE 0.9% FLUSH: 3.0000 mL | Freq: Once | INTRAVENOUS | Status: DC

## 2019-03-22 MED ORDER — ONDANSETRON HCL 4 MG/2ML IJ SOLN
4.0000 mg | Freq: Once | INTRAMUSCULAR | Status: AC
  Administered 2019-03-22: 4 mg via INTRAVENOUS
  Filled 2019-03-22: qty 2

## 2019-03-22 MED ORDER — SODIUM CHLORIDE 0.9 % IV BOLUS
1000.0000 mL | Freq: Once | INTRAVENOUS | Status: AC
  Administered 2019-03-22: 1000 mL via INTRAVENOUS

## 2019-03-22 NOTE — ED Notes (Signed)
Pt does not want to finish fluids due to her kids waiting on her in the car with family. PA notified and d/c paperwork printed.

## 2019-03-22 NOTE — ED Provider Notes (Addendum)
[facility] Emergency Department Provider Note  ____________________________________________   None    (approximate)  I have reviewed the triage vital signs and the nursing notes.   HISTORY  Chief Complaint Nausea    HPI Faith [name] is a 28 y.o. female presents emergency department complaining of nausea vomiting started yesterday.  She states she ate [employer] yesterday and started having nausea and vomiting afterwards.  She denies any diarrhea.  She does feel like she is dehydrated.  Onset of symptoms was last night.  She states last time she was pregnant he did not show in her urine or her blood but only showed by ultrasound.    Past Medical History:  Diagnosis Date  . ADHD   . Anemia    BLOOD TRANSFUSION AFTER C-SECTION ON 06-2016-2 UNITS  . Anxiety   . Asthma    WELL CONTROLLED  . Bipolar 1 disorder, depressed ([place])   . Depression   . Diabetes mellitus without complication ([place])   . Gallstones   . GERD (gastroesophageal reflux disease)   . History of blood transfusion 06/2016   RECEIVED 2 UNITS OF BLOOD AFTER C SECTION  . Migraines   . Ovarian cyst   . Seizures ([place])    LAST SEIZURE 2016  . Tubal pregnancy     Patient Active Problem List   Diagnosis Date Noted  . Hidradenitis suppurativa 05/24/2018  . LLQ pain 05/24/2018  . Encounter for maternal care for low transverse scar from previous cesarean delivery 10/12/2017  . Bipolar disorder ([place]) 10/01/2017  . History of cesarean delivery 07/12/2017  . Family history of Huntington's disease   . Asthma affecting pregnancy, antepartum 05/10/2017  . Smoking (tobacco) complicating pregnancy, unspecified trimester 05/10/2017  . Seizure disorder during pregnancy ([place]) 05/10/2017  . History of anemia 04/19/2017  . History of maternal blood transfusion, currently pregnant 04/19/2017  . Supervision of high risk pregnancy, antepartum 03/02/2017  . Diabetes mellitus during pregnancy,  antepartum 01/16/2016  . ADD (attention deficit disorder) 07/17/2015  . Post traumatic stress disorder (PTSD) 06/16/2013  . Depression 06/15/2013  . Suicidal ideation 06/15/2013  . Status epilepticus ([place]) 06/14/2013  . Diabetes mellitus type 2 in obese ([place]) 06/14/2013    Past Surgical History:  Procedure Laterality Date  . CESAREAN SECTION N/A 06/22/2016   Procedure: CESAREAN SECTION;  Surgeon: Malachy Mood, MD;  Location: [facility] ORS;  Service: Obstetrics;  Laterality: N/A;  . CESAREAN SECTION N/A 10/12/2017   Procedure: CESAREAN SECTION;  Surgeon: Malachy Mood, MD;  Location: [facility] ORS;  Service: Obstetrics;  Laterality: N/A;  . CHOLECYSTECTOMY N/A 08/13/2016   Procedure: LAPAROSCOPIC CHOLECYSTECTOMY;  Surgeon: Jules Husbands, MD;  Location: [facility] ORS;  Service: General;  Laterality: N/A;  . DILATION AND CURETTAGE OF UTERUS    . ovarian cyst removed    . SALPINGECTOMY Right 2013   ectopic pregnancy. PER PATIENT, STILL HAS BOTH TUBES  . TONSILLECTOMY    . TONSILLECTOMY AND ADENOIDECTOMY      Prior to Admission medications   Medication Sig Start Date End Date Taking? Authorizing Provider  ACCU-CHEK FASTCLIX LANCETS MISC 1 Units by Percutaneous route 4 (four) times daily. 05/24/17   Malachy Mood, MD  ACCU-CHEK SMARTVIEW test strip TEST BLOOD SUGAR FOUR TIMES DAILY AS DIRECTED 08/25/18   Malachy Mood, MD  albuterol (PROVENTIL HFA;VENTOLIN HFA) 108 (90 BASE) MCG/ACT inhaler Inhale 2 puffs into the lungs every 6 (six) hours as needed. For shortness of breath    [provider]  ALPRAZolam Duanne Moron) 0.5 MG tablet alprazolam 0.5 mg tablet 01/27/18   [provider]  amitriptyline (ELAVIL) 25 MG tablet amitriptyline 25 mg tablet  take 1 tablet by mouth at bedtime for NERVE pain    [provider]  baclofen (LIORESAL) 10 MG tablet Take 10 mg by mouth 3 (three) times daily. 03/10/18   [provider]  blood glucose meter kit and supplies KIT Dispense  based on patient and insurance preference. Use up to four times daily as directed. (FOR ICD-9 250.00, 250.01). 06/18/15   Ahmed Prima, MD  Blood Glucose Monitoring Suppl (ACCU-CHEK NANO SMARTVIEW) w/Device KIT 1 kit by Subdermal route as directed. Check blood sugars for fasting, and two hours after breakfast, lunch and dinner (4 checks daily) 05/24/17   Malachy Mood, MD  cetirizine (ZYRTEC) 10 MG tablet Take 10 mg by mouth daily. 04/11/18   [provider]  clonazePAM (KLONOPIN) 1 MG tablet Take 1 mg by mouth 2 (two) times daily.    [provider]  famotidine (PEPCID) 20 MG tablet Take 1 tablet (20 mg total) by mouth 2 (two) times daily. 03/13/18   Law, Bea Graff, PA-C  ferrous sulfate 325 (65 FE) MG tablet Take 325 mg by mouth 2 (two) times daily.    [provider]  fluticasone Asencion Islam) 50 MCG/ACT nasal spray instill 2 sprays into each nostril once daily if needed 04/11/18   [provider]  gabapentin (NEURONTIN) 400 MG capsule Take 1 capsule (400 mg total) by mouth 3 (three) times daily. 03/13/18   Law, Bea Graff, PA-C  ibuprofen (ADVIL,MOTRIN) 600 MG tablet Take 1 tablet (600 mg total) by mouth every 6 (six) hours as needed. 10/14/17   Rexene Agent, CNM  metFORMIN (GLUCOPHAGE) 500 MG tablet Take 500 mg by mouth daily. 04/11/18   [provider]  methocarbamol (ROBAXIN) 500 MG tablet methocarbamol 500 mg tablet  take 1 to 2 tablets by mouth every 6 hours if needed for muscle spasm 01/27/18   [provider]  nitrofurantoin, macrocrystal-monohydrate, (MACROBID) 100 MG capsule Take 1 capsule (100 mg total) by mouth 2 (two) times daily. 03/22/19   , Linden Dolin, PA-C  Norethindrone Acetate-Ethinyl Estrad-FE (MICROGESTIN 24 FE) 1-20 MG-MCG(24) tablet Take 1 tablet by mouth daily. Start pills 07/31/18 11/01/76   Copland, Elmo Putt B, PA-C  ondansetron (ZOFRAN) 4 MG tablet Take 1 tablet (4 mg total) by mouth every 6 (six) hours. 03/13/18   Law,  Bea Graff, PA-C  ranitidine (ZANTAC) 150 MG tablet Take 1 tablet (150 mg total) by mouth at bedtime. 06/03/17   Will Bonnet, MD    Allergies Ivp dye [iodinated diagnostic agents], Latex, Metrizamide, Morphine, Mushroom extract complex, Sulfasalazine, Tomato, Toradol [ketorolac tromethamine], Aspirin, Dilaudid [hydromorphone hcl], Lamictal [lamotrigine], Sulfa antibiotics, Tramadol, Adhesive [tape], and Iohexol  Family History  Problem Relation Age of Onset  . Heart disease Father   . Huntington's disease Mother     Social History Social History   Tobacco Use  . Smoking status: Current Every Day Smoker    Packs/day: 0.50    Years: 3.00    Pack years: 1.50    Types: Cigarettes  . Smokeless tobacco: Never Used  Substance Use Topics  . Alcohol use: No    Alcohol/week: 0.0 standard drinks  . Drug use: No    Review of Systems  Constitutional: No fever/chills Eyes: No visual changes. ENT: No sore throat. Respiratory: Denies cough Gastrointestinal: Positive for nausea and vomiting  Genitourinary: Negative for dysuria. Musculoskeletal: Negative for back pain. Skin: Negative for rash.    ____________________________________________   PHYSICAL EXAM:  VITAL SIGNS: ED Triage Vitals  Enc Vitals Group     BP 03/22/19 1150 124/73     Pulse Rate 03/22/19 1150 (!) 106     Resp 03/22/19 1150 16     Temp 03/22/19 1430 98.4 F (36.9 C)     Temp Source 03/22/19 1150 Oral     SpO2 03/22/19 1150 98 %     Weight 03/22/19 1150 200 lb (90.7 kg)     Height 03/22/19 1150 5' (1.524 m)     Head Circumference --      Peak Flow --      Pain Score 03/22/19 1150 0     Pain Loc --      Pain Edu? --      Excl. in [place]? --     Constitutional: Alert and oriented. Well appearing and in no acute distress. Eyes: Conjunctivae are normal.  Head: Atraumatic. Nose: No congestion/rhinnorhea. Mouth/Throat: Mucous membranes are moist.   Neck:  supple no lymphadenopathy noted  Cardiovascular: Normal rate, regular rhythm. Heart sounds are normal Respiratory: Normal respiratory effort.  No retractions, lungs c t a  Abd: soft nontender bs normal all 4 quad, negative McBurney's point tenderness GU: deferred Musculoskeletal: FROM all extremities, warm and well perfused Neurologic:  Normal speech and language.  Skin:  Skin is warm, dry and intact. No rash noted. Psychiatric: Mood and affect are normal. Speech and behavior are normal.  ____________________________________________   LABS (all labs ordered are listed, but only abnormal results are displayed)  Labs Reviewed  COMPREHENSIVE METABOLIC PANEL - Abnormal; Notable for the following components:      Result Value   CO2 21 (*)    Glucose, Bld 118 (*)    Calcium 8.7 (*)    All other components within normal limits  CBC - Abnormal; Notable for the following components:   WBC 11.1 (*)    RBC 5.87 (*)    MCV 77.0 (*)    MCH 24.7 (*)    All other components within normal limits  URINALYSIS, COMPLETE (UACMP) WITH MICROSCOPIC - Abnormal; Notable for the following components:   Color, Urine YELLOW (*)    APPearance HAZY (*)    Leukocytes,Ua TRACE (*)    Bacteria, UA RARE (*)    All other components within normal limits  LIPASE, BLOOD  HCG, QUANTITATIVE, PREGNANCY  POC URINE PREG, ED  POCT PREGNANCY, URINE   ____________________________________________   ____________________________________________  RADIOLOGY    ____________________________________________   PROCEDURES  Procedure(s) performed: Saline lock, normal saline 1 L IV, Zofran 4 mg IV  Procedures    ____________________________________________   INITIAL IMPRESSION / ASSESSMENT AND PLAN / ED COURSE  Pertinent labs & imaging results that were available during my care of the patient were reviewed by me and considered in my medical decision making (see chart for details).   Patient is 28 year old female presents emergency department  complaining of nausea vomiting started last night.  Physical exam shows patient appears very well.  Vitals are normal.  Abdomen is soft and mildly tender but negative McBurney's point tenderness.  DDX: Viral gastroenteritis, nausea/vomiting associated pregnancy, food poisoning  CBC is elevated WBC 11.1, lipase is normal, urine pregnant is negative, comprehensive metabolic panel is basically normal, beta hCG  is pending   Discussed all the labs with patient.  Due to her not having her  gallbladder at this time I told her do not believe this is acute cholecystitis.  Questionable story about her pregnancy that did not show up with a urine or blood test.  She states that she thinks she is pregnant she can follow-up at her doctor's office for an ultrasound.  I agree this would be the most appropriate treatment at this time.  Therefore she agrees to IV fluids and Zofran.  Patient will most likely be discharged after her fluids.  Patient states she feels better with fluids.  She is discharged stable condition.  As part of my medical decision making, I reviewed the following data within the [place] notes reviewed and incorporated, Labs reviewed see above, Old chart reviewed, Notes from prior ED visits and  Controlled Substance Database  ____________________________________________   FINAL CLINICAL IMPRESSION(S) / ED DIAGNOSES  Final diagnoses:  Nausea and vomiting in adult  Urinary tract infection without hematuria, site unspecified      NEW MEDICATIONS STARTED DURING THIS VISIT:  New Prescriptions   NITROFURANTOIN, MACROCRYSTAL-MONOHYDRATE, (MACROBID) 100 MG CAPSULE    Take 1 capsule (100 mg total) by mouth 2 (two) times daily.     Note:  This document was prepared using Dragon voice recognition software and may include unintentional dictation errors.    Versie Starks, PA-C 03/22/19 1520    Caryn Section Linden Dolin, PA-C 03/22/19 1529    Arta Silence,  MD 03/23/19 1328

## 2019-03-22 NOTE — ED Triage Notes (Signed)
C/O nausea and feeling weak.  Onset of symptoms last night.   States 'I feel like I'm pregnant or something."  AAOx3.  Skin warm and dry. NAD

## 2019-03-22 NOTE — Discharge Instructions (Addendum)
Follow-up with your regular doctor if not better in 3 days.  Return emergency department if worsening.  Take medication as prescribed.

## 2019-03-22 NOTE — ED Notes (Signed)
Pt reports that with one of her children the beta quant and urine showed negative and she was 3 months pregnant. Pt also has not had her period since February.

## 2019-05-17 ENCOUNTER — Ambulatory Visit (INDEPENDENT_AMBULATORY_CARE_PROVIDER_SITE_OTHER): Payer: Medicaid Other | Admitting: Obstetrics and Gynecology

## 2019-05-17 ENCOUNTER — Other Ambulatory Visit: Payer: Self-pay

## 2019-05-17 ENCOUNTER — Encounter: Payer: Self-pay | Admitting: Obstetrics and Gynecology

## 2019-05-17 VITALS — BP 139/81 | Ht 60.0 in | Wt 227.0 lb

## 2019-05-17 DIAGNOSIS — N911 Secondary amenorrhea: Secondary | ICD-10-CM

## 2019-05-17 DIAGNOSIS — E282 Polycystic ovarian syndrome: Secondary | ICD-10-CM

## 2019-05-17 NOTE — Progress Notes (Signed)
Obstetrics & Gynecology Office Visit   Chief Complaint:  Chief Complaint  Patient presents with  . Amenorrhea    hasnt had a period since she stopped depo last year june 10    History of Present Illness: 28 y.o. [id number] presenting for the evaluation of absent menstruation.  The patient report No LMP recorded. (Menstrual status: Irregular Periods)..  Preceding the current episode of amenorrhea, the patient's menstrual cycles had been irregular, previous conceptions on clomid secondary to PCOS  The patient is currently sexually active and is not using anything for contraception.  She does have a history of infertility.  The patient has not started any new medications coinciding with the onset of her symptoms.  The patient is not interested in conceiving in the near future.  She does not exercise excessively.  Last Depo 03/2018 without resumption of menses since then  PCOS/[place] Wt Change: actually weight slightly down from last visit Hirsutism: no Acne: yes Balding: no Lipodystrophy:  yes Acanthosis nigricans:  no Striae:  yes  Hyperprolactinemia Galactorrhea: no Headaches: Yes (migraines) Vision Changes:  no Prior obstetric hemorrhage: no  Thyroid Temperature Intolerance: no Constipation or Diarrhea: no Hair Thinning:  no Palpitations:  {yes/no:63)  Outlet Obstruction: Prior D&C: no Prior myomectomy: no Prior LEEP or CKC: no   Review of Systems: Review of Systems  Constitutional: Negative.   Gastrointestinal: Negative.   Genitourinary: Negative.   Musculoskeletal: Positive for back pain.  Neurological: Positive for headaches.     Past Medical History:  Past Medical History:  Diagnosis Date  . ADHD   . Anemia    BLOOD TRANSFUSION AFTER C-SECTION ON 06-2016-2 UNITS  . Anxiety   . Asthma    WELL CONTROLLED  . Bipolar 1 disorder, depressed ([place])   . Depression   . Diabetes mellitus without complication ([place])   . Gallstones   . GERD (gastroesophageal  reflux disease)   . History of blood transfusion 06/2016   RECEIVED 2 UNITS OF BLOOD AFTER C SECTION  . Migraines   . Ovarian cyst   . Seizures ([place])    LAST SEIZURE 2016  . Tubal pregnancy     Past Surgical History:  Past Surgical History:  Procedure Laterality Date  . CESAREAN SECTION N/A 06/22/2016   Procedure: CESAREAN SECTION;  Surgeon: Malachy Mood, MD;  Location: [facility] ORS;  Service: Obstetrics;  Laterality: N/A;  . CESAREAN SECTION N/A 10/12/2017   Procedure: CESAREAN SECTION;  Surgeon: Malachy Mood, MD;  Location: [facility] ORS;  Service: Obstetrics;  Laterality: N/A;  . CHOLECYSTECTOMY N/A 08/13/2016   Procedure: LAPAROSCOPIC CHOLECYSTECTOMY;  Surgeon: Jules Husbands, MD;  Location: [facility] ORS;  Service: General;  Laterality: N/A;  . DILATION AND CURETTAGE OF UTERUS    . ovarian cyst removed    . SALPINGECTOMY Right 2013   ectopic pregnancy. PER PATIENT, STILL HAS BOTH TUBES  . TONSILLECTOMY    . TONSILLECTOMY AND ADENOIDECTOMY      Gynecologic History: No LMP recorded. (Menstrual status: Irregular Periods).  Obstetric History: [id number]  Family History:  Family History  Problem Relation Age of Onset  . Heart disease Father   . Huntington's disease Mother     Social History:  Social History   Socioeconomic History  . Marital status: Single    Spouse name: Not on file  . Number of children: Not on file  . Years of education: Not on file  . Highest education level: Not on file  Occupational History  .  Not on file  Social Needs  . Financial resource strain: Not on file  . Food insecurity    Worry: Not on file    Inability: Not on file  . Transportation needs    Medical: Not on file    Non-medical: Not on file  Tobacco Use  . Smoking status: Current Every Day Smoker    Packs/day: 0.50    Years: 3.00    Pack years: 1.50    Types: Cigarettes  . Smokeless tobacco: Never Used  Substance and Sexual Activity  . Alcohol use: No    Alcohol/week: 0.0 standard  drinks  . Drug use: No  . Sexual activity: Yes    Birth control/protection: None  Lifestyle  . Physical activity    Days per week: Not on file    Minutes per session: Not on file  . Stress: Not on file  Relationships  . Social Herbalist on phone: Not on file    Gets together: Not on file    Attends religious service: Not on file    Active member of club or organization: Not on file    Attends meetings of clubs or organizations: Not on file    Relationship status: Not on file  . Intimate partner violence    Fear of current or ex partner: Not on file    Emotionally abused: Not on file    Physically abused: Not on file    Forced sexual activity: Not on file  Other Topics Concern  . Not on file  Social History Narrative  . Not on file    Allergies:  Allergies  Allergen Reactions  . Ivp Dye [Iodinated Diagnostic Agents] Anaphylaxis    Per patient, she does not have problems with betadine  . Latex Rash  . Metrizamide Anaphylaxis  . Morphine Other (See Comments)    bradycradia   . Mushroom Extract Complex Anaphylaxis    Hives then swelling of throat and can not breath  . Sulfasalazine Hives  . Tomato Anaphylaxis  . Toradol [Ketorolac Tromethamine] Anaphylaxis, Hives and Swelling  . Aspirin Hives  . Dilaudid [Hydromorphone Hcl] Hives  . Lamictal [Lamotrigine] Hives  . Sulfa Antibiotics Hives  . Tramadol Swelling, Rash and Hives  . Adhesive [Tape] Itching    USE PAPER TAPE  . Iohexol Rash    (contrast dye) causes red bumps    Medications: Prior to Admission medications   Medication Sig Start Date End Date Taking? Authorizing Provider  ACCU-CHEK FASTCLIX LANCETS MISC 1 Units by Percutaneous route 4 (four) times daily. 05/24/17   Malachy Mood, MD  ACCU-CHEK SMARTVIEW test strip TEST BLOOD SUGAR FOUR TIMES DAILY AS DIRECTED 08/25/18   Malachy Mood, MD  albuterol (PROVENTIL HFA;VENTOLIN HFA) 108 (90 BASE) MCG/ACT inhaler Inhale 2 puffs into the lungs  every 6 (six) hours as needed. For shortness of breath    [provider]  ALPRAZolam Duanne Moron) 0.5 MG tablet alprazolam 0.5 mg tablet 01/27/18   [provider]  amitriptyline (ELAVIL) 25 MG tablet amitriptyline 25 mg tablet  take 1 tablet by mouth at bedtime for NERVE pain    [provider]  baclofen (LIORESAL) 10 MG tablet Take 10 mg by mouth 3 (three) times daily. 03/10/18   [provider]  blood glucose meter kit and supplies KIT Dispense based on patient and insurance preference. Use up to four times daily as directed. (FOR ICD-9 250.00, 250.01). 06/18/15   Ahmed Prima, MD  Blood  Glucose Monitoring Suppl (ACCU-CHEK NANO SMARTVIEW) w/Device KIT 1 kit by Subdermal route as directed. Check blood sugars for fasting, and two hours after breakfast, lunch and dinner (4 checks daily) 05/24/17   Malachy Mood, MD  cetirizine (ZYRTEC) 10 MG tablet Take 10 mg by mouth daily. 04/11/18   [provider]  clonazePAM (KLONOPIN) 1 MG tablet Take 1 mg by mouth 2 (two) times daily.    [provider]  famotidine (PEPCID) 20 MG tablet Take 1 tablet (20 mg total) by mouth 2 (two) times daily. 03/13/18   Law, Bea Graff, PA-C  ferrous sulfate 325 (65 FE) MG tablet Take 325 mg by mouth 2 (two) times daily.    [provider]  fluticasone Asencion Islam) 50 MCG/ACT nasal spray instill 2 sprays into each nostril once daily if needed 04/11/18   [provider]  gabapentin (NEURONTIN) 400 MG capsule Take 1 capsule (400 mg total) by mouth 3 (three) times daily. 03/13/18   Law, Bea Graff, PA-C  ibuprofen (ADVIL,MOTRIN) 600 MG tablet Take 1 tablet (600 mg total) by mouth every 6 (six) hours as needed. 10/14/17   Rexene Agent, CNM  metFORMIN (GLUCOPHAGE) 500 MG tablet Take 500 mg by mouth daily. 04/11/18   [provider]  methocarbamol (ROBAXIN) 500 MG tablet methocarbamol 500 mg tablet  take 1 to 2 tablets by mouth every 6 hours if needed for  muscle spasm 01/27/18   [provider]  nitrofurantoin, macrocrystal-monohydrate, (MACROBID) 100 MG capsule Take 1 capsule (100 mg total) by mouth 2 (two) times daily. 03/22/19   Fisher, Linden Dolin, PA-C  Norethindrone Acetate-Ethinyl Estrad-FE (MICROGESTIN 24 FE) 1-20 MG-MCG(24) tablet Take 1 tablet by mouth daily. Start pills 07/31/18 1/69/67   Copland, Elmo Putt B, PA-C  ondansetron (ZOFRAN) 4 MG tablet Take 1 tablet (4 mg total) by mouth every 6 (six) hours. 03/13/18   Law, Bea Graff, PA-C  ranitidine (ZANTAC) 150 MG tablet Take 1 tablet (150 mg total) by mouth at bedtime. 06/03/17   Will Bonnet, MD    Physical Exam Blood pressure 139/81, height 5' (1.524 m), weight 227 lb (103 kg), not currently breastfeeding. Body mass index is 44.33 kg/m. No LMP recorded. (Menstrual status: Irregular Periods). Body mass index is 44.33 kg/m.  General: NAD, well nourished, appears stated age 21: normocephalic, anicteric Pulmonary: No increased work of breathing Neurologic: Grossly intact Psychiatric: mood appropriate, affect full  Female chaperone present for pelvic portion of the physical exam  Assessment: 28 y.o. [id number] presenting with secondary amenorrhea  Plan: Problem List Items Addressed This Visit    None    Visit Diagnoses    Secondary amenorrhea    -  Primary   Relevant Orders   TSH+Prl+FSH+TestT+LH+DHEA S...   US Transvaginal Non-OB   PCOS (polycystic ovarian syndrome)       Relevant Orders   TSH+Prl+FSH+TestT+LH+DHEA S...   US Transvaginal Non-OB      1) The risk long term risk of amenorrhea were discussed with the patient.  The role of unopposed estrogen in causing amenorrhea and the possible development of endometrial hyperplasia or carcinoma is discussed.  The risk of endometrial hyperplasia is linearly correlated with increasing BMI given the production of estrone by adipose tissue.    2) Although amenorrhea is the patients presenting complaint, we discussed  that rather than simply addressing her symptom the goal of our work up would be aimed at identifying the underlying cause.  Disruption in the axis of any number of  endocrine systems may evidence itself in the form of amenorrhea.  3) Patient will be started on a 10 day provera challenge to assess for the presence of estrogen and rule out outlet obstruction if normal labs and normal ultrasound   4) Ultrasound ordered for endometrial strip assessment and evaluation of ovaries  5) PAP - will update at time of next visit  5) Return in about 2 weeks (around 05/31/2019) for 1-2 week TVUS and follow up.   Malachy Mood, MD, [facility], [facility] 05/17/2019, 2:52 PM

## 2019-05-19 LAB — TSH+PRL+FSH+TESTT+LH+DHEA S...
17-Hydroxyprogesterone: 40 ng/dL
Androstenedione: 227 ng/dL (ref 41–262)
DHEA-SO4: 227 ug/dL (ref 84.8–378.0)
FSH: 5.4 m[IU]/mL
LH: 9.3 m[IU]/mL
Prolactin: 8.4 ng/mL (ref 4.8–23.3)
TSH: 2.42 u[IU]/mL (ref 0.450–4.500)
Testosterone, Free: 2.2 pg/mL (ref 0.0–4.2)
Testosterone: 29 ng/dL (ref 8–48)

## 2019-06-06 ENCOUNTER — Other Ambulatory Visit: Payer: Medicaid Other

## 2019-06-06 ENCOUNTER — Ambulatory Visit: Payer: Medicaid Other | Admitting: Obstetrics and Gynecology

## 2019-06-13 ENCOUNTER — Other Ambulatory Visit: Payer: Self-pay

## 2019-06-13 ENCOUNTER — Ambulatory Visit (INDEPENDENT_AMBULATORY_CARE_PROVIDER_SITE_OTHER): Payer: Medicaid Other | Admitting: Obstetrics and Gynecology

## 2019-06-13 ENCOUNTER — Ambulatory Visit (INDEPENDENT_AMBULATORY_CARE_PROVIDER_SITE_OTHER): Payer: Medicaid Other

## 2019-06-13 ENCOUNTER — Encounter: Payer: Self-pay | Admitting: Obstetrics and Gynecology

## 2019-06-13 VITALS — BP 106/60 | HR 81 | Ht 60.0 in | Wt 227.0 lb

## 2019-06-13 DIAGNOSIS — E282 Polycystic ovarian syndrome: Secondary | ICD-10-CM | POA: Diagnosis not present

## 2019-06-13 DIAGNOSIS — N911 Secondary amenorrhea: Secondary | ICD-10-CM | POA: Diagnosis not present

## 2019-06-13 MED ORDER — NORGESTIMATE-ETH ESTRADIOL 0.25-35 MG-MCG PO TABS: 1.0000 | ORAL_TABLET | Freq: Every day | ORAL | 11 refills | Status: DC

## 2019-06-13 NOTE — Progress Notes (Signed)
Gynecology Ultrasound Follow Up  Chief Complaint:  Chief Complaint  Patient presents with   Follow-up    GYN Ultrasound     History of Present Illness: Patient is a 28 y.o. female who presents today for ultrasound evaluation of secondary amenorrhea.  Ultrasound demonstrates the following findgins Adnexa: no masses seen, ovaries do appear polycystic Uterus: Non-enlarged, no fibroids with endometrial stripe 3.95m Additional: no free fluid  Review of Systems: Review of Systems  Constitutional: Negative.   Gastrointestinal: Negative.   Genitourinary: Negative.     Past Medical History:  Past Medical History:  Diagnosis Date   ADHD    Anemia    BLOOD TRANSFUSION AFTER C-SECTION ON 06-2016-2 UNITS   Anxiety    Asthma    WELL CONTROLLED   Bipolar 1 disorder, depressed ([facility]    Depression    Diabetes mellitus without complication (HCC)    Gallstones    GERD (gastroesophageal reflux disease)    History of blood transfusion 06/2016   RECEIVED 2 UNITS OF BLOOD AFTER C SECTION   Migraines    Ovarian cyst    [place]    LAST SEIZURE 2016   Tubal pregnancy     Past Surgical History:  Past Surgical History:  Procedure Laterality Date   CESAREAN SECTION N/A 06/22/2016   Procedure: CESAREAN SECTION;  Surgeon: AMalachy Mood MD;  Location: [facility] ORS;  Service: Obstetrics;  Laterality: N/A;   CESAREAN SECTION N/A 10/12/2017   Procedure: CESAREAN SECTION;  Surgeon: SMalachy Mood MD;  Location: [facility] OR[facility];  Service: Obstetrics;  Laterality: N/A;   CHOLECYSTECTOMY N/A 08/13/2016   Procedure: LAPAROSCOPIC CHOLECYSTECTOMY;  Surgeon: DJules Husbands MD;  Location: [facility] ORS;  Service: General;  Laterality: N/A;   DILATION AND CURETTAGE OF UTERUS     ovarian cyst removed     SALPINGECTOMY Right 2013   ectopic pregnancy. PER PATIENT, STILL HAS BOTH TUBES   TONSILLECTOMY     TONSILLECTOMY AND ADENOIDECTOMY      Gynecologic History:  No LMP recorded.  (Menstrual status: Irregular Periods). Contraception: none Last Pap: 08/06/2016 Results were: .no abnormalities  Family History:  Family History  Problem Relation Age of Onset   Heart disease Father    Huntington's disease Mother     Social History:  Social History   Socioeconomic History   Marital status: Single    Spouse name: Not on file   Number of children: Not on file   Years of education: Not on file   Highest education level: Not on file  Occupational History   Not on file  Social Needs   Financial resource strain: Not on file   Food insecurity    Worry: Not on file    Inability: Not on file   Transportation needs    Medical: Not on file    Non-medical: Not on file  Tobacco Use   Smoking status: Current Every Day Smoker    Packs/day: 0.50    Years: 3.00    Pack years: 1.50    Types: Cigarettes   Smokeless tobacco: Never Used  Substance and Sexual Activity   Alcohol use: No    Alcohol/week: 0.0 standard drinks   Drug use: No   Sexual activity: Yes    Birth control/protection: None  Lifestyle   Physical activity    Days per week: Not on file    Minutes per session: Not on file   Stress: Not on file  Relationships   Social connections  Talks on phone: Not on file    Gets together: Not on file    Attends religious service: Not on file    Active member of club or organization: Not on file    Attends meetings of clubs or organizations: Not on file    Relationship status: Not on file   Intimate partner violence    Fear of current or ex partner: Not on file    Emotionally abused: Not on file    Physically abused: Not on file    Forced sexual activity: Not on file  Other Topics Concern   Not on file  Social History Narrative   Not on file    Allergies:  Allergies  Allergen Reactions   Ivp Dye [Iodinated Diagnostic Agents] Anaphylaxis    Per patient, she does not have problems with betadine   Latex Rash   Metrizamide  Anaphylaxis   Morphine Other (See Comments)    bradycradia    Mushroom Extract Complex Anaphylaxis    Hives then swelling of throat and can not breath   Sulfasalazine Hives   Tomato Anaphylaxis   Toradol [Ketorolac Tromethamine] Anaphylaxis, Hives and Swelling   Aspirin Hives   Dilaudid [Hydromorphone Hcl] Hives   Lamictal [Lamotrigine] Hives   Sulfa Antibiotics Hives   Tramadol Swelling, Rash and Hives   Adhesive [Tape] Itching    USE PAPER TAPE   Iohexol Rash    (contrast dye) causes red bumps    Medications: Prior to Admission medications   Medication Sig Start Date End Date Taking? Authorizing Provider  ACCU-CHEK FASTCLIX LANCETS MISC 1 Units by Percutaneous route 4 (four) times daily. 05/24/17  Yes Malachy Mood, MD  ACCU-CHEK SMARTVIEW test strip TEST BLOOD SUGAR FOUR TIMES DAILY AS DIRECTED 08/25/18  Yes Malachy Mood, MD  albuterol (PROVENTIL HFA;VENTOLIN HFA) 108 (90 BASE) MCG/ACT inhaler Inhale 2 puffs into the lungs every 6 (six) hours as needed. For shortness of breath   Yes [provider]  ALPRAZolam Duanne Moron) 0.5 MG tablet alprazolam 0.5 mg tablet 01/27/18  Yes [provider]  blood glucose meter kit and supplies KIT Dispense based on patient and insurance preference. Use up to four times daily as directed. (FOR ICD-9 250.00, 250.01). 06/18/15  Yes Ahmed Prima, MD  Blood Glucose Monitoring Suppl (ACCU-CHEK NANO SMARTVIEW) w/Device KIT 1 kit by Subdermal route as directed. Check blood sugars for fasting, and two hours after breakfast, lunch and dinner (4 checks daily) 05/24/17  Yes Malachy Mood, MD  cetirizine (ZYRTEC) 10 MG tablet Take 10 mg by mouth daily. 04/11/18  Yes [provider]  ferrous sulfate 325 (65 FE) MG tablet Take 325 mg by mouth 2 (two) times daily.   Yes [provider]  fluticasone (FLONASE) 50 MCG/ACT nasal spray instill 2 sprays into each nostril once daily if needed 04/11/18  Yes [provider]  ibuprofen (ADVIL,MOTRIN) 600 MG tablet Take 1 tablet (600 mg total) by mouth every 6 (six) hours as needed. 10/14/17  Yes Rexene Agent, CNM  metFORMIN (GLUCOPHAGE) 500 MG tablet Take 500 mg by mouth daily. 04/11/18  Yes [provider]  methocarbamol (ROBAXIN) 500 MG tablet methocarbamol 500 mg tablet  take 1 to 2 tablets by mouth every 6 hours if needed for muscle spasm 01/27/18  Yes [provider]  norgestimate-ethinyl estradiol (ORTHO-CYCLEN) 0.25-35 MG-MCG tablet Take 1 tablet by mouth daily. 06/13/19   Malachy Mood, MD    Physical Exam Vitals: Blood pressure 106/60, pulse 81,  height 5' (1.524 m), weight 227 lb (103 kg), not currently breastfeeding.  General: NAD, obese, appears states age 12: normocephalic, anicteric Pulmonary: No increased work of breathing Extremities: no edema, erythema, or tenderness Neurologic: Grossly intact, normal gait Psychiatric: mood appropriate, affect full  US Transvaginal Non-ob  Result Date: 06/13/2019 Patient Name: Faith [name] DOB: November 21, 1990 MRN: [id number] ULTRASOUND REPORT Location: [facility] Date of Service: 06/13/2019 Indications:  amenorrhea Findings: The uterus is anteverted and measures 8.6 x 5.0 x 3.6 cm. Echo texture is homogenous without evidence of focal masses. The Endometrium measures 3.1 mm. There is 2.7 mm AP of anechoic fluid within the endometrial canal. There is 3.3 mm AP of anechoic fluid within the cervix. Right Ovary measures 3.5 x 2.0 x 2.8 cm. It is normal in appearance. Left Ovary measures 3.3 x 2.8 x 2.4 cm. It is normal in appearance. Survey of the adnexa demonstrates no adnexal masses. There is no free fluid in the cul de sac. Impression: 1. The endometrium is thin. 2. There is a small amount of anechoic fluid in the endometrial canal and cervical canal. 3. Normal ovaries. Recommendations: 1.Clinical correlation with the patient's History and Physical Exam. Gweneth Dimitri, RT Images  reviewed.  The ovaries appear to contain multiple small follicles consistent with the patient history of PCOS.  Free fluid in the endometrial canal and cervix is non-diagnostic.  Malachy Mood, MD, [facility], [facility] 06/13/2019, 3:49 PM    Representative image showing multiple peripheral follicles  Assessment: 28 y.o. [id number] follow up secondary amenorrhea Plan: Problem List Items Addressed This Visit    None    Visit Diagnoses    Secondary amenorrhea    -  Primary   PCOS (polycystic ovarian syndrome)          1) Secondary amenorrhea - in the setting of prior diagnosis of PCOS with ovulation induction with clomid, as well as depo provera use suspect PCOS.  PCOS panel reviewed with patient and normal other than FSH/LH ratio.  Ultrasound today normal. - start ortho cyclen as not currently trying to conceive - follow up in 3 months to verify regulation in cycles  2) A total of 15 minutes were spent in face-to-face contact with the patient during this encounter with over half of that time devoted to counseling and coordination of care.  3) Return in about 3 months (around 09/12/2019) for medication follow up.    Malachy Mood, MD, [facility], [facility] 06/13/2019, 3:55 PM

## 2019-08-30 ENCOUNTER — Telehealth: Payer: Self-pay | Admitting: Obstetrics and Gynecology

## 2019-08-30 ENCOUNTER — Other Ambulatory Visit: Payer: Self-pay | Admitting: Obstetrics and Gynecology

## 2019-08-30 DIAGNOSIS — O3680X0 Pregnancy with inconclusive fetal viability, not applicable or unspecified: Secondary | ICD-10-CM

## 2019-08-30 NOTE — Telephone Encounter (Signed)
Patient is schedule 09/04/19 for labs

## 2019-08-30 NOTE — Telephone Encounter (Signed)
Patient is calling with the complaint of possible miscarriage. Patient reports she was seen in urgent care was told it was a molar pregnancy HCG level was 6.5. No openings for the next two weeks. Please advise work in

## 2019-08-30 NOTE — Telephone Encounter (Signed)
I'd request records a molar pregnancy would not have an HCG of 6.5.  We can check an HCG sometime next week order is in

## 2019-09-04 ENCOUNTER — Other Ambulatory Visit: Payer: Self-pay

## 2019-09-04 ENCOUNTER — Other Ambulatory Visit: Payer: Medicaid Other

## 2019-09-04 DIAGNOSIS — O3680X0 Pregnancy with inconclusive fetal viability, not applicable or unspecified: Secondary | ICD-10-CM

## 2019-09-05 ENCOUNTER — Telehealth: Payer: Self-pay | Admitting: Obstetrics and Gynecology

## 2019-09-05 LAB — BETA HCG QUANT (REF LAB): hCG Quant: <1 m[IU]/mL

## 2019-09-05 NOTE — Telephone Encounter (Signed)
There is nothing tfuther to do her pregnancy test is negative, the lab was ordered because she was told she might have a molar pregnancy at an urgent care.  Clearly she does not.  So unless she wants to make an appointment to discuss birth control to regulate her cycles no further follow up is needed

## 2019-09-05 NOTE — Telephone Encounter (Signed)
Patient is calling to find out what the next step is for her treatment. Patient reports she has received results. Please advise

## 2019-09-05 NOTE — Telephone Encounter (Signed)
Routing to [employer] for assistance

## 2019-09-05 NOTE — Telephone Encounter (Signed)
Pt aware.

## 2020-02-02 ENCOUNTER — Other Ambulatory Visit: Payer: Self-pay

## 2020-02-02 ENCOUNTER — Ambulatory Visit: Payer: Medicaid Other | Admitting: Obstetrics and Gynecology

## 2020-02-08 ENCOUNTER — Encounter: Payer: Self-pay | Admitting: Obstetrics and Gynecology

## 2020-02-08 ENCOUNTER — Other Ambulatory Visit: Payer: Self-pay

## 2020-02-08 ENCOUNTER — Ambulatory Visit (INDEPENDENT_AMBULATORY_CARE_PROVIDER_SITE_OTHER): Payer: Medicaid Other | Admitting: Obstetrics and Gynecology

## 2020-02-08 VITALS — BP 109/64 | HR 77 | Ht 60.0 in | Wt 203.0 lb

## 2020-02-08 DIAGNOSIS — N970 Female infertility associated with anovulation: Secondary | ICD-10-CM | POA: Diagnosis not present

## 2020-02-08 NOTE — Progress Notes (Signed)
Obstetrics & Gynecology Office Visit   Chief Complaint  Patient presents with  . ER follow up    miscarriage    History of Present Illness: 29 y.o. [id number] female who presents in follow up from a miscarriage about 1 week ago. She also had some cystic areas noted on her ovaries consistent with follicles.  She would like to attempt to conceive again with Clomid, which she has tried in the past. She has no acute issues today.  She is still bleeding some.  She believes she probably miscarried about 2 weeks ago.     Past Medical History:  Diagnosis Date  . ADHD   . Anemia    BLOOD TRANSFUSION AFTER C-SECTION ON 06-2016-2 UNITS  . Anxiety   . Asthma    WELL CONTROLLED  . Bipolar 1 disorder, depressed ([place])   . Depression   . Diabetes mellitus without complication ([place])   . Gallstones   . GERD (gastroesophageal reflux disease)   . History of blood transfusion 06/2016   RECEIVED 2 UNITS OF BLOOD AFTER C SECTION  . Migraines   . Ovarian cyst   . Seizures ([place])    LAST SEIZURE 2016  . Tubal pregnancy     Past Surgical History:  Procedure Laterality Date  . CESAREAN SECTION N/A 06/22/2016   Procedure: CESAREAN SECTION;  Surgeon: Malachy Mood, MD;  Location: [facility];  Service: Obstetrics;  Laterality: N/A;  . CESAREAN SECTION N/A 10/12/2017   Procedure: CESAREAN SECTION;  Surgeon: Malachy Mood, MD;  Location: [facility];  Service: Obstetrics;  Laterality: N/A;  . CHOLECYSTECTOMY N/A 08/13/2016   Procedure: LAPAROSCOPIC CHOLECYSTECTOMY;  Surgeon: Jules Husbands, MD;  Location: [facility] ORS;  Service: General;  Laterality: N/A;  . DILATION AND CURETTAGE OF UTERUS    . ovarian cyst removed    . SALPINGECTOMY Right 2013   ectopic pregnancy. PER PATIENT, STILL HAS BOTH TUBES  . TONSILLECTOMY    . TONSILLECTOMY AND ADENOIDECTOMY      Gynecologic History: No LMP recorded. (Menstrual status: Irregular Periods).  Obstetric History: [id number]  Family History  Problem Relation Age of  Onset  . Heart disease Father   . Huntington's disease Mother     Social History   Socioeconomic History  . Marital status: Single    Spouse name: Not on file  . Number of children: Not on file  . Years of education: Not on file  . Highest education level: Not on file  Occupational History  . Not on file  Tobacco Use  . Smoking status: Current Every Day Smoker    Packs/day: 0.50    Years: 3.00    Pack years: 1.50    Types: Cigarettes  . Smokeless tobacco: Never Used  Substance and Sexual Activity  . Alcohol use: No    Alcohol/week: 0.0 standard drinks  . Drug use: No  . Sexual activity: Yes    Birth control/protection: None  Other Topics Concern  . Not on file  Social History Narrative  . Not on file   Social Determinants of Health   Financial Resource Strain:   . Difficulty of Paying Living Expenses:   Food Insecurity:   . Worried About Charity fundraiser in the Last Year:   . Arboriculturist in the Last Year:   Transportation Needs:   . Film/video editor (Medical):   Marland Kitchen Lack of Transportation (Non-Medical):   Physical Activity:   . Days of Exercise per Week:   .  Minutes of Exercise per Session:   Stress:   . Feeling of Stress :   Social Connections:   . Frequency of Communication with Friends and Family:   . Frequency of Social Gatherings with Friends and Family:   . Attends Religious Services:   . Active Member of Clubs or Organizations:   . Attends Archivist Meetings:   Marland Kitchen Marital Status:   Intimate Partner Violence:   . Fear of Current or Ex-Partner:   . Emotionally Abused:   Marland Kitchen Physically Abused:   . Sexually Abused:     Allergies  Allergen Reactions  . Ivp Dye [Iodinated Diagnostic Agents] Anaphylaxis    Per patient, she does not have problems with betadine  . Latex Rash  . Metrizamide Anaphylaxis  . Morphine Other (See Comments)    bradycradia   . Mushroom Extract Complex Anaphylaxis    Hives then swelling of throat and can  not breath  . Sulfasalazine Hives  . Tomato Anaphylaxis  . Toradol [Ketorolac Tromethamine] Anaphylaxis, Hives and Swelling  . Aspirin Hives  . Dilaudid [Hydromorphone Hcl] Hives  . Lamictal [Lamotrigine] Hives  . Sulfa Antibiotics Hives  . Tramadol Swelling, Rash and Hives  . Adhesive [Tape] Itching    USE PAPER TAPE  . Iohexol Rash    (contrast dye) causes red bumps    Prior to Admission medications   Medication Sig Start Date End Date Taking? Authorizing Provider  ACCU-CHEK FASTCLIX LANCETS MISC 1 Units by Percutaneous route 4 (four) times daily. 05/24/17  Yes Malachy Mood, MD  ACCU-CHEK SMARTVIEW test strip TEST BLOOD SUGAR FOUR TIMES DAILY AS DIRECTED 08/25/18  Yes Malachy Mood, MD  albuterol (PROVENTIL HFA;VENTOLIN HFA) 108 (90 BASE) MCG/ACT inhaler Inhale 2 puffs into the lungs every 6 (six) hours as needed. For shortness of breath   Yes [provider]  ALPRAZolam Duanne Moron) 0.5 MG tablet alprazolam 0.5 mg tablet 01/27/18  Yes [provider]  blood glucose meter kit and supplies KIT Dispense based on patient and insurance preference. Use up to four times daily as directed. (FOR ICD-9 250.00, 250.01). 06/18/15  Yes Ahmed Prima, MD  Blood Glucose Monitoring Suppl (ACCU-CHEK NANO SMARTVIEW) w/Device KIT 1 kit by Subdermal route as directed. Check blood sugars for fasting, and two hours after breakfast, lunch and dinner (4 checks daily) 05/24/17  Yes Malachy Mood, MD  cetirizine (ZYRTEC) 10 MG tablet Take 10 mg by mouth daily. 04/11/18  Yes [provider]  fluticasone (FLONASE) 50 MCG/ACT nasal spray instill 2 sprays into each nostril once daily if needed 04/11/18  Yes [provider]  ibuprofen (ADVIL,MOTRIN) 600 MG tablet Take 1 tablet (600 mg total) by mouth every 6 (six) hours as needed. 10/14/17  Yes Rexene Agent, CNM  metFORMIN (GLUCOPHAGE) 500 MG tablet Take 500 mg by mouth daily. 04/11/18  Yes [provider]     Review of Systems  Constitutional: Negative.   HENT: Negative.   Eyes: Negative.   Respiratory: Negative.   Cardiovascular: Negative.   Gastrointestinal: Negative.   Genitourinary: Negative.   Musculoskeletal: Negative.   Skin: Negative.   Neurological: Negative.   Psychiatric/Behavioral: Negative.      Physical Exam BP 109/64   Pulse 77   Ht 5' (1.524 m)   Wt 203 lb (92.1 kg)   BMI 39.65 kg/m  No LMP recorded. (Menstrual status: Irregular Periods). Physical Exam Constitutional:      General: She is not in acute distress.  Appearance: Normal appearance.  HENT:     Head: Normocephalic and atraumatic.  Eyes:     General: No scleral icterus.    Conjunctiva/sclera: Conjunctivae normal.  Neurological:     General: No focal deficit present.     Mental Status: She is alert and oriented to person, place, and time.     Cranial Nerves: No cranial nerve deficit.  Psychiatric:        Mood and Affect: Mood normal.        Behavior: Behavior normal.        Judgment: Judgment normal.     Female chaperone present for pelvic and breast  portions of the physical exam  Assessment: 29 y.o. [id number] female here for  1. Infertility, anovulation      Plan: Problem List Items Addressed This Visit    None    Visit Diagnoses    Infertility, anovulation    -  Primary    start clomid with next cycle. SHe will advise me when it begins.   A total of 20 minutes were spent face-to-face with the patient as well as preparation, review, communication, and documentation during this encounter.    Prentice Docker, MD 02/08/2020 5:55 PM

## 2020-02-09 ENCOUNTER — Encounter: Payer: Self-pay | Admitting: Obstetrics and Gynecology

## 2020-02-15 ENCOUNTER — Encounter: Payer: Medicaid Other | Admitting: Obstetrics and Gynecology

## 2020-03-11 LAB — FETAL NONSTRESS TEST

## 2020-03-23 ENCOUNTER — Other Ambulatory Visit: Payer: Self-pay

## 2020-03-23 ENCOUNTER — Emergency Department
Admission: EM | Admit: 2020-03-23 | Discharge: 2020-03-23 | Disposition: A | Discharge status: Home / Self Care | Payer: Medicaid Other | Attending: Emergency Medicine | Admitting: Emergency Medicine

## 2020-03-23 ENCOUNTER — Emergency Department: Payer: Medicaid Other

## 2020-03-23 DIAGNOSIS — R404 Transient alteration of awareness: Secondary | ICD-10-CM

## 2020-03-23 DIAGNOSIS — R0789 Other chest pain: Secondary | ICD-10-CM | POA: Diagnosis not present

## 2020-03-23 DIAGNOSIS — Z79899 Other long term (current) drug therapy: Secondary | ICD-10-CM | POA: Insufficient documentation

## 2020-03-23 DIAGNOSIS — F319 Bipolar disorder, unspecified: Secondary | ICD-10-CM | POA: Diagnosis not present

## 2020-03-23 DIAGNOSIS — E119 Type 2 diabetes mellitus without complications: Secondary | ICD-10-CM | POA: Diagnosis not present

## 2020-03-23 DIAGNOSIS — Z7984 Long term (current) use of oral hypoglycemic drugs: Secondary | ICD-10-CM | POA: Diagnosis not present

## 2020-03-23 DIAGNOSIS — R079 Chest pain, unspecified: Secondary | ICD-10-CM

## 2020-03-23 DIAGNOSIS — R55 Syncope and collapse: Secondary | ICD-10-CM | POA: Diagnosis not present

## 2020-03-23 LAB — BASIC METABOLIC PANEL
Anion gap: 9 (ref 5–15)
BUN: 8 mg/dL (ref 6–20)
CO2: 23 mmol/L (ref 22–32)
Calcium: 8.8 mg/dL — ABNORMAL LOW (ref 8.9–10.3)
Chloride: 106 mmol/L (ref 98–111)
Creatinine, Ser: 0.67 mg/dL (ref 0.44–1.00)
GFR calc Af Amer: >60 mL/min (ref >60)
GFR calc non Af Amer: >60 mL/min (ref >60)
Glucose, Bld: 102 mg/dL — ABNORMAL HIGH (ref 70–99)
Potassium: 3.8 mmol/L (ref 3.5–5.1)
Sodium: 138 mmol/L (ref 135–145)

## 2020-03-23 LAB — URINALYSIS, COMPLETE (UACMP) WITH MICROSCOPIC
Bacteria, UA: NONE SEEN
Bilirubin Urine: NEGATIVE
Glucose, UA: NEGATIVE mg/dL
Hgb urine dipstick: NEGATIVE
Ketones, ur: NEGATIVE mg/dL
Leukocytes,Ua: NEGATIVE
Nitrite: NEGATIVE
Protein, ur: NEGATIVE mg/dL
Specific Gravity, Urine: 1.023 (ref 1.005–1.030)
WBC, UA: NONE SEEN WBC/hpf (ref 0–5)
pH: 7 (ref 5.0–8.0)

## 2020-03-23 LAB — CBC
HCT: 41.2 % (ref 36.0–46.0)
Hemoglobin: 12.9 g/dL (ref 12.0–15.0)
MCH: 24.4 pg — ABNORMAL LOW (ref 26.0–34.0)
MCHC: 31.3 g/dL (ref 30.0–36.0)
MCV: 77.9 fL — ABNORMAL LOW (ref 80.0–100.0)
Platelets: 362 10*3/uL (ref 150–400)
RBC: 5.29 MIL/uL — ABNORMAL HIGH (ref 3.87–5.11)
RDW: 14.3 % (ref 11.5–15.5)
WBC: 12.3 10*3/uL — ABNORMAL HIGH (ref 4.0–10.5)
nRBC: 0 % (ref 0.0–0.2)

## 2020-03-23 LAB — URINE DRUG SCREEN, QUALITATIVE (ARMC ONLY)
Amphetamines, Ur Screen: NOT DETECTED
Barbiturates, Ur Screen: NOT DETECTED
Benzodiazepine, Ur Scrn: NOT DETECTED
Cannabinoid 50 Ng, Ur ~~LOC~~: NOT DETECTED
Cocaine Metabolite,Ur ~~LOC~~: POSITIVE — AB
MDMA (Ecstasy)Ur Screen: NOT DETECTED
Methadone Scn, Ur: NOT DETECTED
Opiate, Ur Screen: NOT DETECTED
Phencyclidine (PCP) Ur S: NOT DETECTED
Tricyclic, Ur Screen: NOT DETECTED

## 2020-03-23 LAB — TROPONIN I (HIGH SENSITIVITY): Troponin I (High Sensitivity): 4 ng/L (ref <18)

## 2020-03-23 MED ORDER — SODIUM CHLORIDE 0.9 % IV BOLUS: 1000.0000 mL | Freq: Once | INTRAVENOUS | Status: DC

## 2020-03-23 NOTE — ED Notes (Signed)
Reviewed discharge instructions and follow-up care with patient. Patient verbalized understanding of all information reviewed. Patient stable, with no distress noted at this time.    

## 2020-03-23 NOTE — Discharge Instructions (Signed)
You have been seen today in the Emergency Department (ED)  for syncope (passing out).  Your workup including labs and EKG show reassuring results.  Please drink plenty of clear fluids such as water and avoid any drug and alcohol use.  Take your regular medications.  Please call your regular doctor as soon as possible to schedule the next available clinic appointment to follow up with him/her regarding your visit to the ED and your symptoms.  Return to the Emergency Department (ED)  if you have any further syncopal episodes (pass out again) or develop ANY chest pain, pressure, tightness, trouble breathing, sudden sweating, or other symptoms that concern you.
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
## 2020-03-23 NOTE — ED Provider Notes (Signed)
[facility] Emergency Department Provider Note  ____________________________________________   First MD Initiated Contact with Patient 03/23/20 [phone]     (approximate)  I have reviewed the triage vital signs and the nursing notes.   HISTORY  Chief Complaint Seizures and Chest Pain    HPI Faith [name] is a 29 y.o. female with medical and psychiatric history as listed below who presents by EMS for evaluation of decreased level of responsiveness and possible seizure activity.  Her seizure history is somewhat unclear and she does not currently take medication for it.  Reportedly from EMS she was  at home and her roommate said that she had multiple episodes where she was not responding.  There is no specific report of shaking or seizure-like activity.  Nothing in particular made the symptoms better or worse.  She was minimally responsive versus minimally cooperative with EMS.  She initially told the paramedics that she was having chest pain.  With me she seems to be at her baseline mental status.  She is in no distress and using her phone and speaking with her sister at the bedside.  She denies any pain to me.  She thinks she might have bitten her tongue but she is denying headache, neck pain, chest pain, shortness of breath, nausea, vomiting, and abdominal pain.  She states that "this happens when I get too hot".  She is unclear about what she was doing when she got too hot and she believes that she passed out.          Past Medical History:  Diagnosis Date  . ADHD   . Anemia    BLOOD TRANSFUSION AFTER C-SECTION ON 06-2016-2 UNITS  . Anxiety   . Asthma    WELL CONTROLLED  . Bipolar 1 disorder, depressed ([place])   . Depression   . Diabetes mellitus without complication ([place])   . Gallstones   . GERD (gastroesophageal reflux disease)   . History of blood transfusion 06/2016   RECEIVED 2 UNITS OF BLOOD AFTER C SECTION  . Migraines   . Ovarian cyst   .  Seizures ([place])    LAST SEIZURE 2016  . Tubal pregnancy     Patient Active Problem List   Diagnosis Date Noted  . Hidradenitis suppurativa 05/24/2018  . LLQ pain 05/24/2018  . Encounter for maternal care for low transverse scar from previous cesarean delivery 10/12/2017  . Bipolar disorder ([place]) 10/01/2017  . History of cesarean delivery 07/12/2017  . Family history of Huntington's disease   . Asthma affecting pregnancy, antepartum 05/10/2017  . Smoking (tobacco) complicating pregnancy, unspecified trimester 05/10/2017  . Seizure disorder during pregnancy ([place]) 05/10/2017  . History of anemia 04/19/2017  . History of maternal blood transfusion, currently pregnant 04/19/2017  . Supervision of high risk pregnancy, antepartum 03/02/2017  . Diabetes mellitus during pregnancy, antepartum 01/16/2016  . ADD (attention deficit disorder) 07/17/2015  . Post traumatic stress disorder (PTSD) 06/16/2013  . Depression 06/15/2013  . Suicidal ideation 06/15/2013  . Status epilepticus ([place]) 06/14/2013  . Diabetes mellitus type 2 in obese ([place]) 06/14/2013    Past Surgical History:  Procedure Laterality Date  . CESAREAN SECTION N/A 06/22/2016   Procedure: CESAREAN SECTION;  Surgeon: Malachy Mood, MD;  Location: [facility] ORS;  Service: Obstetrics;  Laterality: N/A;  . CESAREAN SECTION N/A 10/12/2017   Procedure: CESAREAN SECTION;  Surgeon: Malachy Mood, MD;  Location: [facility] ORS;  Service: Obstetrics;  Laterality: N/A;  . CHOLECYSTECTOMY N/A 08/13/2016  Procedure: LAPAROSCOPIC CHOLECYSTECTOMY;  Surgeon: Jules Husbands, MD;  Location: [facility] ORS;  Service: General;  Laterality: N/A;  . DILATION AND CURETTAGE OF UTERUS    . ovarian cyst removed    . SALPINGECTOMY Right 2013   ectopic pregnancy. PER PATIENT, STILL HAS BOTH TUBES  . TONSILLECTOMY    . TONSILLECTOMY AND ADENOIDECTOMY      Prior to Admission medications   Medication Sig Start Date End Date Taking? Authorizing Provider  ACCU-CHEK  FASTCLIX LANCETS MISC 1 Units by Percutaneous route 4 (four) times daily. 05/24/17   Malachy Mood, MD  ACCU-CHEK SMARTVIEW test strip TEST BLOOD SUGAR FOUR TIMES DAILY AS DIRECTED 08/25/18   Malachy Mood, MD  albuterol (PROVENTIL HFA;VENTOLIN HFA) 108 (90 BASE) MCG/ACT inhaler Inhale 2 puffs into the lungs every 6 (six) hours as needed. For shortness of breath    [provider]  ALPRAZolam Duanne Moron) 0.5 MG tablet alprazolam 0.5 mg tablet 01/27/18   [provider]  blood glucose meter kit and supplies KIT Dispense based on patient and insurance preference. Use up to four times daily as directed. (FOR ICD-9 250.00, 250.01). 06/18/15   Ahmed Prima, MD  Blood Glucose Monitoring Suppl (ACCU-CHEK NANO SMARTVIEW) w/Device KIT 1 kit by Subdermal route as directed. Check blood sugars for fasting, and two hours after breakfast, lunch and dinner (4 checks daily) 05/24/17   Malachy Mood, MD  cetirizine (ZYRTEC) 10 MG tablet Take 10 mg by mouth daily. 04/11/18   [provider]  fluticasone Asencion Islam) 50 MCG/ACT nasal spray instill 2 sprays into each nostril once daily if needed 04/11/18   [provider]  ibuprofen (ADVIL,MOTRIN) 600 MG tablet Take 1 tablet (600 mg total) by mouth every 6 (six) hours as needed. 10/14/17   Rexene Agent, CNM  metFORMIN (GLUCOPHAGE) 500 MG tablet Take 500 mg by mouth daily. 04/11/18   [provider]    Allergies Ivp dye [iodinated diagnostic agents], Latex, Metrizamide, Morphine, Mushroom extract complex, Sulfasalazine, Tomato, Toradol [ketorolac tromethamine], Aspirin, Dilaudid [hydromorphone hcl], Lamictal [lamotrigine], Sulfa antibiotics, Tramadol, Adhesive [tape], and Iohexol  Family History  Problem Relation Age of Onset  . Heart disease Father   . Huntington's disease Mother     Social History Social History   Tobacco Use  . Smoking status: Current Every Day Smoker    Packs/day: 0.50    Years: 3.00     Pack years: 1.50    Types: Cigarettes  . Smokeless tobacco: Never Used  Vaping Use  . Vaping Use: Never used  Substance Use Topics  . Alcohol use: No    Alcohol/week: 0.0 standard drinks  . Drug use: No    Review of Systems Constitutional: No fever/chills Eyes: No visual changes. ENT: No sore throat. Cardiovascular: Denies chest pain. Respiratory: Denies shortness of breath. Gastrointestinal: No abdominal pain.  No nausea, no vomiting.  No diarrhea.  No constipation. Genitourinary: Negative for dysuria. Musculoskeletal: Negative for neck pain.  Negative for back pain. Integumentary: Negative for rash. Neurological: Possible seizure-like activity denied by the patient.  Negative for headaches, focal weakness or numbness.   ____________________________________________   PHYSICAL EXAM:  VITAL SIGNS: ED Triage Vitals  Enc Vitals Group     BP 03/23/20 0315 117/82     Pulse Rate 03/23/20 0315 72     Resp 03/23/20 0315 18     Temp 03/23/20 0315 98.2 F (36.8 C)     Temp src --      SpO2 03/23/20  0315 98 %     Weight 03/23/20 0312 92 kg (202 lb 13.2 oz)     Height 03/23/20 0312 1.524 m (5')     Head Circumference --      Peak Flow --      Pain Score 03/23/20 0311 6     Pain Loc --      Pain Edu? --      Excl. in [place]? --     Constitutional: Alert and oriented.  Eyes: Conjunctivae are normal.  Head: Atraumatic. Nose: No congestion/rhinnorhea. Mouth/Throat: No evidence of intraoral injury.  No lacerations, ecchymoses, nor contusions to the tongue.  The tongue has a white film on it but without any trauma. Neck: No stridor.  No meningeal signs.  No cervical spine tenderness to palpation. Cardiovascular: Normal rate, regular rhythm. Good peripheral circulation. Grossly normal heart sounds. Respiratory: Normal respiratory effort.  No retractions. Gastrointestinal: Soft and nontender. No distention.  Musculoskeletal: No lower extremity tenderness nor edema. No gross  deformities of extremities. Neurologic:  Normal speech and language. No gross focal neurologic deficits are appreciated.  Skin:  Skin is warm, dry and intact. Psychiatric: Mood and affect are normal. Speech and behavior are normal.  ____________________________________________   LABS (all labs ordered are listed, but only abnormal results are displayed)  Labs Reviewed  BASIC METABOLIC PANEL - Abnormal; Notable for the following components:      Result Value   Glucose, Bld 102 (*)    Calcium 8.8 (*)    All other components within normal limits  CBC - Abnormal; Notable for the following components:   WBC 12.3 (*)    RBC 5.29 (*)    MCV 77.9 (*)    MCH 24.4 (*)    All other components within normal limits  URINE DRUG SCREEN, QUALITATIVE ([facility] ONLY) - Abnormal; Notable for the following components:   Cocaine Metabolite,[place] POSITIVE (*)    All other components within normal limits  URINALYSIS, COMPLETE (UACMP) WITH MICROSCOPIC - Abnormal; Notable for the following components:   Color, Urine YELLOW (*)    APPearance CLOUDY (*)    All other components within normal limits  TROPONIN I (HIGH SENSITIVITY)   ____________________________________________  EKG  ED ECG REPORT I, Hinda Kehr, the attending physician, personally viewed and interpreted this ECG.  Date: 03/23/2020 EKG Time: 3:13 AM Rate: 69 Rhythm: normal sinus rhythm with sinus arrhythmia QRS Axis: normal Intervals: normal ST/T Wave abnormalities: normal Narrative Interpretation: no evidence of acute ischemia  ____________________________________________  RADIOLOGY I, Hinda Kehr, personally viewed and evaluated these images (plain radiographs) as part of my medical decision making, as well as reviewing the written report by the radiologist.  ED MD interpretation: No acute abnormality on chest x-ray  Official radiology report(s): DG Chest Port 1 View  Result Date: 03/23/2020 CLINICAL DATA:  Chest pain.  EXAM: PORTABLE CHEST 1 VIEW COMPARISON:  03/13/2018 FINDINGS: The cardiomediastinal contours are normal. The lungs are clear. Pulmonary vasculature is normal. No consolidation, pleural effusion, or pneumothorax. No acute osseous abnormalities are seen. IMPRESSION: Negative frontal view of the chest. Electronically Signed   By: Keith Rake M.D.   On: 03/23/2020 04:01    ____________________________________________   PROCEDURES   Procedure(s) performed (including Critical Care):  Procedures   ____________________________________________   INITIAL IMPRESSION / MDM / [place] / ED COURSE  As part of my medical decision making, I reviewed the following data within the [place] History obtained from family,  Nursing notes reviewed and incorporated, Labs reviewed , EKG interpreted , Old chart reviewed, Radiograph reviewed , Notes from prior ED visits and [place] Controlled Substance Database   Differential diagnosis includes, but is not limited to, medication or drug side effect, vasovagal syncope, orthostatic hypotension, cardiac arrhythmia, seizure, acute intracranial bleeding.  Vital signs stable, EKG within normal limits, blood work is within normal limits except for very mild leukocytosis.  Urinalysis is normal but urine drug screen is positive for cocaine.  Patient denies any drug use and when I questioned her about it more extensively she did not respond.  However when I evaluated her, even before the discussion about drug use, she is denying any symptoms at this time and wants to go home.  She tolerated a ginger ale in the ED without difficulty.  Her sister is present in the room and is also comfortable with the plan for her to go home.  She is low risk for syncope based on [place] syncope rules and I think this was most likely a side effect to drug use but she is now back at her baseline.  I gave my usual recommendations and return precautions.            ____________________________________________  FINAL CLINICAL IMPRESSION(S) / ED DIAGNOSES  Final diagnoses:  Transient alteration of awareness  Syncope, unspecified syncope type     MEDICATIONS GIVEN DURING THIS VISIT:  Medications  sodium chloride 0.9 % bolus 1,000 mL (has no administration in time range)     ED Discharge Orders    None      *Please note:  CHARLEI RAMSARAN was evaluated in Emergency Department on 03/23/2020 for the symptoms described in the history of present illness. She was evaluated in the context of the global COVID-19 pandemic, which necessitated consideration that the patient might be at risk for infection with the SARS-CoV-2 virus that causes COVID-19. Institutional protocols and algorithms that pertain to the evaluation of patients at risk for COVID-19 are in a state of rapid change based on information released by regulatory bodies including the CDC and federal and state organizations. These policies and algorithms were followed during the patient's care in the ED.  Some ED evaluations and interventions may be delayed as a result of limited staffing during and after the pandemic.*  Note:  This document was prepared using Dragon voice recognition software and may include unintentional dictation errors.   Hinda Kehr, MD 03/23/20 [phone]

## 2020-03-23 NOTE — ED Triage Notes (Signed)
Patient c/o chest pain, head ache, and tongue pain.

## 2020-04-10 ENCOUNTER — Telehealth: Payer: Self-pay | Admitting: Obstetrics and Gynecology

## 2020-04-10 NOTE — Telephone Encounter (Signed)
Patient is calling requesting medication clomid to be sent in to her pharmacy [facility] on [address]. Please advise

## 2020-04-11 NOTE — Telephone Encounter (Signed)
Patient is calling to see if anyone could refill this medication since [facility] is out of the office. Please advise

## 2020-04-12 NOTE — Telephone Encounter (Signed)
Forwarding messade to Dr. Jean Rosenthal. Please advise on medication refill since Dr. Bonney Aid isn't in office this week

## 2020-04-13 NOTE — Telephone Encounter (Signed)
So I had not spoken with her about restarting clomid, she last saw Jean Rosenthal for infertility visit.  I'd have to have a visit with her to discuss her current diabetes control prior to starting clomid

## 2020-04-15 NOTE — Telephone Encounter (Signed)
Called and left voicemail for patient to call back to confirm scheduled appointment 

## 2020-04-23 ENCOUNTER — Ambulatory Visit: Payer: Medicaid Other | Admitting: Obstetrics and Gynecology

## 2020-05-29 ENCOUNTER — Emergency Department
Admission: EM | Admit: 2020-05-29 | Discharge: 2020-05-29 | Discharge status: Against Medical Advice | Payer: Medicaid Other

## 2020-05-29 NOTE — ED Notes (Signed)
Pt not willing to stay to be evaluated , pt has a visitor with her that is alert and oriented and are discussing whether or not to seek medical treatment. Pt alert , even unlabored respirations, no distress noted

## 2020-06-05 ENCOUNTER — Encounter: Payer: Self-pay | Admitting: Emergency Medicine

## 2020-06-05 ENCOUNTER — Emergency Department: Payer: Medicaid Other

## 2020-06-05 ENCOUNTER — Emergency Department
Admission: EM | Admit: 2020-06-05 | Discharge: 2020-06-05 | Disposition: A | Discharge status: Home / Self Care | Payer: Medicaid Other | Attending: Emergency Medicine | Admitting: Emergency Medicine

## 2020-06-05 ENCOUNTER — Other Ambulatory Visit: Payer: Self-pay

## 2020-06-05 DIAGNOSIS — Z5321 Procedure and treatment not carried out due to patient leaving prior to being seen by health care provider: Secondary | ICD-10-CM | POA: Insufficient documentation

## 2020-06-05 DIAGNOSIS — E119 Type 2 diabetes mellitus without complications: Secondary | ICD-10-CM | POA: Insufficient documentation

## 2020-06-05 DIAGNOSIS — R1110 Vomiting, unspecified: Secondary | ICD-10-CM | POA: Insufficient documentation

## 2020-06-05 DIAGNOSIS — R079 Chest pain, unspecified: Secondary | ICD-10-CM | POA: Insufficient documentation

## 2020-06-05 DIAGNOSIS — J45909 Unspecified asthma, uncomplicated: Secondary | ICD-10-CM | POA: Diagnosis not present

## 2020-06-05 LAB — CBC
HCT: 38 % (ref 36.0–46.0)
Hemoglobin: 11.6 g/dL — ABNORMAL LOW (ref 12.0–15.0)
MCH: 22.4 pg — ABNORMAL LOW (ref 26.0–34.0)
MCHC: 30.5 g/dL (ref 30.0–36.0)
MCV: 73.2 fL — ABNORMAL LOW (ref 80.0–100.0)
Platelets: 315 10*3/uL (ref 150–400)
RBC: 5.19 MIL/uL — ABNORMAL HIGH (ref 3.87–5.11)
RDW: 15.7 % — ABNORMAL HIGH (ref 11.5–15.5)
WBC: 7.8 10*3/uL (ref 4.0–10.5)
nRBC: 0 % (ref 0.0–0.2)

## 2020-06-05 LAB — COMPREHENSIVE METABOLIC PANEL
ALT: 10 U/L (ref 0–44)
AST: 11 U/L — ABNORMAL LOW (ref 15–41)
Albumin: 3.9 g/dL (ref 3.5–5.0)
Alkaline Phosphatase: 66 U/L (ref 38–126)
Anion gap: 8 (ref 5–15)
BUN: 7 mg/dL (ref 6–20)
CO2: 23 mmol/L (ref 22–32)
Calcium: 8.7 mg/dL — ABNORMAL LOW (ref 8.9–10.3)
Chloride: 103 mmol/L (ref 98–111)
Creatinine, Ser: 0.53 mg/dL (ref 0.44–1.00)
GFR calc Af Amer: >60 mL/min (ref >60)
GFR calc non Af Amer: >60 mL/min (ref >60)
Glucose, Bld: 92 mg/dL (ref 70–99)
Potassium: 4 mmol/L (ref 3.5–5.1)
Sodium: 134 mmol/L — ABNORMAL LOW (ref 135–145)
Total Bilirubin: 0.6 mg/dL (ref 0.3–1.2)
Total Protein: 7.1 g/dL (ref 6.5–8.1)

## 2020-06-05 LAB — TROPONIN I (HIGH SENSITIVITY): Troponin I (High Sensitivity): 3 ng/L (ref <18)

## 2020-06-05 LAB — LIPASE, BLOOD: Lipase: 24 U/L (ref 11–51)

## 2020-06-05 MED ORDER — IBUPROFEN 800 MG PO TABS
800.0000 mg | ORAL_TABLET | Freq: Once | ORAL | Status: AC
  Administered 2020-06-05: 800 mg via ORAL
  Filled 2020-06-05: qty 1

## 2020-06-05 NOTE — ED Notes (Signed)
Pt states unable to take tylenol due to bypass. Use ibuprofen in stead per pt.

## 2020-06-05 NOTE — ED Triage Notes (Signed)
Patient presents to the ED via EMS for chest pain x 2 days.  Patient also reports 2 episodes of vomiting today.  Patient had a seizure last night.  Patient has history of seizures, diabetes and asthma.

## 2020-06-28 ENCOUNTER — Other Ambulatory Visit: Payer: Self-pay

## 2020-06-28 ENCOUNTER — Emergency Department
Admission: EM | Admit: 2020-06-28 | Discharge: 2020-06-28 | Disposition: A | Discharge status: Home / Self Care | Payer: Medicaid Other | Attending: Emergency Medicine | Admitting: Emergency Medicine

## 2020-06-28 ENCOUNTER — Emergency Department: Payer: Medicaid Other

## 2020-06-28 DIAGNOSIS — E669 Obesity, unspecified: Secondary | ICD-10-CM | POA: Insufficient documentation

## 2020-06-28 DIAGNOSIS — J45909 Unspecified asthma, uncomplicated: Secondary | ICD-10-CM | POA: Diagnosis not present

## 2020-06-28 DIAGNOSIS — E1169 Type 2 diabetes mellitus with other specified complication: Secondary | ICD-10-CM | POA: Insufficient documentation

## 2020-06-28 DIAGNOSIS — H5310 Unspecified subjective visual disturbances: Secondary | ICD-10-CM | POA: Insufficient documentation

## 2020-06-28 DIAGNOSIS — Y9389 Activity, other specified: Secondary | ICD-10-CM | POA: Diagnosis not present

## 2020-06-28 DIAGNOSIS — Y998 Other external cause status: Secondary | ICD-10-CM | POA: Insufficient documentation

## 2020-06-28 DIAGNOSIS — S060X1A Concussion with loss of consciousness of 30 minutes or less, initial encounter: Secondary | ICD-10-CM | POA: Diagnosis not present

## 2020-06-28 DIAGNOSIS — Y92009 Unspecified place in unspecified non-institutional (private) residence as the place of occurrence of the external cause: Secondary | ICD-10-CM | POA: Diagnosis not present

## 2020-06-28 DIAGNOSIS — F17210 Nicotine dependence, cigarettes, uncomplicated: Secondary | ICD-10-CM | POA: Diagnosis not present

## 2020-06-28 DIAGNOSIS — R110 Nausea: Secondary | ICD-10-CM | POA: Diagnosis not present

## 2020-06-28 DIAGNOSIS — S0990XA Unspecified injury of head, initial encounter: Secondary | ICD-10-CM | POA: Insufficient documentation

## 2020-06-28 DIAGNOSIS — Z7984 Long term (current) use of oral hypoglycemic drugs: Secondary | ICD-10-CM | POA: Insufficient documentation

## 2020-06-28 LAB — POCT PREGNANCY, URINE
Preg Test, Ur: NEGATIVE
Preg Test, Ur: NEGATIVE

## 2020-06-28 MED ORDER — DIPHENHYDRAMINE HCL 50 MG/ML IJ SOLN
25.0000 mg | Freq: Once | INTRAMUSCULAR | Status: AC
  Administered 2020-06-28: 25 mg via INTRAMUSCULAR
  Filled 2020-06-28: qty 1

## 2020-06-28 MED ORDER — PROCHLORPERAZINE EDISYLATE 10 MG/2ML IJ SOLN
10.0000 mg | Freq: Once | INTRAMUSCULAR | Status: AC
  Administered 2020-06-28: 10 mg via INTRAMUSCULAR
  Filled 2020-06-28: qty 2

## 2020-06-28 MED ORDER — ACETAMINOPHEN 325 MG PO TABS: 650.0000 mg | ORAL_TABLET | Freq: Four times a day (QID) | ORAL | 0 refills | Status: DC | PRN

## 2020-06-28 MED ORDER — PROMETHAZINE HCL 25 MG PO TABS: 25.0000 mg | ORAL_TABLET | Freq: Four times a day (QID) | ORAL | 0 refills | Status: DC | PRN
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
## 2020-07-31 ENCOUNTER — Telehealth: Payer: Self-pay

## 2020-07-31 NOTE — Telephone Encounter (Signed)
Pt calling; wants call back.  [phone]  Pt would like rx of clomid or does she need to be seen first?  [facility] [address], [phone]

## 2020-07-31 NOTE — Telephone Encounter (Signed)
Would need to be seen unless discussion with Dr. Jean Rosenthal was earlier this year.  This would be a first available as it is non-emergent

## 2020-07-31 NOTE — Telephone Encounter (Signed)
Does patient needs to be seen for this prescription to be sent in patient last seen 02/08/20. Please advise

## 2020-08-01 NOTE — Telephone Encounter (Signed)
Pt aware.

## 2020-08-07 ENCOUNTER — Emergency Department: Payer: Medicaid Other

## 2020-08-07 ENCOUNTER — Emergency Department
Admission: EM | Admit: 2020-08-07 | Discharge: 2020-08-07 | Disposition: A | Discharge status: Home / Self Care | Payer: Medicaid Other | Attending: Emergency Medicine | Admitting: Emergency Medicine

## 2020-08-07 ENCOUNTER — Other Ambulatory Visit: Payer: Self-pay

## 2020-08-07 DIAGNOSIS — F17210 Nicotine dependence, cigarettes, uncomplicated: Secondary | ICD-10-CM | POA: Diagnosis not present

## 2020-08-07 DIAGNOSIS — Z91040 Latex allergy status: Secondary | ICD-10-CM | POA: Insufficient documentation

## 2020-08-07 DIAGNOSIS — O98819 Other maternal infectious and parasitic diseases complicating pregnancy, unspecified trimester: Secondary | ICD-10-CM

## 2020-08-07 DIAGNOSIS — O98311 Other infections with a predominantly sexual mode of transmission complicating pregnancy, first trimester: Secondary | ICD-10-CM | POA: Diagnosis not present

## 2020-08-07 DIAGNOSIS — Z7984 Long term (current) use of oral hypoglycemic drugs: Secondary | ICD-10-CM | POA: Insufficient documentation

## 2020-08-07 DIAGNOSIS — O23591 Infection of other part of genital tract in pregnancy, first trimester: Secondary | ICD-10-CM | POA: Diagnosis not present

## 2020-08-07 DIAGNOSIS — Z3A00 Weeks of gestation of pregnancy not specified: Secondary | ICD-10-CM | POA: Diagnosis not present

## 2020-08-07 DIAGNOSIS — E119 Type 2 diabetes mellitus without complications: Secondary | ICD-10-CM | POA: Diagnosis not present

## 2020-08-07 DIAGNOSIS — O2391 Unspecified genitourinary tract infection in pregnancy, first trimester: Secondary | ICD-10-CM | POA: Insufficient documentation

## 2020-08-07 DIAGNOSIS — A749 Chlamydial infection, unspecified: Secondary | ICD-10-CM

## 2020-08-07 DIAGNOSIS — B9689 Other specified bacterial agents as the cause of diseases classified elsewhere: Secondary | ICD-10-CM | POA: Diagnosis not present

## 2020-08-07 DIAGNOSIS — N739 Female pelvic inflammatory disease, unspecified: Secondary | ICD-10-CM

## 2020-08-07 DIAGNOSIS — R52 Pain, unspecified: Secondary | ICD-10-CM

## 2020-08-07 DIAGNOSIS — O98211 Gonorrhea complicating pregnancy, first trimester: Secondary | ICD-10-CM | POA: Diagnosis not present

## 2020-08-07 DIAGNOSIS — O2311 Infections of bladder in pregnancy, first trimester: Secondary | ICD-10-CM

## 2020-08-07 DIAGNOSIS — J45909 Unspecified asthma, uncomplicated: Secondary | ICD-10-CM | POA: Diagnosis not present

## 2020-08-07 LAB — URINALYSIS, COMPLETE (UACMP) WITH MICROSCOPIC
Bilirubin Urine: NEGATIVE
Glucose, UA: NEGATIVE mg/dL
Hgb urine dipstick: NEGATIVE
Ketones, ur: NEGATIVE mg/dL
Nitrite: NEGATIVE
Protein, ur: NEGATIVE mg/dL
Specific Gravity, Urine: 1.019 (ref 1.005–1.030)
WBC, UA: >50 WBC/hpf — ABNORMAL HIGH (ref 0–5)
pH: 6 (ref 5.0–8.0)

## 2020-08-07 LAB — CBC WITH DIFFERENTIAL/PLATELET
Abs Immature Granulocytes: 0.1 10*3/uL — ABNORMAL HIGH (ref 0.00–0.07)
Basophils Absolute: 0.1 10*3/uL (ref 0.0–0.1)
Basophils Relative: 1 %
Eosinophils Absolute: 0.3 10*3/uL (ref 0.0–0.5)
Eosinophils Relative: 2 %
HCT: 40.5 % (ref 36.0–46.0)
Hemoglobin: 12.5 g/dL (ref 12.0–15.0)
Immature Granulocytes: 1 %
Lymphocytes Relative: 24 %
Lymphs Abs: 3.1 10*3/uL (ref 0.7–4.0)
MCH: 22.4 pg — ABNORMAL LOW (ref 26.0–34.0)
MCHC: 30.9 g/dL (ref 30.0–36.0)
MCV: 72.7 fL — ABNORMAL LOW (ref 80.0–100.0)
Monocytes Absolute: 0.6 10*3/uL (ref 0.1–1.0)
Monocytes Relative: 5 %
Neutro Abs: 8.8 10*3/uL — ABNORMAL HIGH (ref 1.7–7.7)
Neutrophils Relative %: 67 %
Platelets: 420 10*3/uL — ABNORMAL HIGH (ref 150–400)
RBC: 5.57 MIL/uL — ABNORMAL HIGH (ref 3.87–5.11)
RDW: 18.7 % — ABNORMAL HIGH (ref 11.5–15.5)
WBC: 12.9 10*3/uL — ABNORMAL HIGH (ref 4.0–10.5)
nRBC: 0 % (ref 0.0–0.2)

## 2020-08-07 LAB — COMPREHENSIVE METABOLIC PANEL
ALT: 10 U/L (ref 0–44)
AST: 13 U/L — ABNORMAL LOW (ref 15–41)
Albumin: 4 g/dL (ref 3.5–5.0)
Alkaline Phosphatase: 51 U/L (ref 38–126)
Anion gap: 10 (ref 5–15)
BUN: 10 mg/dL (ref 6–20)
CO2: 27 mmol/L (ref 22–32)
Calcium: 9.4 mg/dL (ref 8.9–10.3)
Chloride: 101 mmol/L (ref 98–111)
Creatinine, Ser: 0.63 mg/dL (ref 0.44–1.00)
GFR, Estimated: >60 mL/min (ref >60)
Glucose, Bld: 89 mg/dL (ref 70–99)
Potassium: 3.6 mmol/L (ref 3.5–5.1)
Sodium: 138 mmol/L (ref 135–145)
Total Bilirubin: 0.4 mg/dL (ref 0.3–1.2)
Total Protein: 8.2 g/dL — ABNORMAL HIGH (ref 6.5–8.1)

## 2020-08-07 LAB — WET PREP, GENITAL
Sperm: NONE SEEN
Trich, Wet Prep: NONE SEEN
Yeast Wet Prep HPF POC: NONE SEEN

## 2020-08-07 LAB — HCG, QUANTITATIVE, PREGNANCY: hCG, Beta Chain, Quant, S: 1737 m[IU]/mL — ABNORMAL HIGH (ref <5)

## 2020-08-07 LAB — CHLAMYDIA/NGC RT PCR (ARMC ONLY)
Chlamydia Tr: DETECTED — AB
N gonorrhoeae: DETECTED — AB

## 2020-08-07 LAB — ABO/RH: ABO/RH(D): O POS

## 2020-08-07 MED ORDER — AZITHROMYCIN 500 MG PO TABS
1000.0000 mg | ORAL_TABLET | Freq: Once | ORAL | Status: AC
  Administered 2020-08-07: 1000 mg via ORAL
  Filled 2020-08-07: qty 2

## 2020-08-07 MED ORDER — CEFTRIAXONE SODIUM 1 G IJ SOLR
500.0000 mg | Freq: Once | INTRAMUSCULAR | Status: AC
  Administered 2020-08-07: 500 mg via INTRAMUSCULAR
  Filled 2020-08-07: qty 10

## 2020-08-07 NOTE — ED Notes (Signed)
Pt here for itching and burning. Pt states possible yeast or STD

## 2020-08-07 NOTE — ED Notes (Signed)
Pt provided warm blanket as requested 

## 2020-08-07 NOTE — ED Triage Notes (Signed)
Pt comes pov [redacted] weeks pregnant. Concerned for std/yeast infection. C/o itching and burning.

## 2020-08-07 NOTE — Discharge Instructions (Addendum)
Please follow-up with Dr. Johnathan Hausen office on Friday for repeat beta hCG lab work and further evaluation.  Return to the emergency department for any acute worsening.

## 2020-08-07 NOTE — ED Notes (Signed)
Pt in ultrasound at this time, unable to obtain VS.

## 2020-08-08 NOTE — ED Provider Notes (Signed)
[facility] Emergency Department Provider Note  ____________________________________________   First MD Initiated Contact with Patient 08/07/20 1741     (approximate)  I have reviewed the triage vital signs and the nursing notes.   HISTORY  Chief Complaint std check   HPI Faith [name] is a 29 y.o. female reports the emergency department with complaint of vaginal itching and burning.  She is concerned and would like testing for STD versus yeast infection.  She also states that she is approximately [redacted] weeks pregnant -states that she found out last month.  She states that she has seen OB but has not yet had an ultrasound regarding this pregnancy.  Prior to current, she is G4, P2.  Patient describes that she has had 1 sexual partner since last time she was STD checked a few months ago.  Is complaining of itchy, vaginal discharge that is white and mucousy in nature and has been present for 4 to 5 days.  She has been using Monistat for the last few days without any improvement.  She denies any increased symptoms with urination.  States that she has lower abdominal pain that has been present the last 2 days.  She also endorses some vaginal spotting over the last 2 days.  Denies significant bleeding, clots. she denies chest pain, shortness of breath, dizziness, fevers.         Past Medical History:  Diagnosis Date  . ADHD   . Anemia    BLOOD TRANSFUSION AFTER C-SECTION ON 06-2016-2 UNITS  . Anxiety   . Asthma    WELL CONTROLLED  . Bipolar 1 disorder, depressed ([place])   . Depression   . Diabetes mellitus without complication ([place])   . Gallstones   . GERD (gastroesophageal reflux disease)   . History of blood transfusion 06/2016   RECEIVED 2 UNITS OF BLOOD AFTER C SECTION  . Migraines   . Ovarian cyst   . Seizures ([place])    LAST SEIZURE 2016  . Tubal pregnancy     Patient Active Problem List   Diagnosis Date Noted  . Hidradenitis suppurativa 05/24/2018    . LLQ pain 05/24/2018  . Encounter for maternal care for low transverse scar from previous cesarean delivery 10/12/2017  . Bipolar disorder ([place]) 10/01/2017  . History of cesarean delivery 07/12/2017  . Family history of Huntington's disease   . Asthma affecting pregnancy, antepartum 05/10/2017  . Smoking (tobacco) complicating pregnancy, unspecified trimester 05/10/2017  . Seizure disorder during pregnancy ([place]) 05/10/2017  . History of anemia 04/19/2017  . History of maternal blood transfusion, currently pregnant 04/19/2017  . Supervision of high risk pregnancy, antepartum 03/02/2017  . Diabetes mellitus during pregnancy, antepartum 01/16/2016  . ADD (attention deficit disorder) 07/17/2015  . Post traumatic stress disorder (PTSD) 06/16/2013  . Depression 06/15/2013  . Suicidal ideation 06/15/2013  . Status epilepticus ([place]) 06/14/2013  . Diabetes mellitus type 2 in obese ([place]) 06/14/2013    Past Surgical History:  Procedure Laterality Date  . CESAREAN SECTION N/A 06/22/2016   Procedure: CESAREAN SECTION;  Surgeon: Malachy Mood, MD;  Location: [facility] ORS;  Service: Obstetrics;  Laterality: N/A;  . CESAREAN SECTION N/A 10/12/2017   Procedure: CESAREAN SECTION;  Surgeon: Malachy Mood, MD;  Location: [facility] ORS;  Service: Obstetrics;  Laterality: N/A;  . CHOLECYSTECTOMY N/A 08/13/2016   Procedure: LAPAROSCOPIC CHOLECYSTECTOMY;  Surgeon: Jules Husbands, MD;  Location: [facility] ORS;  Service: General;  Laterality: N/A;  . DILATION AND CURETTAGE OF UTERUS    .  ovarian cyst removed    . SALPINGECTOMY Right 2013   ectopic pregnancy. PER PATIENT, STILL HAS BOTH TUBES  . TONSILLECTOMY    . TONSILLECTOMY AND ADENOIDECTOMY      Prior to Admission medications   Medication Sig Start Date End Date Taking? Authorizing Provider  ACCU-CHEK FASTCLIX LANCETS MISC 1 Units by Percutaneous route 4 (four) times daily. 05/24/17   Malachy Mood, MD  ACCU-CHEK SMARTVIEW test strip TEST BLOOD SUGAR FOUR  TIMES DAILY AS DIRECTED 08/25/18   Malachy Mood, MD  acetaminophen (TYLENOL) 325 MG tablet Take 2 tablets (650 mg total) by mouth every 6 (six) hours as needed for headache. 06/28/20 06/28/21  Duffy Bruce, MD  albuterol (PROVENTIL HFA;VENTOLIN HFA) 108 (90 BASE) MCG/ACT inhaler Inhale 2 puffs into the lungs every 6 (six) hours as needed. For shortness of breath    [provider]  ALPRAZolam Duanne Moron) 0.5 MG tablet alprazolam 0.5 mg tablet 01/27/18   [provider]  blood glucose meter kit and supplies KIT Dispense based on patient and insurance preference. Use up to four times daily as directed. (FOR ICD-9 250.00, 250.01). 06/18/15   Ahmed Prima, MD  Blood Glucose Monitoring Suppl (ACCU-CHEK NANO SMARTVIEW) w/Device KIT 1 kit by Subdermal route as directed. Check blood sugars for fasting, and two hours after breakfast, lunch and dinner (4 checks daily) 05/24/17   Malachy Mood, MD  cetirizine (ZYRTEC) 10 MG tablet Take 10 mg by mouth daily. 04/11/18   [provider]  fluticasone Asencion Islam) 50 MCG/ACT nasal spray instill 2 sprays into each nostril once daily if needed 04/11/18   [provider]  ibuprofen (ADVIL,MOTRIN) 600 MG tablet Take 1 tablet (600 mg total) by mouth every 6 (six) hours as needed. 10/14/17   Rexene Agent, CNM  metFORMIN (GLUCOPHAGE) 500 MG tablet Take 500 mg by mouth daily. 04/11/18   [provider]  promethazine (PHENERGAN) 25 MG tablet Take 1 tablet (25 mg total) by mouth every 6 (six) hours as needed for nausea or vomiting. 06/28/20   Duffy Bruce, MD    Allergies Ivp dye [iodinated diagnostic agents], Latex, Metrizamide, Morphine, Mushroom extract complex, Sulfasalazine, Tomato, Toradol [ketorolac tromethamine], Aspirin, Dilaudid [hydromorphone hcl], Lamictal [lamotrigine], Sulfa antibiotics, Tramadol, Adhesive [tape], and Iohexol  Family History  Problem Relation Age of Onset  . Heart disease Father   .  Huntington's disease Mother     Social History Social History   Tobacco Use  . Smoking status: Current Every Day Smoker    Packs/day: 0.50    Years: 3.00    Pack years: 1.50    Types: Cigarettes  . Smokeless tobacco: Never Used  Vaping Use  . Vaping Use: Never used  Substance Use Topics  . Alcohol use: No    Alcohol/week: 0.0 standard drinks  . Drug use: No    Review of Systems Constitutional: No fever/chills Eyes: No visual changes. ENT: No sore throat. Cardiovascular: Denies chest pain. Respiratory: Denies shortness of breath. Gastrointestinal: + abdominal pain.  No nausea, no vomiting.  No diarrhea.  No constipation. Genitourinary: + for dysuria, + vaginal discharge,+ itching Musculoskeletal: Negative for back pain. Skin: Negative for rash. Neurological: Negative for headaches, focal weakness or numbness.  ____________________________________________   PHYSICAL EXAM:  VITAL SIGNS: ED Triage Vitals [08/07/20 1658]  Enc Vitals Group     BP 126/86     Pulse Rate 80     Resp 18     Temp 98.7 F (37.1 C)  Temp Source Oral     SpO2 99 %     Weight 180 lb (81.6 kg)     Height 5' (1.524 m)     Head Circumference      Peak Flow      Pain Score 8     Pain Loc      Pain Edu?      Excl. in [place]?     Constitutional: Alert and oriented. Well appearing and in no acute distress. Eyes: Conjunctivae are normal. PERRL. EOMI. Head: Atraumatic. Nose: No congestion/rhinnorhea. Mouth/Throat: Mucous membranes are moist.  Oropharynx non-erythematous. Neck: No stridor.   Cardiovascular: Normal rate, regular rhythm. Grossly normal heart sounds.  Good peripheral circulation. Respiratory: Normal respiratory effort.  No retractions. Lungs CTAB. Gastrointestinal: Soft and tender only in the suprapubic region.  No tenderness in the left lower quadrant, right lower quadrant. No distention. No abdominal bruits. No CVA tenderness. Genitourinary: Deferred.   Musculoskeletal: No  lower extremity tenderness nor edema.  No joint effusions. Neurologic:  Normal speech and language. No gross focal neurologic deficits are appreciated. No gait instability. Skin:  Skin is warm, dry and intact. No rash noted. Psychiatric: Mood and affect are normal. Speech and behavior are normal.  ____________________________________________   LABS (all labs ordered are listed, but only abnormal results are displayed)  Labs Reviewed  WET PREP, GENITAL - Abnormal; Notable for the following components:      Result Value   Clue Cells Wet Prep HPF POC PRESENT (*)    WBC, Wet Prep HPF POC MODERATE (*)    All other components within normal limits  CHLAMYDIA/NGC RT PCR ([facility] ONLY) - Abnormal; Notable for the following components:   Chlamydia Tr DETECTED (*)    N gonorrhoeae DETECTED (*)    All other components within normal limits  URINALYSIS, COMPLETE (UACMP) WITH MICROSCOPIC - Abnormal; Notable for the following components:   Color, Urine YELLOW (*)    APPearance CLOUDY (*)    Leukocytes,Ua LARGE (*)    WBC, UA >50 (*)    Bacteria, UA RARE (*)    All other components within normal limits  HCG, QUANTITATIVE, PREGNANCY - Abnormal; Notable for the following components:   hCG, Beta Chain, Quant, S 1,737 (*)    All other components within normal limits  COMPREHENSIVE METABOLIC PANEL - Abnormal; Notable for the following components:   Total Protein 8.2 (*)    AST 13 (*)    All other components within normal limits  CBC WITH DIFFERENTIAL/PLATELET - Abnormal; Notable for the following components:   WBC 12.9 (*)    RBC 5.57 (*)    MCV 72.7 (*)    MCH 22.4 (*)    RDW 18.7 (*)    Platelets 420 (*)    Neutro Abs 8.8 (*)    Abs Immature Granulocytes 0.10 (*)    All other components within normal limits  URINE CULTURE  ABO/RH    ____________________________________________  RADIOLOGY  Official radiology report(s): US OB LESS THAN 14 WEEKS WITH OB TRANSVAGINAL  Result  Date: 08/07/2020 CLINICAL DATA:  Unsure of last menstrual, likely around 06/19/2020. Gestational age by last menstrual period of 7 weeks and 0 days with estimated due date of 03/26/2021. Spotting for 2 days. Pain for 3-4 days. Beta HCG 1,737. EXAM: OBSTETRIC <14 WK [place] AND TRANSVAGINAL OB US TECHNIQUE: Both transabdominal and transvaginal ultrasound examinations were performed for complete evaluation of the gestation as well as the maternal uterus, adnexal regions, and pelvic  cul-de-sac. Transvaginal technique was performed to assess early pregnancy. COMPARISON:  CT abdomen pelvis 11/25/2016. FINDINGS: Intrauterine gestational sac: The irregular hypoechoic density measured by the ultrasound technologist measuring up 1.4 cm likely represents fluid within the endometrial canal. No definite gestational sac identified. Yolk sac:  Not Visualized. Embryo:  Not Visualized. Subchorionic hemorrhage:  None visualized. Maternal uterus/adnexae: Prior C-section scar identified. Fluid noted within the endometrial canal. Otherwise the uterus is unremarkable. The right ovary measures 2.8 x 2.5 x 3 cm. The left ovary measures 3 x 2.9 x 2.4 cm. Bilateral ovaries are unremarkable. Corpus luteum cyst noted within the left ovary. No adnexal mass visualized. IMPRESSION: Fluid noted within the endometrial canal with no definite intrauterine gestational sac, yolk sac, fetal pole, or cardiac activity yet visualized. No findings to suggest ectopic pregnancy. Findings could represent a probable early pregnancy versus failed pregnancy versus nonvisualized ectopic pregnancy. Recommend follow-up quantitative B-HCG levels and follow-up US in 14 days to assess viability. This recommendation follows SRU consensus guidelines: Diagnostic Criteria for Nonviable Pregnancy Early in the First Trimester. [employer] Med 2013; 811:5726-20. Electronically Signed   By: Iven Finn M.D.   On: 08/07/2020 21:27     ____________________________________________   INITIAL IMPRESSION / ASSESSMENT AND PLAN / ED COURSE  As part of my medical decision making, I reviewed the following data within the [place] notes reviewed and incorporated, Labs reviewed , A consult was requested and obtained from this/these consultant(s) OB and Notes from prior ED visits         Patient is a 29 year old now G5, P2 female who presents emergency department for evaluation of vaginal itching and discharge present for the last 4 to 5 days with concern for STD.  She also presented believing that she was approximately [redacted] weeks pregnant.  See HPI for further details.  Physical exam revealed some suprapubic tenderness but was otherwise unremarkable.  Vitals were within normal limits.  Genital exam was deferred until further work-up was available.  Evaluation in the emergency room began with CBC, CMP, UA, genital wet prep, chlamydia and gonorrhea by PCR, quantitative hCG, ABO type.  Significant findings included white count of approximately 12.9, mild thrombocytosis of 420.  CMP is grossly unremarkable.  UA demonstrates large number of leukocytes and greater than 50 white blood cells with rare bacteria.  Self swab of the genital wet prep is positive for clue cells and white blood cells and chlamydia/gonorrhea by PCR is positive for both GC and chlamydia.  Beta hCG reveals just over 1700.  Given the presence of STD and lower than expected beta hCG as well as the presence of some vaginal spotting and abdominal pain in pregnancy, a ultrasound was ordered.  The yolk sac, fetal pole, etc. were unable to be visualized likely secondary to early pregnancy.  An ectopic was not able to be ruled out.  There was no tubo-ovarian abscess.  These laboratory and imaging findings were suggestive of a pregnancy that is somewhere in the 3 to 4-week range, in the presence of PID with gonorrhea, chlamydia and bacterial vaginosis.   Patient also has findings suggestive of UTI in pregnancy.  Dr. Kenton Kingfisher from OB/GYN was called and consulted on the case.  He advised that the patient could be treated on outpatient basis and did not require admission into the hospital.  He asked that we treat her with Rocephin and azithromycin and hold off on any BV treatment given that she is in the  first trimester of pregnancy.  We also discussed the patient would need to be seen in their office on Friday for repeat beta hCG and for further evaluation.  He recommended that we start with the STD antibiotics for treatment of the suspected UTI, and they will follow this in the office on outpatient basis as well for determination of any further need, we will culture this urine.  This plan was discussed with the patient, and she is amenable with this plan and would like to be discharged home as her ride is here waiting for her.  She is stable at this time for outpatient therapy, and will follow up with Dr. Doreene Adas office on Friday.     ____________________________________________   FINAL CLINICAL IMPRESSION(S) / ED DIAGNOSES  Final diagnoses:  Chlamydia infection during pregnancy  Gonorrhea affecting pregnancy in first trimester  Bacterial vaginosis in pregnancy  Pelvic inflammatory disease, female  Acute cystitis during pregnancy in first trimester     ED Discharge Orders    None      *Please note:  Faith [name] was evaluated in Emergency Department on 08/08/2020 for the symptoms described in the history of present illness. She was evaluated in the context of the global COVID-19 pandemic, which necessitated consideration that the patient might be at risk for infection with the SARS-CoV-2 virus that causes COVID-19. Institutional protocols and algorithms that pertain to the evaluation of patients at risk for COVID-19 are in a state of rapid change based on information released by regulatory bodies including the CDC and federal and state  organizations. These policies and algorithms were followed during the patient's care in the ED.  Some ED evaluations and interventions may be delayed as a result of limited staffing during and the pandemic.*   Note:  This document was prepared using Dragon voice recognition software and may include unintentional dictation errors.    Marlana Salvage, PA 08/08/20 1652    Nance Pear, MD 08/08/20 Joen Laura

## 2020-08-09 ENCOUNTER — Ambulatory Visit (INDEPENDENT_AMBULATORY_CARE_PROVIDER_SITE_OTHER): Payer: Medicaid Other | Admitting: Obstetrics & Gynecology

## 2020-08-09 ENCOUNTER — Encounter: Payer: Self-pay | Admitting: Obstetrics & Gynecology

## 2020-08-09 ENCOUNTER — Other Ambulatory Visit: Payer: Self-pay

## 2020-08-09 VITALS — BP 120/80 | Ht 60.0 in | Wt 194.0 lb

## 2020-08-09 DIAGNOSIS — A749 Chlamydial infection, unspecified: Secondary | ICD-10-CM | POA: Diagnosis not present

## 2020-08-09 DIAGNOSIS — O3680X0 Pregnancy with inconclusive fetal viability, not applicable or unspecified: Secondary | ICD-10-CM

## 2020-08-09 LAB — URINE CULTURE: Culture: <10000 — AB

## 2020-08-09 MED ORDER — AZITHROMYCIN 500 MG PO TABS: 1000.0000 mg | ORAL_TABLET | Freq: Once | ORAL | 0 refills | Status: AC

## 2020-08-09 NOTE — Progress Notes (Signed)
  History of Present Illness:  Faith [name] is a 29 y.o. who was seen in ER for vaginal itching 2 days ago, found to have Chlamydia.  She was given IM Rocephin, unsure about other treatment.  She was also found to have early pregnancy w Beta 1700 and [place] w gest sac but no fetal pole as of yet.  She desires this pregnancy.  Infertility in past, irreg cycles.  LMP August, not unusual for her to space out periods.  Prior complicated pregnancies w obesity, diabetes, cesarean, asthma, tobacco use, seizure disorder.  PMHx: She  has a past medical history of ADHD, Anemia, Anxiety, Asthma, Bipolar 1 disorder, depressed (H[facility]), Depression, Diabetes mellitus without complication (H[facility]), Gallstones, GERD (gastroesophageal reflux disease), History of blood transfusion (06/2016), Migraines, Ovarian cyst, Seizures ([facility]), and Tubal pregnancy. Also,  has a past surgical history that includes Dilation and curettage of uterus; ovarian cyst removed; Tonsillectomy; Tonsillectomy and adenoidectomy; Salpingectomy (Right, 2013); Cholecystectomy (N/A, 08/13/2016); Cesarean section (N/A, 06/22/2016); and Cesarean section (N/A, 10/12/2017)., family history includes Heart disease in her father; Huntington's disease in her mother.,  reports that she has been smoking cigarettes. She has a 1.50 pack-year smoking history. She has never used smokeless tobacco. She reports that she does not drink alcohol and does not use drugs.  MEDS: None  Also, is allergic to ivp dye [iodinated diagnostic agents], latex, metrizamide, morphine, mushroom extract complex, sulfasalazine, tomato, toradol [ketorolac tromethamine], aspirin, dilaudid [hydromorphone hcl], lamictal [lamotrigine], sulfa antibiotics, tramadol, adhesive [tape], and iohexol..  Review of Systems  Constitutional: Negative for chills, fever and malaise/fatigue.  HENT: Negative for congestion, sinus pain and sore throat.   Eyes: Negative for blurred vision and pain.  Respiratory:  Negative for cough and wheezing.   Cardiovascular: Negative for chest pain and leg swelling.  Gastrointestinal: Negative for abdominal pain, constipation, diarrhea, heartburn, nausea and vomiting.  Genitourinary: Negative for dysuria, frequency, hematuria and urgency.  Musculoskeletal: Negative for back pain, joint pain, myalgias and neck pain.  Skin: Negative for itching and rash.  Neurological: Negative for dizziness, tremors and weakness.  Endo/Heme/Allergies: Does not bruise/bleed easily.  Psychiatric/Behavioral: Negative for depression. The patient is not nervous/anxious and does not have insomnia.   All other systems reviewed and are negative.   Physical Exam:  BP 120/80   Ht 5' (1.524 m)   Wt 194 lb (88 kg)   BMI 37.89 kg/m  Body mass index is 37.89 kg/m. Constitutional: Well nourished, well developed female in no acute distress.  Abdomen: diffusely non tender to palpation, non distended, and no masses, hernias Neuro: Grossly intact Psych:  Normal mood and affect.    Assessment:  Problem List Items Addressed This Visit   Visit Diagnoses    Pregnancy with uncertain fetal viability, single or unspecified fetus    -  Primary   Relevant Orders   Beta hCG quant (ref lab)   Chlamydia infection       Relevant Medications   azithromycin (ZITHROMAX) 500 MG tablet     Plan: Beta today and [place] based on results NOB if rising appropriately Azithromycin to ensure complete tx for Chlamydia Assess at/after NOB for early glucola due to risks of obesity and prior GDM  A total of 20 minutes were spent face-to-face with the patient as well as preparation, review, communication, and documentation during this encounter.   Annamarie Major, MD, [facility], [facility] 08/09/2020  10:58 AM

## 2020-08-09 NOTE — Patient Instructions (Signed)
First Trimester of Pregnancy  The first trimester of pregnancy is from week 1 until the end of week 13 (months 1 through 3). During this time, your baby will begin to develop inside you. At 6-8 weeks, the eyes and face are formed, and the heartbeat can be seen on ultrasound. At the end of 12 weeks, all the baby's organs are formed. Prenatal care is all the medical care you receive before the birth of your baby. Make sure you get good prenatal care and follow all of your doctor's instructions. Follow these instructions at home: Medicines  Take over-the-counter and prescription medicines only as told by your doctor. Some medicines are safe and some medicines are not safe during pregnancy.  Take a prenatal vitamin that contains at least 600 micrograms (mcg) of folic acid.  If you have trouble pooping (constipation), take medicine that will make your stool soft (stool softener) if your doctor approves. Eating and drinking   Eat regular, healthy meals.  Your doctor will tell you the amount of weight gain that is right for you.  Avoid raw meat and uncooked cheese.  If you feel sick to your stomach (nauseous) or throw up (vomit): ? Eat 4 or 5 small meals a day instead of 3 large meals. ? Try eating a few soda crackers. ? Drink liquids between meals instead of during meals.  To prevent constipation: ? Eat foods that are high in fiber, like fresh fruits and vegetables, whole grains, and beans. ? Drink enough fluids to keep your pee (urine) clear or pale yellow. Activity  Exercise only as told by your doctor. Stop exercising if you have cramps or pain in your lower belly (abdomen) or low back.  Do not exercise if it is too hot, too humid, or if you are in a place of great height (high altitude).  Try to avoid standing for long periods of time. Move your legs often if you must stand in one place for a long time.  Avoid heavy lifting.  Wear low-heeled shoes. Sit and stand up  straight.  You can have sex unless your doctor tells you not to. Relieving pain and discomfort  Wear a good support bra if your breasts are sore.  Take warm water baths (sitz baths) to soothe pain or discomfort caused by hemorrhoids. Use hemorrhoid cream if your doctor says it is okay.  Rest with your legs raised if you have leg cramps or low back pain.  If you have puffy, bulging veins (varicose veins) in your legs: ? Wear support hose or compression stockings as told by your doctor. ? Raise (elevate) your feet for 15 minutes, 3-4 times a day. ? Limit salt in your food. Prenatal care  Schedule your prenatal visits by the twelfth week of pregnancy.  Write down your questions. Take them to your prenatal visits.  Keep all your prenatal visits as told by your doctor. This is important. Safety  Wear your seat belt at all times when driving.  Make a list of emergency phone numbers. The list should include numbers for family, friends, the hospital, and police and fire departments. General instructions  Ask your doctor for a referral to a local prenatal class. Begin classes no later than at the start of month 6 of your pregnancy.  Ask for help if you need counseling or if you need help with nutrition. Your doctor can give you advice or tell you where to go for help.  Do not use hot tubs, steam   rooms, or saunas.  Do not douche or use tampons or scented sanitary pads.  Do not cross your legs for long periods of time.  Avoid all herbs and alcohol. Avoid drugs that are not approved by your doctor.  Do not use any tobacco products, including cigarettes, chewing tobacco, and electronic cigarettes. If you need help quitting, ask your doctor. You may get counseling or other support to help you quit.  Avoid cat litter boxes and soil used by cats. These carry germs that can cause birth defects in the baby and can cause a loss of your baby (miscarriage) or stillbirth.  Visit your dentist.  At home, brush your teeth with a soft toothbrush. Be gentle when you floss. Contact a doctor if:  You are dizzy.  You have mild cramps or pressure in your lower belly.  You have a nagging pain in your belly area.  You continue to feel sick to your stomach, you throw up, or you have watery poop (diarrhea).  You have a bad smelling fluid coming from your vagina.  You have pain when you pee (urinate).  You have increased puffiness (swelling) in your face, hands, legs, or ankles. Get help right away if:  You have a fever.  You are leaking fluid from your vagina.  You have spotting or bleeding from your vagina.  You have very bad belly cramping or pain.  You gain or lose weight rapidly.  You throw up blood. It may look like coffee grounds.  You are around people who have German measles, fifth disease, or chickenpox.  You have a very bad headache.  You have shortness of breath.  You have any kind of trauma, such as from a fall or a car accident. Summary  The first trimester of pregnancy is from week 1 until the end of week 13 (months 1 through 3).  To take care of yourself and your unborn baby, you will need to eat healthy meals, take medicines only if your doctor tells you to do so, and do activities that are safe for you and your baby.  Keep all follow-up visits as told by your doctor. This is important as your doctor will have to ensure that your baby is healthy and growing well. This information is not intended to replace advice given to you by your health care provider. Make sure you discuss any questions you have with your health care provider. Document Revised: 01/12/2019 Document Reviewed: 09/29/2016 Elsevier Patient Education  2020 Elsevier Inc.  

## 2020-08-10 LAB — BETA HCG QUANT (REF LAB): hCG Quant: 1847 m[IU]/mL

## 2020-08-10 NOTE — Progress Notes (Signed)
Pt to have lab appt Mon please (am) for beta hCG level;  order placed.  Discussed w pt results of recent lab work and [place], and likely miscarriage related to slow rise in beta hCG levels.

## 2020-08-12 ENCOUNTER — Telehealth: Payer: Self-pay | Admitting: Obstetrics & Gynecology

## 2020-08-12 ENCOUNTER — Other Ambulatory Visit: Payer: Self-pay

## 2020-08-12 ENCOUNTER — Other Ambulatory Visit: Payer: Self-pay | Admitting: Obstetrics and Gynecology

## 2020-08-12 ENCOUNTER — Other Ambulatory Visit: Payer: Medicaid Other

## 2020-08-12 DIAGNOSIS — O3680X0 Pregnancy with inconclusive fetal viability, not applicable or unspecified: Secondary | ICD-10-CM

## 2020-08-12 NOTE — Progress Notes (Signed)
g

## 2020-08-12 NOTE — Telephone Encounter (Signed)
Patient is scheduled for lab today at 2:20

## 2020-08-12 NOTE — Telephone Encounter (Signed)
Patient reports she went to [facility] yesterday and had more labs drawn HCG 2649 and had U/S showing she is [redacted]w[redacted]d. RPH had advised her to have labs and u/s this week. Patient would like to discuss what she needs to do now and her results.

## 2020-08-12 NOTE — Telephone Encounter (Signed)
-----   Message from Nadara Mustard, MD sent at 08/10/2020 10:46 AM EDT ----- Pt to have lab appt Mon please (am) for beta hCG level;  order placed.  Discussed w pt results of recent lab work and [place], and likely miscarriage related to slow rise in beta hCG levels.

## 2020-08-13 ENCOUNTER — Other Ambulatory Visit: Payer: Self-pay | Admitting: Obstetrics & Gynecology

## 2020-08-13 DIAGNOSIS — O3680X0 Pregnancy with inconclusive fetal viability, not applicable or unspecified: Secondary | ICD-10-CM

## 2020-08-13 LAB — PROGESTERONE: Progesterone: 6.4 ng/mL

## 2020-08-13 LAB — BETA HCG QUANT (REF LAB): hCG Quant: 3865 m[IU]/mL

## 2020-08-13 NOTE — Telephone Encounter (Signed)
Sch early OB US Thurs or Fri, w f/u  D/w pt results of recent lab work w pt.  SLow rise in Beat hCG.  [place] Saturday showed gest sac and normal adnexa.  Still have to have concerns regarding ectopic.  Cont to follow/.

## 2020-08-13 NOTE — Telephone Encounter (Signed)
Patient is scheduled for Thursday, 08/15/20 RPH to call with results

## 2020-08-14 ENCOUNTER — Telehealth: Payer: Self-pay

## 2020-08-14 ENCOUNTER — Other Ambulatory Visit: Payer: Self-pay | Admitting: Obstetrics & Gynecology

## 2020-08-14 NOTE — Telephone Encounter (Signed)
Let her know it is ok to keep [place] appt tomorrow, but if pain intensifies then ER to more quickly assess and do [place] today/tonight  Pt aware.

## 2020-08-14 NOTE — Telephone Encounter (Signed)
Pt called after hour nurse 08/13/20 7:30pm c/o just found out she is 7wks preg and is concerned b/c her abd is burning on her side; no other sxs.  The after hour nurse adv she be seen within 24hrs.  [phone]

## 2020-08-15 ENCOUNTER — Ambulatory Visit
Admission: RE | Admit: 2020-08-15 | Discharge: 2020-08-15 | Disposition: A | Discharge status: Home / Self Care | Payer: Medicaid Other | Source: Ambulatory Visit | Attending: Obstetrics & Gynecology | Admitting: Obstetrics & Gynecology

## 2020-08-15 ENCOUNTER — Ambulatory Visit: Payer: Medicaid Other

## 2020-08-15 ENCOUNTER — Other Ambulatory Visit: Payer: Self-pay

## 2020-08-15 DIAGNOSIS — O3680X0 Pregnancy with inconclusive fetal viability, not applicable or unspecified: Secondary | ICD-10-CM | POA: Diagnosis present

## 2020-08-28 ENCOUNTER — Ambulatory Visit (INDEPENDENT_AMBULATORY_CARE_PROVIDER_SITE_OTHER): Payer: Medicaid Other

## 2020-08-28 ENCOUNTER — Other Ambulatory Visit: Payer: Self-pay

## 2020-08-28 ENCOUNTER — Other Ambulatory Visit (HOSPITAL_COMMUNITY)
Admission: RE | Admit: 2020-08-28 | Discharge: 2020-08-28 | Disposition: A | Discharge status: Home / Self Care | Payer: Medicaid Other | Source: Ambulatory Visit | Attending: Obstetrics | Admitting: Obstetrics

## 2020-08-28 ENCOUNTER — Ambulatory Visit (INDEPENDENT_AMBULATORY_CARE_PROVIDER_SITE_OTHER): Payer: Medicaid Other | Admitting: Obstetrics

## 2020-08-28 ENCOUNTER — Other Ambulatory Visit: Payer: Self-pay | Admitting: Obstetrics and Gynecology

## 2020-08-28 ENCOUNTER — Encounter: Payer: Self-pay | Admitting: Obstetrics

## 2020-08-28 ENCOUNTER — Other Ambulatory Visit: Payer: Medicaid Other

## 2020-08-28 VITALS — BP 100/68 | Ht 60.0 in | Wt 199.2 lb

## 2020-08-28 DIAGNOSIS — Z0189 Encounter for other specified special examinations: Secondary | ICD-10-CM

## 2020-08-28 DIAGNOSIS — Z3A01 Less than 8 weeks gestation of pregnancy: Secondary | ICD-10-CM

## 2020-08-28 DIAGNOSIS — O0990 Supervision of high risk pregnancy, unspecified, unspecified trimester: Secondary | ICD-10-CM | POA: Insufficient documentation

## 2020-08-28 DIAGNOSIS — Z3A24 24 weeks gestation of pregnancy: Secondary | ICD-10-CM

## 2020-08-28 DIAGNOSIS — O24111 Pre-existing diabetes mellitus, type 2, in pregnancy, first trimester: Secondary | ICD-10-CM

## 2020-08-28 LAB — OB RESULTS CONSOLE GC/CHLAMYDIA: Gonorrhea: NEGATIVE

## 2020-08-28 NOTE — Progress Notes (Signed)
NOB 24 W
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
## 2020-09-03 LAB — CYTOLOGY - PAP
Chlamydia: NEGATIVE
Comment: NEGATIVE
Comment: NEGATIVE
Comment: NORMAL
Diagnosis: NEGATIVE
Neisseria Gonorrhea: NEGATIVE
Trichomonas: NEGATIVE

## 2020-09-04 ENCOUNTER — Encounter: Payer: Self-pay | Admitting: Obstetrics

## 2020-09-04 DIAGNOSIS — O0990 Supervision of high risk pregnancy, unspecified, unspecified trimester: Secondary | ICD-10-CM | POA: Insufficient documentation

## 2020-09-11 ENCOUNTER — Telehealth: Payer: Self-pay

## 2020-09-11 NOTE — Telephone Encounter (Signed)
Spoke w/patient. She is requesting a work note stating that she is pregnant and high risk and can only work 4-5 hours per day. She works 8 hours and has to stand on her feet all day. Advised I can provide letter stating she is pregnant and High Risk (she has DM 2) and list general pregnancy restrictions of not lifting over 20lbs, needs breaks for meals, bathroom and to be able to drink water/fluids during her shift. Patient aware if complications arise during her pregnancy and any additional limitations are advised, she will be provided a note at that time stating those restrictions. Letter created and sent via my chart.

## 2020-09-11 NOTE — Telephone Encounter (Signed)
Patient requesting work note. Cb#[phone]

## 2020-09-19 ENCOUNTER — Other Ambulatory Visit: Payer: Self-pay

## 2020-09-19 ENCOUNTER — Ambulatory Visit (INDEPENDENT_AMBULATORY_CARE_PROVIDER_SITE_OTHER): Payer: Medicaid Other | Admitting: Obstetrics and Gynecology

## 2020-09-19 VITALS — BP 118/74 | Wt 202.0 lb

## 2020-09-19 DIAGNOSIS — O99330 Smoking (tobacco) complicating pregnancy, unspecified trimester: Secondary | ICD-10-CM

## 2020-09-19 DIAGNOSIS — O24919 Unspecified diabetes mellitus in pregnancy, unspecified trimester: Secondary | ICD-10-CM

## 2020-09-19 DIAGNOSIS — O0991 Supervision of high risk pregnancy, unspecified, first trimester: Secondary | ICD-10-CM

## 2020-09-19 DIAGNOSIS — O99351 Diseases of the nervous system complicating pregnancy, first trimester: Secondary | ICD-10-CM

## 2020-09-19 DIAGNOSIS — J45909 Unspecified asthma, uncomplicated: Secondary | ICD-10-CM

## 2020-09-19 DIAGNOSIS — Z1379 Encounter for other screening for genetic and chromosomal anomalies: Secondary | ICD-10-CM

## 2020-09-19 DIAGNOSIS — O34219 Maternal care for unspecified type scar from previous cesarean delivery: Secondary | ICD-10-CM

## 2020-09-19 DIAGNOSIS — G40909 Epilepsy, unspecified, not intractable, without status epilepticus: Secondary | ICD-10-CM

## 2020-09-19 DIAGNOSIS — Z3A10 10 weeks gestation of pregnancy: Secondary | ICD-10-CM

## 2020-09-19 DIAGNOSIS — O99519 Diseases of the respiratory system complicating pregnancy, unspecified trimester: Secondary | ICD-10-CM

## 2020-09-19 HISTORY — DX: Unspecified diabetes mellitus in pregnancy, unspecified trimester: O24.919

## 2020-09-19 MED ORDER — ACCU-CHEK SOFTCLIX LANCETS MISC: 1.0000 | Freq: Four times a day (QID) | 12 refills | Status: DC

## 2020-09-19 MED ORDER — ACCU-CHEK NANO SMARTVIEW W/DEVICE KIT: 1.0000 | PACK | 0 refills | Status: DC

## 2020-09-19 MED ORDER — ACCU-CHEK SMARTVIEW VI STRP: ORAL_STRIP | 12 refills | Status: DC

## 2020-09-19 NOTE — Progress Notes (Signed)
  Routine Prenatal Care Visit  Subjective  Faith [name] is a 29 y.o. [id number] at [redacted]w[redacted]d being seen today for ongoing prenatal care.  She is currently monitored for the following issues for this high-risk pregnancy and has Status epilepticus (HCC); Diabetes mellitus type 2 in obese (HCC); Depression; Suicidal ideation; Post traumatic stress disorder (PTSD); ADD (attention deficit disorder); History of anemia; History of maternal blood transfusion, currently pregnant; Asthma affecting pregnancy, antepartum; Smoking (tobacco) complicating pregnancy, unspecified trimester; Seizure disorder during pregnancy (HCC); Family history of Huntington's disease; History of cesarean delivery; Bipolar disorder (HCC); History of cesarean delivery, currently pregnant; Hidradenitis suppurativa; LLQ pain; Supervision of high risk pregnancy, antepartum; and Diabetes mellitus affecting pregnancy, unspecified trimester on their problem list.  ----------------------------------------------------------------------------------- Patient reports no complaints.   Contractions: Not present. Vag. Bleeding: None.   . Leaking Fluid denies.  ----------------------------------------------------------------------------------- The following portions of the patient's history were reviewed and updated as appropriate: allergies, current medications, past family history, past medical history, past social history, past surgical history and problem list. Problem list updated.  Objective  Blood pressure 118/74, weight 202 lb (91.6 kg). Pregravid weight 199 lb (90.3 kg) Total Weight Gain 3 lb (1.361 kg) Urinalysis: Urine Protein    Urine Glucose    Fetal Status: Fetal Heart Rate (bpm): 169         General:  Alert, oriented and cooperative. Patient is in no acute distress.  Skin: Skin is warm and dry. No rash noted.   Cardiovascular: Normal heart rate noted  Respiratory: Normal respiratory effort, no problems with respiration noted   Abdomen: Soft, gravid, appropriate for gestational age. Pain/Pressure: Absent     Pelvic:  Cervical exam deferred        Extremities: Normal range of motion.     Mental Status: Normal mood and affect. Normal behavior. Normal judgment and thought content.   Assessment   29 y.o. [id number] at [redacted]w[redacted]d by  04/11/2021, by Ultrasound presenting for routine prenatal visit  Plan   PREGNANCY 7 Problems (from 08/28/20 to present)    Problem Noted Resolved   Diabetes mellitus affecting pregnancy, unspecified trimester 09/19/2020 by Conard Novak, MD No   Supervision of high risk pregnancy, antepartum 09/04/2020 by Mirna Mires, CNM No   History of cesarean delivery, currently pregnant 10/12/2017 by Vena Austria, MD No   Overview Signed 09/19/2020  2:12 PM by Conard Novak, MD    Arrest of dilation at 7cm after 3 day IOL Baby 8#7oz Blood transfusion postpartum      Asthma affecting pregnancy, antepartum 05/10/2017 by Lady Deutscher, MD No   Seizure disorder during pregnancy (HCC) 05/10/2017 by Lady Deutscher, MD No       Preterm labor symptoms and general obstetric precautions including but not limited to vaginal bleeding, contractions, leaking of fluid and fetal movement were reviewed in detail with the patient. Please refer to After Visit Summary for other counseling recommendations.   - Glucometer ordered w lancets/strips - check fasting and 2h pp each day, log provided - labs still needed. No lab tech today. Will draw asap.  Return in about 3 weeks (around 10/10/2020) for Routine Prenatal Appointment (lab appt ASAP).   Thomasene Mohair, MD, [facility], [facility] 09/19/2020 2:37 PM

## 2020-09-20 ENCOUNTER — Other Ambulatory Visit: Payer: Medicaid Other

## 2020-09-20 DIAGNOSIS — O24111 Pre-existing diabetes mellitus, type 2, in pregnancy, first trimester: Secondary | ICD-10-CM

## 2020-09-20 DIAGNOSIS — O0990 Supervision of high risk pregnancy, unspecified, unspecified trimester: Secondary | ICD-10-CM

## 2020-09-20 DIAGNOSIS — Z1379 Encounter for other screening for genetic and chromosomal anomalies: Secondary | ICD-10-CM

## 2020-09-20 DIAGNOSIS — Z3A10 10 weeks gestation of pregnancy: Secondary | ICD-10-CM

## 2020-09-20 DIAGNOSIS — O0991 Supervision of high risk pregnancy, unspecified, first trimester: Secondary | ICD-10-CM

## 2020-09-20 LAB — OB RESULTS CONSOLE VARICELLA ZOSTER ANTIBODY, IGG: Varicella: IMMUNE

## 2020-09-21 LAB — RPR+RH+ABO+RUB AB+AB SCR+CB...
Antibody Screen: NEGATIVE
HIV Screen 4th Generation wRfx: NONREACTIVE
Hematocrit: 37.2 % (ref 34.0–46.6)
Hemoglobin: 12 g/dL (ref 11.1–15.9)
Hepatitis B Surface Ag: NEGATIVE
MCH: 23.8 pg — ABNORMAL LOW (ref 26.6–33.0)
MCHC: 32.3 g/dL (ref 31.5–35.7)
MCV: 74 fL — ABNORMAL LOW (ref 79–97)
Platelets: 302 10*3/uL (ref 150–450)
RBC: 5.04 x10E6/uL (ref 3.77–5.28)
RDW: 17.2 % — ABNORMAL HIGH (ref 11.7–15.4)
RPR Ser Ql: NONREACTIVE
Rh Factor: POSITIVE
Rubella Antibodies, IGG: 1.76 index (ref >0.99)
Varicella zoster IgG: 2593 index (ref >165)
WBC: 12.3 10*3/uL — ABNORMAL HIGH (ref 3.4–10.8)

## 2020-09-21 LAB — HEMOGLOBIN A1C
Est. average glucose Bld gHb Est-mCnc: 100 mg/dL
Hgb A1c MFr Bld: 5.1 % (ref 4.8–5.6)

## 2020-09-29 LAB — MATERNIT 21 PLUS CORE, BLOOD
Fetal Fraction: 7
Result (T21): NEGATIVE
Trisomy 13 (Patau syndrome): NEGATIVE
Trisomy 18 (Edwards syndrome): NEGATIVE
Trisomy 21 (Down syndrome): NEGATIVE

## 2020-10-10 ENCOUNTER — Encounter: Payer: Self-pay | Admitting: Obstetrics & Gynecology

## 2020-10-10 ENCOUNTER — Ambulatory Visit (INDEPENDENT_AMBULATORY_CARE_PROVIDER_SITE_OTHER): Payer: Medicaid Other | Admitting: Obstetrics & Gynecology

## 2020-10-10 ENCOUNTER — Other Ambulatory Visit: Payer: Self-pay

## 2020-10-10 VITALS — BP 122/70 | Wt 205.0 lb

## 2020-10-10 DIAGNOSIS — O0990 Supervision of high risk pregnancy, unspecified, unspecified trimester: Secondary | ICD-10-CM

## 2020-10-10 DIAGNOSIS — O0992 Supervision of high risk pregnancy, unspecified, second trimester: Secondary | ICD-10-CM

## 2020-10-10 DIAGNOSIS — G40909 Epilepsy, unspecified, not intractable, without status epilepticus: Secondary | ICD-10-CM

## 2020-10-10 DIAGNOSIS — Z3689 Encounter for other specified antenatal screening: Secondary | ICD-10-CM

## 2020-10-10 DIAGNOSIS — Z3A13 13 weeks gestation of pregnancy: Secondary | ICD-10-CM

## 2020-10-10 DIAGNOSIS — O99352 Diseases of the nervous system complicating pregnancy, second trimester: Secondary | ICD-10-CM

## 2020-10-10 DIAGNOSIS — O34219 Maternal care for unspecified type scar from previous cesarean delivery: Secondary | ICD-10-CM

## 2020-10-10 DIAGNOSIS — O24112 Pre-existing diabetes mellitus, type 2, in pregnancy, second trimester: Secondary | ICD-10-CM

## 2020-10-10 NOTE — Patient Instructions (Signed)
Thank you for choosing [facility]. As part of our ongoing efforts to improve patient experience, we would appreciate your feedback. Please fill out the short survey that you will receive by mail or MyChart. Your opinion is important to us! -Dr Kele Barthelemy  Second Trimester of Pregnancy The second trimester is from week 14 through week 27 (months 4 through 6). The second trimester is often a time when you feel your best. Your body has adjusted to being pregnant, and you begin to feel better physically. Usually, morning sickness has lessened or quit completely, you may have more energy, and you may have an increase in appetite. The second trimester is also a time when the fetus is growing rapidly. At the end of the sixth month, the fetus is about 9 inches long and weighs about 1 pounds. You will likely begin to feel the baby move (quickening) between 16 and 20 weeks of pregnancy. Body changes during your second trimester Your body continues to go through many changes during your second trimester. The changes vary from woman to woman.  Your weight will continue to increase. You will notice your lower abdomen bulging out.  You may begin to get stretch marks on your hips, abdomen, and breasts.  You may develop headaches that can be relieved by medicines. The medicines should be approved by your health care provider.  You may urinate more often because the fetus is pressing on your bladder.  You may develop or continue to have heartburn as a result of your pregnancy.  You may develop constipation because certain hormones are causing the muscles that push waste through your intestines to slow down.  You may develop hemorrhoids or swollen, bulging veins (varicose veins).  You may have back pain. This is caused by: ? Weight gain. ? Pregnancy hormones that are relaxing the joints in your pelvis. ? A shift in weight and the muscles that support your balance.  Your breasts will continue to grow and  they will continue to become tender.  Your gums may bleed and may be sensitive to brushing and flossing.  Dark spots or blotches (chloasma, mask of pregnancy) may develop on your face. This will likely fade after the baby is born.  A dark line from your belly button to the pubic area (linea nigra) may appear. This will likely fade after the baby is born.  You may have changes in your hair. These can include thickening of your hair, rapid growth, and changes in texture. Some women also have hair loss during or after pregnancy, or hair that feels dry or thin. Your hair will most likely return to normal after your baby is born. What to expect at prenatal visits During a routine prenatal visit:  You will be weighed to make sure you and the fetus are growing normally.  Your blood pressure will be taken.  Your abdomen will be measured to track your baby's growth.  The fetal heartbeat will be listened to.  Any test results from the previous visit will be discussed. Your health care provider may ask you:  How you are feeling.  If you are feeling the baby move.  If you have had any abnormal symptoms, such as leaking fluid, bleeding, severe headaches, or abdominal cramping.  If you are using any tobacco products, including cigarettes, chewing tobacco, and electronic cigarettes.  If you have any questions. Other tests that may be performed during your second trimester include:  Blood tests that check for: ? Low iron levels (  anemia). ? High blood sugar that affects pregnant women (gestational diabetes) between 24 and 28 weeks. ? Rh antibodies. This is to check for a protein on red blood cells (Rh factor).  Urine tests to check for infections, diabetes, or protein in the urine.  An ultrasound to confirm the proper growth and development of the baby.  An amniocentesis to check for possible genetic problems.  Fetal screens for spina bifida and Down syndrome.  HIV (human  immunodeficiency virus) testing. Routine prenatal testing includes screening for HIV, unless you choose not to have this test. Follow these instructions at home: Medicines  Follow your health care provider's instructions regarding medicine use. Specific medicines may be either safe or unsafe to take during pregnancy.  Take a prenatal vitamin that contains at least 600 micrograms (mcg) of folic acid.  If you develop constipation, try taking a stool softener if your health care provider approves. Eating and drinking   Eat a balanced diet that includes fresh fruits and vegetables, whole grains, good sources of protein such as meat, eggs, or tofu, and low-fat dairy. Your health care provider will help you determine the amount of weight gain that is right for you.  Avoid raw meat and uncooked cheese. These carry germs that can cause birth defects in the baby.  If you have low calcium intake from food, talk to your health care provider about whether you should take a daily calcium supplement.  Limit foods that are high in fat and processed sugars, such as fried and sweet foods.  To prevent constipation: ? Drink enough fluid to keep your urine clear or pale yellow. ? Eat foods that are high in fiber, such as fresh fruits and vegetables, whole grains, and beans. Activity  Exercise only as directed by your health care provider. Most women can continue their usual exercise routine during pregnancy. Try to exercise for 30 minutes at least 5 days a week. Stop exercising if you experience uterine contractions.  Avoid heavy lifting, wear low heel shoes, and practice good posture.  A sexual relationship may be continued unless your health care provider directs you otherwise. Relieving pain and discomfort  Wear a good support bra to prevent discomfort from breast tenderness.  Take warm sitz baths to soothe any pain or discomfort caused by hemorrhoids. Use hemorrhoid cream if your health care  provider approves.  Rest with your legs elevated if you have leg cramps or low back pain.  If you develop varicose veins, wear support hose. Elevate your feet for 15 minutes, 3-4 times a day. Limit salt in your diet. Prenatal Care  Write down your questions. Take them to your prenatal visits.  Keep all your prenatal visits as told by your health care provider. This is important. Safety  Wear your seat belt at all times when driving.  Make a list of emergency phone numbers, including numbers for family, friends, the hospital, and police and fire departments. General instructions  Ask your health care provider for a referral to a local prenatal education class. Begin classes no later than the beginning of month 6 of your pregnancy.  Ask for help if you have counseling or nutritional needs during pregnancy. Your health care provider can offer advice or refer you to specialists for help with various needs.  Do not use hot tubs, steam rooms, or saunas.  Do not douche or use tampons or scented sanitary pads.  Do not cross your legs for long periods of time.  Avoid cat litter   boxes and soil used by cats. These carry germs that can cause birth defects in the baby and possibly loss of the fetus by miscarriage or stillbirth.  Avoid all smoking, herbs, alcohol, and unprescribed drugs. Chemicals in these products can affect the formation and growth of the baby.  Do not use any products that contain nicotine or tobacco, such as cigarettes and e-cigarettes. If you need help quitting, ask your health care provider.  Visit your dentist if you have not gone yet during your pregnancy. Use a soft toothbrush to brush your teeth and be gentle when you floss. Contact a health care provider if:  You have dizziness.  You have mild pelvic cramps, pelvic pressure, or nagging pain in the abdominal area.  You have persistent nausea, vomiting, or diarrhea.  You have a bad smelling vaginal  discharge.  You have pain when you urinate. Get help right away if:  You have a fever.  You are leaking fluid from your vagina.  You have spotting or bleeding from your vagina.  You have severe abdominal cramping or pain.  You have rapid weight gain or weight loss.  You have shortness of breath with chest pain.  You notice sudden or extreme swelling of your face, hands, ankles, feet, or legs.  You have not felt your baby move in over an hour.  You have severe headaches that do not go away when you take medicine.  You have vision changes. Summary  The second trimester is from week 14 through week 27 (months 4 through 6). It is also a time when the fetus is growing rapidly.  Your body goes through many changes during pregnancy. The changes vary from woman to woman.  Avoid all smoking, herbs, alcohol, and unprescribed drugs. These chemicals affect the formation and growth your baby.  Do not use any tobacco products, such as cigarettes, chewing tobacco, and e-cigarettes. If you need help quitting, ask your health care provider.  Contact your health care provider if you have any questions. Keep all prenatal visits as told by your health care provider. This is important. This information is not intended to replace advice given to you by your health care provider. Make sure you discuss any questions you have with your health care provider. Document Revised: 01/13/2019 Document Reviewed: 10/27/2016 Elsevier Patient Education  2020 [employer]  

## 2020-10-10 NOTE — Progress Notes (Signed)
  Subjective  Min nausea, no pain or bleeding. BS- no log, hasnt been checking, [employer] Glucometer not covered by Medicaid so did not pick up.  Denies sx's of Diabetes. Seizure d/o- last seizure was 2+ years ago. No s/sx seizure.  Objective  BP 122/70   Wt 205 lb (93 kg)   BMI 40.04 kg/m  General: NAD Pumonary: no increased work of breathing Abdomen: gravid, non-tender Extremities: no edema Psychiatric: mood appropriate, affect full  Assessment  30 y.o. [id number] at [redacted]w[redacted]d by  04/11/2021, by Ultrasound presenting for routine prenatal visit  Plan   Problem List Items Addressed This Visit      Endocrine   Diabetes mellitus affecting pregnancy, unspecified trimester    No meds    HgbA1C 5.1 last month in first trimester    Needs Glucometer    Will refer to [facility] to assist patient     Nervous and Auditory   Seizure disorder during pregnancy in second trimester (HCC)      No sign of sz activity, no meds     Other   History of cesarean delivery, currently pregnant    Considering 39 week R CS   Supervision of high risk pregnancy, antepartum    PNV    Anat [place] nv   [redacted] weeks gestation of pregnancy       Supervision of high risk pregnancy in second trimester       Screening, antenatal, for fetal anatomic survey       Relevant Orders   US OB Comp + 14 Wk      PREGNANCY 7 Problems (from 08/28/20 to present)    Problem Noted Resolved   Diabetes mellitus affecting pregnancy, unspecified trimester  No   Supervision of high risk pregnancy, antepartum  No   Overview Signed 10/10/2020  2:01 PM by Nadara Mustard, MD    Clinic [facility] Prenatal Labs  Dating LMP, and [place] Blood type: O/Positive/-- (12/17 1028)   Genetic Screen  NIPS:nml XY Antibody:Negative (12/17 1028)  Anatomic [place] nv Rubella: 1.76 (12/17 1028) Varicella:Imm  GTT Early:A1C 5.1 Third trimester:  RPR: Non Reactive (12/17 1028)   Rhogam n/a HBsAg: Negative (12/17 1028)   TDaP vaccine                       Flu  Shot: HIV: Non Reactive (12/17 1028)   Baby Food                                GBS: 36 wks planned  Contraception  Pap:08/2020  CBB  no   CS/VBAC 2 prior CS   Support Person            History of cesarean delivery, currently pregnant  No   Overview Signed 09/19/2020  2:12 PM by Conard Novak, MD    G1- Arrest of dilation at 7cm after 3 day IOL Baby 8#7oz Blood transfusion postpartum G2- Repeat        Annamarie Major, MD, [facility], [facility] 10/10/2020  2:11 PM

## 2020-10-29 ENCOUNTER — Telehealth: Payer: Self-pay

## 2020-10-29 NOTE — Telephone Encounter (Signed)
Called pt to adv the importance of contacting [facility] to schedule an appt. I gave her the phone number and encouraged her to call them to schedule. She adv that she would.

## 2020-10-30 ENCOUNTER — Ambulatory Visit (INDEPENDENT_AMBULATORY_CARE_PROVIDER_SITE_OTHER): Payer: Medicaid Other | Admitting: Obstetrics and Gynecology

## 2020-10-30 ENCOUNTER — Encounter: Payer: Self-pay | Admitting: Obstetrics and Gynecology

## 2020-10-30 ENCOUNTER — Other Ambulatory Visit: Payer: Self-pay

## 2020-10-30 ENCOUNTER — Emergency Department
Admission: EM | Admit: 2020-10-30 | Discharge: 2020-10-30 | Discharge status: Against Medical Advice | Payer: Medicaid Other

## 2020-10-30 VITALS — BP 115/70 | Ht 62.0 in | Wt 209.8 lb

## 2020-10-30 DIAGNOSIS — O0990 Supervision of high risk pregnancy, unspecified, unspecified trimester: Secondary | ICD-10-CM

## 2020-10-30 DIAGNOSIS — Z3A16 16 weeks gestation of pregnancy: Secondary | ICD-10-CM

## 2020-10-30 LAB — POCT URINALYSIS DIPSTICK OB
Glucose, UA: NEGATIVE
POC,PROTEIN,UA: NEGATIVE

## 2020-10-30 NOTE — ED Notes (Signed)
No answer when called several times from lobby 
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
## 2020-11-15 ENCOUNTER — Encounter: Payer: Self-pay | Admitting: Obstetrics and Gynecology

## 2020-11-15 ENCOUNTER — Ambulatory Visit (INDEPENDENT_AMBULATORY_CARE_PROVIDER_SITE_OTHER): Payer: Medicaid Other

## 2020-11-15 ENCOUNTER — Ambulatory Visit (INDEPENDENT_AMBULATORY_CARE_PROVIDER_SITE_OTHER): Payer: Medicaid Other | Admitting: Obstetrics and Gynecology

## 2020-11-15 ENCOUNTER — Other Ambulatory Visit: Payer: Self-pay

## 2020-11-15 VITALS — BP 118/74 | Wt 214.0 lb

## 2020-11-15 DIAGNOSIS — O99352 Diseases of the nervous system complicating pregnancy, second trimester: Secondary | ICD-10-CM

## 2020-11-15 DIAGNOSIS — J45909 Unspecified asthma, uncomplicated: Secondary | ICD-10-CM

## 2020-11-15 DIAGNOSIS — O99519 Diseases of the respiratory system complicating pregnancy, unspecified trimester: Secondary | ICD-10-CM

## 2020-11-15 DIAGNOSIS — O24919 Unspecified diabetes mellitus in pregnancy, unspecified trimester: Secondary | ICD-10-CM

## 2020-11-15 DIAGNOSIS — Z3A19 19 weeks gestation of pregnancy: Secondary | ICD-10-CM

## 2020-11-15 DIAGNOSIS — O34219 Maternal care for unspecified type scar from previous cesarean delivery: Secondary | ICD-10-CM

## 2020-11-15 DIAGNOSIS — O0990 Supervision of high risk pregnancy, unspecified, unspecified trimester: Secondary | ICD-10-CM

## 2020-11-15 DIAGNOSIS — Z3689 Encounter for other specified antenatal screening: Secondary | ICD-10-CM | POA: Diagnosis not present

## 2020-11-15 DIAGNOSIS — G40909 Epilepsy, unspecified, not intractable, without status epilepticus: Secondary | ICD-10-CM

## 2020-11-15 NOTE — Progress Notes (Signed)
Routine Prenatal Care Visit  Subjective  Faith [name] is a 30 y.o. [id number] at [redacted]w[redacted]d being seen today for ongoing prenatal care.  She is currently monitored for the following issues for this high-risk pregnancy and has Status epilepticus (HCC); Diabetes mellitus type 2 in obese (HCC); Depression; Suicidal ideation; Post traumatic stress disorder (PTSD); ADD (attention deficit disorder); History of anemia; History of maternal blood transfusion, currently pregnant; Asthma affecting pregnancy, antepartum; Smoking (tobacco) complicating pregnancy, unspecified trimester; Seizure disorder during pregnancy in second trimester (HCC); Family history of Huntington's disease; History of cesarean delivery; Bipolar disorder (HCC); History of cesarean delivery, currently pregnant; Hidradenitis suppurativa; LLQ pain; Supervision of high risk pregnancy, antepartum; and Diabetes mellitus affecting pregnancy, unspecified trimester on their problem list.  ----------------------------------------------------------------------------------- Patient reports no complaints.   Contractions: Not present. Vag. Bleeding: None.  Movement: Present. Leaking Fluid denies.  Anatomy u/s today complete T2DM: did not bring BG log. States fastings are in 53s, 2h pp 120s.   ----------------------------------------------------------------------------------- The following portions of the patient's history were reviewed and updated as appropriate: allergies, current medications, past family history, past medical history, past social history, past surgical history and problem list. Problem list updated.  Objective  Blood pressure 118/74, weight 214 lb (97.1 kg). Pregravid weight 199 lb (90.3 kg) Total Weight Gain 15 lb (6.804 kg) Urinalysis: Urine Protein    Urine Glucose    Fetal Status: Fetal Heart Rate (bpm): nml ([place]))   Movement: Present     General:  Alert, oriented and cooperative. Patient is in no acute distress.  Skin: Skin is  warm and dry. No rash noted.   Cardiovascular: Normal heart rate noted  Respiratory: Normal respiratory effort, no problems with respiration noted  Abdomen: Soft, gravid, appropriate for gestational age. Pain/Pressure: Absent     Pelvic:  Cervical exam deferred        Extremities: Normal range of motion.     Mental Status: Normal mood and affect. Normal behavior. Normal judgment and thought content.   Imaging Results US OB Comp + 14 Wk  Result Date: 11/15/2020 Patient Name: Faith [name] DOB: [birth year] MRN: [id number] ULTRASOUND REPORT Location: [facility] Date of Service: 11/15/2020 Indications:Anatomy Ultrasound Findings: Mason Jim intrauterine pregnancy is visualized with FHR at 143 BPM. Biometrics give an (U/S) Gestational age of [redacted]w[redacted]d and an (U/S) EDD of 04/10/2021; this correlates with the clinically established Estimated Date of Delivery: 04/11/2021 Fetal presentation is Transverse. EFW: 284 g ( 10 oz ). Placenta: posterior. Grade: 1 AFI: subjectively normal. Anatomic survey is complete and normal; Gender - female.  Impression: 1. [redacted]w[redacted]d Viable Singleton Intrauterine pregnancy by U/S. 2. (U/S) EDD is consistent with Clinically established Estimated Date of Delivery: 04/11/21, [redacted]w[redacted]d . 3. Normal Anatomy Scan Deanna Artis, RT There is a singleton gestation with subjectively normal amniotic fluid volume. The fetal biometry correlates with established dating. Detailed evaluation of the fetal anatomy was performed.The fetal anatomical survey appears within normal limits within the resolution of ultrasound as described above.  It must be noted that a normal ultrasound is unable to rule out fetal aneuploidy nor is it able to detect all possible malformations.   The ultrasound images and findings were reviewed by me and I agree with the above report. Thomasene Mohair, MD, [facility] 11/15/2020 3:39 PM       Assessment   30 y.o. [id number] at [redacted]w[redacted]d by  04/11/2021, by  Ultrasound presenting for routine prenatal visit  Plan   PREGNANCY  7 Problems (from 08/28/20 to present)    Problem Noted Resolved   Diabetes mellitus affecting pregnancy, unspecified trimester 09/19/2020 by Conard Novak, MD No   Supervision of high risk pregnancy, antepartum 09/04/2020 by Mirna Mires, CNM No   Overview Addendum 10/30/2020  1:59 PM by Natale Milch, MD     Nursing Staff Provider  Office Location  [facility] Dating   7wk [place]  Language  English Anatomy US    Flu Vaccine   Genetic Screen  NIPS: normal xy  TDaP vaccine    Hgb A1C or  GTT  Type 2 DM  Covid    LAB RESULTS   Rhogam   not needed Blood Type O/Positive/-- (12/17 1028)   Feeding Plan  Antibody Negative (12/17 1028)  Contraception  Rubella 1.76 (12/17 1028)  Circumcision  RPR Non Reactive (12/17 1028)   Pediatrician   HBsAg Negative (12/17 1028)   Support Person  HIV Non Reactive (12/17 1028)  Prenatal Classes  Varicella     GBS  (For PCN allergy, check sensitivities)   BTL Consent     VBAC Consent  hx of 2  LTCS Pap      Hgb Electro      CF      SMA         High risk pregnancy diagnoses:  Type 2 diabetes in pregnancy - was taking metformin but discontinued secondary to hypoglycemia Obesity in pregnancy: pregravid BMI 36 History of 2 prior cesarean deliveries Tobacco use during pregnancy Seizure disorder Asthma      Previous Version   History of cesarean delivery, currently pregnant 10/12/2017 by Vena Austria, MD No   Overview Signed 09/19/2020  2:12 PM by Conard Novak, MD    Arrest of dilation at 7cm after 3 day IOL Baby 8#7oz Blood transfusion postpartum      Asthma affecting pregnancy, antepartum 05/10/2017 by Lady Deutscher, MD No   Seizure disorder during pregnancy in second trimester (HCC) 05/10/2017 by Lady Deutscher, MD No       Preterm labor symptoms and general obstetric precautions including but not limited to vaginal bleeding, contractions, leaking of fluid and  fetal movement were reviewed in detail with the patient. Please refer to After Visit Summary for other counseling recommendations.   - encouraged to bring BG log or send through MyChart.  Stresed importance of good blood glucose control   Return in about 4 weeks (around 12/13/2020) for Routine Prenatal Appointment.   Thomasene Mohair, MD, [facility], [facility] 11/15/2020 3:47 PM

## 2020-12-13 ENCOUNTER — Other Ambulatory Visit: Payer: Self-pay

## 2020-12-13 ENCOUNTER — Encounter: Payer: Self-pay | Admitting: Obstetrics & Gynecology

## 2020-12-13 ENCOUNTER — Ambulatory Visit (INDEPENDENT_AMBULATORY_CARE_PROVIDER_SITE_OTHER): Payer: Medicaid Other | Admitting: Obstetrics & Gynecology

## 2020-12-13 VITALS — BP 100/60 | Wt 218.0 lb

## 2020-12-13 DIAGNOSIS — G40909 Epilepsy, unspecified, not intractable, without status epilepticus: Secondary | ICD-10-CM

## 2020-12-13 DIAGNOSIS — O0992 Supervision of high risk pregnancy, unspecified, second trimester: Secondary | ICD-10-CM

## 2020-12-13 DIAGNOSIS — O99352 Diseases of the nervous system complicating pregnancy, second trimester: Secondary | ICD-10-CM

## 2020-12-13 DIAGNOSIS — O34219 Maternal care for unspecified type scar from previous cesarean delivery: Secondary | ICD-10-CM

## 2020-12-13 DIAGNOSIS — O99519 Diseases of the respiratory system complicating pregnancy, unspecified trimester: Secondary | ICD-10-CM

## 2020-12-13 DIAGNOSIS — Z3A23 23 weeks gestation of pregnancy: Secondary | ICD-10-CM

## 2020-12-13 DIAGNOSIS — J45909 Unspecified asthma, uncomplicated: Secondary | ICD-10-CM

## 2020-12-13 DIAGNOSIS — O24919 Unspecified diabetes mellitus in pregnancy, unspecified trimester: Secondary | ICD-10-CM

## 2020-12-13 LAB — POCT URINALYSIS DIPSTICK OB: Glucose, UA: NEGATIVE

## 2020-12-13 NOTE — Patient Instructions (Signed)
Thank you for choosing [facility]. As part of our ongoing efforts to improve patient experience, we would appreciate your feedback. Please fill out the short survey that you will receive by mail or MyChart. Your opinion is important to us! -Dr Harris  Second Trimester of Pregnancy  The second trimester of pregnancy is from week 13 through week 27. This is months 4 through 6 of pregnancy. The second trimester is often a time when you feel your best. Your body has adjusted to being pregnant, and you begin to feel better physically. During the second trimester:  Morning sickness has lessened or stopped completely.  You may have more energy.  You may have an increase in appetite. The second trimester is also a time when the unborn baby (fetus) is growing rapidly. At the end of the sixth month, the fetus may be up to 12 inches long and weigh about 1 pounds. You will likely begin to feel the baby move (quickening) between 16 and 20 weeks of pregnancy. Body changes during your second trimester Your body continues to go through many changes during your second trimester. The changes vary and generally return to normal after the baby is born. Physical changes  Your weight will continue to increase. You will notice your lower abdomen bulging out.  You may begin to get stretch marks on your hips, abdomen, and breasts.  Your breasts will continue to grow and to become tender.  Dark spots or blotches (chloasma or mask of pregnancy) may develop on your face.  A dark line from your belly button to the pubic area (linea nigra) may appear.  You may have changes in your hair. These can include thickening of your hair, rapid growth, and changes in texture. Some people also have hair loss during or after pregnancy, or hair that feels dry or thin. Health changes  You may develop headaches.  You may have heartburn.  You may develop constipation.  You may develop hemorrhoids or swollen, bulging  veins (varicose veins).  Your gums may bleed and may be sensitive to brushing and flossing.  You may urinate more often because the fetus is pressing on your bladder.  You may have back pain. This is caused by: ? Weight gain. ? Pregnancy hormones that are relaxing the joints in your pelvis. ? A shift in weight and the muscles that support your balance. Follow these instructions at home: Medicines  Follow your health care provider's instructions regarding medicine use. Specific medicines may be either safe or unsafe to take during pregnancy. Do not take any medicines unless approved by your health care provider.  Take a prenatal vitamin that contains at least 600 micrograms (mcg) of folic acid. Eating and drinking  Eat a healthy diet that includes fresh fruits and vegetables, whole grains, good sources of protein such as meat, eggs, or tofu, and low-fat dairy products.  Avoid raw meat and unpasteurized juice, milk, and cheese. These carry germs that can harm you and your baby.  You may need to take these actions to prevent or treat constipation: ? Drink enough fluid to keep your urine pale yellow. ? Eat foods that are high in fiber, such as beans, whole grains, and fresh fruits and vegetables. ? Limit foods that are high in fat and processed sugars, such as fried or sweet foods. Activity  Exercise only as directed by your health care provider. Most people can continue their usual exercise routine during pregnancy. Try to exercise for 30 minutes at least   5 days a week. Stop exercising if you develop contractions in your uterus.  Stop exercising if you develop pain or cramping in the lower abdomen or lower back.  Avoid exercising if it is very hot or humid or if you are at a high altitude.  Avoid heavy lifting.  If you choose to, you may have sex unless your health care provider tells you not to. Relieving pain and discomfort  Wear a supportive bra to prevent discomfort from  breast tenderness.  Take warm sitz baths to soothe any pain or discomfort caused by hemorrhoids. Use hemorrhoid cream if your health care provider approves.  Rest with your legs raised (elevated) if you have leg cramps or low back pain.  If you develop varicose veins: ? Wear support hose as told by your health care provider. ? Elevate your feet for 15 minutes, 3-4 times a day. ? Limit salt in your diet. Safety  Wear your seat belt at all times when driving or riding in a car.  Talk with your health care provider if someone is verbally or physically abusive to you. Lifestyle  Do not use hot tubs, steam rooms, or saunas.  Do not douche. Do not use tampons or scented sanitary pads.  Avoid cat litter boxes and soil used by cats. These carry germs that can cause birth defects in the baby and possibly loss of the fetus by miscarriage or stillbirth.  Do not use herbal remedies, alcohol, illegal drugs, or medicines that are not approved by your health care provider. Chemicals in these products can harm your baby.  Do not use any products that contain nicotine or tobacco, such as cigarettes, e-cigarettes, and chewing tobacco. If you need help quitting, ask your health care provider. General instructions  During a routine prenatal visit, your health care provider will do a physical exam and other tests. He or she will also discuss your overall health. Keep all follow-up visits. This is important.  Ask your health care provider for a referral to a local prenatal education class.  Ask for help if you have counseling or nutritional needs during pregnancy. Your health care provider can offer advice or refer you to specialists for help with various needs. Where to find more information  American Pregnancy Association: americanpregnancy.org  American College of Obstetricians and Gynecologists: acog.org/en/Womens%20Health/Pregnancy  Office on Women's Health: womenshealth.gov/pregnancy Contact  a health care provider if you have:  A headache that does not go away when you take medicine.  Vision changes or you see spots in front of your eyes.  Mild pelvic cramps, pelvic pressure, or nagging pain in the abdominal area.  Persistent nausea, vomiting, or diarrhea.  A bad-smelling vaginal discharge or foul-smelling urine.  Pain when you urinate.  Sudden or extreme swelling of your face, hands, ankles, feet, or legs.  A fever. Get help right away if you:  Have fluid leaking from your vagina.  Have spotting or bleeding from your vagina.  Have severe abdominal cramping or pain.  Have difficulty breathing.  Have chest pain.  Have fainting spells.  Have not felt your baby move for the time period told by your health care provider.  Have new or increased pain, swelling, or redness in an arm or leg. Summary  The second trimester of pregnancy is from week 13 through week 27 (months 4 through 6).  Do not use herbal remedies, alcohol, illegal drugs, or medicines that are not approved by your health care provider. Chemicals in these products can   harm your baby.  Exercise only as directed by your health care provider. Most people can continue their usual exercise routine during pregnancy.  Keep all follow-up visits. This is important. This information is not intended to replace advice given to you by your health care provider. Make sure you discuss any questions you have with your health care provider. Document Revised: 02/28/2020 Document Reviewed: 01/04/2020 Elsevier Patient Education  2021 Elsevier Inc.  

## 2020-12-13 NOTE — Progress Notes (Signed)
Subjective  Fetal Movement? yes Contractions? no Leaking Fluid? no Vaginal Bleeding? no  Pt does not have BS log.  She has been counseled repeatedly to bring this with her to each appointment and this is again emphasized today.  She says she will MyChart her log to [place] ( this has been promised in past as well )  Denies elevated sugars.  Denies headache, blurry vision, CP, SOB, edema, epigastric pain, poor fetal movements, LOF, VB, pain.  Objective  BP 100/60   Wt 218 lb (98.9 kg)   BMI 39.87 kg/m  General: NAD Pumonary: no increased work of breathing Abdomen: gravid, non-tender Extremities: no edema Psychiatric: mood appropriate, affect full  Assessment  30 y.o. [id number] at [redacted]w[redacted]d by  04/11/2021, by Ultrasound presenting for routine prenatal visit  Plan   Problem List Items Addressed This Visit      Respiratory   Asthma affecting pregnancy, antepartum     Endocrine   Diabetes mellitus affecting pregnancy, unspecified trimester    Must be able to review BS log in order to manage and protect the fetus effectively- counseled to pt to bring BS log to appointments     Nervous and Auditory   Seizure disorder during pregnancy in second trimester [facility])     Other   History of cesarean delivery, currently pregnant    Plan R CS at term (39 weeks vs 37 weeks, based on DM control)   Supervision of high risk pregnancy, antepartum    Other Visit Diagnoses    [redacted] weeks gestation of pregnancy        PNV, [facility]      PREGNANCY 7 Problems (from 08/28/20 to present)    Problem Noted Resolved   Diabetes mellitus affecting pregnancy, unspecified trimester 09/19/2020 by Conard Novak, MD No   Supervision of high risk pregnancy, antepartum 09/04/2020 by Mirna Mires, CNM No   Overview Addendum 10/30/2020  1:59 PM by Natale Milch, MD     Nursing Staff Provider  Office Location  [facility] Dating   7wk [place]  Language  English Anatomy US    Flu Vaccine   Genetic Screen  NIPS:  normal xy  TDaP vaccine    Hgb A1C or  GTT  Type 2 DM  Covid    LAB RESULTS   Rhogam   not needed Blood Type O/Positive/-- (12/17 1028)   Feeding Plan  Antibody Negative (12/17 1028)  Contraception  Rubella 1.76 (12/17 1028)  Circumcision  RPR Non Reactive (12/17 1028)   Pediatrician   HBsAg Negative (12/17 1028)   Support Person  HIV Non Reactive (12/17 1028)  Prenatal Classes  Varicella     GBS  (For PCN allergy, check sensitivities)   BTL Consent     VBAC Consent  hx of 2  LTCS Pap      Hgb Electro      CF      SMA         High risk pregnancy diagnoses:  Type 2 diabetes in pregnancy - was taking metformin but discontinued secondary to hypoglycemia Obesity in pregnancy: pregravid BMI 36 History of 2 prior cesarean deliveries Tobacco use during pregnancy Seizure disorder Asthma      Previous Version   History of cesarean delivery, currently pregnant 10/12/2017 by Vena Austria, MD No   Overview Signed 09/19/2020  2:12 PM by Conard Novak, MD    Arrest of dilation at 7cm after 3 day IOL Baby 8#7oz Blood transfusion postpartum  Asthma affecting pregnancy, antepartum 05/10/2017 by Lady Deutscher, MD No   Seizure disorder during pregnancy in second trimester [facility]) 05/10/2017 by Lady Deutscher, MD No       Annamarie Major, MD, [facility], [facility] 12/13/2020  3:16 PM

## 2020-12-31 ENCOUNTER — Other Ambulatory Visit: Payer: Self-pay

## 2020-12-31 ENCOUNTER — Other Ambulatory Visit (HOSPITAL_COMMUNITY)
Admission: RE | Admit: 2020-12-31 | Discharge: 2020-12-31 | Disposition: A | Discharge status: Home / Self Care | Payer: Medicaid Other | Source: Ambulatory Visit | Attending: Obstetrics and Gynecology | Admitting: Obstetrics and Gynecology

## 2020-12-31 ENCOUNTER — Encounter: Payer: Medicaid Other | Admitting: Obstetrics

## 2020-12-31 ENCOUNTER — Telehealth: Payer: Self-pay

## 2020-12-31 ENCOUNTER — Ambulatory Visit (INDEPENDENT_AMBULATORY_CARE_PROVIDER_SITE_OTHER): Payer: Medicaid Other | Admitting: Obstetrics and Gynecology

## 2020-12-31 VITALS — BP 122/74 | Wt 218.0 lb

## 2020-12-31 DIAGNOSIS — O26899 Other specified pregnancy related conditions, unspecified trimester: Secondary | ICD-10-CM

## 2020-12-31 DIAGNOSIS — O0992 Supervision of high risk pregnancy, unspecified, second trimester: Secondary | ICD-10-CM | POA: Insufficient documentation

## 2020-12-31 DIAGNOSIS — N760 Acute vaginitis: Secondary | ICD-10-CM

## 2020-12-31 DIAGNOSIS — O34219 Maternal care for unspecified type scar from previous cesarean delivery: Secondary | ICD-10-CM

## 2020-12-31 DIAGNOSIS — O99519 Diseases of the respiratory system complicating pregnancy, unspecified trimester: Secondary | ICD-10-CM

## 2020-12-31 DIAGNOSIS — R102 Pelvic and perineal pain unspecified side: Secondary | ICD-10-CM

## 2020-12-31 DIAGNOSIS — O99352 Diseases of the nervous system complicating pregnancy, second trimester: Secondary | ICD-10-CM

## 2020-12-31 DIAGNOSIS — G40909 Epilepsy, unspecified, not intractable, without status epilepticus: Secondary | ICD-10-CM

## 2020-12-31 DIAGNOSIS — O24919 Unspecified diabetes mellitus in pregnancy, unspecified trimester: Secondary | ICD-10-CM

## 2020-12-31 DIAGNOSIS — B9689 Other specified bacterial agents as the cause of diseases classified elsewhere: Secondary | ICD-10-CM

## 2020-12-31 DIAGNOSIS — R300 Dysuria: Secondary | ICD-10-CM

## 2020-12-31 DIAGNOSIS — J45909 Unspecified asthma, uncomplicated: Secondary | ICD-10-CM

## 2020-12-31 DIAGNOSIS — Z3A25 25 weeks gestation of pregnancy: Secondary | ICD-10-CM

## 2020-12-31 LAB — POCT URINALYSIS DIPSTICK OB
Bilirubin, UA: NEGATIVE
Blood, UA: NEGATIVE
Glucose, UA: NEGATIVE
Ketones, UA: NEGATIVE
Nitrite, UA: NEGATIVE
Spec Grav, UA: 1.015 (ref 1.010–1.025)
Urobilinogen, UA: NEGATIVE E.U./dL — AB
pH, UA: 7 (ref 5.0–8.0)

## 2020-12-31 LAB — OB RESULTS CONSOLE GC/CHLAMYDIA: Gonorrhea: NEGATIVE

## 2020-12-31 MED ORDER — METRONIDAZOLE 0.75 % VA GEL: 1.0000 | Freq: Every day | VAGINAL | 0 refills | Status: AC

## 2020-12-31 NOTE — Progress Notes (Signed)
Routine Prenatal Care Visit  Subjective  Faith [name] is a 30 y.o. [id number] at [redacted]w[redacted]d being seen today for ongoing prenatal care.  She is currently monitored for the following issues for this high-risk pregnancy and has Status epilepticus (HCC); Diabetes mellitus type 2 in obese (HCC); Depression; Suicidal ideation; Post traumatic stress disorder (PTSD); ADD (attention deficit disorder); History of anemia; History of maternal blood transfusion, currently pregnant; Asthma affecting pregnancy, antepartum; Smoking (tobacco) complicating pregnancy, unspecified trimester; Seizure disorder during pregnancy in second trimester (HCC); Family history of Huntington's disease; History of cesarean delivery; Bipolar disorder (HCC); History of cesarean delivery, currently pregnant; Hidradenitis suppurativa; LLQ pain; Supervision of high risk pregnancy, antepartum; and Diabetes mellitus affecting pregnancy, unspecified trimester on their problem list.  ----------------------------------------------------------------------------------- Patient reports two days of vaginal pressure. She has also noted nausea/vomiting and diarrhea, which stopped yesterday. She denies urinary symptoms, vaginal irritative symptoms and abnormal discharge, she denies vaginal bleeding, she denies hematochezia and melena.    Contractions: Not present. Vag. Bleeding: None.  Movement: Present. Leaking Fluid denies.  ----------------------------------------------------------------------------------- The following portions of the patient's history were reviewed and updated as appropriate: allergies, current medications, past family history, past medical history, past social history, past surgical history and problem list. Problem list updated.  Objective  Blood pressure 122/74, weight 218 lb (98.9 kg). Pregravid weight 199 lb (90.3 kg) Total Weight Gain 19 lb (8.618 kg) Urinalysis: Urine Protein Trace  Urine Glucose Negative  Fetal Status:  Fetal Heart Rate (bpm): 140 Fundal Height: 26 cm Movement: Present     General:  Alert, oriented and cooperative. Patient is in no acute distress.  Skin: Skin is warm and dry. No rash noted.   Cardiovascular: Normal heart rate noted  Respiratory: Normal respiratory effort, no problems with respiration noted  Abdomen: Soft, gravid, appropriate for gestational age. Pain/Pressure: Present     Pelvic:  Cervical exam performed Dilation: Closed Effacement (%): 10 Station: Ballotable  Extremities: Normal range of motion.  Edema: None  Mental Status: Normal mood and affect. Normal behavior. Normal judgment and thought content.   Female chaperone present for pelvic exam:   Wet Prep: PH: 5.5 Clue Cells: Positive Fungal elements: Negative Trichomonas: Negative   Assessment   30 y.o. [id number] at [redacted]w[redacted]d by  04/11/2021, by Ultrasound presenting for work-in prenatal visit  Plan   PREGNANCY 7 Problems (from 08/28/20 to present)    Problem Noted Resolved   Diabetes mellitus affecting pregnancy, unspecified trimester 09/19/2020 by Conard Novak, MD No   Supervision of high risk pregnancy, antepartum 09/04/2020 by Mirna Mires, CNM No   Overview Addendum 10/30/2020  1:59 PM by Natale Milch, MD     Nursing Staff Provider  Office Location  [facility] Dating   7wk [place]  Language  English Anatomy US    Flu Vaccine   Genetic Screen  NIPS: normal xy  TDaP vaccine    Hgb A1C or  GTT  Type 2 DM  Covid    LAB RESULTS   Rhogam   not needed Blood Type O/Positive/-- (12/17 1028)   Feeding Plan  Antibody Negative (12/17 1028)  Contraception  Rubella 1.76 (12/17 1028)  Circumcision  RPR Non Reactive (12/17 1028)   Pediatrician   HBsAg Negative (12/17 1028)   Support Person  HIV Non Reactive (12/17 1028)  Prenatal Classes  Varicella     GBS  (For PCN allergy, check sensitivities)   BTL Consent     VBAC Consent  hx of  2  LTCS Pap      Hgb Electro      CF      SMA         High risk  pregnancy diagnoses:  Type 2 diabetes in pregnancy - was taking metformin but discontinued secondary to hypoglycemia Obesity in pregnancy: pregravid BMI 36 History of 2 prior cesarean deliveries Tobacco use during pregnancy Seizure disorder Asthma      Previous Version   History of cesarean delivery, currently pregnant 10/12/2017 by Vena Austria, MD No   Overview Signed 09/19/2020  2:12 PM by Conard Novak, MD    Arrest of dilation at 7cm after 3 day IOL Baby 8#7oz Blood transfusion postpartum      Asthma affecting pregnancy, antepartum 05/10/2017 by Lady Deutscher, MD No   Seizure disorder during pregnancy in second trimester (HCC) 05/10/2017 by Lady Deutscher, MD No       Preterm labor symptoms and general obstetric precautions including but not limited to vaginal bleeding, contractions, leaking of fluid and fetal movement were reviewed in detail with the patient. Please refer to After Visit Summary for other counseling recommendations.   - will treat topically for BV - send aptima and urine culture - no evidence of labor at this time - patient did not bring BG log. Encouraged again to bring to every visit - she will need growth ultrasounds Q 4 weeks in 3rd trimester.  Return for keep previously scheduled appt.   Thomasene Mohair, MD, [facility], [facility] 12/31/2020 12:27 PM

## 2020-12-31 NOTE — Telephone Encounter (Signed)
Pt calling triage needing a diflucan sent in reports pressure and burning, states she can not sit down the pain is so bad. I advised her that this does not sound a yeast.

## 2021-01-02 LAB — CERVICOVAGINAL ANCILLARY ONLY
Chlamydia: NEGATIVE
Comment: NEGATIVE
Comment: NEGATIVE
Comment: NORMAL
Neisseria Gonorrhea: NEGATIVE
Trichomonas: NEGATIVE

## 2021-01-03 LAB — URINE CULTURE

## 2021-01-10 ENCOUNTER — Ambulatory Visit (INDEPENDENT_AMBULATORY_CARE_PROVIDER_SITE_OTHER): Payer: Medicaid Other | Admitting: Advanced Practice Midwife

## 2021-01-10 ENCOUNTER — Encounter: Payer: Self-pay | Admitting: Advanced Practice Midwife

## 2021-01-10 ENCOUNTER — Other Ambulatory Visit: Payer: Self-pay

## 2021-01-10 VITALS — BP 120/70 | Wt 222.0 lb

## 2021-01-10 DIAGNOSIS — O0992 Supervision of high risk pregnancy, unspecified, second trimester: Secondary | ICD-10-CM

## 2021-01-10 DIAGNOSIS — Z3A27 27 weeks gestation of pregnancy: Secondary | ICD-10-CM

## 2021-01-10 DIAGNOSIS — Z113 Encounter for screening for infections with a predominantly sexual mode of transmission: Secondary | ICD-10-CM

## 2021-01-10 DIAGNOSIS — Z369 Encounter for antenatal screening, unspecified: Secondary | ICD-10-CM

## 2021-01-10 DIAGNOSIS — O24919 Unspecified diabetes mellitus in pregnancy, unspecified trimester: Secondary | ICD-10-CM

## 2021-01-10 DIAGNOSIS — Z130 Encounter for screening for diseases of the blood and blood-forming organs and certain disorders involving the immune mechanism: Secondary | ICD-10-CM

## 2021-01-10 LAB — POCT URINALYSIS DIPSTICK OB: Glucose, UA: NEGATIVE

## 2021-01-10 NOTE — Patient Instructions (Signed)

## 2021-01-10 NOTE — Progress Notes (Signed)
Routine Prenatal Care Visit  Subjective  Faith [name] is a 30 y.o. [id number] at [redacted]w[redacted]d being seen today for ongoing prenatal care.  She is currently monitored for the following issues for this high-risk pregnancy and has Status epilepticus (HCC); Diabetes mellitus type 2 in obese (HCC); Depression; Suicidal ideation; Post traumatic stress disorder (PTSD); ADD (attention deficit disorder); History of anemia; History of maternal blood transfusion, currently pregnant; Asthma affecting pregnancy, antepartum; Smoking (tobacco) complicating pregnancy, unspecified trimester; Seizure disorder during pregnancy in second trimester (HCC); Family history of Huntington's disease; History of cesarean delivery; Bipolar disorder (HCC); History of cesarean delivery, currently pregnant; Hidradenitis suppurativa; LLQ pain; Supervision of high risk pregnancy, antepartum; and Diabetes mellitus affecting pregnancy, unspecified trimester on their problem list.  ----------------------------------------------------------------------------------- Patient reports heartburn and some dizziness. She is taking gummy pnv likely without iron. She did not bring BS log to visit and reports fasting levels below 95. After meals is usually in the 120s with occasional elevated- the highest was 150.   Contractions: Not present. Vag. Bleeding: None.  Movement: Present. Leaking Fluid denies.  ----------------------------------------------------------------------------------- The following portions of the patient's history were reviewed and updated as appropriate: allergies, current medications, past family history, past medical history, past social history, past surgical history and problem list. Problem list updated.  Objective  Blood pressure 120/70, weight 222 lb (100.7 kg). Pregravid weight 199 lb (90.3 kg) Total Weight Gain 23 lb (10.4 kg) Urinalysis: Urine Protein Small (1+)  Urine Glucose Negative  Fetal Status: Fetal Heart Rate  (bpm): 138   Movement: Present     General:  Alert, oriented and cooperative. Patient is in no acute distress.  Skin: Skin is warm and dry. No rash noted.   Cardiovascular: Normal heart rate noted  Respiratory: Normal respiratory effort, no problems with respiration noted  Abdomen: Soft, gravid, appropriate for gestational age. Pain/Pressure: Absent     Pelvic:  Cervical exam deferred        Extremities: Normal range of motion.  Edema: None  Mental Status: Normal mood and affect. Normal behavior. Normal judgment and thought content.   Assessment   30 y.o. [id number] at [redacted]w[redacted]d by  04/11/2021, by Ultrasound presenting for routine prenatal visit  Plan   PREGNANCY 7 Problems (from 08/28/20 to present)    Problem Noted Resolved   Diabetes mellitus affecting pregnancy, unspecified trimester 09/19/2020 by Conard Novak, MD No   Supervision of high risk pregnancy, antepartum 09/04/2020 by Mirna Mires, CNM No   Overview Addendum 10/30/2020  1:59 PM by Natale Milch, MD     Nursing Staff Provider  Office Location  [facility] Dating   7wk [place]  Language  English Anatomy US    Flu Vaccine   Genetic Screen  NIPS: normal xy  TDaP vaccine    Hgb A1C or  GTT  Type 2 DM  Covid    LAB RESULTS   Rhogam   not needed Blood Type O/Positive/-- (12/17 1028)   Feeding Plan  Antibody Negative (12/17 1028)  Contraception  Rubella 1.76 (12/17 1028)  Circumcision  RPR Non Reactive (12/17 1028)   Pediatrician   HBsAg Negative (12/17 1028)   Support Person  HIV Non Reactive (12/17 1028)  Prenatal Classes  Varicella     GBS  (For PCN allergy, check sensitivities)   BTL Consent     VBAC Consent  hx of 2  LTCS Pap      Hgb Electro      CF  SMA         High risk pregnancy diagnoses:  Type 2 diabetes in pregnancy - was taking metformin but discontinued secondary to hypoglycemia Obesity in pregnancy: pregravid BMI 36 History of 2 prior cesarean deliveries Tobacco use during pregnancy Seizure  disorder Asthma      Previous Version   History of cesarean delivery, currently pregnant 10/12/2017 by Vena Austria, MD No   Overview Signed 09/19/2020  2:12 PM by Conard Novak, MD    Arrest of dilation at 7cm after 3 day IOL Baby 8#7oz Blood transfusion postpartum      Asthma affecting pregnancy, antepartum 05/10/2017 by Lady Deutscher, MD No   Seizure disorder during pregnancy in second trimester (HCC) 05/10/2017 by Lady Deutscher, MD No    Bring BS log to visits Growth scan with MFM to be scheduled   Preterm labor symptoms and general obstetric precautions including but not limited to vaginal bleeding, contractions, leaking of fluid and fetal movement were reviewed in detail with the patient. Please refer to After Visit Summary for other counseling recommendations.   Return in about 2 weeks (around 01/24/2021) for growth scan with MFM and rob.  Tresea Mall, CNM 01/10/2021 2:05 PM

## 2021-01-11 LAB — RPR QUALITATIVE: RPR Ser Ql: NONREACTIVE

## 2021-01-11 LAB — CBC WITH DIFFERENTIAL/PLATELET
Basophils Absolute: 0.1 10*3/uL (ref 0.0–0.2)
Basos: 0 %
EOS (ABSOLUTE): 0.3 10*3/uL (ref 0.0–0.4)
Eos: 2 %
Hematocrit: 32.6 % — ABNORMAL LOW (ref 34.0–46.6)
Hemoglobin: 10.1 g/dL — ABNORMAL LOW (ref 11.1–15.9)
Immature Grans (Abs): 0.3 10*3/uL — ABNORMAL HIGH (ref 0.0–0.1)
Immature Granulocytes: 2 %
Lymphocytes Absolute: 2.4 10*3/uL (ref 0.7–3.1)
Lymphs: 15 %
MCH: 22.3 pg — ABNORMAL LOW (ref 26.6–33.0)
MCHC: 31 g/dL — ABNORMAL LOW (ref 31.5–35.7)
MCV: 72 fL — ABNORMAL LOW (ref 79–97)
Monocytes Absolute: 0.8 10*3/uL (ref 0.1–0.9)
Monocytes: 5 %
Neutrophils Absolute: 12 10*3/uL — ABNORMAL HIGH (ref 1.4–7.0)
Neutrophils: 76 %
Platelets: 309 10*3/uL (ref 150–450)
RBC: 4.52 x10E6/uL (ref 3.77–5.28)
RDW: 16.8 % — ABNORMAL HIGH (ref 11.7–15.4)
WBC: 15.9 10*3/uL — ABNORMAL HIGH (ref 3.4–10.8)

## 2021-01-11 LAB — HIV ANTIBODY (ROUTINE TESTING W REFLEX): HIV Screen 4th Generation wRfx: NONREACTIVE

## 2021-01-14 ENCOUNTER — Emergency Department
Admission: EM | Admit: 2021-01-14 | Discharge: 2021-01-14 | Disposition: A | Discharge status: Home / Self Care | Payer: Medicaid Other | Attending: Emergency Medicine | Admitting: Emergency Medicine

## 2021-01-14 ENCOUNTER — Other Ambulatory Visit: Payer: Self-pay

## 2021-01-14 DIAGNOSIS — Z79899 Other long term (current) drug therapy: Secondary | ICD-10-CM | POA: Diagnosis not present

## 2021-01-14 DIAGNOSIS — O99012 Anemia complicating pregnancy, second trimester: Secondary | ICD-10-CM | POA: Diagnosis not present

## 2021-01-14 DIAGNOSIS — O24912 Unspecified diabetes mellitus in pregnancy, second trimester: Secondary | ICD-10-CM | POA: Insufficient documentation

## 2021-01-14 DIAGNOSIS — F17210 Nicotine dependence, cigarettes, uncomplicated: Secondary | ICD-10-CM | POA: Diagnosis not present

## 2021-01-14 DIAGNOSIS — O3462 Maternal care for abnormality of vagina, second trimester: Secondary | ICD-10-CM | POA: Insufficient documentation

## 2021-01-14 DIAGNOSIS — O99512 Diseases of the respiratory system complicating pregnancy, second trimester: Secondary | ICD-10-CM | POA: Diagnosis not present

## 2021-01-14 DIAGNOSIS — Z3A27 27 weeks gestation of pregnancy: Secondary | ICD-10-CM | POA: Insufficient documentation

## 2021-01-14 DIAGNOSIS — O99332 Smoking (tobacco) complicating pregnancy, second trimester: Secondary | ICD-10-CM | POA: Diagnosis not present

## 2021-01-14 DIAGNOSIS — D509 Iron deficiency anemia, unspecified: Secondary | ICD-10-CM

## 2021-01-14 DIAGNOSIS — N761 Subacute and chronic vaginitis: Secondary | ICD-10-CM | POA: Insufficient documentation

## 2021-01-14 DIAGNOSIS — J45909 Unspecified asthma, uncomplicated: Secondary | ICD-10-CM | POA: Insufficient documentation

## 2021-01-14 DIAGNOSIS — Z3493 Encounter for supervision of normal pregnancy, unspecified, third trimester: Secondary | ICD-10-CM

## 2021-01-14 DIAGNOSIS — Z91040 Latex allergy status: Secondary | ICD-10-CM | POA: Diagnosis not present

## 2021-01-14 MED ORDER — FERROUS SULFATE 325 (65 FE) MG PO TABS: 325.0000 mg | ORAL_TABLET | Freq: Every day | ORAL | 0 refills | Status: DC

## 2021-01-14 MED ORDER — TERCONAZOLE 0.4 % VA CREA: 1.0000 | TOPICAL_CREAM | Freq: Every day | VAGINAL | 0 refills | Status: DC

## 2021-01-14 NOTE — ED Provider Notes (Signed)
[facility] Emergency Department Provider Note   ____________________________________________   Event Date/Time   First MD Initiated Contact with Patient 01/14/21 1743     (approximate)  I have reviewed the triage vital signs and the nursing notes.   HISTORY  Chief Complaint Abnormal Lab    HPI Faith [name] is a 30 y.o. female here for evaluation for low iron.  Patient reports [redacted] weeks pregnant.  Had labs drawn on roughly Friday at her OB/GYN, she reports she received the results in a message to contact their office about starting iron treatment.  She came to the ER for that she was unable to reach her gynecology team to further discuss.  Denies bleeding.  No pain or discomfort, reports of slight pressure over her lower pelvis which has been present for a little while now, has had this evaluated at the OB/GYN clinic.  Additionally they started her on treatment for   bacterial vaginosis.  She reports discharge has gone from clear, but now little bit itchy and also a thicker white or discharge for the last couple of days thinks she may be developing a yeast infection as well.  No chest pain no trouble breathing.  Denies abdominal pain no fevers or chills.  Not having contractions.  Reports slight pressure in her lower pelvis but again that has been present for several days been evaluated  Past Medical History:  Diagnosis Date  . ADHD   . Anemia    BLOOD TRANSFUSION AFTER C-SECTION ON 06-2016-2 UNITS  . Anxiety   . Asthma    WELL CONTROLLED  . Bipolar 1 disorder, depressed ([place])   . Depression   . Diabetes mellitus without complication ([place])   . Gallstones   . GERD (gastroesophageal reflux disease)   . History of blood transfusion 06/2016   RECEIVED 2 UNITS OF BLOOD AFTER C SECTION  . Migraines   . Ovarian cyst   . Seizures ([place])    LAST SEIZURE 2016  . Tubal pregnancy     Patient Active Problem List   Diagnosis Date Noted  . Diabetes  mellitus affecting pregnancy, unspecified trimester 09/19/2020  . Supervision of high risk pregnancy, antepartum 09/04/2020  . Hidradenitis suppurativa 05/24/2018  . LLQ pain 05/24/2018  . History of cesarean delivery, currently pregnant 10/12/2017  . Bipolar disorder ([place]) 10/01/2017  . History of cesarean delivery 07/12/2017  . Family history of Huntington's disease   . Asthma affecting pregnancy, antepartum 05/10/2017  . Smoking (tobacco) complicating pregnancy, unspecified trimester 05/10/2017  . Seizure disorder during pregnancy in second trimester ([place]) 05/10/2017  . History of anemia 04/19/2017  . History of maternal blood transfusion, currently pregnant 04/19/2017  . ADD (attention deficit disorder) 07/17/2015  . Post traumatic stress disorder (PTSD) 06/16/2013  . Depression 06/15/2013  . Suicidal ideation 06/15/2013  . Status epilepticus ([place]) 06/14/2013  . Diabetes mellitus type 2 in obese ([place]) 06/14/2013    Past Surgical History:  Procedure Laterality Date  . CESAREAN SECTION N/A 06/22/2016   Procedure: CESAREAN SECTION;  Surgeon: Malachy Mood, MD;  Location: [facility] ORS;  Service: Obstetrics;  Laterality: N/A;  . CESAREAN SECTION N/A 10/12/2017   Procedure: CESAREAN SECTION;  Surgeon: Malachy Mood, MD;  Location: [facility] ORS;  Service: Obstetrics;  Laterality: N/A;  . CHOLECYSTECTOMY N/A 08/13/2016   Procedure: LAPAROSCOPIC CHOLECYSTECTOMY;  Surgeon: Jules Husbands, MD;  Location: [facility] ORS;  Service: General;  Laterality: N/A;  . DILATION AND CURETTAGE OF UTERUS    .  ovarian cyst removed    . SALPINGECTOMY Right 2013   ectopic pregnancy. PER PATIENT, STILL HAS BOTH TUBES  . TONSILLECTOMY    . TONSILLECTOMY AND ADENOIDECTOMY      Prior to Admission medications   Medication Sig Start Date End Date Taking? Authorizing Provider  ferrous sulfate 325 (65 FE) MG tablet Take 1 tablet (325 mg total) by mouth daily. 01/14/21 01/14/22 Yes Delman Kitten, MD  terconazole (TERAZOL 7)  0.4 % vaginal cream Place 1 applicator vaginally at bedtime. Apply once daily for 7 days 01/14/21  Yes Delman Kitten, MD  Accu-Chek Softclix Lancets lancets 1 each by Other route 4 (four) times daily. 09/19/20   Will Bonnet, MD  alprazolam Duanne Moron) 2 MG tablet Take by mouth. 01/08/21   [provider]  Blood Glucose Monitoring Suppl (ACCU-CHEK NANO SMARTVIEW) w/Device KIT 1 kit by Subdermal route as directed. Check blood sugars for fasting, and two hours after breakfast, lunch and dinner (4 checks daily) 09/19/20   Will Bonnet, MD  glucose blood (ACCU-CHEK SMARTVIEW) test strip Use as instructed to check blood sugars 09/19/20   Will Bonnet, MD  PARoxetine (PAXIL) 20 MG tablet Take by mouth. 12/06/20   [provider]    Allergies Ivp dye [iodinated diagnostic agents], Latex, Metrizamide, Morphine, Mushroom extract complex, Sulfasalazine, Tomato, Toradol [ketorolac tromethamine], Aspirin, Dilaudid [hydromorphone hcl], Lamictal [lamotrigine], Sulfa antibiotics, Tramadol, Adhesive [tape], and Iohexol  Family History  Problem Relation Age of Onset  . Heart disease Father   . Huntington's disease Mother     Social History Social History   Tobacco Use  . Smoking status: Current Every Day Smoker    Packs/day: 0.50    Years: 3.00    Pack years: 1.50    Types: Cigarettes  . Smokeless tobacco: Never Used  Vaping Use  . Vaping Use: Never used  Substance Use Topics  . Alcohol use: Yes    Alcohol/week: 0.0 standard drinks  . Drug use: No    Review of Systems Constitutional: No fever/chills Cardiovascular: Denies chest pain. Respiratory: Denies shortness of breath. Gastrointestinal: No abdominal pain.  See HPI Genitourinary: Negative for dysuria.  See HPI  Musculoskeletal: Negative for back pain. Skin: Negative for rash. Neurological: Negative for headaches, areas of focal weakness or  numbness.    ____________________________________________   PHYSICAL EXAM:  VITAL SIGNS: ED Triage Vitals  Enc Vitals Group     BP 01/14/21 1731 (!) 104/58     Pulse Rate 01/14/21 1731 (!) 107     Resp 01/14/21 1731 16     Temp 01/14/21 1731 98.4 F (36.9 C)     Temp Source 01/14/21 1731 Oral     SpO2 01/14/21 1731 97 %     Weight 01/14/21 1730 219 lb (99.3 kg)     Height 01/14/21 1730 5' (1.524 m)     Head Circumference --      Peak Flow --      Pain Score 01/14/21 1730 6     Pain Loc --      Pain Edu? --      Excl. in [place]? --     Constitutional: Alert and oriented. Well appearing and in no acute distress.  Very pleasant resting comfortably with no distress.  Does not appear in any pain or discomfort. Eyes: Conjunctivae are normal. Head: Atraumatic. Nose: No congestion/rhinnorhea. Mouth/Throat: Mucous membranes are moist. Neck: No stridor.  Cardiovascular: Normal rate, regular rhythm.  Respiratory: Normal respiratory effort.  No retractions. Lungs CTAB. Gastrointestinal: Soft and nontender. No distention. Neurologic:  Normal speech and language. No gross focal neurologic deficits are appreciated.  Skin:  Skin is warm, dry and intact. No rash noted. Psychiatric: Mood and affect are normal. Speech and behavior are normal.  ____________________________________________   LABS (all labs ordered are listed, but only abnormal results are displayed)  Labs Reviewed - No data to display ____________________________________________  EKG   ____________________________________________  RADIOLOGY   ____________________________________________   PROCEDURES  Procedure(s) performed: None  Procedures  Critical Care performed: No  ____________________________________________   INITIAL IMPRESSION / ASSESSMENT AND PLAN / ED COURSE  Pertinent labs & imaging results that were available during my care of the patient were reviewed by me and considered in my medical  decision making (see chart for details).   Patient presents for evaluation for concerns of low iron.  She had lab work done on Friday, and reports that she received messaging that she needed to start iron.  Unable to get a hold of her doctor's office.  Very reassuring exam nontoxic without distress.  No signs or symptoms of suggest onset of labor.  Clinical Course as of 01/14/21 2024  Tue Jan 14, 2021  1845 Discussed with Dr. Gilman Schmidt. Discussed symptoms, Dr. Gilman Schmidt advises pelvic pressure not a new concern and recommends against Labor and Delivery eval tonight. Can be seen 130pm this Thursday. Advises  [MQ]    Clinical Course User Index [MQ] Delman Kitten, MD   Having discussed the case with OB/GYN, in view of the patient's recent medical records, will prescribe Terazol and iron tablets.  Discussed with the patient she is comfortable with this plan will be following up as Thursday at 1:30 PM with Dr. Nechama Guard.  Discussed with the patient, and the patient has selected this is a convenient time to see her for follow-up this week.  Return precautions and treatment recommendations and follow-up discussed with the patient who is agreeable with the plan.   ____________________________________________   FINAL CLINICAL IMPRESSION(S) / ED DIAGNOSES  Final diagnoses:  Third trimester pregnancy  Iron deficiency anemia, unspecified iron deficiency anemia type  Subacute vaginitis        Note:  This document was prepared using Dragon voice recognition software and may include unintentional dictation errors       Delman Kitten, MD 01/14/21 2024

## 2021-01-14 NOTE — ED Notes (Signed)
Patient comes into with low iron levels-per e-mail from doctor. Patient called doctor for prescription and has not heard back. Patient also c/o possible yeast infection with burning after urination.

## 2021-01-14 NOTE — ED Triage Notes (Signed)
Pt arrives to ER A&O, ambulatory for low iron. Sees [facility], [redacted] weeks pregnant. Called OB floor and stated that pt should be seen in ER. Pt c/o white/clear discharge and some pressure, informed OB floor. Stated still to be seen in ER. Pt unsure what iron level is.

## 2021-01-15 ENCOUNTER — Other Ambulatory Visit: Payer: Self-pay | Admitting: Advanced Practice Midwife

## 2021-01-15 DIAGNOSIS — Z3689 Encounter for other specified antenatal screening: Secondary | ICD-10-CM

## 2021-01-16 ENCOUNTER — Encounter: Payer: Medicaid Other | Admitting: Obstetrics and Gynecology

## 2021-01-16 NOTE — Telephone Encounter (Signed)
LMVM to check my chart or contact or office regarding upcoming appts for u/s MFM.

## 2021-01-20 NOTE — Telephone Encounter (Signed)
Spoke w/patient. Advised of Maternal fetal medicince apt date/times/location.

## 2021-01-21 ENCOUNTER — Other Ambulatory Visit: Payer: Self-pay | Admitting: Advanced Practice Midwife

## 2021-01-21 DIAGNOSIS — O0993 Supervision of high risk pregnancy, unspecified, third trimester: Secondary | ICD-10-CM

## 2021-01-21 DIAGNOSIS — O24919 Unspecified diabetes mellitus in pregnancy, unspecified trimester: Secondary | ICD-10-CM

## 2021-01-21 NOTE — Progress Notes (Signed)
Correct mfm u/s order placed- [place] for growth scan- ordered as +14

## 2021-01-21 NOTE — Telephone Encounter (Signed)
Spoke w/patient earlier to advise needs to keep 4/22 ROB w/Dr. Jerene Pitch. Inquired if patient could drive to [facility]. She can. I called and r/s her 5/10 MFM u/s in [place] to 02/05/21 @9 :30 @[facility]. Patient aware. I will sent my chart msg to notify patient of address/phone #.

## 2021-01-24 ENCOUNTER — Encounter: Payer: Medicaid Other | Admitting: Obstetrics and Gynecology

## 2021-01-26 ENCOUNTER — Encounter: Payer: Self-pay | Admitting: Obstetrics & Gynecology

## 2021-01-26 ENCOUNTER — Other Ambulatory Visit: Payer: Self-pay

## 2021-01-26 ENCOUNTER — Observation Stay
Admission: EM | Admit: 2021-01-26 | Discharge: 2021-01-26 | Disposition: A | Discharge status: Home / Self Care | Payer: Medicaid Other | Attending: Obstetrics & Gynecology | Admitting: Obstetrics & Gynecology

## 2021-01-26 DIAGNOSIS — R1030 Lower abdominal pain, unspecified: Secondary | ICD-10-CM | POA: Diagnosis not present

## 2021-01-26 DIAGNOSIS — Z3A29 29 weeks gestation of pregnancy: Secondary | ICD-10-CM | POA: Insufficient documentation

## 2021-01-26 DIAGNOSIS — O99519 Diseases of the respiratory system complicating pregnancy, unspecified trimester: Secondary | ICD-10-CM

## 2021-01-26 DIAGNOSIS — O99891 Other specified diseases and conditions complicating pregnancy: Secondary | ICD-10-CM | POA: Diagnosis not present

## 2021-01-26 DIAGNOSIS — R1032 Left lower quadrant pain: Secondary | ICD-10-CM | POA: Diagnosis not present

## 2021-01-26 DIAGNOSIS — R1031 Right lower quadrant pain: Secondary | ICD-10-CM | POA: Diagnosis not present

## 2021-01-26 DIAGNOSIS — O0990 Supervision of high risk pregnancy, unspecified, unspecified trimester: Secondary | ICD-10-CM

## 2021-01-26 DIAGNOSIS — Z91040 Latex allergy status: Secondary | ICD-10-CM | POA: Diagnosis not present

## 2021-01-26 DIAGNOSIS — O34219 Maternal care for unspecified type scar from previous cesarean delivery: Secondary | ICD-10-CM

## 2021-01-26 DIAGNOSIS — G40909 Epilepsy, unspecified, not intractable, without status epilepticus: Secondary | ICD-10-CM

## 2021-01-26 DIAGNOSIS — O99352 Diseases of the nervous system complicating pregnancy, second trimester: Secondary | ICD-10-CM

## 2021-01-26 DIAGNOSIS — O26893 Other specified pregnancy related conditions, third trimester: Secondary | ICD-10-CM | POA: Diagnosis not present

## 2021-01-26 DIAGNOSIS — O26899 Other specified pregnancy related conditions, unspecified trimester: Secondary | ICD-10-CM | POA: Diagnosis present

## 2021-01-26 DIAGNOSIS — J45909 Unspecified asthma, uncomplicated: Secondary | ICD-10-CM

## 2021-01-26 DIAGNOSIS — O24919 Unspecified diabetes mellitus in pregnancy, unspecified trimester: Secondary | ICD-10-CM

## 2021-01-26 LAB — URINALYSIS, COMPLETE (UACMP) WITH MICROSCOPIC
Bilirubin Urine: NEGATIVE
Glucose, UA: NEGATIVE mg/dL
Hgb urine dipstick: NEGATIVE
Ketones, ur: NEGATIVE mg/dL
Leukocytes,Ua: NEGATIVE
Nitrite: NEGATIVE
Protein, ur: NEGATIVE mg/dL
Specific Gravity, Urine: 1.02 (ref 1.005–1.030)
pH: 7 (ref 5.0–8.0)

## 2021-01-26 LAB — CHLAMYDIA/NGC RT PCR (ARMC ONLY)
Chlamydia Tr: NOT DETECTED
N gonorrhoeae: NOT DETECTED

## 2021-01-26 LAB — URINE DRUG SCREEN, QUALITATIVE (ARMC ONLY)
Amphetamines, Ur Screen: NOT DETECTED
Barbiturates, Ur Screen: NOT DETECTED
Benzodiazepine, Ur Scrn: NOT DETECTED
Cannabinoid 50 Ng, Ur ~~LOC~~: NOT DETECTED
Cocaine Metabolite,Ur ~~LOC~~: NOT DETECTED
MDMA (Ecstasy)Ur Screen: NOT DETECTED
Methadone Scn, Ur: NOT DETECTED
Opiate, Ur Screen: NOT DETECTED
Phencyclidine (PCP) Ur S: NOT DETECTED
Tricyclic, Ur Screen: NOT DETECTED

## 2021-01-26 LAB — WET PREP, GENITAL
Clue Cells Wet Prep HPF POC: NONE SEEN
Sperm: NONE SEEN
Trich, Wet Prep: NONE SEEN
WBC, Wet Prep HPF POC: NONE SEEN
Yeast Wet Prep HPF POC: NONE SEEN

## 2021-01-26 MED ORDER — ACETAMINOPHEN 325 MG PO TABS: 650.0000 mg | ORAL_TABLET | ORAL | Status: DC | PRN

## 2021-01-26 MED ORDER — NITROFURANTOIN MONOHYD MACRO 100 MG PO CAPS
ORAL_CAPSULE | ORAL | Status: AC
  Administered 2021-01-26: 100 mg via ORAL
  Filled 2021-01-26: qty 1

## 2021-01-26 MED ORDER — ONDANSETRON HCL 4 MG/2ML IJ SOLN: 4.0000 mg | Freq: Four times a day (QID) | INTRAMUSCULAR | Status: DC | PRN

## 2021-01-26 MED ORDER — NITROFURANTOIN MONOHYD MACRO 100 MG PO CAPS: 100.0000 mg | ORAL_CAPSULE | Freq: Two times a day (BID) | ORAL | Status: DC

## 2021-01-26 NOTE — Progress Notes (Signed)
Pt discharged home per Tiburcio Pea, MD order.  Pt stable and ambulatory. An After Visit Summary was printed and given to the patient. Discharge education completed with patient/family including follow up instructions, medication list, d/c activities limitations if indicated, with other d/c instructions as indicated by MD . MD will send antibiotic prescription to pharmacy on file, pt to pick up medicine tomorrow morning.  Pt received labor and bleeding precautions. Patient able to verbalize understanding, all questions fully answered. Patient instructed to return to ED, call 911, or call MD for any changes in condition. Pt In stable condition and d/c home.

## 2021-01-26 NOTE — OB Triage Note (Signed)
Pt Faith [name] 30 y.o. presents to the ED complaining of abdominal pressure, frequent urination, and white thick discharge. Pt is a [id number] at [redacted]w[redacted]d with 2 previous c/s. Pt denies signs and symptons consistent with rupture of membranes or active vaginal bleeding. Pt reports "watery diarrhea all night" but was unable to give the number of bowel movements. Pt reports nausea but is able to keep her cereal down this afternoon.  Pt denies contractions and states positive fetal movement. Pt has had ongoing pelvic pressure for the past few weeks for which she was seen at the [facility] office. Pt states she has noticied the increased urination since last night and states, "I get wet when I feel pressure." Pt reports upper abdominal pain that wraps around to the back bilateraly rating pain 8/10. Pt reports unprotected sex last night.  External FM and TOCO applied to non-tender abdomen and assessing. Initial FHR 140 . Vital signs obtained and within normal limits. Tiburcio Pea, MD notified of pt, MD gave verbal orders for UA, UDS, wet prep, and G/C.

## 2021-01-27 ENCOUNTER — Other Ambulatory Visit: Payer: Self-pay | Admitting: Obstetrics and Gynecology

## 2021-01-27 ENCOUNTER — Telehealth: Payer: Self-pay

## 2021-01-27 ENCOUNTER — Encounter: Payer: Medicaid Other | Admitting: Obstetrics

## 2021-01-27 LAB — GLUCOSE, CAPILLARY: Glucose-Capillary: 127 mg/dL — ABNORMAL HIGH (ref 70–99)

## 2021-01-27 MED ORDER — NITROFURANTOIN MONOHYD MACRO 100 MG PO CAPS: 100.0000 mg | ORAL_CAPSULE | Freq: Two times a day (BID) | ORAL | 0 refills | Status: DC

## 2021-01-27 NOTE — Final Progress Note (Signed)
Physician Final Progress Note  Patient ID: Faith [name] MRN: [id number] DOB/AGE: [birth year] 30 y.o.  Admit date: 01/26/2021 Admitting provider: Gae Dry, MD Discharge date: 01/27/2021  Admission Diagnoses: Active Problems:   Indication for care in labor and delivery, antepartum   Pregnancy related bilateral lower abdominal pain, antepartum  Discharge Diagnoses:  Active Problems:   Indication for care in labor and delivery, antepartum   Pregnancy related bilateral lower abdominal pain, antepartum   Consults: None  Significant Findings/ Diagnostic Studies: Patient presented for evaluation of labor.  Patient had cervical exam by RN and this was reported to me. I reviewed her vital signs and fetal tracing, both of which were reassuring.  Patient was discharge as she was not laboring.  Procedures: A NST procedure was performed with FHR monitoring and a normal baseline established, appropriate time of 20-40 minutes of evaluation, and accels >2 seen w 15x15 characteristics.  Results show a REACTIVE NST for prematurity  Discharge Condition: good  Disposition: Discharge disposition: 01-Home or Self Care       Diet: Regular diet  Discharge Activity: Activity as tolerated   Allergies as of 01/26/2021      Reactions   Ivp Dye [iodinated Diagnostic Agents] Anaphylaxis   Per patient, she does not have problems with betadine   Latex Rash   Metrizamide Anaphylaxis   Morphine Other (See Comments)   bradycradia   Mushroom Extract Complex Anaphylaxis   Hives then swelling of throat and can not breath   Sulfasalazine Hives   Tomato Anaphylaxis   Toradol [ketorolac Tromethamine] Anaphylaxis, Hives, Swelling   Aspirin Hives   Dilaudid [hydromorphone Hcl] Hives   Lamictal [lamotrigine] Hives   Sulfa Antibiotics Hives   Tramadol Swelling, Rash, Hives   Adhesive [tape] Itching   USE PAPER TAPE   Iohexol Rash   (contrast dye) causes red bumps      Medication List    ASK  your doctor about these medications   Accu-Chek Nano SmartView w/Device Kit 1 kit by Subdermal route as directed. Check blood sugars for fasting, and two hours after breakfast, lunch and dinner (4 checks daily)   Accu-Chek SmartView test strip Generic drug: glucose blood Use as instructed to check blood sugars   Accu-Chek Softclix Lancets lancets 1 each by Other route 4 (four) times daily.   alprazolam 2 MG tablet Commonly known as: XANAX Take by mouth.   ferrous sulfate 325 (65 FE) MG tablet Take 1 tablet (325 mg total) by mouth daily.   PARoxetine 20 MG tablet Commonly known as: PAXIL Take by mouth.   terconazole 0.4 % vaginal cream Commonly known as: Terazol 7 Place 1 applicator vaginally at bedtime. Apply once daily for 7 days       Total time spent taking care of this patient: TRIAGE  Signed: Hoyt Koch 01/27/2021, 4:55 AM

## 2021-01-27 NOTE — Telephone Encounter (Signed)
Pt called at 2:52 and stated that she was in L&D last night and was told she has a UTI. Pt states she needs her medication called in to her Pharmacy please.

## 2021-01-27 NOTE — Discharge Summary (Signed)
  See FPN 

## 2021-01-28 ENCOUNTER — Other Ambulatory Visit: Payer: Self-pay | Admitting: Obstetrics & Gynecology

## 2021-01-28 NOTE — Telephone Encounter (Signed)
Done by Dr Bonney Aid

## 2021-01-29 NOTE — Telephone Encounter (Signed)
She was seen Sunday by you Renae Fickle and told she was supposed to pick up a medication starting with M for a UTI.  She called Monday saying it wasn't at the pharmacy and I called in macrobid on Monday 4/25 since I assumed that is what you wanted to start.   It is showing in her medlist was sent to:  [id number] [place], [place] - [address] AT [facility]  [address] [place], [place] [address]

## 2021-01-31 ENCOUNTER — Other Ambulatory Visit: Payer: Self-pay

## 2021-01-31 ENCOUNTER — Ambulatory Visit (INDEPENDENT_AMBULATORY_CARE_PROVIDER_SITE_OTHER): Payer: Medicaid Other | Admitting: Obstetrics and Gynecology

## 2021-01-31 VITALS — BP 122/72 | Wt 222.0 lb

## 2021-01-31 DIAGNOSIS — Z3A30 30 weeks gestation of pregnancy: Secondary | ICD-10-CM

## 2021-01-31 DIAGNOSIS — O0990 Supervision of high risk pregnancy, unspecified, unspecified trimester: Secondary | ICD-10-CM

## 2021-01-31 DIAGNOSIS — O24919 Unspecified diabetes mellitus in pregnancy, unspecified trimester: Secondary | ICD-10-CM

## 2021-01-31 DIAGNOSIS — O34219 Maternal care for unspecified type scar from previous cesarean delivery: Secondary | ICD-10-CM

## 2021-01-31 LAB — POCT URINALYSIS DIPSTICK OB
Glucose, UA: NEGATIVE
POC,PROTEIN,UA: NEGATIVE

## 2021-01-31 NOTE — Progress Notes (Signed)
Routine Prenatal Care Visit  Subjective  Faith [name] is a 30 y.o. [id number] at [redacted]w[redacted]d being seen today for ongoing prenatal care.  She is currently monitored for the following issues for this high-risk pregnancy and has Status epilepticus (HCC); Diabetes mellitus type 2 in obese (HCC); Depression; Suicidal ideation; Post traumatic stress disorder (PTSD); ADD (attention deficit disorder); History of anemia; History of maternal blood transfusion, currently pregnant; Asthma affecting pregnancy, antepartum; Smoking (tobacco) complicating pregnancy, unspecified trimester; Seizure disorder during pregnancy, third trimester (HCC); Family history of Huntington's disease; History of cesarean delivery; Bipolar disorder (HCC); History of cesarean delivery, currently pregnant; Hidradenitis suppurativa; Supervision of high risk pregnancy, antepartum; Diabetes mellitus affecting pregnancy, unspecified trimester; and Pregnancy related bilateral lower abdominal pain, antepartum on their problem list.  ----------------------------------------------------------------------------------- Patient reports no complaints.   Contractions: Not present. Vag. Bleeding: None.  Movement: Present. Denies leaking of fluid.  ----------------------------------------------------------------------------------- The following portions of the patient's history were reviewed and updated as appropriate: allergies, current medications, past family history, past medical history, past social history, past surgical history and problem list. Problem list updated.   Objective  Blood pressure 122/72, weight 222 lb (100.7 kg). Pregravid weight 199 lb (90.3 kg) Total Weight Gain 23 lb (10.4 kg) Urinalysis:      Fetal Status: Fetal Heart Rate (bpm): 135 Fundal Height: 32 cm Movement: Present     General:  Alert, oriented and cooperative. Patient is in no acute distress.  Skin: Skin is warm and dry. No rash noted.   Cardiovascular: Normal  heart rate noted  Respiratory: Normal respiratory effort, no problems with respiration noted  Abdomen: Soft, gravid, appropriate for gestational age. Pain/Pressure: Absent     Pelvic:  Cervical exam deferred        Extremities: Normal range of motion.     ental Status: Normal mood and affect. Normal behavior. Normal judgment and thought content.     Assessment   30 y.o. [id number] at [redacted]w[redacted]d by  04/11/2021, by Ultrasound presenting for routine prenatal visit  Plan   PREGNANCY 7 Problems (from 08/28/20 to present)    Problem Noted Resolved   Diabetes mellitus affecting pregnancy, unspecified trimester 09/19/2020 by Conard Novak, MD No   Supervision of high risk pregnancy, antepartum 09/04/2020 by Mirna Mires, CNM No   Overview Addendum 01/31/2021  2:56 PM by Zipporah Plants, CNM     Nursing Staff Provider  Office Location  [facility] Dating   7wk [place]  Language  English Anatomy US   complete, normal, XY  Flu Vaccine   declined Genetic Screen  NIPS: normal xy  TDaP vaccine    Hgb A1C or  GTT  Type 2 DM  Covid    LAB RESULTS   Rhogam   not needed Blood Type O/Positive/-- (12/17 1028)   Feeding Plan  Antibody Negative (12/17 1028)  Contraception  unsure- options reviewed 01/31/21 Rubella 1.76 (12/17 1028)  Circumcision  RPR Non Reactive (12/17 1028)   Pediatrician   HBsAg Negative (12/17 1028)   Support Person  HIV Non Reactive (12/17 1028)  Prenatal Classes  Varicella     GBS  (For PCN allergy, check sensitivities)   BTL Consent  n/a    VBAC Consent  hx of 2  LTCS Pap  08/28/20 - NILM    Hgb Electro      CF       SMA         High risk pregnancy diagnoses:  Type 2  diabetes in pregnancy - was taking metformin but discontinued secondary to hypoglycemia Obesity in pregnancy: pregravid BMI 36 History of 2 prior cesarean deliveries Tobacco use during pregnancy Seizure disorder Asthma      Previous Version   History of cesarean delivery, currently pregnant 10/12/2017 by Vena Austria, MD No   Overview Signed 09/19/2020  2:12 PM by Conard Novak, MD    Arrest of dilation at 7cm after 3 day IOL Baby 8#7oz Blood transfusion postpartum      Asthma affecting pregnancy, antepartum 05/10/2017 by Lady Deutscher, MD No   Seizure disorder during pregnancy, third trimester (HCC) 05/10/2017 by Lady Deutscher, MD No      -Patient reports BG values have been fasting: 70s, post-prandial: 120s (occasionally 140s) - she reports her BG today was 166 -Reviewed importance of bringing BG log to appointments for evaluation -Patient schedule with MFM next with for MD visit and [place] -APT testing to start at 32 weeks per ACOG recommendations d/t dx of pregestational diabetes -Will see MD at next visit to discuss timing of delivery and to schedule R CS -Reviewed contraceptive options with patient   Gestational age appropriate obstetric precautions including but not limited to vaginal bleeding, contractions, leaking of fluid and fetal movement were reviewed in detail with the patient.    Return in about 2 weeks (around 02/14/2021) for HROB/NST with Dr Kathaleen Bury c/s].  Zipporah Plants, CNM, MSN [facility], [facility] 01/31/2021, 2:57 PM

## 2021-02-05 ENCOUNTER — Other Ambulatory Visit: Payer: Self-pay | Admitting: *Deleted

## 2021-02-05 ENCOUNTER — Encounter: Payer: Self-pay | Admitting: *Deleted

## 2021-02-05 ENCOUNTER — Other Ambulatory Visit: Payer: Self-pay

## 2021-02-05 ENCOUNTER — Other Ambulatory Visit: Payer: Self-pay | Admitting: Advanced Practice Midwife

## 2021-02-05 ENCOUNTER — Ambulatory Visit: Payer: Medicaid Other | Admitting: *Deleted

## 2021-02-05 ENCOUNTER — Ambulatory Visit (HOSPITAL_BASED_OUTPATIENT_CLINIC_OR_DEPARTMENT_OTHER): Payer: Medicaid Other | Admitting: Obstetrics and Gynecology

## 2021-02-05 ENCOUNTER — Ambulatory Visit: Payer: Medicaid Other | Attending: Advanced Practice Midwife

## 2021-02-05 DIAGNOSIS — O0993 Supervision of high risk pregnancy, unspecified, third trimester: Secondary | ICD-10-CM | POA: Diagnosis not present

## 2021-02-05 DIAGNOSIS — G40909 Epilepsy, unspecified, not intractable, without status epilepticus: Secondary | ICD-10-CM | POA: Insufficient documentation

## 2021-02-05 DIAGNOSIS — O24113 Pre-existing diabetes mellitus, type 2, in pregnancy, third trimester: Secondary | ICD-10-CM

## 2021-02-05 DIAGNOSIS — O34219 Maternal care for unspecified type scar from previous cesarean delivery: Secondary | ICD-10-CM | POA: Insufficient documentation

## 2021-02-05 DIAGNOSIS — J45909 Unspecified asthma, uncomplicated: Secondary | ICD-10-CM

## 2021-02-05 DIAGNOSIS — O99353 Diseases of the nervous system complicating pregnancy, third trimester: Secondary | ICD-10-CM

## 2021-02-05 DIAGNOSIS — Z3A30 30 weeks gestation of pregnancy: Secondary | ICD-10-CM | POA: Diagnosis not present

## 2021-02-05 DIAGNOSIS — O24913 Unspecified diabetes mellitus in pregnancy, third trimester: Secondary | ICD-10-CM

## 2021-02-05 DIAGNOSIS — O99330 Smoking (tobacco) complicating pregnancy, unspecified trimester: Secondary | ICD-10-CM

## 2021-02-05 DIAGNOSIS — O0990 Supervision of high risk pregnancy, unspecified, unspecified trimester: Secondary | ICD-10-CM | POA: Insufficient documentation

## 2021-02-05 DIAGNOSIS — O24919 Unspecified diabetes mellitus in pregnancy, unspecified trimester: Secondary | ICD-10-CM | POA: Diagnosis present

## 2021-02-05 DIAGNOSIS — O99519 Diseases of the respiratory system complicating pregnancy, unspecified trimester: Secondary | ICD-10-CM | POA: Insufficient documentation

## 2021-02-05 NOTE — Progress Notes (Signed)
Maternal-Fetal Medicine   Name: Faith [name] DOB: [birth year] MRN: [id number] Referring Provider: Tresea Mall, CNM  I had the pleasure of seeing [name] at [facility]. She is a G6 [id number] at 70 w gestation and is here for ultrasound and consultation to discuss pregnancy management.    She has type 2 diabetes.   Patient reports she has type 2 diabetes since her last delivery.  She checks her blood glucose 4 times daily and reports her fasting levels are between 70 and 80 mg/DL and postprandial levels are between 120 and 130 mg/DL.  Hemoglobin A1c performed in December 2021 was 5.1%.  Her blood pressures have been normal at prenatal visits.  On cell free fetal DNA screening, the risks of fetal aneuploidies are not increased. Past medical history is also significant for seizure disorder.  She has not had seizures for about 3 years and is not taking anticonvulsants.  Other medical history includes migraines, asthma and bipolar disorder. Past surgical history: Cesarean sections x 2.  Medications: Prenatal vitamins Allergies to multiple medications including morphine, sulfa drugs, aspirin, Lamictal, tramadol. Social history: Patient reports she smokes 5 to 6 cigarettes daily.  No alcohol or drug use.  Her partner is an African-American and he is good health.  He is now the father of her first 2 children. Obstetric history 10/2017: Term cesarean delivery of a female infant weighing 9 pounds 10 ounces at birth. 06/2016: Term cesarean delivery of a female infant weighing 8 pounds 7 ounces at birth. She also had 3 early spontaneous miscarriages Prenatal course: On cell free fetal DNA screening, the risks of fetal aneuploidies are not increased. On today's ultrasound, amniotic fluid is normal and good fetal activity seen.  The estimated fetal weight is at the 99th percentile.  Abdominal circumference measurement is at the 99th percentile.  Fetal anatomical survey appears  normal but limited by advanced gestational age. Ultrasound has limitations in accurately estimating fetal weights. Placenta is posterior and there is no evidence of previa or placenta accreta spectrum.  Our concerns include: Type 2 diabetes -Patient reports she checks her blood glucose regularly and that they are within normal range. She ha discontinued metformin. She does not experience hypoglycemic symptoms. -Management of diabetes include diet, exercise, and medications (oral hypoglycmics or insuin). -I discussed hypoglycemia and its corrective measures. -Fetal macrosomia detected on ultrasound may or may not be related to the control of diabetes. Patient reports her blood glucose levels are within normal range. Both her children weighed over 8 lbs at birth. -I discussed our ultrasound protocol of weekly antenatal testing from 32 weeks' gestation till delivery. -I discussed timing of delivery. If diabetes is well controlled, repeat cesarean delivery may be performed at 39 weeks. However, if diabetes is not well controlled, early term delivery at 89 or 29 weeks' gestation is appropriate.  Previous cesarean deliveries -I reassured the patient of normal placental location (no previa or PAS). Cesarean deliveries increase the risk of previa and/or PAS. Patient would like have another child and is aware of the potential risks now.  Seizure disorder -She has not had any seizure episodes for about 3 years and the patient is not taking anticonvulsants. We should expect good pregnancy outcome.  Recommendations -Appointments were made for her to return in 2 weeks for BPP and then weekly till delivery. -Fetal growth assessment in 4 weeks. -Delivery at 39 weeks' gestation provided diabetes is well controlled. -If diabetes is not well controlled, early term  delivery (37 or 38 weeks' gestation) should be considered.  Thank you for consultation.  If you have any questions, please contact me at the [facility].  Consultation including face-to-face counseling 45 minutes.

## 2021-02-11 ENCOUNTER — Ambulatory Visit: Payer: Medicaid Other

## 2021-02-14 ENCOUNTER — Other Ambulatory Visit: Payer: Self-pay

## 2021-02-14 ENCOUNTER — Ambulatory Visit (INDEPENDENT_AMBULATORY_CARE_PROVIDER_SITE_OTHER): Payer: Medicaid Other | Admitting: Obstetrics and Gynecology

## 2021-02-14 NOTE — Progress Notes (Signed)
Routine Prenatal Care Visit  Subjective  Faith [name] is a 30 y.o. F7T0240 at [redacted]w[redacted]d being seen today for ongoing prenatal care.  She is currently monitored for the following issues for this high-risk pregnancy and has Status epilepticus (HCC); Diabetes mellitus type 2 in obese (HCC); Depression; Suicidal ideation; Post traumatic stress disorder (PTSD); ADD (attention deficit disorder); History of anemia; History of maternal blood transfusion, currently pregnant; Asthma affecting pregnancy, antepartum; Smoking (tobacco) complicating pregnancy, unspecified trimester; Seizure disorder during pregnancy, third trimester (HCC); Family history of Huntington's disease; History of cesarean delivery; Bipolar disorder (HCC); History of cesarean delivery, currently pregnant; Hidradenitis suppurativa; Supervision of high risk pregnancy, antepartum; Diabetes mellitus affecting pregnancy, unspecified trimester; and Pregnancy related bilateral lower abdominal pain, antepartum on their problem list.  ----------------------------------------------------------------------------------- Patient reports no complaints.   Contractions: Irregular. Vag. Bleeding: None.  Movement: Present. Denies leaking of fluid.  ----------------------------------------------------------------------------------- The following portions of the patient's history were reviewed and updated as appropriate: allergies, current medications, past family history, past medical history, past social history, past surgical history and problem list. Problem list updated.   Objective  Blood pressure 120/70, weight 225 lb (102.1 kg). Pregravid weight 199 lb (90.3 kg) Total Weight Gain 26 lb (11.8 kg) Urinalysis:      Fetal Status: Fetal Heart Rate (bpm): 150 Fundal Height: 34 cm Movement: Present  Presentation: Vertex  General:  Alert, oriented and cooperative. Patient is in no acute distress.  Skin: Skin is warm and dry. No rash noted.    Cardiovascular: Normal heart rate noted  Respiratory: Normal respiratory effort, no problems with respiration noted  Abdomen: Soft, gravid, appropriate for gestational age. Pain/Pressure: Absent     Pelvic:  Cervical exam deferred        Extremities: Normal range of motion.     ental Status: Normal mood and affect. Normal behavior. Normal judgment and thought content.     Assessment   30 y.o. [id number] at [redacted]w[redacted]d by  04/11/2021, by Ultrasound presenting for routine prenatal visit  Plan   PREGNANCY 7 Problems (from 08/28/20 to present)    Problem Noted Resolved   Diabetes mellitus affecting pregnancy, unspecified trimester 09/19/2020 by Conard Novak, MD No   Supervision of high risk pregnancy, antepartum 09/04/2020 by Mirna Mires, CNM No   Overview Addendum 01/31/2021  2:56 PM by Zipporah Plants, CNM     Nursing Staff Provider  Office Location  [facility] Dating   7wk [place]  Language  English Anatomy US   complete, normal, XY  Flu Vaccine   declined Genetic Screen  NIPS: normal xy  TDaP vaccine    Hgb A1C or  GTT  Type 2 DM  Covid    LAB RESULTS   Rhogam   not needed Blood Type O/Positive/-- (12/17 1028)   Feeding Plan  Antibody Negative (12/17 1028)  Contraception  unsure- options reviewed 01/31/21 Rubella 1.76 (12/17 1028)  Circumcision  RPR Non Reactive (12/17 1028)   Pediatrician   HBsAg Negative (12/17 1028)   Support Person  HIV Non Reactive (12/17 1028)  Prenatal Classes  Varicella     GBS  (For PCN allergy, check sensitivities)   BTL Consent  n/a    VBAC Consent  hx of 2  LTCS Pap  08/28/20 - NILM    Hgb Electro      CF       SMA         High risk pregnancy diagnoses:  Type 2 diabetes  in pregnancy - was taking metformin but discontinued secondary to hypoglycemia Obesity in pregnancy: pregravid BMI 36 History of 2 prior cesarean deliveries Tobacco use during pregnancy Seizure disorder Asthma      Previous Version   History of cesarean delivery, currently  pregnant 10/12/2017 by Vena Austria, MD No   Overview Signed 09/19/2020  2:12 PM by Conard Novak, MD    Arrest of dilation at 7cm after 3 day IOL Baby 8#7oz Blood transfusion postpartum      Asthma affecting pregnancy, antepartum 05/10/2017 by Lady Deutscher, MD No   Seizure disorder during pregnancy, third trimester (HCC) 05/10/2017 by Lady Deutscher, MD No       Gestational age appropriate obstetric precautions including but not limited to vaginal bleeding, contractions, leaking of fluid and fetal movement were reviewed in detail with the patient.    1) DM2 - continue metformin - no BG log but reports lower fasting below 95 with postprandials still in 110-130 range - recent growth scan consistent with macrosomia - Start APT twice weekly next week (NST with [place], BPP with MFM 02/20/21)  2) History of cesarean section - repeat scheduled 6/30 [redacted]w[redacted]d however depending on BG control she is aware this may be moved up to 37 weeks  Return in about 3 days (around 02/17/2021) for ROB, NST MD.  Vena Austria, MD, [facility], [facility] 02/14/2021, 3:03 PM

## 2021-02-17 ENCOUNTER — Ambulatory Visit (INDEPENDENT_AMBULATORY_CARE_PROVIDER_SITE_OTHER): Payer: Medicaid Other | Admitting: Advanced Practice Midwife

## 2021-02-17 ENCOUNTER — Other Ambulatory Visit: Payer: Self-pay

## 2021-02-17 ENCOUNTER — Encounter: Payer: Self-pay | Admitting: Advanced Practice Midwife

## 2021-02-17 VITALS — BP 120/74 | Wt 225.0 lb

## 2021-02-17 DIAGNOSIS — O99213 Obesity complicating pregnancy, third trimester: Secondary | ICD-10-CM | POA: Diagnosis not present

## 2021-02-17 DIAGNOSIS — Z3A32 32 weeks gestation of pregnancy: Secondary | ICD-10-CM

## 2021-02-17 DIAGNOSIS — O0993 Supervision of high risk pregnancy, unspecified, third trimester: Secondary | ICD-10-CM

## 2021-02-17 DIAGNOSIS — O24919 Unspecified diabetes mellitus in pregnancy, unspecified trimester: Secondary | ICD-10-CM | POA: Diagnosis not present

## 2021-02-17 LAB — POCT URINALYSIS DIPSTICK OB
Glucose, UA: NEGATIVE
POC,PROTEIN,UA: NEGATIVE

## 2021-02-17 LAB — FETAL NONSTRESS TEST

## 2021-02-17 NOTE — Progress Notes (Signed)
Routine Prenatal Care Visit  Subjective  Faith [name] is a 30 y.o. [id number] at [redacted]w[redacted]d being seen today for ongoing prenatal care.  She is currently monitored for the following issues for this high-risk pregnancy and has Status epilepticus (HCC); Diabetes mellitus type 2 in obese (HCC); Depression; Suicidal ideation; Post traumatic stress disorder (PTSD); ADD (attention deficit disorder); History of anemia; History of maternal blood transfusion, currently pregnant; Asthma affecting pregnancy, antepartum; Smoking (tobacco) complicating pregnancy, unspecified trimester; Seizure disorder during pregnancy, third trimester (HCC); Family history of Huntington's disease; History of cesarean delivery; Bipolar disorder (HCC); History of cesarean delivery, currently pregnant; Hidradenitis suppurativa; Supervision of high risk pregnancy, antepartum; Diabetes mellitus affecting pregnancy, unspecified trimester; Pregnancy related bilateral lower abdominal pain, antepartum; and Obesity affecting pregnancy in third trimester on their problem list.  ----------------------------------------------------------------------------------- Patient reports some cramping over the weekend.   Contractions: Not present. Vag. Bleeding: None.  Movement: Present. Leaking Fluid denies.  ----------------------------------------------------------------------------------- The following portions of the patient's history were reviewed and updated as appropriate: allergies, current medications, past family history, past medical history, past social history, past surgical history and problem list. Problem list updated.  Objective  Blood pressure 120/74, weight 225 lb (102.1 kg). Pregravid weight 199 lb (90.3 kg) Total Weight Gain 26 lb (11.8 kg) Urinalysis: Urine Protein Negative  Urine Glucose Negative  Fetal Status: Fetal Heart Rate (bpm): 130   Movement: Present      NST: reactive appropriate for gestational age 3 minute tracing,  130 bpm, moderate variability, +accelerations, -decelerations  BS log: did not bring log, she reports fasting are 60-70, after breakfast 80, after lunch 130, after dinner 120/130  General:  Alert, oriented and cooperative. Patient is in no acute distress.  Skin: Skin is warm and dry. No rash noted.   Cardiovascular: Normal heart rate noted  Respiratory: Normal respiratory effort, no problems with respiration noted  Abdomen: Soft, gravid, appropriate for gestational age. Pain/Pressure: Absent     Pelvic:  Cervical exam deferred        Extremities: Normal range of motion.  Edema: None  Mental Status: Normal mood and affect. Normal behavior. Normal judgment and thought content.   Assessment   30 y.o. [id number] at [redacted]w[redacted]d by  04/11/2021, by Ultrasound presenting for routine prenatal visit  Plan   PREGNANCY 7 Problems (from 08/28/20 to present)    Problem Noted Resolved   Obesity affecting pregnancy in third trimester 02/17/2021 by Tresea Mall, CNM No   Diabetes mellitus affecting pregnancy, unspecified trimester 09/19/2020 by Conard Novak, MD No   Supervision of high risk pregnancy, antepartum 09/04/2020 by Mirna Mires, CNM No   Overview Addendum 01/31/2021  2:56 PM by Zipporah Plants, CNM     Nursing Staff Provider  Office Location  [facility] Dating   7wk [place]  Language  English Anatomy US   complete, normal, XY  Flu Vaccine   declined Genetic Screen  NIPS: normal xy  TDaP vaccine    Hgb A1C or  GTT  Type 2 DM  Covid    LAB RESULTS   Rhogam   not needed Blood Type O/Positive/-- (12/17 1028)   Feeding Plan  Antibody Negative (12/17 1028)  Contraception  unsure- options reviewed 01/31/21 Rubella 1.76 (12/17 1028)  Circumcision  RPR Non Reactive (12/17 1028)   Pediatrician   HBsAg Negative (12/17 1028)   Support Person  HIV Non Reactive (12/17 1028)  Prenatal Classes  Varicella     GBS  (For PCN allergy, check  sensitivities)   BTL Consent  n/a    VBAC Consent  hx of 2  LTCS Pap   08/28/20 - NILM    Hgb Electro      CF       SMA         High risk pregnancy diagnoses:  Type 2 diabetes in pregnancy - was taking metformin but discontinued secondary to hypoglycemia Obesity in pregnancy: pregravid BMI 36 History of 2 prior cesarean deliveries Tobacco use during pregnancy Seizure disorder Asthma      Previous Version   History of cesarean delivery, currently pregnant 10/12/2017 by Vena Austria, MD No   Overview Signed 09/19/2020  2:12 PM by Conard Novak, MD    Arrest of dilation at 7cm after 3 day IOL Baby 8#7oz Blood transfusion postpartum      Asthma affecting pregnancy, antepartum 05/10/2017 by Lady Deutscher, MD No   Seizure disorder during pregnancy, third trimester (HCC) 05/10/2017 by Lady Deutscher, MD No       Preterm labor symptoms and general obstetric precautions including but not limited to vaginal bleeding, contractions, leaking of fluid and fetal movement were reviewed in detail with the patient. Please refer to After Visit Summary for other counseling recommendations.   Return in about 1 week (around 02/24/2021) for NST/ROB with MD.  Tresea Mall, CNM 02/17/2021 2:53 PM

## 2021-02-18 ENCOUNTER — Encounter: Payer: Self-pay | Admitting: Obstetrics & Gynecology

## 2021-02-18 ENCOUNTER — Encounter: Payer: Medicaid Other | Admitting: Obstetrics and Gynecology

## 2021-02-18 ENCOUNTER — Other Ambulatory Visit: Payer: Self-pay

## 2021-02-18 ENCOUNTER — Observation Stay
Admission: EM | Admit: 2021-02-18 | Discharge: 2021-02-19 | Disposition: A | Discharge status: Home / Self Care | Payer: Medicaid Other | Attending: Obstetrics & Gynecology | Admitting: Obstetrics & Gynecology

## 2021-02-18 DIAGNOSIS — O34219 Maternal care for unspecified type scar from previous cesarean delivery: Secondary | ICD-10-CM

## 2021-02-18 DIAGNOSIS — O99213 Obesity complicating pregnancy, third trimester: Secondary | ICD-10-CM | POA: Insufficient documentation

## 2021-02-18 DIAGNOSIS — O99353 Diseases of the nervous system complicating pregnancy, third trimester: Secondary | ICD-10-CM

## 2021-02-18 DIAGNOSIS — O26893 Other specified pregnancy related conditions, third trimester: Principal | ICD-10-CM | POA: Insufficient documentation

## 2021-02-18 DIAGNOSIS — J45909 Unspecified asthma, uncomplicated: Secondary | ICD-10-CM

## 2021-02-18 DIAGNOSIS — O26899 Other specified pregnancy related conditions, unspecified trimester: Secondary | ICD-10-CM | POA: Diagnosis present

## 2021-02-18 DIAGNOSIS — B9689 Other specified bacterial agents as the cause of diseases classified elsewhere: Secondary | ICD-10-CM | POA: Insufficient documentation

## 2021-02-18 DIAGNOSIS — O24919 Unspecified diabetes mellitus in pregnancy, unspecified trimester: Secondary | ICD-10-CM

## 2021-02-18 DIAGNOSIS — Z91040 Latex allergy status: Secondary | ICD-10-CM | POA: Insufficient documentation

## 2021-02-18 DIAGNOSIS — Z3A32 32 weeks gestation of pregnancy: Secondary | ICD-10-CM | POA: Insufficient documentation

## 2021-02-18 DIAGNOSIS — O2343 Unspecified infection of urinary tract in pregnancy, third trimester: Secondary | ICD-10-CM | POA: Insufficient documentation

## 2021-02-18 DIAGNOSIS — G40909 Epilepsy, unspecified, not intractable, without status epilepticus: Secondary | ICD-10-CM

## 2021-02-18 DIAGNOSIS — R1030 Lower abdominal pain, unspecified: Secondary | ICD-10-CM | POA: Insufficient documentation

## 2021-02-18 DIAGNOSIS — O0990 Supervision of high risk pregnancy, unspecified, unspecified trimester: Secondary | ICD-10-CM

## 2021-02-18 DIAGNOSIS — E669 Obesity, unspecified: Secondary | ICD-10-CM | POA: Insufficient documentation

## 2021-02-18 NOTE — OB Triage Note (Signed)
Pt Faith [name] 30 y.o. presents to the ED complaining of contractions and cramping. Rates contraction pain as 9/10. Pt is a K3T2481 at [redacted]w[redacted]d . Pt denies signs and symptoms consistent with rupture of membranes or active vaginal bleeding. Pt denies contractions and states positive fetal movement. External FM and TOCO applied to non-tender abdomen and assessing. Initial FHR 140. Vital signs obtained and diastolic pressure is 57. Otherwise, vital signs are within normal limits. Provider notified of pt.

## 2021-02-19 ENCOUNTER — Other Ambulatory Visit: Payer: Self-pay | Admitting: Obstetrics & Gynecology

## 2021-02-19 DIAGNOSIS — Z3A32 32 weeks gestation of pregnancy: Secondary | ICD-10-CM

## 2021-02-19 DIAGNOSIS — R1030 Lower abdominal pain, unspecified: Secondary | ICD-10-CM | POA: Diagnosis not present

## 2021-02-19 DIAGNOSIS — Z91040 Latex allergy status: Secondary | ICD-10-CM | POA: Diagnosis not present

## 2021-02-19 DIAGNOSIS — O26893 Other specified pregnancy related conditions, third trimester: Secondary | ICD-10-CM | POA: Diagnosis not present

## 2021-02-19 DIAGNOSIS — O99213 Obesity complicating pregnancy, third trimester: Secondary | ICD-10-CM | POA: Diagnosis not present

## 2021-02-19 DIAGNOSIS — N390 Urinary tract infection, site not specified: Secondary | ICD-10-CM | POA: Diagnosis not present

## 2021-02-19 DIAGNOSIS — O2343 Unspecified infection of urinary tract in pregnancy, third trimester: Secondary | ICD-10-CM | POA: Diagnosis not present

## 2021-02-19 DIAGNOSIS — E669 Obesity, unspecified: Secondary | ICD-10-CM | POA: Diagnosis not present

## 2021-02-19 DIAGNOSIS — B9689 Other specified bacterial agents as the cause of diseases classified elsewhere: Secondary | ICD-10-CM | POA: Diagnosis not present

## 2021-02-19 LAB — URINALYSIS, ROUTINE W REFLEX MICROSCOPIC
Bilirubin Urine: NEGATIVE
Glucose, UA: NEGATIVE mg/dL
Hgb urine dipstick: NEGATIVE
Ketones, ur: NEGATIVE mg/dL
Nitrite: NEGATIVE
Protein, ur: 30 mg/dL — AB
Specific Gravity, Urine: 1.02 (ref 1.005–1.030)
pH: 6 (ref 5.0–8.0)

## 2021-02-19 LAB — FETAL FIBRONECTIN: Fetal Fibronectin: NEGATIVE

## 2021-02-19 MED ORDER — ACETAMINOPHEN 325 MG PO TABS: 650.0000 mg | ORAL_TABLET | ORAL | Status: DC | PRN

## 2021-02-19 MED ORDER — NITROFURANTOIN MONOHYD MACRO 100 MG PO CAPS
100.0000 mg | ORAL_CAPSULE | Freq: Two times a day (BID) | ORAL | Status: DC
  Administered 2021-02-19: 100 mg via ORAL
  Filled 2021-02-19: qty 1

## 2021-02-19 MED ORDER — CEPHALEXIN 500 MG PO CAPS: 500.0000 mg | ORAL_CAPSULE | Freq: Four times a day (QID) | ORAL | 2 refills | Status: DC

## 2021-02-19 MED ORDER — LIDOCAINE HCL (PF) 1 % IJ SOLN: 30.0000 mL | INTRAMUSCULAR | Status: DC | PRN

## 2021-02-19 MED ORDER — CEPHALEXIN 250 MG/5ML PO SUSR
500.0000 mg | Freq: Two times a day (BID) | ORAL | Status: DC
  Administered 2021-02-19: 500 mg via ORAL
  Filled 2021-02-19 (×2): qty 10

## 2021-02-19 MED ORDER — ONDANSETRON HCL 4 MG/2ML IJ SOLN: 4.0000 mg | Freq: Four times a day (QID) | INTRAMUSCULAR | Status: DC | PRN

## 2021-02-19 NOTE — H&P (Signed)
Obstetrics Admission History & Physical   Labor Eval   HPI:  30 y.o. [id number] @ [redacted]w[redacted]d(04/11/2021, by Ultrasound). Admitted on 02/18/2021:   Patient Active Problem List   Diagnosis Date Noted  . Obesity affecting pregnancy in third trimester 02/17/2021  . Pregnancy related bilateral lower abdominal pain, antepartum 01/26/2021  . Diabetes mellitus affecting pregnancy, unspecified trimester 09/19/2020  . Supervision of high risk pregnancy, antepartum 09/04/2020  . Hidradenitis suppurativa 05/24/2018  . History of cesarean delivery, currently pregnant 10/12/2017  . Bipolar disorder ([place] 10/01/2017  . History of cesarean delivery 07/12/2017  . Family history of Huntington's disease   . Asthma affecting pregnancy, antepartum 05/10/2017  . Smoking (tobacco) complicating pregnancy, unspecified trimester 05/10/2017  . Seizure disorder during pregnancy, third trimester ([place] 05/10/2017  . History of anemia 04/19/2017  . History of maternal blood transfusion, currently pregnant 04/19/2017  . ADD (attention deficit disorder) 07/17/2015  . Post traumatic stress disorder (PTSD) 06/16/2013  . Depression 06/15/2013  . Suicidal ideation 06/15/2013  . Status epilepticus ([place] 06/14/2013  . Diabetes mellitus type 2 in obese ([place] 06/14/2013     Presents for painful lower abdomen and lower back tonight.  No VB or ROM.  Prior CS.  Has not had much to drink today.  No recent trauma or infection.  Has Diabetes.   [facility] P[facility] complicated by gestational DM, obesity.  ROS: A review of systems was performed and negative, except as stated in the above HPI. PMHx:  Past Medical History:  Diagnosis Date  . ADHD   . Anemia    BLOOD TRANSFUSION AFTER C-SECTION ON 06-2016-2 UNITS  . Anxiety   . Asthma    WELL CONTROLLED  . Bipolar 1 disorder, depressed (HC[place]   . Depression   . Diabetes mellitus without complication ([place]   . Gallstones   . GERD (gastroesophageal reflux  disease)   . History of blood transfusion 06/2016   RECEIVED 2 UNITS OF BLOOD AFTER C SECTION  . Migraines   . Ovarian cyst   . Seizures ([place]    LAST SEIZURE 2016  . Tubal pregnancy    PSHx:  Past Surgical History:  Procedure Laterality Date  . CESAREAN SECTION N/A 06/22/2016   Procedure: CESAREAN SECTION;  Surgeon: AMalachy Mood MD;  Location: [facility] ORS;  Service: Obstetrics;  Laterality: N/A;  . CESAREAN SECTION N/A 10/12/2017   Procedure: CESAREAN SECTION;  Surgeon: SMalachy Mood MD;  Location: [facility] ORS;  Service: Obstetrics;  Laterality: N/A;  . CHOLECYSTECTOMY N/A 08/13/2016   Procedure: LAPAROSCOPIC CHOLECYSTECTOMY;  Surgeon: DJules Husbands MD;  Location: [facility] ORS;  Service: General;  Laterality: N/A;  . DILATION AND CURETTAGE OF UTERUS    . ovarian cyst removed    . SALPINGECTOMY Right 2013   ectopic pregnancy. PER PATIENT, STILL HAS BOTH TUBES  . TONSILLECTOMY    . TONSILLECTOMY AND ADENOIDECTOMY     Medications:  Medications Prior to Admission  Medication Sig Dispense Refill Last Dose  . Accu-Chek Softclix Lancets lancets 1 each by Other route 4 (four) times daily. 100 each 12 02/18/2021 at Unknown time  . Blood Glucose Monitoring Suppl (ACCU-CHEK NANO SMARTVIEW) w/Device KIT 1 kit by Subdermal route as directed. Check blood sugars for fasting, and two hours after breakfast, lunch and dinner (4 checks daily) 1 kit 0 02/18/2021 at Unknown time  . ferrous sulfate 325 (65 FE) MG tablet Take 1 tablet (325 mg total) by mouth daily. 30 tablet 0 02/18/2021 at  Unknown time  . glucose blood (ACCU-CHEK SMARTVIEW) test strip Use as instructed to check blood sugars 100 each 12 02/18/2021 at Unknown time  . alprazolam (XANAX) 2 MG tablet Take by mouth.   Unknown at Unknown time  . nitrofurantoin, macrocrystal-monohydrate, (MACROBID) 100 MG capsule Take 1 capsule (100 mg total) by mouth 2 (two) times daily. 14 capsule 0 Unknown at Unknown time  . PARoxetine (PAXIL) 20 MG tablet Take by  mouth.   Unknown at Unknown time  . terconazole (TERAZOL 7) 0.4 % vaginal cream Place 1 applicator vaginally at bedtime. Apply once daily for 7 days 45 g 0 Unknown at Unknown time   Allergies: is allergic to ivp dye [iodinated diagnostic agents], latex, metrizamide, morphine, mushroom extract complex, sulfasalazine, tomato, toradol [ketorolac tromethamine], aspirin, dilaudid [hydromorphone hcl], lamictal [lamotrigine], sulfa antibiotics, tramadol, adhesive [tape], and iohexol. OBHx:  OB History  Gravida Para Term Preterm AB Living  6 2 2   3 2   SAB IAB Ectopic Multiple Live Births  3     0 2    # Outcome Date GA Lbr Len/2nd Weight Sex Delivery Anes PTL Lv  6 Current           5 Term 10/12/17 [redacted]w[redacted]d 4360 g F CS-LTranv Spinal  LIV  4 Term 06/22/16 [id number] g M CS-LTranv Spinal, EPI  LIV  3 SAB           2 SAB           1 SAB            FHUZH:QUIQNVVY/XAJLUNGBMBOMxcept as detailed in HPI.. Marland KitchenNo family history of birth defects. Soc Hx: Alcohol: none and Recreational drug use: none  Objective:   Vitals:   02/18/21 2345  BP: (!) 112/57  Pulse: 90  Resp: 18  Temp: 98.4 F (36.9 C)   Constitutional: Well nourished, well developed female in no acute distress.  HEENT: normal Skin: Warm and dry.  Cardiovascular:Regular rate and rhythm.   Extremity: trace to 1+ bilateral pedal edema Respiratory: Clear to auscultation bilateral. Normal respiratory effort Abdomen: gravid, ND, FHT present, mild tenderness on exam Back: no CVAT Neuro: DTRs 2+, Cranial nerves grossly intact Psych: Alert and Oriented x3. No memory deficits. Normal mood and affect.  MS: normal gait, normal bilateral lower extremity ROM/strength/stability.  Pelvic exam: is limited by body habitus EGBUS: within normal limits Vagina: within normal limits and with normal mucosa Cervix: CERVIX: 0 cm dilated, 20 % effaced, -3 station Uterus: No contractions observed for 30 minutes.  Adnexa: not evaluated  A NST procedure  was performed with FHR monitoring and a normal baseline established, appropriate time of 20-40 minutes of evaluation, and accels >2 seen w 15x15 characteristics.  Results show a REACTIVE NST.   Assessment & Plan:   [age].o. [id number] 3246w5ddmitted on 02/18/2021:Lower abdominal pain fFN UA Monitor for Ctxs, FHTs    PauBarnett ApplebaumD, FACLoura Pardon/Gyn, [place] 02/19/2021  12:26 AM

## 2021-02-19 NOTE — OB Triage Note (Signed)
RN attempted to administer Macrobid to patient to treat UTI. Pt stated that she was recently prescribed this medication, but she stopped taking it after 2 days due to developing thrush in her mouth.   Spoke with provider. He advised to administer 500mg  of Keflex.

## 2021-02-19 NOTE — Discharge Summary (Addendum)
OB Discharge Summary     Patient Name: Faith [name] DOB: Sep 01, 1991 MRN: [id number]  Date of admission: 02/18/2021 Attending MD: Hoyt Koch, MD   Date of discharge: 02/19/2021  Admitting diagnosis: Pregnancy related bilateral lower abdominal pain, antepartum [O26.899, R10.30] Intrauterine pregnancy: [redacted]w[redacted]d    Secondary diagnosis:  Obesity     Discharge diagnosis:  Same ,  UTI                          Hospital course:  30y.o. yo [id number] admitted with pain in back and lower abdomen, no s/sx PTL, and lab c/w UTI.  Fetal tracing reassuring.  Complications: None  Physical exam on 02/19/2021: Vitals:   02/18/21 2345  BP: (!) 112/57  Pulse: 90  Resp: 18  Temp: 98.4 F (36.9 C)  TempSrc: Oral  Weight: 102.1 kg  Height: 5' (1.524 m)   Labs: Lab Results  Component Value Date   WBC 15.9 (H) 01/10/2021   HGB 10.1 (L) 01/10/2021   HCT 32.6 (L) 01/10/2021   MCV 72 (L) 01/10/2021   PLT 309 01/10/2021   CMP Latest Ref Rng & Units 08/07/2020  Glucose 70 - 99 mg/dL 89  BUN 6 - 20 mg/dL 10  Creatinine 0.44 - 1.00 mg/dL 0.63  Sodium 135 - 145 mmol/L 138  Potassium 3.5 - 5.1 mmol/L 3.6  Chloride 98 - 111 mmol/L 101  CO2 22 - 32 mmol/L 27  Calcium 8.9 - 10.3 mg/dL 9.4  Total Protein 6.5 - 8.1 g/dL 8.2(H)  Total Bilirubin 0.3 - 1.2 mg/dL 0.4  Alkaline Phos 38 - 126 U/L 51  AST 15 - 41 U/L 13(L)  ALT 0 - 44 U/L 10    Discharge instruction: per After Visit Summary.  Medications:  Allergies as of 02/19/2021       Reactions   Ivp Dye [iodinated Diagnostic Agents] Anaphylaxis   Per patient, she does not have problems with betadine   Latex Rash   Metrizamide Anaphylaxis   Morphine Other (See Comments)   bradycradia   Mushroom Extract Complex Anaphylaxis   Hives then swelling of throat and can not breath   Sulfasalazine Hives   Tomato Anaphylaxis   Toradol [ketorolac Tromethamine] Anaphylaxis, Hives, Swelling   Aspirin Hives   Dilaudid [hydromorphone  Hcl] Hives   Lamictal [lamotrigine] Hives   Sulfa Antibiotics Hives   Tramadol Swelling, Rash, Hives   Adhesive [tape] Itching   USE PAPER TAPE   Iohexol Rash   (contrast dye) causes red bumps        Medication List     ASK your doctor about these medications    Accu-Chek Nano SmartView w/Device Kit 1 kit by Subdermal route as directed. Check blood sugars for fasting, and two hours after breakfast, lunch and dinner (4 checks daily)   Accu-Chek SmartView test strip Generic drug: glucose blood Use as instructed to check blood sugars   Accu-Chek Softclix Lancets lancets 1 each by Other route 4 (four) times daily.   alprazolam 2 MG tablet Commonly known as: XANAX Take by mouth.   ferrous sulfate 325 (65 FE) MG tablet Take 1 tablet (325 mg total) by mouth daily.   PARoxetine 20 MG tablet Commonly known as: PAXIL Take by mouth.   terconazole 0.4 % vaginal cream Commonly known as: Terazol 7 Place 1 applicator vaginally at bedtime. Apply once daily for 7 days      KEFLEX CALLED  IN    First dose here  Diet: routine diet  Activity: Advance as tolerated.    Outpatient follow up:  [facility] visit soon     SIGNED: Hoyt Koch, MD 02/19/2021 8:30 AM

## 2021-02-19 NOTE — Progress Notes (Addendum)
Pt discharged home per order.  Pt stable and ambulatory. An After Visit Summary was printed and given to the patient. Discharge education completed with family including follow up instruction and medication list. Pt received labor and bleeding precautions. Patient able to verbalize understanding, all questions fully answered. Pt discharged home via personal vehicle with support person.

## 2021-02-20 ENCOUNTER — Ambulatory Visit: Payer: Medicaid Other | Attending: Obstetrics and Gynecology

## 2021-02-20 ENCOUNTER — Other Ambulatory Visit: Payer: Self-pay

## 2021-02-20 ENCOUNTER — Ambulatory Visit: Payer: Medicaid Other | Admitting: *Deleted

## 2021-02-20 DIAGNOSIS — O0990 Supervision of high risk pregnancy, unspecified, unspecified trimester: Secondary | ICD-10-CM | POA: Insufficient documentation

## 2021-02-20 DIAGNOSIS — O24113 Pre-existing diabetes mellitus, type 2, in pregnancy, third trimester: Secondary | ICD-10-CM | POA: Diagnosis not present

## 2021-02-20 DIAGNOSIS — O34219 Maternal care for unspecified type scar from previous cesarean delivery: Secondary | ICD-10-CM | POA: Insufficient documentation

## 2021-02-20 DIAGNOSIS — J45909 Unspecified asthma, uncomplicated: Secondary | ICD-10-CM

## 2021-02-20 DIAGNOSIS — O24919 Unspecified diabetes mellitus in pregnancy, unspecified trimester: Secondary | ICD-10-CM | POA: Insufficient documentation

## 2021-02-20 DIAGNOSIS — Z3A32 32 weeks gestation of pregnancy: Secondary | ICD-10-CM

## 2021-02-20 DIAGNOSIS — G40909 Epilepsy, unspecified, not intractable, without status epilepticus: Secondary | ICD-10-CM

## 2021-02-20 DIAGNOSIS — O99519 Diseases of the respiratory system complicating pregnancy, unspecified trimester: Secondary | ICD-10-CM | POA: Diagnosis present

## 2021-02-20 DIAGNOSIS — O99213 Obesity complicating pregnancy, third trimester: Secondary | ICD-10-CM

## 2021-02-20 DIAGNOSIS — E119 Type 2 diabetes mellitus without complications: Secondary | ICD-10-CM

## 2021-02-20 DIAGNOSIS — O99353 Diseases of the nervous system complicating pregnancy, third trimester: Secondary | ICD-10-CM | POA: Insufficient documentation

## 2021-02-20 DIAGNOSIS — E669 Obesity, unspecified: Secondary | ICD-10-CM | POA: Diagnosis not present

## 2021-02-20 DIAGNOSIS — F17210 Nicotine dependence, cigarettes, uncomplicated: Secondary | ICD-10-CM

## 2021-02-20 DIAGNOSIS — O99333 Smoking (tobacco) complicating pregnancy, third trimester: Secondary | ICD-10-CM

## 2021-02-24 ENCOUNTER — Encounter: Payer: Self-pay | Admitting: Obstetrics and Gynecology

## 2021-02-24 ENCOUNTER — Ambulatory Visit (INDEPENDENT_AMBULATORY_CARE_PROVIDER_SITE_OTHER): Payer: Medicaid Other | Admitting: Obstetrics and Gynecology

## 2021-02-24 ENCOUNTER — Other Ambulatory Visit: Payer: Self-pay

## 2021-02-24 VITALS — BP 120/70 | Ht 60.0 in | Wt 225.2 lb

## 2021-02-24 DIAGNOSIS — Z3A33 33 weeks gestation of pregnancy: Secondary | ICD-10-CM

## 2021-02-24 DIAGNOSIS — O34219 Maternal care for unspecified type scar from previous cesarean delivery: Secondary | ICD-10-CM | POA: Diagnosis not present

## 2021-02-24 DIAGNOSIS — O0990 Supervision of high risk pregnancy, unspecified, unspecified trimester: Secondary | ICD-10-CM | POA: Diagnosis not present

## 2021-02-24 DIAGNOSIS — O24919 Unspecified diabetes mellitus in pregnancy, unspecified trimester: Secondary | ICD-10-CM | POA: Diagnosis not present

## 2021-02-24 LAB — GLUCOSE, POCT (MANUAL RESULT ENTRY): POC Glucose: 95 mg/dl (ref 70–99)

## 2021-02-24 LAB — FETAL NONSTRESS TEST

## 2021-02-24 LAB — POCT URINALYSIS DIPSTICK OB
Glucose, UA: NEGATIVE
POC,PROTEIN,UA: NEGATIVE

## 2021-02-24 NOTE — Progress Notes (Signed)
Routine Prenatal Care Visit  Subjective  Faith [name] is a 30 y.o. [id number] at [redacted]w[redacted]d being seen today for ongoing prenatal care.  She is currently monitored for the following issues for this high-risk pregnancy and has Status epilepticus (HCC); Diabetes mellitus type 2 in obese (HCC); Depression; Suicidal ideation; Post traumatic stress disorder (PTSD); ADD (attention deficit disorder); History of anemia; History of maternal blood transfusion, currently pregnant; Asthma affecting pregnancy, antepartum; Smoking (tobacco) complicating pregnancy, unspecified trimester; Seizure disorder during pregnancy, third trimester (HCC); Family history of Huntington's disease; History of cesarean delivery; Bipolar disorder (HCC); History of cesarean delivery, currently pregnant; Hidradenitis suppurativa; Supervision of high risk pregnancy, antepartum; Diabetes mellitus affecting pregnancy, unspecified trimester; Pregnancy related bilateral lower abdominal pain, antepartum; and Obesity affecting pregnancy in third trimester on their problem list.  ----------------------------------------------------------------------------------- Patient reports no complaints.   Contractions: Irregular. Vag. Bleeding: None.  Movement: Present. Denies leaking of fluid.  ----------------------------------------------------------------------------------- The following portions of the patient's history were reviewed and updated as appropriate: allergies, current medications, past family history, past medical history, past social history, past surgical history and problem list. Problem list updated.   Objective  Blood pressure 120/70, height 5' (1.524 m), weight 225 lb 3.2 oz (102.2 kg). Pregravid weight 199 lb (90.3 kg) Total Weight Gain 26 lb 3.2 oz (11.9 kg) Urinalysis:      Fetal Status:     Movement: Present     General:  Alert, oriented and cooperative. Patient is in no acute distress.  Skin: Skin is warm and dry. No rash  noted.   Cardiovascular: Normal heart rate noted  Respiratory: Normal respiratory effort, no problems with respiration noted  Abdomen: Soft, gravid, appropriate for gestational age. Pain/Pressure: Absent     Pelvic:  Cervical exam deferred        Extremities: Normal range of motion.     Mental Status: Normal mood and affect. Normal behavior. Normal judgment and thought content.     Assessment   30 y.o. [id number] at [redacted]w[redacted]d by  04/11/2021, by Ultrasound presenting for routine prenatal visit  Plan   PREGNANCY 7 Problems (from 08/28/20 to present)    Problem Noted Resolved   Obesity affecting pregnancy in third trimester 02/17/2021 by Tresea Mall, CNM No   Diabetes mellitus affecting pregnancy, unspecified trimester 09/19/2020 by Conard Novak, MD No   Supervision of high risk pregnancy, antepartum 09/04/2020 by Mirna Mires, CNM No   Overview Addendum 01/31/2021  2:56 PM by Zipporah Plants, CNM     Nursing Staff Provider  Office Location  [facility] Dating   7wk [place]  Language  English Anatomy US   complete, normal, XY  Flu Vaccine   declined Genetic Screen  NIPS: normal xy  TDaP vaccine   Declines Hgb A1C or  GTT  Type 2 DM  Covid    LAB RESULTS   Rhogam   not needed Blood Type O/Positive/-- (12/17 1028)   Feeding Plan  Antibody Negative (12/17 1028)  Contraception  unsure- options reviewed 01/31/21 Rubella 1.76 (12/17 1028)  Circumcision  RPR Non Reactive (12/17 1028)   Pediatrician   HBsAg Negative (12/17 1028)   Support Person  HIV Non Reactive (12/17 1028)  Prenatal Classes  Varicella     GBS  (For PCN allergy, check sensitivities)   BTL Consent  n/a    VBAC Consent  hx of 2  LTCS Pap  08/28/20 - NILM    Hgb Electro      CF  SMA         High risk pregnancy diagnoses:  Type 2 diabetes in pregnancy - was taking metformin but discontinued secondary to hypoglycemia Obesity in pregnancy: pregravid BMI 36 History of 2 prior cesarean deliveries Tobacco use during  pregnancy Seizure disorder Asthma      Previous Version   History of cesarean delivery, currently pregnant 10/12/2017 by Vena Austria, MD No   Overview Signed 09/19/2020  2:12 PM by Conard Novak, MD    Arrest of dilation at 7cm after 3 day IOL Baby 8#7oz Blood transfusion postpartum      Asthma affecting pregnancy, antepartum 05/10/2017 by Lady Deutscher, MD No   Seizure disorder during pregnancy, third trimester (HCC) 05/10/2017 by Lady Deutscher, MD No      NST: 145 bpm baseline, moderate variability, 15x15 accelerations, NO decelerations. Reactive  Declines TDAP  No glucose log today. Reports she generally check her BG twice a day. Fasting it is in the 60s, after meals it is 120 or less. She did not have her glucose meter today with her and reports it is in storage while she moves.  Random glucose today is 95.  Gestational age appropriate obstetric precautions including but not limited to vaginal bleeding, contractions, leaking of fluid and fetal movement were reviewed in detail with the patient.    Return in about 1 week (around 03/03/2021) for ROB/NST with MD.  Natale Milch MD [facility], [facility] 02/24/2021, 2:32 PM

## 2021-02-24 NOTE — Patient Instructions (Addendum)
Gestational Diabetes Mellitus, Self-Care Caring for yourself after a diagnosis of gestational diabetes mellitus means keeping your blood sugar under control. This can be done through nutrition, exercise, lifestyle changes, insulin and other medicines, and support from your health care team. Your health care provider will set individualized treatment goals for you. What are the risks? If left untreated, gestational diabetes can cause problems for mother and baby. For the mother Women who get gestational diabetes are more likely to:  Have labor induced and deliver early.  Have problems during labor and delivery, if the baby is larger than normal. This includes difficult labor and damage to the birth canal.  Have a cesarean delivery.  Have problems with blood pressure, including high blood pressure and preeclampsia.  Get it again if they become pregnant.  Develop type 2 diabetes in the future. For the baby Gestational diabetes that is not treated can cause the baby to have:  Low blood glucose (hypoglycemia).  Larger-than-normal body size (macrosomia).  Breathing problems. How to monitor blood glucose Check your blood glucose every day and as often as told by your health care provider. To do this: 1. Wash your hands with soap and water for at least 20 seconds. 2. Prick the side of your finger (not the tip) with the lancet. Use a different finger each time. 3. Gently rub the finger until a small drop of blood appears. 4. Follow instructions that come with your meter for inserting the test strip, applying blood to the strip, and getting the result. 5. Write down your result and any notes. Blood glucose goals are:  95 mg/dL (5.3 mmol/L) when fasting.  140 mg/dL (7.8 mmol/L) 1 hour after a meal.  120 mg/dL (6.7 mmol/L) 2 hours after a meal.   Follow these instructions at home: Medicines  Take over-the-counter and prescription medicines only as told by your health care  provider.  If your health care provider prescribed insulin or other diabetes medicines: ? Take them every day. ? Do not run out of insulin or other medicines. Plan ahead so you always have them available. Eating and drinking  Follow instructions from your health care provider about eating or drinking restrictions.  See a diet and nutrition expert (registered dietician) to help you create an eating plan that helps control your diabetes. The foods in this plan will include: ? Lean proteins. ? Complex carbohydrates. These are carbohydrates that contain fiber, have a lot of nutrients, and are digested slowly. They include dried beans, nuts, and whole grain breads, cereals, or pasta. ? Fresh fruits and vegetables. ? Low-fat dairy products. ? Healthy fats.  Eat healthy snacks between nutritious meals.  Drink enough fluid to keep your urine pale yellow.  Keep a record of the carbohydrates that you eat. To do this: ? Read food labels. ? Learn the standard serving sizes of foods.  Make a sick day plan with your health care provider before you get sick. Follow this plan whenever you cannot eat or drink as usual.   Activity  Do exercises as told by your health care provider.  Do 30 or more minutes of physical activity a day, or as much physical activity as your health care provider recommends. It may help to control blood glucose levels after a meal if you: ? Do 10 minutes of exercise after each meal. ? Start this exercise 30 minutes after the meal.  If you start a new exercise or activity, work with your health care provider to adjust your  insulin, other medicines, or food as needed. Lifestyle  Do not drink alcohol.  Do not use any products that contain nicotine or tobacco, such as cigarettes, e-cigarettes, and chewing tobacco. If you need help quitting, ask your health care provider.  Learn to manage stress. If you need help with this, ask your health care provider. Body care  Keep  your vaccines up to date.  Practice good oral hygiene. To do this: ? Clean your teeth and gums two times a day. ? Floss one or more times a day. ? Visit your dentist one or more times every 6 months.  Stay at a healthy weight while you are pregnant. Your expected weight gain depends on your BMI (body mass index) before pregnancy. General instructions  Talk with your health care provider about the risk for high blood pressure during pregnancy (preeclampsia and eclampsia).  Share your diabetes management plan with people in your workplace, school, and household.  Check your urine for ketones when sick and as told by your health care provider. Ketones are made by the liver when a lack of glucose forces the body to use fat for energy.  Carry a medical alert card or wear medical alert jewelry that says you have gestational diabetes.  Keep all follow-up visits. This is important. Get care after delivery  Have your blood glucose level checked with an oral glucose tolerance test (OGTT) 4-12 weeks after delivery.  Get screened for diabetes at least every 3 years, or as often as told by your health care provider. Where to find more information  American Diabetes Association (ADA): diabetes.org  Association of Diabetes Care & Education Specialists (ADCES): diabeteseducator.org  Centers for Disease Control and Prevention (CDC): TonerPromos.no  American Pregnancy Association: americanpregnancy.org  [place]. Department of Agriculture MyPlate: WrestlingReporter.dk Contact a health care provider if:  Your blood glucose is above your target for two tests in a row.  You have a fever.  You have been sick for 2 days or more and are not getting better.  You have either of these problems for more than 6 hours: ? Vomiting every time you eat or drink. ? Diarrhea. Get help right away if you:  Become confused or cannot think clearly.  Have trouble breathing.  Have moderate or high ketones in your  urine.  Feel your baby is not moving as usual.  Develop unusual discharge or bleeding from your vagina.  Start having early (premature) contractions. Contractions may feel like a tightening in your lower abdomen  Have a severe headache. These symptoms may represent a serious problem that is an emergency. Do not wait to see if the symptoms will go away. Get medical help right away. Call your local emergency services (911 in the [place] Do not drive yourself to the hospital. Summary  Check your blood glucose every day during your pregnancy. Do this as often as told by your health care provider.  Take insulin or other diabetes medicines every day, if your health care provider prescribed them.  Have your blood glucose level checked 4-12 weeks after delivery.  Keep all follow-up visits. This is important. This information is not intended to replace advice given to you by your health care provider. Make sure you discuss any questions you have with your health care provider. Document Revised: 02/26/2020 Document Reviewed: 02/26/2020 Elsevier Patient Education  2021 [employer].  Third Trimester of Pregnancy  The third trimester of pregnancy is from week 28 through week 40. This is months 7 through 9.  The third trimester is a time when the unborn baby (fetus) is growing rapidly. At the end of the ninth month, the fetus is about 20 inches long and weighs 6-10 pounds. Body changes during your third trimester During the third trimester, your body will continue to go through many changes. The changes vary and generally return to normal after your baby is born. Physical changes  Your weight will continue to increase. You can expect to gain 25-35 pounds (11-16 kg) by the end of the pregnancy if you begin pregnancy at a normal weight. If you are underweight, you can expect to gain 28-40 lb (about 13-18 kg), and if you are overweight, you can expect to gain 15-25 lb (about 7-11 kg).  You may begin  to get stretch marks on your hips, abdomen, and breasts.  Your breasts will continue to grow and may hurt. A yellow fluid (colostrum) may leak from your breasts. This is the first milk you are producing for your baby.  You may have changes in your hair. These can include thickening of your hair, rapid growth, and changes in texture. Some people also have hair loss during or after pregnancy, or hair that feels dry or thin.  Your belly button may stick out.  You may notice more swelling in your hands, face, or ankles. Health changes  You may have heartburn.  You may have constipation.  You may develop hemorrhoids.  You may develop swollen, bulging veins (varicose veins) in your legs.  You may have increased body aches in the pelvis, back, or thighs. This is due to weight gain and increased hormones that are relaxing your joints.  You may have increased tingling or numbness in your hands, arms, and legs. The skin on your abdomen may also feel numb.  You may feel short of breath because of your expanding uterus. Other changes  You may urinate more often because the fetus is moving lower into your pelvis and pressing on your bladder.  You may have more problems sleeping. This may be caused by the size of your abdomen, an increased need to urinate, and an increase in your body's metabolism.  You may notice the fetus "dropping," or moving lower in your abdomen (lightening).  You may have increased vaginal discharge.  You may notice that you have pain around your pelvic bone as your uterus distends. Follow these instructions at home: Medicines  Follow your health care provider's instructions regarding medicine use. Specific medicines may be either safe or unsafe to take during pregnancy. Do not take any medicines unless approved by your health care provider.  Take a prenatal vitamin that contains at least 600 micrograms (mcg) of folic acid. Eating and drinking  Eat a healthy diet  that includes fresh fruits and vegetables, whole grains, good sources of protein such as meat, eggs, or tofu, and low-fat dairy products.  Avoid raw meat and unpasteurized juice, milk, and cheese. These carry germs that can harm you and your baby.  Eat 4 or 5 small meals rather than 3 large meals a day.  You may need to take these actions to prevent or treat constipation: ? Drink enough fluid to keep your urine pale yellow. ? Eat foods that are high in fiber, such as beans, whole grains, and fresh fruits and vegetables. ? Limit foods that are high in fat and processed sugars, such as fried or sweet foods. Activity  Exercise only as directed by your health care provider. Most people can continue their  usual exercise routine during pregnancy. Try to exercise for 30 minutes at least 5 days a week. Stop exercising if you experience contractions in the uterus.  Stop exercising if you develop pain or cramping in the lower abdomen or lower back.  Avoid heavy lifting.  Do not exercise if it is very hot or humid or if you are at a high altitude.  If you choose to, you may continue to have sex unless your health care provider tells you not to. Relieving pain and discomfort  Take frequent breaks and rest with your legs raised (elevated) if you have leg cramps or low back pain.  Take warm sitz baths to soothe any pain or discomfort caused by hemorrhoids. Use hemorrhoid cream if your health care provider approves.  Wear a supportive bra to prevent discomfort from breast tenderness.  If you develop varicose veins: ? Wear support hose as told by your health care provider. ? Elevate your feet for 15 minutes, 3-4 times a day. ? Limit salt in your diet. Safety  Talk to your health care provider before traveling far distances.  Do not use hot tubs, steam rooms, or saunas.  Wear your seat belt at all times when driving or riding in a car.  Talk with your health care provider if someone is  verbally or physically abusive to you. Preparing for birth To prepare for the arrival of your baby:  Take prenatal classes to understand, practice, and ask questions about labor and delivery.  Visit the hospital and tour the maternity area.  Purchase a rear-facing car seat and make sure you know how to install it in your car.  Prepare the baby's room or sleeping area. Make sure to remove all pillows and stuffed animals from the baby's crib to prevent suffocation. General instructions  Avoid cat litter boxes and soil used by cats. These carry germs that can cause birth defects in the baby. If you have a cat, ask someone to clean the litter box for you.  Do not douche or use tampons. Do not use scented sanitary pads.  Do not use any products that contain nicotine or tobacco, such as cigarettes, e-cigarettes, and chewing tobacco. If you need help quitting, ask your health care provider.  Do not use any herbal remedies, illegal drugs, or medicines that were not prescribed to you. Chemicals in these products can harm your baby.  Do not drink alcohol.  You will have more frequent prenatal exams during the third trimester. During a routine prenatal visit, your health care provider will do a physical exam, perform tests, and discuss your overall health. Keep all follow-up visits. This is important. Where to find more information  American Pregnancy Association: americanpregnancy.org  [employer] of Obstetricians and Gynecologists: https://www.todd-brady.net/  Office on [employer] Health: MightyReward.co.nz Contact a health care provider if you have:  A fever.  Mild pelvic cramps, pelvic pressure, or nagging pain in your abdominal area or lower back.  Vomiting or diarrhea.  Bad-smelling vaginal discharge or foul-smelling urine.  Pain when you urinate.  A headache that does not go away when you take medicine.  Visual changes or see spots in front of your  eyes. Get help right away if:  Your water breaks.  You have regular contractions less than 5 minutes apart.  You have spotting or bleeding from your vagina.  You have severe abdominal pain.  You have difficulty breathing.  You have chest pain.  You have fainting spells.  You have not felt  your baby move for the time period told by your health care provider.  You have new or increased pain, swelling, or redness in an arm or leg. Summary  The third trimester of pregnancy is from week 28 through week 40 (months 7 through 9).  You may have more problems sleeping. This can be caused by the size of your abdomen, an increased need to urinate, and an increase in your body's metabolism.  You will have more frequent prenatal exams during the third trimester. Keep all follow-up visits. This is important. This information is not intended to replace advice given to you by your health care provider. Make sure you discuss any questions you have with your health care provider. Document Revised: 02/28/2020 Document Reviewed: 01/04/2020 Elsevier Patient Education  2021 [employer].

## 2021-02-26 ENCOUNTER — Encounter: Payer: Self-pay | Admitting: *Deleted

## 2021-02-26 ENCOUNTER — Other Ambulatory Visit: Payer: Self-pay

## 2021-02-26 ENCOUNTER — Ambulatory Visit: Payer: Medicaid Other | Admitting: *Deleted

## 2021-02-26 ENCOUNTER — Ambulatory Visit: Payer: Medicaid Other | Attending: Obstetrics and Gynecology

## 2021-02-26 DIAGNOSIS — J45909 Unspecified asthma, uncomplicated: Secondary | ICD-10-CM | POA: Insufficient documentation

## 2021-02-26 DIAGNOSIS — O99213 Obesity complicating pregnancy, third trimester: Secondary | ICD-10-CM | POA: Diagnosis present

## 2021-02-26 DIAGNOSIS — O321XX0 Maternal care for breech presentation, not applicable or unspecified: Secondary | ICD-10-CM

## 2021-02-26 DIAGNOSIS — O34219 Maternal care for unspecified type scar from previous cesarean delivery: Secondary | ICD-10-CM | POA: Diagnosis present

## 2021-02-26 DIAGNOSIS — O24919 Unspecified diabetes mellitus in pregnancy, unspecified trimester: Secondary | ICD-10-CM | POA: Diagnosis present

## 2021-02-26 DIAGNOSIS — O99333 Smoking (tobacco) complicating pregnancy, third trimester: Secondary | ICD-10-CM

## 2021-02-26 DIAGNOSIS — O99353 Diseases of the nervous system complicating pregnancy, third trimester: Secondary | ICD-10-CM | POA: Diagnosis present

## 2021-02-26 DIAGNOSIS — E119 Type 2 diabetes mellitus without complications: Secondary | ICD-10-CM

## 2021-02-26 DIAGNOSIS — Z3A33 33 weeks gestation of pregnancy: Secondary | ICD-10-CM

## 2021-02-26 DIAGNOSIS — O24113 Pre-existing diabetes mellitus, type 2, in pregnancy, third trimester: Secondary | ICD-10-CM | POA: Insufficient documentation

## 2021-02-26 DIAGNOSIS — E669 Obesity, unspecified: Secondary | ICD-10-CM | POA: Diagnosis not present

## 2021-02-26 DIAGNOSIS — G40909 Epilepsy, unspecified, not intractable, without status epilepticus: Secondary | ICD-10-CM | POA: Insufficient documentation

## 2021-02-26 DIAGNOSIS — O0990 Supervision of high risk pregnancy, unspecified, unspecified trimester: Secondary | ICD-10-CM | POA: Diagnosis present

## 2021-02-26 DIAGNOSIS — O99519 Diseases of the respiratory system complicating pregnancy, unspecified trimester: Secondary | ICD-10-CM | POA: Diagnosis present

## 2021-02-26 DIAGNOSIS — F17210 Nicotine dependence, cigarettes, uncomplicated: Secondary | ICD-10-CM

## 2021-03-06 ENCOUNTER — Encounter: Payer: Self-pay | Admitting: *Deleted

## 2021-03-06 ENCOUNTER — Other Ambulatory Visit: Payer: Self-pay | Admitting: *Deleted

## 2021-03-06 ENCOUNTER — Other Ambulatory Visit: Payer: Self-pay

## 2021-03-06 ENCOUNTER — Ambulatory Visit: Payer: Medicaid Other | Admitting: *Deleted

## 2021-03-06 ENCOUNTER — Ambulatory Visit: Payer: Medicaid Other | Attending: Obstetrics and Gynecology

## 2021-03-06 DIAGNOSIS — O99213 Obesity complicating pregnancy, third trimester: Secondary | ICD-10-CM | POA: Insufficient documentation

## 2021-03-06 DIAGNOSIS — J45909 Unspecified asthma, uncomplicated: Secondary | ICD-10-CM | POA: Diagnosis present

## 2021-03-06 DIAGNOSIS — O99519 Diseases of the respiratory system complicating pregnancy, unspecified trimester: Secondary | ICD-10-CM | POA: Insufficient documentation

## 2021-03-06 DIAGNOSIS — O0990 Supervision of high risk pregnancy, unspecified, unspecified trimester: Secondary | ICD-10-CM | POA: Insufficient documentation

## 2021-03-06 DIAGNOSIS — O24919 Unspecified diabetes mellitus in pregnancy, unspecified trimester: Secondary | ICD-10-CM

## 2021-03-06 DIAGNOSIS — E119 Type 2 diabetes mellitus without complications: Secondary | ICD-10-CM | POA: Diagnosis not present

## 2021-03-06 DIAGNOSIS — G40909 Epilepsy, unspecified, not intractable, without status epilepticus: Secondary | ICD-10-CM

## 2021-03-06 DIAGNOSIS — O24113 Pre-existing diabetes mellitus, type 2, in pregnancy, third trimester: Secondary | ICD-10-CM | POA: Insufficient documentation

## 2021-03-06 DIAGNOSIS — O34219 Maternal care for unspecified type scar from previous cesarean delivery: Secondary | ICD-10-CM | POA: Insufficient documentation

## 2021-03-06 DIAGNOSIS — E669 Obesity, unspecified: Secondary | ICD-10-CM | POA: Diagnosis not present

## 2021-03-06 DIAGNOSIS — O99353 Diseases of the nervous system complicating pregnancy, third trimester: Secondary | ICD-10-CM | POA: Insufficient documentation

## 2021-03-06 DIAGNOSIS — F17210 Nicotine dependence, cigarettes, uncomplicated: Secondary | ICD-10-CM

## 2021-03-06 DIAGNOSIS — O99333 Smoking (tobacco) complicating pregnancy, third trimester: Secondary | ICD-10-CM

## 2021-03-06 DIAGNOSIS — O321XX0 Maternal care for breech presentation, not applicable or unspecified: Secondary | ICD-10-CM

## 2021-03-06 DIAGNOSIS — Z3A34 34 weeks gestation of pregnancy: Secondary | ICD-10-CM

## 2021-03-11 ENCOUNTER — Encounter: Payer: Self-pay | Admitting: Obstetrics and Gynecology

## 2021-03-11 ENCOUNTER — Ambulatory Visit (INDEPENDENT_AMBULATORY_CARE_PROVIDER_SITE_OTHER): Payer: Medicaid Other | Admitting: Obstetrics and Gynecology

## 2021-03-11 ENCOUNTER — Other Ambulatory Visit: Payer: Self-pay

## 2021-03-11 VITALS — BP 118/72 | HR 105 | Ht 60.0 in | Wt 226.8 lb

## 2021-03-11 DIAGNOSIS — O0990 Supervision of high risk pregnancy, unspecified, unspecified trimester: Secondary | ICD-10-CM

## 2021-03-11 DIAGNOSIS — O99353 Diseases of the nervous system complicating pregnancy, third trimester: Secondary | ICD-10-CM

## 2021-03-11 DIAGNOSIS — R875 Abnormal microbiological findings in specimens from female genital organs: Secondary | ICD-10-CM

## 2021-03-11 DIAGNOSIS — O24919 Unspecified diabetes mellitus in pregnancy, unspecified trimester: Secondary | ICD-10-CM

## 2021-03-11 DIAGNOSIS — Z3A35 35 weeks gestation of pregnancy: Secondary | ICD-10-CM

## 2021-03-11 DIAGNOSIS — O4700 False labor before 37 completed weeks of gestation, unspecified trimester: Secondary | ICD-10-CM

## 2021-03-11 DIAGNOSIS — G40909 Epilepsy, unspecified, not intractable, without status epilepticus: Secondary | ICD-10-CM

## 2021-03-11 LAB — OB RESULTS CONSOLE GC/CHLAMYDIA
Chlamydia: NEGATIVE
Gonorrhea: NEGATIVE

## 2021-03-11 LAB — POCT URINALYSIS DIPSTICK OB
Glucose, UA: NEGATIVE
POC,PROTEIN,UA: NEGATIVE

## 2021-03-11 LAB — GLUCOSE, POCT (MANUAL RESULT ENTRY): POC Glucose: 111 mg/dl — AB (ref 70–99)

## 2021-03-11 NOTE — Progress Notes (Signed)
Routine Prenatal Care Visit  Subjective  Faith [name] is a 30 y.o. [id number] at [redacted]w[redacted]d being seen today for ongoing prenatal care.  She is currently monitored for the following issues for this high-risk pregnancy and has Status epilepticus (HCC); Diabetes mellitus type 2 in obese (HCC); Depression; Suicidal ideation; Post traumatic stress disorder (PTSD); ADD (attention deficit disorder); History of anemia; History of maternal blood transfusion, currently pregnant; Asthma affecting pregnancy, antepartum; Smoking (tobacco) complicating pregnancy, unspecified trimester; Seizure disorder during pregnancy, third trimester (HCC); Family history of Huntington's disease; History of cesarean delivery; Bipolar disorder (HCC); History of cesarean delivery, currently pregnant; Hidradenitis suppurativa; Supervision of high risk pregnancy, antepartum; Diabetes mellitus affecting pregnancy, unspecified trimester; Pregnancy related bilateral lower abdominal pain, antepartum; and Obesity affecting pregnancy in third trimester on their problem list.  ----------------------------------------------------------------------------------- Patient reports no complaints.   She reports contractions every 8 minutes for the last day.   Contractions: Irritability. Vag. Bleeding: None.  Movement: Present. Denies leaking of fluid.  ----------------------------------------------------------------------------------- The following portions of the patient's history were reviewed and updated as appropriate: allergies, current medications, past family history, past medical history, past social history, past surgical history and problem list. Problem list updated.   Objective  Blood pressure 118/72, pulse (!) 105, height 5' (1.524 m), weight 226 lb 12.8 oz (102.9 kg). Pregravid weight 199 lb (90.3 kg) Total Weight Gain 27 lb 12.8 oz (12.6 kg) Urinalysis:      Fetal Status: Fetal Heart Rate (bpm): RNST   Movement: Present      General:  Alert, oriented and cooperative. Patient is in no acute distress.  Skin: Skin is warm and dry. No rash noted.   Cardiovascular: Normal heart rate noted  Respiratory: Normal respiratory effort, no problems with respiration noted  Abdomen: Soft, gravid, appropriate for gestational age. Pain/Pressure: Present     Pelvic:  Cervical exam performed Dilation: Closed Effacement (%): 0 Station: -3  Extremities: Normal range of motion.  Edema: None  Mental Status: Normal mood and affect. Normal behavior. Normal judgment and thought content.     Assessment   30 y.o. [id number] at [redacted]w[redacted]d by  04/11/2021, by Ultrasound presenting for routine prenatal visit  Plan   PREGNANCY 7 Problems (from 08/28/20 to present)    Problem Noted Resolved   Obesity affecting pregnancy in third trimester 02/17/2021 by Tresea Mall, CNM No   Diabetes mellitus affecting pregnancy, unspecified trimester 09/19/2020 by Conard Novak, MD No   Supervision of high risk pregnancy, antepartum 09/04/2020 by Mirna Mires, CNM No   Overview Addendum 02/24/2021  2:44 PM by Natale Milch, MD     Nursing Staff Provider  Office Location  [facility] Dating   7wk [place]  Language  English Anatomy US   complete, normal, XY  Flu Vaccine   declined Genetic Screen  NIPS: normal xy  TDaP vaccine   Declines Hgb A1C or  GTT  Type 2 DM  Covid    LAB RESULTS   Rhogam   not needed Blood Type O/Positive/-- (12/17 1028)   Feeding Plan  Antibody Negative (12/17 1028)  Contraception  unsure- options reviewed 01/31/21 Rubella 1.76 (12/17 1028)  Circumcision  RPR Non Reactive (12/17 1028)   Pediatrician   HBsAg Negative (12/17 1028)   Support Person  HIV Non Reactive (12/17 1028)  Prenatal Classes  Varicella     GBS  (For PCN allergy, check sensitivities)   BTL Consent  n/a    VBAC Consent  hx  of 2  LTCS Pap  08/28/20 - NILM    Hgb Electro      CF       SMA         High risk pregnancy diagnoses:  Type 2 diabetes in  pregnancy - was taking metformin but discontinued secondary to hypoglycemia Obesity in pregnancy: pregravid BMI 36 History of 2 prior cesarean deliveries Tobacco use during pregnancy Seizure disorder Asthma      Previous Version   History of cesarean delivery, currently pregnant 10/12/2017 by Vena Austria, MD No   Overview Signed 09/19/2020  2:12 PM by Conard Novak, MD    Arrest of dilation at 7cm after 3 day IOL Baby 8#7oz Blood transfusion postpartum      Asthma affecting pregnancy, antepartum 05/10/2017 by Lady Deutscher, MD No   Seizure disorder during pregnancy, third trimester (HCC) 05/10/2017 by Lady Deutscher, MD No       She has been unable to check her blood glucose at home, reports the meter is in storage. She is living with her boyfriend.  GBS, GC, CT today. FFN today.  Office blood glucose stick: 3 hours after eating 51  Gestational age appropriate obstetric precautions including but not limited to vaginal bleeding, contractions, leaking of fluid and fetal movement were reviewed in detail with the patient.    Return in about 1 week (around 03/18/2021) for ROB / NST  in person with MD.  Natale Milch MD [facility], [facility] 03/11/2021, 3:22 PM

## 2021-03-11 NOTE — Addendum Note (Signed)
Addended by: Clement Husbands A on: 03/11/2021 04:34 PM   Modules accepted: Orders

## 2021-03-11 NOTE — Patient Instructions (Signed)

## 2021-03-12 ENCOUNTER — Ambulatory Visit: Payer: Medicaid Other | Attending: Obstetrics and Gynecology

## 2021-03-12 ENCOUNTER — Ambulatory Visit: Payer: Medicaid Other | Admitting: *Deleted

## 2021-03-12 ENCOUNTER — Encounter: Payer: Self-pay | Admitting: *Deleted

## 2021-03-12 DIAGNOSIS — O0990 Supervision of high risk pregnancy, unspecified, unspecified trimester: Secondary | ICD-10-CM

## 2021-03-12 DIAGNOSIS — J45909 Unspecified asthma, uncomplicated: Secondary | ICD-10-CM | POA: Diagnosis present

## 2021-03-12 DIAGNOSIS — E669 Obesity, unspecified: Secondary | ICD-10-CM

## 2021-03-12 DIAGNOSIS — O24919 Unspecified diabetes mellitus in pregnancy, unspecified trimester: Secondary | ICD-10-CM

## 2021-03-12 DIAGNOSIS — G40909 Epilepsy, unspecified, not intractable, without status epilepticus: Secondary | ICD-10-CM

## 2021-03-12 DIAGNOSIS — O99333 Smoking (tobacco) complicating pregnancy, third trimester: Secondary | ICD-10-CM

## 2021-03-12 DIAGNOSIS — O99213 Obesity complicating pregnancy, third trimester: Secondary | ICD-10-CM

## 2021-03-12 DIAGNOSIS — O99519 Diseases of the respiratory system complicating pregnancy, unspecified trimester: Secondary | ICD-10-CM | POA: Diagnosis present

## 2021-03-12 DIAGNOSIS — O99353 Diseases of the nervous system complicating pregnancy, third trimester: Secondary | ICD-10-CM | POA: Insufficient documentation

## 2021-03-12 DIAGNOSIS — O34219 Maternal care for unspecified type scar from previous cesarean delivery: Secondary | ICD-10-CM

## 2021-03-12 DIAGNOSIS — Z3A35 35 weeks gestation of pregnancy: Secondary | ICD-10-CM

## 2021-03-12 DIAGNOSIS — O24113 Pre-existing diabetes mellitus, type 2, in pregnancy, third trimester: Secondary | ICD-10-CM

## 2021-03-13 ENCOUNTER — Telehealth: Payer: Self-pay

## 2021-03-13 ENCOUNTER — Encounter: Payer: Medicaid Other | Admitting: Obstetrics & Gynecology

## 2021-03-13 NOTE — Telephone Encounter (Signed)
Pt returned call; states is no longer bleeding; MMF adv her to hold off coming in d/t pt being ck'd earlier.  Pt states she is having a watery/mucus type stuff coming out; states ctxs are 8-53min apart.  LMTC; pt returned call; adv to go to L&D via ED d/t having more than four ctxs an hour.  Jessica notified in L&D.

## 2021-03-13 NOTE — Telephone Encounter (Signed)
Pt called after hour nurse 03/13/21 1:17am; 36wks; having vaginal bleeding - dark and clots; reports what she feels like ctxs but not sure of time or durations.  After hour nurse adv pt to go to L&D; pt unsure; oncall, MMF, was to call pt before going to ED.  [phone]  [facility]

## 2021-03-14 ENCOUNTER — Other Ambulatory Visit: Payer: Self-pay

## 2021-03-14 ENCOUNTER — Ambulatory Visit (INDEPENDENT_AMBULATORY_CARE_PROVIDER_SITE_OTHER): Payer: Medicaid Other | Admitting: Obstetrics and Gynecology

## 2021-03-14 ENCOUNTER — Encounter: Payer: Self-pay | Admitting: Obstetrics & Gynecology

## 2021-03-14 ENCOUNTER — Observation Stay
Admission: EM | Admit: 2021-03-14 | Discharge: 2021-03-14 | Disposition: A | Discharge status: Home / Self Care | Payer: Medicaid Other | Attending: Obstetrics & Gynecology | Admitting: Obstetrics & Gynecology

## 2021-03-14 DIAGNOSIS — Z3A36 36 weeks gestation of pregnancy: Secondary | ICD-10-CM

## 2021-03-14 DIAGNOSIS — O26899 Other specified pregnancy related conditions, unspecified trimester: Secondary | ICD-10-CM

## 2021-03-14 DIAGNOSIS — O24919 Unspecified diabetes mellitus in pregnancy, unspecified trimester: Secondary | ICD-10-CM

## 2021-03-14 DIAGNOSIS — O26893 Other specified pregnancy related conditions, third trimester: Principal | ICD-10-CM | POA: Insufficient documentation

## 2021-03-14 DIAGNOSIS — O99519 Diseases of the respiratory system complicating pregnancy, unspecified trimester: Secondary | ICD-10-CM

## 2021-03-14 DIAGNOSIS — R1030 Lower abdominal pain, unspecified: Secondary | ICD-10-CM | POA: Diagnosis not present

## 2021-03-14 DIAGNOSIS — J45909 Unspecified asthma, uncomplicated: Secondary | ICD-10-CM

## 2021-03-14 DIAGNOSIS — Z3490 Encounter for supervision of normal pregnancy, unspecified, unspecified trimester: Secondary | ICD-10-CM

## 2021-03-14 DIAGNOSIS — O0990 Supervision of high risk pregnancy, unspecified, unspecified trimester: Secondary | ICD-10-CM

## 2021-03-14 DIAGNOSIS — G40909 Epilepsy, unspecified, not intractable, without status epilepticus: Secondary | ICD-10-CM

## 2021-03-14 DIAGNOSIS — O99213 Obesity complicating pregnancy, third trimester: Secondary | ICD-10-CM

## 2021-03-14 DIAGNOSIS — O34219 Maternal care for unspecified type scar from previous cesarean delivery: Secondary | ICD-10-CM

## 2021-03-14 LAB — NUSWAB VAGINITIS PLUS (VG+)
Candida albicans, NAA: NEGATIVE
Candida glabrata, NAA: NEGATIVE
Chlamydia trachomatis, NAA: NEGATIVE
Megasphaera 1: HIGH Score — AB
Neisseria gonorrhoeae, NAA: NEGATIVE
Trich vag by NAA: NEGATIVE

## 2021-03-14 NOTE — Progress Notes (Signed)
Routine Prenatal Care Visit  Subjective  Faith [name] is a 30 y.o. [id number] at [redacted]w[redacted]d being seen today for ongoing prenatal care.  She is currently monitored for the following issues for this high-risk pregnancy and has Status epilepticus (HCC); Diabetes mellitus type 2 in obese (HCC); Depression; Suicidal ideation; Post traumatic stress disorder (PTSD); ADD (attention deficit disorder); History of anemia; History of maternal blood transfusion, currently pregnant; Asthma affecting pregnancy, antepartum; Smoking (tobacco) complicating pregnancy, unspecified trimester; Seizure disorder during pregnancy, third trimester (HCC); Family history of Huntington's disease; History of cesarean delivery; Bipolar disorder (HCC); History of cesarean delivery, currently pregnant; Hidradenitis suppurativa; Supervision of high risk pregnancy, antepartum; Diabetes mellitus affecting pregnancy, unspecified trimester; Pregnancy related bilateral lower abdominal pain, antepartum; Obesity affecting pregnancy in third trimester; and Pregnancy on their problem list.  ----------------------------------------------------------------------------------- Patient reports contractions since yesterday evening, seen for labor eval on L&D 12-hrs ago and not in labor .   Contractions: Irregular. Vag. Bleeding: None.  Movement: Present. Denies leaking of fluid.  ----------------------------------------------------------------------------------- The following portions of the patient's history were reviewed and updated as appropriate: allergies, current medications, past family history, past medical history, past social history, past surgical history and problem list. Problem list updated.   Objective  There were no vitals taken for this visit. Pregravid weight 199 lb (90.3 kg) Total Weight Gain 27 lb (12.2 kg) Urinalysis:      Fetal Status: Fetal Heart Rate (bpm): 140   Movement: Present  Presentation: Vertex  General:  Alert,  oriented and cooperative. Patient is in no acute distress.  Skin: Skin is warm and dry. No rash noted.   Cardiovascular: Normal heart rate noted  Respiratory: Normal respiratory effort, no problems with respiration noted  Abdomen: Soft, gravid, appropriate for gestational age. Pain/Pressure: Absent     Pelvic:  Cervical exam deferred        Extremities: Normal range of motion.     ental Status: Normal mood and affect. Normal behavior. Normal judgment and thought content.     Assessment   30 y.o. [id number] at [redacted]w[redacted]d by  04/11/2021, by Ultrasound presenting for work-in prenatal visit  Plan   PREGNANCY 7 Problems (from 08/28/20 to present)     Problem Noted Resolved   Obesity affecting pregnancy in third trimester 02/17/2021 by Tresea Mall, CNM No   Diabetes mellitus affecting pregnancy, unspecified trimester 09/19/2020 by Conard Novak, MD No   Supervision of high risk pregnancy, antepartum 09/04/2020 by Mirna Mires, CNM No   Overview Addendum 02/24/2021  2:44 PM by Natale Milch, MD     Nursing Staff Provider  Office Location  [facility] Dating   7wk [place]  Language  English Anatomy US   complete, normal, XY  Flu Vaccine   declined Genetic Screen  NIPS: normal xy  TDaP vaccine   Declines Hgb A1C or  GTT  Type 2 DM  Covid    LAB RESULTS   Rhogam   not needed Blood Type O/Positive/-- (12/17 1028)   Feeding Plan  Antibody Negative (12/17 1028)  Contraception  unsure- options reviewed 01/31/21 Rubella 1.76 (12/17 1028)  Circumcision  RPR Non Reactive (12/17 1028)   Pediatrician   HBsAg Negative (12/17 1028)   Support Person  HIV Non Reactive (12/17 1028)  Prenatal Classes  Varicella     GBS  (For PCN allergy, check sensitivities)   BTL Consent  n/a    VBAC Consent  hx of 2  LTCS Pap  08/28/20 -  NILM    Hgb Electro      CF       SMA        High risk pregnancy diagnoses:  Type 2 diabetes in pregnancy - was taking metformin but discontinued secondary to  hypoglycemia Obesity in pregnancy: pregravid BMI 36 History of 2 prior cesarean deliveries Tobacco use during pregnancy Seizure disorder Asthma       History of cesarean delivery, currently pregnant 10/12/2017 by Vena Austria, MD No   Overview Signed 09/19/2020  2:12 PM by Conard Novak, MD    Arrest of dilation at 7cm after 3 day IOL Baby 8#7oz Blood transfusion postpartum       Asthma affecting pregnancy, antepartum 05/10/2017 by Lady Deutscher, MD No   Seizure disorder during pregnancy, third trimester (HCC) 05/10/2017 by Lady Deutscher, MD No        Gestational age appropriate obstetric precautions including but not limited to vaginal bleeding, contractions, leaking of fluid and fetal movement were reviewed in detail with the patient.    Return if symptoms worsen or fail to improve, otherwise keep ROB.  Vena Austria, MD, Evern Core [facility], [facility] 03/14/2021, 3:04 PM

## 2021-03-14 NOTE — Discharge Summary (Signed)
  See FPN 

## 2021-03-14 NOTE — Final Progress Note (Signed)
Physician Final Progress Note  Patient ID: Faith [name] MRN: [id number] DOB/AGE: 1991/07/06 30 y.o.  Admit date: 03/14/2021 Admitting provider: Nadara Mustard, MD Discharge date: 03/14/2021  Admission Diagnoses: Lower abdominal pain pregnancy, 36 weeks  Discharge Diagnoses:   Same  Consults: None  Significant Findings/ Diagnostic Studies: Patient presented for evaluation of labor.  Patient had cervical exam by RN and this was reported to me. I reviewed her vital signs and fetal tracing, both of which were reassuring.  Patient was discharge as she was not laboring.  Procedures: A NST procedure was performed with FHR monitoring and a normal baseline established, appropriate time of 20-40 minutes of evaluation, and accels >2 seen w 15x15 characteristics.  Results show a REACTIVE NST.   Discharge Condition: good  Disposition: Discharge disposition: 01-Home or Self Care     Diet: Regular diet  Discharge Activity: Activity as tolerated  Total time spent taking care of this patient: TRIAGE Signed: Letitia Libra 03/14/2021, 5:35 AM

## 2021-03-14 NOTE — OB Triage Note (Signed)
Pt Faith [name] 30 y.o. presents to the ED complaining of contractions every 5-7 minutes, rates contraction pain 8/10. Pt is a [id number] at [redacted]w[redacted]d . Pt denies signs and symptoms consistent with rupture of membranes or active vaginal bleeding. However, she states that she observed some spotting yesterday and watery mucous discharge. Pt states positive fetal movement. External FM and TOCO applied to non-tender abdomen and assessing. Initial FHR 140. Vital signs obtained and within normal limits. Provider notified of pt.

## 2021-03-14 NOTE — Progress Notes (Signed)
RN spoke with patient at 38. Pt. States that she doesn't feel like her medical concerns are not urgent. She misunderstood the instructions from the provider's office regarding scheduling and location. She requested to be discharged now and to make an appointment with the provider's office during business hours.   RN spoke with MD at 320 to inform him of patient's cervical exam and request to be discharged. Provider communicated okay to discharge patient home and to instruct patient to make a followup appointment with the office.

## 2021-03-15 LAB — CULTURE, BETA STREP (GROUP B ONLY): Strep Gp B Culture: POSITIVE — AB

## 2021-03-15 LAB — FETAL FIBRONECTIN: Fetal Fibronectin: NEGATIVE

## 2021-03-18 ENCOUNTER — Telehealth: Payer: Self-pay

## 2021-03-18 NOTE — Telephone Encounter (Signed)
Called patient to schedule c-section w [facility]  DOS 6/30  H&P 6/21 @ 2:10   Covid testing 6/29 @ 8:05, [facility] [employer], [address]. Advised pt to mask until DOS.  Pre-admit phone call appointment 6/23 @ 1-5pm - date and time will also be included on H&P paper work. Also all appointments will be updated on pt MyChart. Explained that this appointment has a call window.   Advised that pt may also receive calls from the hospital pharmacy and pre-service center.  Confirmed pt has [employer] as primary insurance. No secondary insurance.

## 2021-03-20 ENCOUNTER — Ambulatory Visit: Payer: Medicaid Other | Admitting: *Deleted

## 2021-03-20 ENCOUNTER — Ambulatory Visit (INDEPENDENT_AMBULATORY_CARE_PROVIDER_SITE_OTHER): Payer: Medicaid Other | Admitting: Obstetrics and Gynecology

## 2021-03-20 ENCOUNTER — Other Ambulatory Visit: Payer: Self-pay

## 2021-03-20 ENCOUNTER — Encounter: Payer: Self-pay | Admitting: *Deleted

## 2021-03-20 ENCOUNTER — Ambulatory Visit: Payer: Medicaid Other | Attending: Obstetrics

## 2021-03-20 VITALS — BP 113/64 | HR 104

## 2021-03-20 VITALS — BP 136/76 | Ht 60.0 in | Wt 228.2 lb

## 2021-03-20 DIAGNOSIS — O0990 Supervision of high risk pregnancy, unspecified, unspecified trimester: Secondary | ICD-10-CM

## 2021-03-20 DIAGNOSIS — O0993 Supervision of high risk pregnancy, unspecified, third trimester: Secondary | ICD-10-CM | POA: Diagnosis not present

## 2021-03-20 DIAGNOSIS — J45909 Unspecified asthma, uncomplicated: Secondary | ICD-10-CM

## 2021-03-20 DIAGNOSIS — E669 Obesity, unspecified: Secondary | ICD-10-CM

## 2021-03-20 DIAGNOSIS — O99213 Obesity complicating pregnancy, third trimester: Secondary | ICD-10-CM | POA: Insufficient documentation

## 2021-03-20 DIAGNOSIS — O34219 Maternal care for unspecified type scar from previous cesarean delivery: Secondary | ICD-10-CM | POA: Diagnosis present

## 2021-03-20 DIAGNOSIS — G40909 Epilepsy, unspecified, not intractable, without status epilepticus: Secondary | ICD-10-CM | POA: Insufficient documentation

## 2021-03-20 DIAGNOSIS — O24113 Pre-existing diabetes mellitus, type 2, in pregnancy, third trimester: Secondary | ICD-10-CM | POA: Insufficient documentation

## 2021-03-20 DIAGNOSIS — Z3A36 36 weeks gestation of pregnancy: Secondary | ICD-10-CM

## 2021-03-20 DIAGNOSIS — E119 Type 2 diabetes mellitus without complications: Secondary | ICD-10-CM

## 2021-03-20 DIAGNOSIS — O24919 Unspecified diabetes mellitus in pregnancy, unspecified trimester: Secondary | ICD-10-CM | POA: Insufficient documentation

## 2021-03-20 DIAGNOSIS — F17210 Nicotine dependence, cigarettes, uncomplicated: Secondary | ICD-10-CM

## 2021-03-20 DIAGNOSIS — O99333 Smoking (tobacco) complicating pregnancy, third trimester: Secondary | ICD-10-CM

## 2021-03-20 DIAGNOSIS — O99353 Diseases of the nervous system complicating pregnancy, third trimester: Secondary | ICD-10-CM | POA: Diagnosis present

## 2021-03-20 DIAGNOSIS — O99519 Diseases of the respiratory system complicating pregnancy, unspecified trimester: Secondary | ICD-10-CM | POA: Diagnosis present

## 2021-03-20 LAB — POCT URINALYSIS DIPSTICK OB: Glucose, UA: NEGATIVE

## 2021-03-20 LAB — GLUCOSE, POCT (MANUAL RESULT ENTRY): POC Glucose: 109 mg/dl — AB (ref 70–99)

## 2021-03-21 ENCOUNTER — Telehealth: Payer: Self-pay

## 2021-03-21 NOTE — Telephone Encounter (Signed)
Called pt to adv that c-section date had been changed to 6/23 w [facility].  H&P still 6/21 at 2:10  PAT phone appt 6/21 @ 8am to 1pm  Covid testing 6/22 @ 8:20am  She understood.

## 2021-03-21 NOTE — Telephone Encounter (Signed)
-----   Message from Natale Milch, MD sent at 03/21/2021  2:06 AM EDT ----- Regarding: CESAREAN SECTION MOVED UP TO 6/23 Hello,  I had to move this cesarean section to 6/23 from 6/30. L&D is aware. Can you change it on the surgical schedule and her preop appointments?  Thank you,  Dr. Jerene Pitch

## 2021-03-21 NOTE — Progress Notes (Signed)
Routine Prenatal Care Visit  Subjective  Faith [name] is a 30 y.o. [id number] at [redacted]w[redacted]d being seen today for ongoing prenatal care.  She is currently monitored for the following issues for this high-risk pregnancy and has Status epilepticus (HCC); Diabetes mellitus type 2 in obese (HCC); Depression; Suicidal ideation; Post traumatic stress disorder (PTSD); ADD (attention deficit disorder); History of anemia; History of maternal blood transfusion, currently pregnant; Asthma affecting pregnancy, antepartum; Smoking (tobacco) complicating pregnancy, unspecified trimester; Seizure disorder during pregnancy, third trimester (HCC); Family history of Huntington's disease; History of cesarean delivery; Bipolar disorder (HCC); History of cesarean delivery, currently pregnant; Hidradenitis suppurativa; Supervision of high risk pregnancy, antepartum; Diabetes mellitus affecting pregnancy, unspecified trimester; Pregnancy related bilateral lower abdominal pain, antepartum; Obesity affecting pregnancy in third trimester; and Pregnancy on their problem list.  ----------------------------------------------------------------------------------- Patient reports no complaints.   Contractions: Irritability. Vag. Bleeding: None.  Movement: Present. Denies leaking of fluid.  ----------------------------------------------------------------------------------- The following portions of the patient's history were reviewed and updated as appropriate: allergies, current medications, past family history, past medical history, past social history, past surgical history and problem list. Problem list updated.   Objective  Blood pressure 136/76, height 5' (1.524 m), weight 228 lb 3.2 oz (103.5 kg). Pregravid weight 199 lb (90.3 kg) Total Weight Gain 29 lb 3.2 oz (13.2 kg) Urinalysis:      Fetal Status: Fetal Heart Rate (bpm): 135   Movement: Present     General:  Alert, oriented and cooperative. Patient is in no acute  distress.  Skin: Skin is warm and dry. No rash noted.   Cardiovascular: Normal heart rate noted  Respiratory: Normal respiratory effort, no problems with respiration noted  Abdomen: Soft, gravid, appropriate for gestational age. Pain/Pressure: Present     Pelvic:  Cervical exam deferred        Extremities: Normal range of motion.  Edema: None  Mental Status: Normal mood and affect. Normal behavior. Normal judgment and thought content.     Assessment   30 y.o. [id number] at [redacted]w[redacted]d by  04/11/2021, by Ultrasound presenting for routine prenatal visit  Plan   PREGNANCY 7 Problems (from 08/28/20 to present)     Problem Noted Resolved   Obesity affecting pregnancy in third trimester 02/17/2021 by Tresea Mall, CNM No   Diabetes mellitus affecting pregnancy, unspecified trimester 09/19/2020 by Conard Novak, MD No   Supervision of high risk pregnancy, antepartum 09/04/2020 by Mirna Mires, CNM No   Overview Addendum 02/24/2021  2:44 PM by Natale Milch, MD     Nursing Staff Provider  Office Location  [facility] Dating   7wk [place]  Language  English Anatomy US   complete, normal, XY  Flu Vaccine   declined Genetic Screen  NIPS: normal xy  TDaP vaccine   Declines Hgb A1C or  GTT  Type 2 DM  Covid    LAB RESULTS   Rhogam   not needed Blood Type O/Positive/-- (12/17 1028)   Feeding Plan  Antibody Negative (12/17 1028)  Contraception  unsure- options reviewed 01/31/21 Rubella 1.76 (12/17 1028)  Circumcision  RPR Non Reactive (12/17 1028)   Pediatrician   HBsAg Negative (12/17 1028)   Support Person  HIV Non Reactive (12/17 1028)  Prenatal Classes  Varicella     GBS  (For PCN allergy, check sensitivities)   BTL Consent  n/a    VBAC Consent  hx of 2  LTCS Pap  08/28/20 - NILM    Hgb Electro  CF       SMA        High risk pregnancy diagnoses:  Type 2 diabetes in pregnancy - was taking metformin but discontinued secondary to hypoglycemia Obesity in pregnancy: pregravid BMI  36 History of 2 prior cesarean deliveries Tobacco use during pregnancy Seizure disorder Asthma       History of cesarean delivery, currently pregnant 10/12/2017 by Vena Austria, MD No   Overview Signed 09/19/2020  2:12 PM by Conard Novak, MD    Arrest of dilation at 7cm after 3 day IOL Baby 8#7oz Blood transfusion postpartum       Asthma affecting pregnancy, antepartum 05/10/2017 by Lady Deutscher, MD No   Seizure disorder during pregnancy, third trimester (HCC) 05/10/2017 by Lady Deutscher, MD No       MFM suggested moving cesarean section to 37-38 weeks based on today's growth [place] and her poor glucose control. L&D called and section moved to 6/23. Note sent to scheduler.   NST: 135 bpm baseline, moderate variability, 15x15 accelerations, no decelerations.   Gestational age appropriate obstetric precautions including but not limited to vaginal bleeding, contractions, leaking of fluid and fetal movement were reviewed in detail with the patient.    Return for ROB as planned.  Natale Milch MD [facility], [facility] 03/21/2021, 2:05 AM

## 2021-03-25 ENCOUNTER — Ambulatory Visit (INDEPENDENT_AMBULATORY_CARE_PROVIDER_SITE_OTHER): Payer: Medicaid Other | Admitting: Obstetrics and Gynecology

## 2021-03-25 ENCOUNTER — Other Ambulatory Visit: Payer: Self-pay

## 2021-03-25 ENCOUNTER — Other Ambulatory Visit
Admission: RE | Admit: 2021-03-25 | Discharge: 2021-03-25 | Disposition: A | Discharge status: Home / Self Care | Payer: Medicaid Other | Source: Ambulatory Visit | Attending: Obstetrics and Gynecology | Admitting: Obstetrics and Gynecology

## 2021-03-25 ENCOUNTER — Encounter: Payer: Self-pay | Admitting: Obstetrics and Gynecology

## 2021-03-25 DIAGNOSIS — O0993 Supervision of high risk pregnancy, unspecified, third trimester: Secondary | ICD-10-CM

## 2021-03-25 DIAGNOSIS — O24919 Unspecified diabetes mellitus in pregnancy, unspecified trimester: Secondary | ICD-10-CM

## 2021-03-25 DIAGNOSIS — Z3A37 37 weeks gestation of pregnancy: Secondary | ICD-10-CM

## 2021-03-25 HISTORY — DX: COVID-19: U07.1

## 2021-03-25 NOTE — Progress Notes (Signed)
Faith [name] is an 30 y.o. female.   Chief Complaint: History of prior cesarean section x2 HPI: She presents today for a preoperative visit. Her repeat cesarean section is planned for 03/27/2021. She is feeling normal fetal movement. She denies bleeding. She denies contractions. She denies leakage of fluid. She reports normal fetal movement.  NST is reactive.    Her pregnancy has been complicated by diabetes mellitus. She has not been able to check blood glucose values at home. Her pregnancy has also been complicated by obesity, asthma, tobacco use, and history of seizure disorder.    PREGNANCY 7 Problems (from 08/28/20 to present)     Problem Noted Resolved   Obesity affecting pregnancy in third trimester 02/17/2021 by Tresea Mall, CNM No   Diabetes mellitus affecting pregnancy, unspecified trimester 09/19/2020 by Conard Novak, MD No   Supervision of high risk pregnancy, antepartum 09/04/2020 by Mirna Mires, CNM No   Overview Addendum 02/24/2021  2:44 PM by Natale Milch, MD     Nursing Staff Provider  Office Location  [facility] Dating   7wk [place]  Language  English Anatomy US   complete, normal, XY  Flu Vaccine   declined Genetic Screen  NIPS: normal xy  TDaP vaccine   Declines Hgb A1C or  GTT  Type 2 DM  Covid    LAB RESULTS   Rhogam   not needed Blood Type O/Positive/-- (12/17 1028)   Feeding Plan  Antibody Negative (12/17 1028)  Contraception  unsure- options reviewed 01/31/21 Rubella 1.76 (12/17 1028)  Circumcision  RPR Non Reactive (12/17 1028)   Pediatrician   HBsAg Negative (12/17 1028)   Support Person  HIV Non Reactive (12/17 1028)  Prenatal Classes  Varicella     GBS  (For PCN allergy, check sensitivities)   BTL Consent  n/a    VBAC Consent  hx of 2  LTCS Pap  08/28/20 - NILM    Hgb Electro      CF       SMA        High risk pregnancy diagnoses:  Type 2 diabetes in pregnancy - was taking metformin but discontinued secondary to hypoglycemia Obesity in  pregnancy: pregravid BMI 36 History of 2 prior cesarean deliveries Tobacco use during pregnancy Seizure disorder Asthma       History of cesarean delivery, currently pregnant 10/12/2017 by Vena Austria, MD No   Overview Signed 09/19/2020  2:12 PM by Conard Novak, MD    Arrest of dilation at 7cm after 3 day IOL Baby 8#7oz Blood transfusion postpartum       Asthma affecting pregnancy, antepartum 05/10/2017 by Lady Deutscher, MD No   Seizure disorder during pregnancy, third trimester (HCC) 05/10/2017 by Lady Deutscher, MD No        Past Medical History:  Diagnosis Date   ADHD    Anemia    BLOOD TRANSFUSION AFTER C-SECTION ON 06-2016-2 UNITS   Anxiety    Asthma    WELL CONTROLLED   Bipolar 1 disorder, depressed (HCC)    COVID-19    Depression    Diabetes mellitus without complication (HCC)    Gallstones    GERD (gastroesophageal reflux disease)    Heart murmur    History of blood transfusion 06/2016   RECEIVED 2 UNITS OF BLOOD AFTER C SECTION   Migraines    Ovarian cyst    Seizures (HCC)    LAST SEIZURE 2016   Tubal pregnancy  Past Surgical History:  Procedure Laterality Date   CESAREAN SECTION N/A 06/22/2016   Procedure: CESAREAN SECTION;  Surgeon: Vena Austria, MD;  Location: [facility];  Service: Obstetrics;  Laterality: N/A;   CESAREAN SECTION N/A 10/12/2017   Procedure: CESAREAN SECTION;  Surgeon: Vena Austria, MD;  Location: [facility];  Service: Obstetrics;  Laterality: N/A;   CHOLECYSTECTOMY N/A 08/13/2016   Procedure: LAPAROSCOPIC CHOLECYSTECTOMY;  Surgeon: Leafy Ro, MD;  Location: [facility];  Service: General;  Laterality: N/A;   DILATION AND CURETTAGE OF UTERUS     ovarian cyst removed     SALPINGECTOMY Right 2013   ectopic pregnancy. PER PATIENT, STILL HAS BOTH TUBES   TONSILLECTOMY     TONSILLECTOMY AND ADENOIDECTOMY      Family History  Problem Relation Age of Onset   Heart disease Father    Huntington's disease Mother    Social  History:  reports that she has been smoking cigarettes. She has a 3.50 pack-year smoking history. She has never used smokeless tobacco. She reports previous alcohol use. She reports that she does not use drugs.  Allergies:  Allergies  Allergen Reactions   Ivp Dye [Iodinated Diagnostic Agents] Anaphylaxis    Per patient, she does not have problems with betadine   Latex Rash   Metrizamide Anaphylaxis   Morphine Other (See Comments)    bradycradia    Mushroom Extract Complex Anaphylaxis    Hives then swelling of throat and can not breath   Sulfasalazine Hives   Tomato Anaphylaxis   Toradol [Ketorolac Tromethamine] Anaphylaxis, Hives and Swelling   Aspirin Hives   Dilaudid [Hydromorphone Hcl] Hives   Lamictal [Lamotrigine] Hives   Sulfa Antibiotics Hives   Tramadol Swelling, Rash and Hives   Adhesive [Tape] Itching    USE PAPER TAPE   Iohexol Rash    (contrast dye) causes red bumps    (Not in a hospital admission)   No results found for this or any previous visit (from the past 48 hour(s)). No results found.  Review of Systems  Constitutional:  Negative for chills and fever.  HENT:  Negative for congestion, hearing loss and sinus pain.   Respiratory:  Negative for cough, shortness of breath and wheezing.   Cardiovascular:  Negative for chest pain, palpitations and leg swelling.  Gastrointestinal:  Negative for abdominal pain, constipation, diarrhea, nausea and vomiting.  Genitourinary:  Negative for dysuria, flank pain, frequency, hematuria and urgency.  Musculoskeletal:  Negative for back pain.  Skin:  Negative for rash.  Neurological:  Negative for dizziness and headaches.  Psychiatric/Behavioral:  Negative for suicidal ideas. The patient is not nervous/anxious.    There were no vitals taken for this visit. Physical Exam Vitals and nursing note reviewed.  Constitutional:      Appearance: Normal appearance. She is well-developed.  HENT:     Head: Normocephalic and  atraumatic.  Cardiovascular:     Rate and Rhythm: Normal rate and regular rhythm.  Pulmonary:     Effort: Pulmonary effort is normal.     Breath sounds: Normal breath sounds.  Abdominal:     General: Bowel sounds are normal.     Palpations: Abdomen is soft.  Musculoskeletal:        General: Normal range of motion.  Skin:    General: Skin is warm and dry.  Neurological:     Mental Status: She is alert and oriented to person, place, and time.  Psychiatric:        Behavior:  Behavior normal.        Thought Content: Thought content normal.        Judgment: Judgment normal.     NST: 150 bpm baseline, moderate variability, 15x15 accelerations, no decelerations. Reactive   Assessment/Plan  30 y.o. W2H8527 at [redacted]w[redacted]d with history of prior cesarean section who desires an elective repeat cesarean section.   Discussed risks, benefits, and alternatives with the patient for the cesarean section.  Discussed the risk of infection bleeding and damage to surrounding pelvic tissues including but not limited to the uterus, fallopian tubes, ovaries, bowel, and bladder.  Discussed proper care for the incision postoperatively to avoid infection and signs and symptoms of infection to watch for.  Discussed possibility of an emergent blood transfusion for heavy blood loss.  Discussed risks associated with blood transfusion including transfusion reaction and chronic infections, or sepsis which could lead to death.  Patient gave consent for the procedure and blood transfusion if needed. Consent forms were completed.   Natale Milch, MD 03/25/2021, 3:28 PM

## 2021-03-25 NOTE — H&P (View-Only) (Signed)
Faith [name] is an 30 y.o. female.   Chief Complaint: History of prior cesarean section x2 HPI: She presents today for a preoperative visit. Her repeat cesarean section is planned for 03/27/2021. She is feeling normal fetal movement. She denies bleeding. She denies contractions. She denies leakage of fluid. She reports normal fetal movement.  NST is reactive.    Her pregnancy has been complicated by diabetes mellitus. She has not been able to check blood glucose values at home. Her pregnancy has also been complicated by obesity, asthma, tobacco use, and history of seizure disorder.    PREGNANCY 7 Problems (from 08/28/20 to present)     Problem Noted Resolved   Obesity affecting pregnancy in third trimester 02/17/2021 by Tresea Mall, CNM No   Diabetes mellitus affecting pregnancy, unspecified trimester 09/19/2020 by Conard Novak, MD No   Supervision of high risk pregnancy, antepartum 09/04/2020 by Mirna Mires, CNM No   Overview Addendum 02/24/2021  2:44 PM by Natale Milch, MD     Nursing Staff Provider  Office Location  [facility] Dating   7wk [place]  Language  English Anatomy US   complete, normal, XY  Flu Vaccine   declined Genetic Screen  NIPS: normal xy  TDaP vaccine   Declines Hgb A1C or  GTT  Type 2 DM  Covid    LAB RESULTS   Rhogam   not needed Blood Type O/Positive/-- (12/17 1028)   Feeding Plan  Antibody Negative (12/17 1028)  Contraception  unsure- options reviewed 01/31/21 Rubella 1.76 (12/17 1028)  Circumcision  RPR Non Reactive (12/17 1028)   Pediatrician   HBsAg Negative (12/17 1028)   Support Person  HIV Non Reactive (12/17 1028)  Prenatal Classes  Varicella     GBS  (For PCN allergy, check sensitivities)   BTL Consent  n/a    VBAC Consent  hx of 2  LTCS Pap  08/28/20 - NILM    Hgb Electro      CF       SMA        High risk pregnancy diagnoses:  Type 2 diabetes in pregnancy - was taking metformin but discontinued secondary to hypoglycemia Obesity in  pregnancy: pregravid BMI 36 History of 2 prior cesarean deliveries Tobacco use during pregnancy Seizure disorder Asthma       History of cesarean delivery, currently pregnant 10/12/2017 by Vena Austria, MD No   Overview Signed 09/19/2020  2:12 PM by Conard Novak, MD    Arrest of dilation at 7cm after 3 day IOL Baby 8#7oz Blood transfusion postpartum       Asthma affecting pregnancy, antepartum 05/10/2017 by Lady Deutscher, MD No   Seizure disorder during pregnancy, third trimester (HCC) 05/10/2017 by Lady Deutscher, MD No        Past Medical History:  Diagnosis Date   ADHD    Anemia    BLOOD TRANSFUSION AFTER C-SECTION ON 06-2016-2 UNITS   Anxiety    Asthma    WELL CONTROLLED   Bipolar 1 disorder, depressed (HCC)    COVID-19    Depression    Diabetes mellitus without complication (HCC)    Gallstones    GERD (gastroesophageal reflux disease)    Heart murmur    History of blood transfusion 06/2016   RECEIVED 2 UNITS OF BLOOD AFTER C SECTION   Migraines    Ovarian cyst    Seizures (HCC)    LAST SEIZURE 2016   Tubal pregnancy  Past Surgical History:  Procedure Laterality Date   CESAREAN SECTION N/A 06/22/2016   Procedure: CESAREAN SECTION;  Surgeon: Vena Austria, MD;  Location: [facility];  Service: Obstetrics;  Laterality: N/A;   CESAREAN SECTION N/A 10/12/2017   Procedure: CESAREAN SECTION;  Surgeon: Vena Austria, MD;  Location: [facility];  Service: Obstetrics;  Laterality: N/A;   CHOLECYSTECTOMY N/A 08/13/2016   Procedure: LAPAROSCOPIC CHOLECYSTECTOMY;  Surgeon: Leafy Ro, MD;  Location: [facility];  Service: General;  Laterality: N/A;   DILATION AND CURETTAGE OF UTERUS     ovarian cyst removed     SALPINGECTOMY Right 2013   ectopic pregnancy. PER PATIENT, STILL HAS BOTH TUBES   TONSILLECTOMY     TONSILLECTOMY AND ADENOIDECTOMY      Family History  Problem Relation Age of Onset   Heart disease Father    Huntington's disease Mother    Social  History:  reports that she has been smoking cigarettes. She has a 3.50 pack-year smoking history. She has never used smokeless tobacco. She reports previous alcohol use. She reports that she does not use drugs.  Allergies:  Allergies  Allergen Reactions   Ivp Dye [Iodinated Diagnostic Agents] Anaphylaxis    Per patient, she does not have problems with betadine   Latex Rash   Metrizamide Anaphylaxis   Morphine Other (See Comments)    bradycradia    Mushroom Extract Complex Anaphylaxis    Hives then swelling of throat and can not breath   Sulfasalazine Hives   Tomato Anaphylaxis   Toradol [Ketorolac Tromethamine] Anaphylaxis, Hives and Swelling   Aspirin Hives   Dilaudid [Hydromorphone Hcl] Hives   Lamictal [Lamotrigine] Hives   Sulfa Antibiotics Hives   Tramadol Swelling, Rash and Hives   Adhesive [Tape] Itching    USE PAPER TAPE   Iohexol Rash    (contrast dye) causes red bumps    (Not in a hospital admission)   No results found for this or any previous visit (from the past 48 hour(s)). No results found.  Review of Systems  Constitutional:  Negative for chills and fever.  HENT:  Negative for congestion, hearing loss and sinus pain.   Respiratory:  Negative for cough, shortness of breath and wheezing.   Cardiovascular:  Negative for chest pain, palpitations and leg swelling.  Gastrointestinal:  Negative for abdominal pain, constipation, diarrhea, nausea and vomiting.  Genitourinary:  Negative for dysuria, flank pain, frequency, hematuria and urgency.  Musculoskeletal:  Negative for back pain.  Skin:  Negative for rash.  Neurological:  Negative for dizziness and headaches.  Psychiatric/Behavioral:  Negative for suicidal ideas. The patient is not nervous/anxious.    There were no vitals taken for this visit. Physical Exam Vitals and nursing note reviewed.  Constitutional:      Appearance: Normal appearance. She is well-developed.  HENT:     Head: Normocephalic and  atraumatic.  Cardiovascular:     Rate and Rhythm: Normal rate and regular rhythm.  Pulmonary:     Effort: Pulmonary effort is normal.     Breath sounds: Normal breath sounds.  Abdominal:     General: Bowel sounds are normal.     Palpations: Abdomen is soft.  Musculoskeletal:        General: Normal range of motion.  Skin:    General: Skin is warm and dry.  Neurological:     Mental Status: She is alert and oriented to person, place, and time.  Psychiatric:        Behavior:  Behavior normal.        Thought Content: Thought content normal.        Judgment: Judgment normal.     NST: 150 bpm baseline, moderate variability, 15x15 accelerations, no decelerations. Reactive   Assessment/Plan  30 y.o. W2H8527 at [redacted]w[redacted]d with history of prior cesarean section who desires an elective repeat cesarean section.   Discussed risks, benefits, and alternatives with the patient for the cesarean section.  Discussed the risk of infection bleeding and damage to surrounding pelvic tissues including but not limited to the uterus, fallopian tubes, ovaries, bowel, and bladder.  Discussed proper care for the incision postoperatively to avoid infection and signs and symptoms of infection to watch for.  Discussed possibility of an emergent blood transfusion for heavy blood loss.  Discussed risks associated with blood transfusion including transfusion reaction and chronic infections, or sepsis which could lead to death.  Patient gave consent for the procedure and blood transfusion if needed. Consent forms were completed.   Natale Milch, MD 03/25/2021, 3:28 PM

## 2021-03-25 NOTE — Patient Instructions (Addendum)
Your procedure is scheduled on: 03/27/21 - Thursday Report to the Registration Desk on the [facility]st floor [facility]. Arrival time 0615 am. Report To [facility] on 03/26/21 at 8:30 am for Labs, EKG, Bag and Covid Test.  REMEMBER: Instructions that are not followed completely may result in serious medical risk, up to and including death; or upon the discretion of your surgeon and anesthesiologist your surgery may need to be rescheduled.  Do not eat food or drink any Fluids after midnight the night before surgery.  No gum chewing, lozengers or hard candies.  TAKE THESE MEDICATIONS THE MORNING OF SURGERY WITH A SIP OF WATER: - alprazolam (XANAX) 2 MG tablet  One week prior to surgery: Stop Anti-inflammatories (NSAIDS) such as Advil, Aleve, Ibuprofen, Motrin, Naproxen, Naprosyn and Aspirin based products such as Excedrin, Goodys Powder, BC Powder.  Stop ANY OVER THE COUNTER supplements until after surgery.  You may take Tylenol if needed for pain up until the day of surgery.  No Alcohol for 24 hours before or after surgery.  No Smoking including e-cigarettes for 24 hours prior to surgery.  No chewable tobacco products for at least 6 hours prior to surgery.  No nicotine patches on the day of surgery.  Do not use any "recreational" drugs for at least a week prior to your surgery.  Please be advised that the combination of cocaine and anesthesia may have negative outcomes, up to and including death. If you test positive for cocaine, your surgery will be cancelled.  On the morning of surgery brush your teeth with toothpaste and water, you may rinse your mouth with mouthwash if you wish. Do not swallow any toothpaste or mouthwash.  Do not wear jewelry, make-up, hairpins, clips or nail polish.  Do not wear lotions, powders, or perfumes.   Do not shave body from the neck down 48 hours prior to surgery just in case you cut yourself which could leave a site for infection.  Also,  freshly shaved skin may become irritated if using the CHG soap.  Contact lenses, hearing aids and dentures may not be worn into surgery.  Do not bring valuables to the hospital. [facility] is not responsible for any missing/lost belongings or valuables.   Use CHG Soap or wipes as directed on instruction sheet.  Notify your doctor if there is any change in your medical condition (cold, fever, infection).  Wear comfortable clothing (specific to your surgery type) to the hospital.  After surgery, you can help prevent lung complications by doing breathing exercises.  Take deep breaths and cough every 1-2 hours. Your doctor may order a device called an Incentive Spirometer to help you take deep breaths. When coughing or sneezing, hold a pillow firmly against your incision with both hands. This is called "splinting." Doing this helps protect your incision. It also decreases belly discomfort.  If you are being admitted to the hospital overnight, leave your suitcase in the car. After surgery it may be brought to your room.  If you are being discharged the day of surgery, you will not be allowed to drive home. You will need a responsible adult (18 years or older) to drive you home and stay with you that night.   If you are taking public transportation, you will need to have a responsible adult (18 years or older) with you. Please confirm with your physician that it is acceptable to use public transportation.   Please call the Pre-admissions Testing Dept. at [phone]  if you have any questions about these instructions.  Surgery Visitation Policy:  Patients undergoing a surgery or procedure may have one family member or support person with them as long as that person is not COVID-19 positive or experiencing its symptoms.  That person may remain in the waiting area during the procedure.  Inpatient Visitation:    Visiting hours are 7 a.m. to 8 p.m. Inpatients will be allowed two visitors  daily. The visitors may change each day during the patient's stay. No visitors under the age of 85. Any visitor under the age of 80 must be accompanied by an adult. The visitor must pass COVID-19 screenings, use hand sanitizer when entering and exiting the patient's room and wear a mask at all times, including in the patient's room. Patients must also wear a mask when staff or their visitor are in the room. Masking is required regardless of vaccination status.

## 2021-03-26 ENCOUNTER — Other Ambulatory Visit
Admission: RE | Admit: 2021-03-26 | Discharge: 2021-03-26 | Disposition: A | Discharge status: Home / Self Care | Payer: Medicaid Other | Source: Ambulatory Visit | Attending: Obstetrics and Gynecology | Admitting: Obstetrics and Gynecology

## 2021-03-26 ENCOUNTER — Encounter: Payer: Self-pay | Admitting: *Deleted

## 2021-03-26 ENCOUNTER — Ambulatory Visit: Payer: Medicaid Other | Admitting: *Deleted

## 2021-03-26 ENCOUNTER — Ambulatory Visit: Payer: Medicaid Other | Attending: Obstetrics

## 2021-03-26 VITALS — BP 114/63 | HR 91

## 2021-03-26 DIAGNOSIS — O0990 Supervision of high risk pregnancy, unspecified, unspecified trimester: Secondary | ICD-10-CM | POA: Insufficient documentation

## 2021-03-26 DIAGNOSIS — O99519 Diseases of the respiratory system complicating pregnancy, unspecified trimester: Secondary | ICD-10-CM | POA: Diagnosis present

## 2021-03-26 DIAGNOSIS — G40909 Epilepsy, unspecified, not intractable, without status epilepticus: Secondary | ICD-10-CM

## 2021-03-26 DIAGNOSIS — O24919 Unspecified diabetes mellitus in pregnancy, unspecified trimester: Secondary | ICD-10-CM

## 2021-03-26 DIAGNOSIS — O99353 Diseases of the nervous system complicating pregnancy, third trimester: Secondary | ICD-10-CM | POA: Diagnosis present

## 2021-03-26 DIAGNOSIS — E669 Obesity, unspecified: Secondary | ICD-10-CM

## 2021-03-26 DIAGNOSIS — Z01810 Encounter for preprocedural cardiovascular examination: Secondary | ICD-10-CM

## 2021-03-26 DIAGNOSIS — F17210 Nicotine dependence, cigarettes, uncomplicated: Secondary | ICD-10-CM | POA: Diagnosis not present

## 2021-03-26 DIAGNOSIS — O34219 Maternal care for unspecified type scar from previous cesarean delivery: Secondary | ICD-10-CM

## 2021-03-26 DIAGNOSIS — O99213 Obesity complicating pregnancy, third trimester: Secondary | ICD-10-CM

## 2021-03-26 DIAGNOSIS — O24113 Pre-existing diabetes mellitus, type 2, in pregnancy, third trimester: Secondary | ICD-10-CM | POA: Insufficient documentation

## 2021-03-26 DIAGNOSIS — O99333 Smoking (tobacco) complicating pregnancy, third trimester: Secondary | ICD-10-CM | POA: Diagnosis not present

## 2021-03-26 DIAGNOSIS — E119 Type 2 diabetes mellitus without complications: Secondary | ICD-10-CM

## 2021-03-26 DIAGNOSIS — Z3A37 37 weeks gestation of pregnancy: Secondary | ICD-10-CM

## 2021-03-26 DIAGNOSIS — Z01818 Encounter for other preprocedural examination: Secondary | ICD-10-CM | POA: Insufficient documentation

## 2021-03-26 DIAGNOSIS — Z20822 Contact with and (suspected) exposure to covid-19: Secondary | ICD-10-CM | POA: Insufficient documentation

## 2021-03-26 DIAGNOSIS — J45909 Unspecified asthma, uncomplicated: Secondary | ICD-10-CM | POA: Diagnosis present

## 2021-03-26 LAB — CBC
HCT: 30.9 % — ABNORMAL LOW (ref 36.0–46.0)
Hemoglobin: 9.2 g/dL — ABNORMAL LOW (ref 12.0–15.0)
MCH: 20.6 pg — ABNORMAL LOW (ref 26.0–34.0)
MCHC: 29.8 g/dL — ABNORMAL LOW (ref 30.0–36.0)
MCV: 69.1 fL — ABNORMAL LOW (ref 80.0–100.0)
Platelets: 333 10*3/uL (ref 150–400)
RBC: 4.47 MIL/uL (ref 3.87–5.11)
RDW: 18.4 % — ABNORMAL HIGH (ref 11.5–15.5)
WBC: 15.1 10*3/uL — ABNORMAL HIGH (ref 4.0–10.5)
nRBC: 0 % (ref 0.0–0.2)

## 2021-03-26 LAB — TYPE AND SCREEN
ABO/RH(D): O POS
Antibody Screen: NEGATIVE
Extend sample reason: UNDETERMINED

## 2021-03-26 LAB — BASIC METABOLIC PANEL
Anion gap: 9 (ref 5–15)
BUN: 6 mg/dL (ref 6–20)
CO2: 21 mmol/L — ABNORMAL LOW (ref 22–32)
Calcium: 8.4 mg/dL — ABNORMAL LOW (ref 8.9–10.3)
Chloride: 104 mmol/L (ref 98–111)
Creatinine, Ser: 0.52 mg/dL (ref 0.44–1.00)
GFR, Estimated: >60 mL/min (ref >60)
Glucose, Bld: 153 mg/dL — ABNORMAL HIGH (ref 70–99)
Potassium: 3.1 mmol/L — ABNORMAL LOW (ref 3.5–5.1)
Sodium: 134 mmol/L — ABNORMAL LOW (ref 135–145)

## 2021-03-26 LAB — OB RESULTS CONSOLE HIV ANTIBODY (ROUTINE TESTING): HIV: NONREACTIVE

## 2021-03-26 LAB — RPR: RPR Ser Ql: NONREACTIVE

## 2021-03-26 LAB — SARS CORONAVIRUS 2 (TAT 6-24 HRS): SARS Coronavirus 2: NEGATIVE

## 2021-03-26 LAB — RAPID HIV SCREEN (HIV 1/2 AB+AG)
HIV 1/2 Antibodies: NONREACTIVE
HIV-1 P24 Antigen - HIV24: NONREACTIVE

## 2021-03-26 MED ORDER — CEFAZOLIN SODIUM-DEXTROSE 2-4 GM/100ML-% IV SOLN
2.0000 g | INTRAVENOUS | Status: AC
  Administered 2021-03-27: 2 g via INTRAVENOUS
  Filled 2021-03-26: qty 100

## 2021-03-26 MED ORDER — FAMOTIDINE 20 MG PO TABS
20.0000 mg | ORAL_TABLET | Freq: Once | ORAL | Status: AC
  Administered 2021-03-27: 20 mg via ORAL
  Filled 2021-03-26: qty 1

## 2021-03-26 MED ORDER — BUPIVACAINE 0.25 % ON-Q PUMP DUAL CATH 400 ML
400.0000 mL | INJECTION | Status: DC
  Filled 2021-03-26: qty 400

## 2021-03-26 NOTE — Progress Notes (Signed)
  [facility] Perioperative Services: Pre-Admission/Anesthesia Testing  Abnormal Lab Notification   Date: 03/26/21  Name: Faith [name] MRN:   [id number]  Re: Abnormal labs noted during PAT appointment   Provider(s) Notified: Natale Milch, MD Notification mode: Routed and/or faxed via [facility]   ABNORMAL LAB VALUE(S): Lab Results  Component Value Date   K 3.1 (L) 03/26/2021   Notes:  Patient is scheduled for a CESAREAN SECTION on 03/27/2021. In review of patient's medication reconciliation, she is not noted to be on any type of daily diuretic therapies. Will send to primary attending surgeon for review and optimization. Order enter to have K+ rechecked on the day of her procedure to ensure that we are at a safe level to proceed.   This is a Personal assistant; no formal response is required.  Quentin Mulling, MSN, APRN, FNP-C, CEN [facility]  Peri-operative Services Nurse Practitioner Phone: [phone] Fax: [phone] 03/26/21 10:57 AM

## 2021-03-27 ENCOUNTER — Inpatient Hospital Stay: Admission: RE | Admit: 2021-03-27 | Payer: Medicaid Other | Source: Ambulatory Visit

## 2021-03-27 ENCOUNTER — Encounter: Payer: Self-pay | Admitting: Obstetrics and Gynecology

## 2021-03-27 ENCOUNTER — Inpatient Hospital Stay: Payer: Medicaid Other | Admitting: Urgent Care

## 2021-03-27 ENCOUNTER — Other Ambulatory Visit: Payer: Self-pay

## 2021-03-27 ENCOUNTER — Inpatient Hospital Stay
Admission: RE | Admit: 2021-03-27 | Discharge: 2021-03-29 | DRG: 786 | Disposition: A | Discharge status: Home / Self Care | Payer: Medicaid Other | Attending: Obstetrics and Gynecology | Admitting: Obstetrics and Gynecology

## 2021-03-27 ENCOUNTER — Encounter
Admission: RE | Disposition: A | Discharge status: Home / Self Care | Payer: Self-pay | Source: Home / Self Care | Attending: Obstetrics and Gynecology

## 2021-03-27 DIAGNOSIS — E1169 Type 2 diabetes mellitus with other specified complication: Secondary | ICD-10-CM | POA: Diagnosis present

## 2021-03-27 DIAGNOSIS — O34219 Maternal care for unspecified type scar from previous cesarean delivery: Secondary | ICD-10-CM

## 2021-03-27 DIAGNOSIS — O9952 Diseases of the respiratory system complicating childbirth: Secondary | ICD-10-CM | POA: Diagnosis present

## 2021-03-27 DIAGNOSIS — Z3A37 37 weeks gestation of pregnancy: Secondary | ICD-10-CM

## 2021-03-27 DIAGNOSIS — O0990 Supervision of high risk pregnancy, unspecified, unspecified trimester: Secondary | ICD-10-CM

## 2021-03-27 DIAGNOSIS — O99213 Obesity complicating pregnancy, third trimester: Secondary | ICD-10-CM

## 2021-03-27 DIAGNOSIS — O9081 Anemia of the puerperium: Secondary | ICD-10-CM | POA: Diagnosis not present

## 2021-03-27 DIAGNOSIS — O24429 Gestational diabetes mellitus in childbirth, unspecified control: Secondary | ICD-10-CM | POA: Diagnosis not present

## 2021-03-27 DIAGNOSIS — F17210 Nicotine dependence, cigarettes, uncomplicated: Secondary | ICD-10-CM | POA: Diagnosis present

## 2021-03-27 DIAGNOSIS — Z20822 Contact with and (suspected) exposure to covid-19: Secondary | ICD-10-CM | POA: Diagnosis present

## 2021-03-27 DIAGNOSIS — Z98891 History of uterine scar from previous surgery: Secondary | ICD-10-CM

## 2021-03-27 DIAGNOSIS — O34211 Maternal care for low transverse scar from previous cesarean delivery: Secondary | ICD-10-CM | POA: Diagnosis present

## 2021-03-27 DIAGNOSIS — E669 Obesity, unspecified: Secondary | ICD-10-CM | POA: Diagnosis present

## 2021-03-27 DIAGNOSIS — E11649 Type 2 diabetes mellitus with hypoglycemia without coma: Secondary | ICD-10-CM | POA: Diagnosis present

## 2021-03-27 DIAGNOSIS — J45909 Unspecified asthma, uncomplicated: Secondary | ICD-10-CM | POA: Diagnosis present

## 2021-03-27 DIAGNOSIS — O24919 Unspecified diabetes mellitus in pregnancy, unspecified trimester: Secondary | ICD-10-CM

## 2021-03-27 DIAGNOSIS — O99519 Diseases of the respiratory system complicating pregnancy, unspecified trimester: Secondary | ICD-10-CM

## 2021-03-27 DIAGNOSIS — O2412 Pre-existing diabetes mellitus, type 2, in childbirth: Secondary | ICD-10-CM | POA: Diagnosis present

## 2021-03-27 DIAGNOSIS — O99334 Smoking (tobacco) complicating childbirth: Secondary | ICD-10-CM | POA: Diagnosis present

## 2021-03-27 DIAGNOSIS — D62 Acute posthemorrhagic anemia: Secondary | ICD-10-CM | POA: Diagnosis not present

## 2021-03-27 DIAGNOSIS — G40909 Epilepsy, unspecified, not intractable, without status epilepticus: Secondary | ICD-10-CM

## 2021-03-27 DIAGNOSIS — O99214 Obesity complicating childbirth: Secondary | ICD-10-CM | POA: Diagnosis present

## 2021-03-27 LAB — CBC WITH DIFFERENTIAL/PLATELET
Abs Immature Granulocytes: 0.33 10*3/uL — ABNORMAL HIGH (ref 0.00–0.07)
Basophils Absolute: 0.1 10*3/uL (ref 0.0–0.1)
Basophils Relative: 1 %
Eosinophils Absolute: 0.2 10*3/uL (ref 0.0–0.5)
Eosinophils Relative: 2 %
HCT: 32.3 % — ABNORMAL LOW (ref 36.0–46.0)
Hemoglobin: 9.9 g/dL — ABNORMAL LOW (ref 12.0–15.0)
Immature Granulocytes: 2 %
Lymphocytes Relative: 18 %
Lymphs Abs: 2.7 10*3/uL (ref 0.7–4.0)
MCH: 21.1 pg — ABNORMAL LOW (ref 26.0–34.0)
MCHC: 30.7 g/dL (ref 30.0–36.0)
MCV: 68.7 fL — ABNORMAL LOW (ref 80.0–100.0)
Monocytes Absolute: 0.9 10*3/uL (ref 0.1–1.0)
Monocytes Relative: 6 %
Neutro Abs: 11.2 10*3/uL — ABNORMAL HIGH (ref 1.7–7.7)
Neutrophils Relative %: 71 %
Platelets: 349 10*3/uL (ref 150–400)
RBC: 4.7 MIL/uL (ref 3.87–5.11)
RDW: 19 % — ABNORMAL HIGH (ref 11.5–15.5)
Smear Review: NORMAL
WBC: 15.4 10*3/uL — ABNORMAL HIGH (ref 4.0–10.5)
nRBC: 0 % (ref 0.0–0.2)

## 2021-03-27 LAB — GLUCOSE, CAPILLARY
Glucose-Capillary: 90 mg/dL (ref 70–99)
Glucose-Capillary: 88 mg/dL (ref 70–99)
Glucose-Capillary: 95 mg/dL (ref 70–99)
Glucose-Capillary: 117 mg/dL — ABNORMAL HIGH (ref 70–99)

## 2021-03-27 LAB — POTASSIUM: Potassium: 3.9 mmol/L (ref 3.5–5.1)

## 2021-03-27 SURGERY — Surgical Case
Anesthesia: Spinal

## 2021-03-27 MED ORDER — SIMETHICONE 80 MG PO CHEW
80.0000 mg | CHEWABLE_TABLET | Freq: Three times a day (TID) | ORAL | Status: DC
  Administered 2021-03-27 – 2021-03-29 (×7): 80 mg via ORAL
  Filled 2021-03-27 (×6): qty 1

## 2021-03-27 MED ORDER — OXYTOCIN-SODIUM CHLORIDE 30-0.9 UT/500ML-% IV SOLN
INTRAVENOUS | Status: DC | PRN
  Administered 2021-03-27: 600 mL/h via INTRAVENOUS

## 2021-03-27 MED ORDER — PROPOFOL 10 MG/ML IV BOLUS
INTRAVENOUS | Status: AC
  Filled 2021-03-27: qty 20

## 2021-03-27 MED ORDER — LACTATED RINGERS IV SOLN: INTRAVENOUS | Status: DC | PRN

## 2021-03-27 MED ORDER — SENNOSIDES-DOCUSATE SODIUM 8.6-50 MG PO TABS
2.0000 | ORAL_TABLET | ORAL | Status: DC
  Administered 2021-03-27 – 2021-03-28 (×2): 2 via ORAL
  Filled 2021-03-27 (×2): qty 2

## 2021-03-27 MED ORDER — BUPIVACAINE HCL (PF) 0.5 % IJ SOLN
INTRAMUSCULAR | Status: AC
  Filled 2021-03-27: qty 30

## 2021-03-27 MED ORDER — PHENYLEPHRINE HCL (PRESSORS) 10 MG/ML IV SOLN
INTRAVENOUS | Status: DC | PRN
  Administered 2021-03-27 (×2): 100 ug via INTRAVENOUS

## 2021-03-27 MED ORDER — DIPHENHYDRAMINE HCL 25 MG PO CAPS: 25.0000 mg | ORAL_CAPSULE | Freq: Four times a day (QID) | ORAL | Status: DC | PRN

## 2021-03-27 MED ORDER — MENTHOL 3 MG MT LOZG
1.0000 | LOZENGE | OROMUCOSAL | Status: DC | PRN
  Filled 2021-03-27: qty 9

## 2021-03-27 MED ORDER — DIPHENHYDRAMINE HCL 50 MG/ML IJ SOLN
INTRAMUSCULAR | Status: AC
  Filled 2021-03-27: qty 1

## 2021-03-27 MED ORDER — INSULIN ASPART 100 UNIT/ML IJ SOLN
0.0000 [IU] | Freq: Three times a day (TID) | INTRAMUSCULAR | Status: DC
  Filled 2021-03-27: qty 1

## 2021-03-27 MED ORDER — ONDANSETRON HCL 4 MG/2ML IJ SOLN
INTRAMUSCULAR | Status: AC
  Filled 2021-03-27: qty 2

## 2021-03-27 MED ORDER — DIPHENHYDRAMINE HCL 50 MG/ML IJ SOLN
INTRAMUSCULAR | Status: DC | PRN
  Administered 2021-03-27: 25 mg via INTRAVENOUS

## 2021-03-27 MED ORDER — SOD CITRATE-CITRIC ACID 500-334 MG/5ML PO SOLN
ORAL | Status: AC
  Filled 2021-03-27: qty 15

## 2021-03-27 MED ORDER — FENTANYL CITRATE (PF) 100 MCG/2ML IJ SOLN
INTRAMUSCULAR | Status: AC
  Filled 2021-03-27: qty 2

## 2021-03-27 MED ORDER — FLEET ENEMA 7-19 GM/118ML RE ENEM: 1.0000 | ENEMA | Freq: Every day | RECTAL | Status: DC | PRN

## 2021-03-27 MED ORDER — OXYTOCIN-SODIUM CHLORIDE 30-0.9 UT/500ML-% IV SOLN
2.5000 [IU]/h | INTRAVENOUS | Status: AC
  Administered 2021-03-27: 2.5 [IU]/h via INTRAVENOUS
  Filled 2021-03-27: qty 500

## 2021-03-27 MED ORDER — ZOLPIDEM TARTRATE 5 MG PO TABS: 5.0000 mg | ORAL_TABLET | Freq: Every evening | ORAL | Status: DC | PRN

## 2021-03-27 MED ORDER — DIBUCAINE (PERIANAL) 1 % EX OINT: 1.0000 "application " | TOPICAL_OINTMENT | CUTANEOUS | Status: DC | PRN

## 2021-03-27 MED ORDER — BUPIVACAINE ON-Q PAIN PUMP (FOR ORDER SET NO CHG): INJECTION | Status: DC

## 2021-03-27 MED ORDER — WITCH HAZEL-GLYCERIN EX PADS: 1.0000 "application " | MEDICATED_PAD | CUTANEOUS | Status: DC | PRN

## 2021-03-27 MED ORDER — CHLORHEXIDINE GLUCONATE 0.12 % MT SOLN
15.0000 mL | Freq: Once | OROMUCOSAL | Status: AC
  Administered 2021-03-27: 15 mL via OROMUCOSAL
  Filled 2021-03-27: qty 15

## 2021-03-27 MED ORDER — OXYTOCIN-SODIUM CHLORIDE 30-0.9 UT/500ML-% IV SOLN
INTRAVENOUS | Status: AC
  Filled 2021-03-27: qty 1000

## 2021-03-27 MED ORDER — SODIUM CHLORIDE 0.9 % IV SOLN
INTRAVENOUS | Status: DC | PRN
  Administered 2021-03-27: 40 ug/min via INTRAVENOUS

## 2021-03-27 MED ORDER — SOD CITRATE-CITRIC ACID 500-334 MG/5ML PO SOLN
30.0000 mL | ORAL | Status: AC
Start: 2021-03-27 — End: 2021-03-27
  Administered 2021-03-27: 30 mL via ORAL

## 2021-03-27 MED ORDER — COCONUT OIL OIL: 1.0000 "application " | TOPICAL_OIL | Status: DC | PRN

## 2021-03-27 MED ORDER — SIMETHICONE 80 MG PO CHEW
80.0000 mg | CHEWABLE_TABLET | ORAL | Status: DC | PRN
  Administered 2021-03-28: 80 mg via ORAL
  Filled 2021-03-27 (×2): qty 1

## 2021-03-27 MED ORDER — BISACODYL 10 MG RE SUPP: 10.0000 mg | Freq: Every day | RECTAL | Status: DC | PRN

## 2021-03-27 MED ORDER — BUPIVACAINE IN DEXTROSE 0.75-8.25 % IT SOLN
INTRATHECAL | Status: DC | PRN
  Administered 2021-03-27: 1.6 mL via INTRATHECAL

## 2021-03-27 MED ORDER — BUPIVACAINE HCL (PF) 0.5 % IJ SOLN
INTRAMUSCULAR | Status: DC | PRN
  Administered 2021-03-27: 10 mL

## 2021-03-27 MED ORDER — PRENATAL MULTIVITAMIN CH
1.0000 | ORAL_TABLET | Freq: Every day | ORAL | Status: DC
  Administered 2021-03-27 – 2021-03-28 (×2): 1 via ORAL
  Filled 2021-03-27 (×2): qty 1

## 2021-03-27 MED ORDER — OXYCODONE-ACETAMINOPHEN 5-325 MG PO TABS
1.0000 | ORAL_TABLET | Freq: Four times a day (QID) | ORAL | Status: DC | PRN
  Administered 2021-03-27 – 2021-03-28 (×4): 2 via ORAL
  Administered 2021-03-29: 1 via ORAL
  Filled 2021-03-27: qty 1
  Filled 2021-03-27 (×4): qty 2

## 2021-03-27 MED ORDER — IBUPROFEN 600 MG PO TABS
600.0000 mg | ORAL_TABLET | Freq: Four times a day (QID) | ORAL | Status: DC
  Administered 2021-03-27 – 2021-03-29 (×6): 600 mg via ORAL
  Filled 2021-03-27 (×8): qty 1

## 2021-03-27 MED ORDER — ORAL CARE MOUTH RINSE: 15.0000 mL | Freq: Once | OROMUCOSAL | Status: AC

## 2021-03-27 MED ORDER — PHENYLEPHRINE HCL (PRESSORS) 10 MG/ML IV SOLN
INTRAVENOUS | Status: AC
  Filled 2021-03-27: qty 1

## 2021-03-27 MED ORDER — ONDANSETRON HCL 4 MG/2ML IJ SOLN
INTRAMUSCULAR | Status: DC | PRN
  Administered 2021-03-27: 4 mg via INTRAVENOUS

## 2021-03-27 MED ORDER — LACTATED RINGERS IV SOLN: INTRAVENOUS | Status: DC

## 2021-03-27 MED ORDER — FENTANYL CITRATE (PF) 100 MCG/2ML IJ SOLN
INTRAMUSCULAR | Status: DC | PRN
  Administered 2021-03-27: 30 ug via INTRAVENOUS
  Administered 2021-03-27: 25 ug via INTRAVENOUS
  Administered 2021-03-27: 20 ug via INTRAVENOUS

## 2021-03-27 MED ORDER — FERROUS SULFATE 325 (65 FE) MG PO TABS
325.0000 mg | ORAL_TABLET | Freq: Two times a day (BID) | ORAL | Status: DC
  Administered 2021-03-27 – 2021-03-29 (×4): 325 mg via ORAL
  Filled 2021-03-27 (×4): qty 1

## 2021-03-27 SURGICAL SUPPLY — 36 items
ADH SKN CLS APL DERMABOND .7 (GAUZE/BANDAGES/DRESSINGS) ×1
APL PRP STRL LF DISP 70% ISPRP (MISCELLANEOUS) ×2
BACTOSHIELD CHG 4% 4OZ (MISCELLANEOUS) ×2
CATH KIT ON-Q SILVERSOAK 5 (CATHETERS) IMPLANT
CATH KIT ON-Q SILVERSOAK 5IN (CATHETERS) ×6 IMPLANT
CHLORAPREP W/TINT 26 (MISCELLANEOUS) ×6 IMPLANT
DERMABOND ADVANCED (GAUZE/BANDAGES/DRESSINGS) ×2
DERMABOND ADVANCED .7 DNX12 (GAUZE/BANDAGES/DRESSINGS) ×1 IMPLANT
DRESSING SURGICEL FIBRLLR 1X2 (HEMOSTASIS) IMPLANT
DRSG OPSITE POSTOP 4X10 (GAUZE/BANDAGES/DRESSINGS) ×3 IMPLANT
DRSG SURGICEL FIBRILLAR 1X2 (HEMOSTASIS) ×3
ELECT CAUTERY BLADE 6.4 (BLADE) ×3 IMPLANT
ELECT REM PT RETURN 9FT ADLT (ELECTROSURGICAL) ×3
ELECTRODE REM PT RTRN 9FT ADLT (ELECTROSURGICAL) ×1 IMPLANT
GLOVE SURG UNDER POLY LF SZ6.5 (GLOVE) ×6 IMPLANT
GOWN STRL REUS W/ TWL LRG LVL3 (GOWN DISPOSABLE) ×1 IMPLANT
GOWN STRL REUS W/ TWL XL LVL3 (GOWN DISPOSABLE) ×2 IMPLANT
GOWN STRL REUS W/TWL LRG LVL3 (GOWN DISPOSABLE) ×3
GOWN STRL REUS W/TWL XL LVL3 (GOWN DISPOSABLE) ×6
MANIFOLD NEPTUNE II (INSTRUMENTS) ×3 IMPLANT
MAT PREVALON FULL STRYKER (MISCELLANEOUS) ×3 IMPLANT
NS IRRIG 1000ML POUR BTL (IV SOLUTION) ×3 IMPLANT
PACK C SECTION AR (MISCELLANEOUS) ×3 IMPLANT
PAD OB MATERNITY 4.3X12.25 (PERSONAL CARE ITEMS) ×3 IMPLANT
PAD PREP 24X41 OB/GYN DISP (PERSONAL CARE ITEMS) ×3 IMPLANT
PENCIL SMOKE EVACUATOR (MISCELLANEOUS) ×3 IMPLANT
RETRACTOR TRAXI PANNICULUS (MISCELLANEOUS) IMPLANT
SCRUB CHG 4% DYNA-HEX 4OZ (MISCELLANEOUS) ×1 IMPLANT
SOL PREP POV-IOD 4OZ 10% (MISCELLANEOUS) ×2 IMPLANT
SUT MNCRL AB 4-0 PS2 18 (SUTURE) ×3 IMPLANT
SUT PDS AB 1 TP1 96 (SUTURE) ×2 IMPLANT
SUT PLAIN 3-0 (SUTURE) ×3 IMPLANT
SUT VIC AB 0 CT1 36 (SUTURE) ×9 IMPLANT
SUT VIC AB 2-0 CT1 36 (SUTURE) ×3 IMPLANT
SYR 30ML LL (SYRINGE) ×6 IMPLANT
TRAXI PANNICULUS RETRACTOR (MISCELLANEOUS) ×2

## 2021-03-27 NOTE — Anesthesia Preprocedure Evaluation (Signed)
Anesthesia Evaluation  Patient identified by MRN, date of birth, ID band Patient awake    Reviewed: Allergy & Precautions, H&P , NPO status , Patient's Chart, lab work & pertinent test results, reviewed documented beta blocker date and time   Airway Mallampati: II  TM Distance: >3 FB Neck ROM: full    Dental no notable dental hx. (+) Teeth Intact   Pulmonary asthma , Current Smoker and Patient abstained from smoking.,    Pulmonary exam normal breath sounds clear to auscultation       Cardiovascular Exercise Tolerance: Good Normal cardiovascular exam+ Valvular Problems/Murmurs  Rhythm:regular Rate:Normal     Neuro/Psych  Headaches, Seizures -,  PSYCHIATRIC DISORDERS Anxiety Depression Bipolar Disorder    GI/Hepatic Neg liver ROS, GERD  Medicated,  Endo/Other  diabetesMorbid obesity  Renal/GU negative Renal ROS  negative genitourinary   Musculoskeletal   Abdominal   Peds  Hematology  (+) Blood dyscrasia, anemia ,   Anesthesia Other Findings Past Medical History: No date: ADHD No date: Anemia     Comment:  BLOOD TRANSFUSION AFTER C-SECTION ON 06-2016-2 UNITS No date: Anxiety No date: Asthma     Comment:  WELL CONTROLLED No date: Bipolar 1 disorder, depressed (HCC) No date: COVID-19 No date: Depression No date: Diabetes mellitus without complication (HCC) No date: Gallstones No date: GERD (gastroesophageal reflux disease) No date: Heart murmur 06/2016: History of blood transfusion     Comment:  RECEIVED 2 UNITS OF BLOOD AFTER C SECTION No date: Migraines No date: Ovarian cyst No date: Seizures (HCC)     Comment:  LAST SEIZURE 2016 No date: Tubal pregnancy Past Surgical History: 06/22/2016: CESAREAN SECTION; N/A     Comment:  Procedure: CESAREAN SECTION;  Surgeon: Vena Austria,              MD;  Location: [facility];  Service: Obstetrics;                Laterality: N/A; 10/12/2017: CESAREAN SECTION; N/A      Comment:  Procedure: CESAREAN SECTION;  Surgeon: Vena Austria, MD;  Location: [facility];  Service: Obstetrics;                Laterality: N/A; 08/13/2016: CHOLECYSTECTOMY; N/A     Comment:  Procedure: LAPAROSCOPIC CHOLECYSTECTOMY;  Surgeon: Leafy Ro, MD;  Location: [facility];  Service: General;                Laterality: N/A; No date: DILATION AND CURETTAGE OF UTERUS No date: ovarian cyst removed 2013: SALPINGECTOMY; Right     Comment:  ectopic pregnancy. PER PATIENT, STILL HAS BOTH TUBES No date: TONSILLECTOMY No date: TONSILLECTOMY AND ADENOIDECTOMY   Reproductive/Obstetrics                             Anesthesia Physical Anesthesia Plan  ASA: 3  Anesthesia Plan: Spinal   Post-op Pain Management:    Induction:   PONV Risk Score and Plan: 3  Airway Management Planned:   Additional Equipment:   Intra-op Plan:   Post-operative Plan:   Informed Consent: I have reviewed the patients History and Physical, chart, labs and discussed the procedure including the risks, benefits and alternatives for the proposed anesthesia with the patient or authorized representative who has indicated his/her understanding  and acceptance.     Dental Advisory Given  Plan Discussed with: CRNA  Anesthesia Plan Comments:         Anesthesia Quick Evaluation

## 2021-03-27 NOTE — Interval H&P Note (Signed)
History and Physical Interval Note:  03/27/2021 8:36 AM  Faith [name]  has presented today for surgery, with the diagnosis of History of prior cesarean diabetes complicating pregnancy.  The various methods of treatment have been discussed with the patient and family. After consideration of risks, benefits and other options for treatment, the patient has consented to  Procedure(s): CESAREAN SECTION (N/A) as a surgical intervention.  The patient's history has been reviewed, patient examined, no change in status, stable for surgery.  I have reviewed the patient's chart and labs.  Questions were answered to the patient's satisfaction.     Camp Gopal R Hasel Janish

## 2021-03-27 NOTE — Discharge Instructions (Addendum)
Discharge Instructions:   Follow-up Appointment: 1 week, please call the office to schedule  If there are any new medications, they have been ordered and will be available for pickup at the listed pharmacy on your way home from the hospital.   Call the office if you have any of the following: headache, visual changes, fever >101.0 F, chills, shortness of breath, breast concerns, excessive vaginal bleeding, incision drainage or problems, leg pain or redness, depression or any other concerns. If you have vaginal discharge with an odor, let your doctor know.   It is normal to bleed for up to 6 weeks. You should not soak through more than 1 pad in 1 hour. If you have a blood clot larger than your fist with continued bleeding, call your doctor.   After a c-section, you should expect a small amount of blood or clear fluid coming from the incision and abdominal cramping/soreness. Inspect your incision site daily. Stand in front of a mirror to look for any redness, incision opening, or discolored/odorness drainage. Take a shower daily and continue good hygiene. Use own towel and washcloth (do not share). Make sure your sheets on your bed are clean. No pets sleeping around your incision site. Dressing will be removed at your postpartum visit. If the dressing does become wet or soiled underneath, it is okay to remove it before your visit.     Activity: Do not lift > 15 lbs for 6 weeks (do not lift anything heavier than your baby). No intercourse, tampons, swimming pools, hot tubs, baths (only showers) for 6 weeks.  No driving for 1-2 weeks. Do not drive while taking narcotic or opioid pain medication.  Continue taking your prenatal vitamin, especially if breastfeeding. Increase calories and fluids (water) while breastfeeding.   Your milk will come in, in the next couple of days (right now it is colostrum). You may have a slight fever when your milk comes in, but it should go away on its own.  If it does  not, and rises above 101 F please call the doctor. You will also feel achy and your breasts will be firm. They will also start to leak. If you are breastfeeding, continue as you have been and you can pump/express milk for comfort.   If you have too much milk, your breasts can become engorged, which could lead to mastitis. This is an infection of the milk ducts. It can be very painful and you will need to notify your doctor to obtain a prescription for antibiotics. You can also treat it with a shower or hot/cold compress.   For concerns about your baby, please call your pediatrician.  For breastfeeding concerns, the lactation consultant can be reached at [phone].   Postpartum blues (feelings of happy one minute and sad another minute) are normal for the first few weeks but if it gets worse let your doctor know.   Congratulations! We enjoyed caring for you and your new bundle of joy!   

## 2021-03-27 NOTE — Op Note (Signed)
Cesarean Section Procedure Note 03/27/21  Pre-operative Diagnosis:  1. History of prior cesarean section  2. Gestational diabetes, poor glucose control  3. [redacted] week gestation Post-operative Diagnosis: same, delivered. Procedure: Repeat Low Transverse Cesarean Section Surgeon: Adelene Idler MD  Assistant(s): Vena Austria MD - No other skilled surgical assistant available. Anesthesia: Spinal Estimated Blood Loss: 460 cc Complications: None; patient tolerated the procedure well.  Disposition: PACU - hemodynamically stable. Condition: stable   Findings: A female infant in the cephalic presentation. Amniotic fluid - clear Birth weight: 8 lbs 10oz Apgars of 8 and 9.  Intact placenta with a three-vessel cord. Grossly normal uterus, tubes and ovaries bilaterally.  No intraabdominal adhesions were noted.   Procedure Details    The patient was taken to operating room, identified as the correct patient and the procedure verified as C-Section Delivery. A time out was held and the above information confirmed. After induction of anesthesia, the patient was draped and prepped in the usual sterile manner. A Pfannenstiel incision was made and carried down through the subcutaneous tissue to the fascia. Fascial incision was made and extended transversely with the Mayo scissors. The fascia was separated from the underlying rectus tissue superiorly and inferiorly. The peritoneum was identified and entered bluntly. Peritoneal incision was extended longitudinally. A low transverse hysterotomy was made. The fetus was delivered atraumatically. The umbilical cord was clamped x2 and cut and the infant was handed to the awaiting pediatricians. The placenta was removed intact and appeared normal with a 3-vessel cord. The uterus was exteriorized and cleared of all clot and debris. The hysterotomy was closed with running sutures of 0 Vicryl suture.  A second imbricating layer was placed with the same suture.  Excellent hemostasis was observed.  The uterus was returned to the abdomen. The pelvis was inspected and again, excellent hemostasis was noted. The peritoneum was closed with a running stitch of 2-0 Vicryl. The On Q Pain pump system was then placed.  Trocars were placed through the abdominal wall into the subfascial space and these were used to thread the silver soaker cathaters into place.The rectus muscles were inspected and were hemostatic. The rectus fascia was then reapproximated with running suture of PDS, with careful placement not to incorporate the cathaters. Subcutaneous tissues are then irrigated with saline and hemostasis assured with the bovie. The subcutaneous fat was approximated with 3-0 plain and a running stitch.  The skin was closed with 4-0 monocryl suture in a subcuticular fashion followed by skin adhesive. The cathaters are flushed each with 5 mL of Bupivicaine and stabilized into place with dressing. Instrument, sponge, and needle counts were correct prior to the abdominal closure and at the conclusion of the case.  The patient tolerated the procedure well and was transferred to the recovery room in stable condition.   Dr. Bonney Aid assisted with this case. This was a high level case requiring a Financial controller. No other assistant was readily available. He assisted with retraction of tissue and application of fundal pressure during the case.   Natale Milch MD [facility], [facility] 03/27/21 10:42 AM

## 2021-03-27 NOTE — Anesthesia Procedure Notes (Signed)
Spinal  Patient location during procedure: OR Start time: 03/27/2021 8:45 AM End time: 03/27/2021 8:53 AM Reason for block: surgical anesthesia Staffing Performed: resident/CRNA  Anesthesiologist: Yevette Edwards, MD Resident/CRNA: Omer Jack, CRNA Preanesthetic Checklist Completed: patient identified, IV checked, site marked, risks and benefits discussed, surgical consent, monitors and equipment checked, pre-op evaluation and timeout performed Spinal Block Patient position: sitting Prep: DuraPrep Patient monitoring: heart rate, cardiac monitor, continuous pulse ox and blood pressure Approach: midline Location: L4-5 Injection technique: single-shot Needle Needle type: Sprotte  Needle gauge: 24 G Needle length: 9 cm Assessment Sensory level: T4 Events: CSF return

## 2021-03-27 NOTE — Lactation Note (Signed)
This note was copied from a baby's chart. Lactation Consultation Note  Patient Name: Faith [name] RJJOA'C Date: 03/27/2021 Reason for consult: Initial assessment Age:30 hours Lactation to the room for initial visit. Mother is holding the baby and wanting assistance with feed. Encouraged feeding on demand and with cues. If baby is not cueing encouraged hand expression and skin to skin. Taught proper technique for hand expression. Drops were expressed into baby's mouth, he then latched and a rhythmic sucking pattern with lost of swallows. Baby was noted to have a click when feeding.  Encouraged 8 or more attempts in the first 24 hours.  Reviewed "Understanding Postpartum and Newborn Care" booklet at bedside. [facility] # left on board, encouraged to call for any assistance. Mother has no further questions at this time.   Maternal Data Has patient been taught Hand Expression?: Yes Does the patient have breastfeeding experience prior to this delivery?: Yes  Feeding Mother's Current Feeding Choice: Breast Milk  LATCH Score Latch: Repeated attempts needed to sustain latch, nipple held in mouth throughout feeding, stimulation needed to elicit sucking reflex.  Audible Swallowing: Spontaneous and intermittent  Type of Nipple: Everted at rest and after stimulation (short retracts)  Comfort (Breast/Nipple): Soft / non-tender  Hold (Positioning): Assistance needed to correctly position infant at breast and maintain latch.  LATCH Score: 8   Lactation Tools Discussed/Used Tools: Other (comment) (spoons left at bedside)  Interventions Interventions: Breast feeding basics reviewed;Skin to skin;Hand express;Breast compression;Adjust position;Support pillows;Position options;Education  Discharge    Consult Status Consult Status: Follow-up Date: 03/28/21 Follow-up type: In-patient    [name] D [name] 03/27/2021, 5:30 PM

## 2021-03-27 NOTE — Transfer of Care (Signed)
Immediate Anesthesia Transfer of Care Note  Patient: Faith [name]  Procedure(s) Performed: CESAREAN SECTION  Patient Location: PACU and Mother/Baby  Anesthesia Type:Spinal  Level of Consciousness: drowsy and patient cooperative  Airway & Oxygen Therapy: Patient Spontanous Breathing  Post-op Assessment: Report given to RN and Post -op Vital signs reviewed and stable  Post vital signs: Reviewed and stable  Last Vitals:  Vitals Value Taken Time  BP 107/61 03/27/21 1037  Temp    Pulse    Resp 18 03/27/21 1037  SpO2 98 % 03/27/21 1037    Last Pain:  Vitals:   03/27/21 0705  TempSrc:   PainSc: 0-No pain         Complications: No notable events documented.

## 2021-03-28 LAB — CBC
HCT: 29.7 % — ABNORMAL LOW (ref 36.0–46.0)
Hemoglobin: 9 g/dL — ABNORMAL LOW (ref 12.0–15.0)
MCH: 20.9 pg — ABNORMAL LOW (ref 26.0–34.0)
MCHC: 30.3 g/dL (ref 30.0–36.0)
MCV: 68.9 fL — ABNORMAL LOW (ref 80.0–100.0)
Platelets: 289 10*3/uL (ref 150–400)
RBC: 4.31 MIL/uL (ref 3.87–5.11)
RDW: 18.6 % — ABNORMAL HIGH (ref 11.5–15.5)
WBC: 12.9 10*3/uL — ABNORMAL HIGH (ref 4.0–10.5)
nRBC: 0 % (ref 0.0–0.2)

## 2021-03-28 LAB — HEMOGLOBIN A1C
Hgb A1c MFr Bld: 5.4 % (ref 4.8–5.6)
Mean Plasma Glucose: 108 mg/dL

## 2021-03-28 LAB — GLUCOSE, CAPILLARY
Glucose-Capillary: 106 mg/dL — ABNORMAL HIGH (ref 70–99)
Glucose-Capillary: 121 mg/dL — ABNORMAL HIGH (ref 70–99)
Glucose-Capillary: 102 mg/dL — ABNORMAL HIGH (ref 70–99)
Glucose-Capillary: 81 mg/dL (ref 70–99)
Glucose-Capillary: 95 mg/dL (ref 70–99)

## 2021-03-28 LAB — RPR: RPR Ser Ql: NONREACTIVE

## 2021-03-28 NOTE — Anesthesia Postprocedure Evaluation (Signed)
Anesthesia Post Note  Patient: Faith [name]  Procedure(s) Performed: CESAREAN SECTION  Patient location during evaluation: Mother Baby Anesthesia Type: Spinal Level of consciousness: oriented and awake and alert Pain management: pain level controlled Vital Signs Assessment: post-procedure vital signs reviewed and stable Respiratory status: spontaneous breathing and respiratory function stable Cardiovascular status: blood pressure returned to baseline and stable Postop Assessment: no headache, no backache, no apparent nausea or vomiting and able to ambulate Anesthetic complications: no   No notable events documented.   Last Vitals:  Vitals:   03/28/21 0500 03/28/21 0834  BP:  (!) 101/52  Pulse: 87 79  Resp:  18  Temp:  36.7 C  SpO2: 96% 96%    Last Pain:  Vitals:   03/28/21 0834  TempSrc: Oral  PainSc:                  Jules Schick

## 2021-03-28 NOTE — Progress Notes (Signed)
Obstetric Postpartum/PostOperative Daily Progress Note Subjective:  30 y.o. [id number] post-operative day # 1 status post repeat cesarean delivery.  She is ambulating, is tolerating po, is voiding spontaneously.  Her pain is well controlled on PO pain medications and On Q pump. Her lochia is less than menses. She reports baby is latching well and she is waiting for [facility] for additional assistance.   Medications SCHEDULED MEDICATIONS   ferrous sulfate  325 mg Oral BID WC   ibuprofen  600 mg Oral QID   insulin aspart  0-15 Units Subcutaneous TID WC   prenatal multivitamin  1 tablet Oral Q1200   senna-docusate  2 tablet Oral Q24H   simethicone  80 mg Oral TID PC    MEDICATION INFUSIONS   bupivacaine 0.25 % ON-Q pump DUAL CATH 400 mL     lactated ringers      PRN MEDICATIONS  bisacodyl, coconut oil, witch hazel-glycerin **AND** dibucaine, diphenhydrAMINE, menthol-cetylpyridinium, oxyCODONE-acetaminophen, simethicone, sodium phosphate, zolpidem    Objective:   Vitals:   03/28/21 0300 03/28/21 0400 03/28/21 0500 03/28/21 0834  BP:    (!) 101/52  Pulse: 67 67 87 79  Resp:    18  Temp:    98 F (36.7 C)  TempSrc:    Oral  SpO2: 96% 96% 96% 96%  Weight:      Height:        Current Vital Signs 24h Vital Sign Ranges  T 98 F (36.7 C) Temp  Avg: 98.3 F (36.8 C)  Min: 97.7 F (36.5 C)  Max: 98.6 F (37 C)  BP (!) 101/52  BP  Min: 77/46  Max: 107/46  HR 79 Pulse  Avg: 70  Min: 60  Max: 87  RR 18 Resp  Avg: 18.1  Min: 13  Max: 25  SaO2 96 % Room Air SpO2  Avg: 96.6 %  Min: 92 %  Max: 98 %       24 Hour I/O Current Shift I/O  Time Ins Outs 06/23 0701 - 06/24 0700 In: 500 [I.V.:400] Out: 2580 [Urine:2050] No intake/output data recorded.   General: NAD Pulmonary: no increased work of breathing Abdomen: non-distended, non-tender, fundus firm at level of umbilicus Inc: Clean/dry/intact, On Q intact Extremities: no edema, no erythema, no tenderness  Labs:  Recent Labs  Lab  03/26/21 0832 03/27/21 0724 03/28/21 0342  WBC 15.1* 15.4* 12.9*  HGB 9.2* 9.9* 9.0*  HCT 30.9* 32.3* 29.7*  PLT 333 349 289     Assessment:   30 y.o. [id number] postoperative day # 1 status post repeat cesarean section, lactating  Plan:  1) Acute blood loss anemia - hemodynamically stable and asymptomatic - po ferrous sulfate  2) O POS / Rubella 1.76 (12/17 1028)/ Varicella Immune  3) TDAP status: last administered 2018  4) breast /Contraception =  uncertain  5) Disposition: continue current care   Tresea Mall, CNM 03/28/2021 9:45 AM

## 2021-03-28 NOTE — Lactation Note (Signed)
This note was copied from a baby's chart. Lactation Consultation Note  Patient Name: Faith [name] KAJGO'T Date: 03/28/2021 Reason for consult: Follow-up assessment;Difficult latch Age:30 hours  Maternal Data Has patient been taught Hand Expression?: Yes  Feeding Mother's Current Feeding Choice: Breast Milk and Formula Nipple Type: Slow - flow Mom had bottlefed baby 10 cc formula before requesting lactation help, numerous attempts made in footballhold and cradle on both breasts but unable to obtain latch, baby very fussy and not opening mouth, baby has been formula fed since last pm, noted possble tight frenulum, baby not extending tongue over gumline, mom states other children have no history of this LATCH Score Latch: Too sleepy or reluctant, no latch achieved, no sucking elicited.                  Lactation Tools Discussed/Used  LC name and no written on white board, mom encouraged to call again for lactation assist if she wants to try breastfeeding again.   Interventions Interventions: Breast feeding basics reviewed;Assisted with latch;Hand express;Adjust position;Support pillows;Breast compression;Education  Discharge WIC Program: Yes  Consult Status Consult Status: PRN Date: 03/28/21 Follow-up type: In-patient    Dyann Kief 03/28/2021, 4:56 PM

## 2021-03-29 LAB — GLUCOSE, CAPILLARY: Glucose-Capillary: 88 mg/dL (ref 70–99)

## 2021-03-29 MED ORDER — OXYCODONE HCL 5 MG PO TABS: 5.0000 mg | ORAL_TABLET | Freq: Four times a day (QID) | ORAL | 0 refills | Status: AC | PRN

## 2021-03-29 NOTE — Progress Notes (Signed)
Patient discharged home with family.  Discharge instructions, when to follow up, and prescriptions reviewed with patient.  Patient verbalized understanding. Patient will be escorted out by auxiliary.   

## 2021-03-29 NOTE — Discharge Summary (Signed)
OB Discharge Summary     Patient Name: Faith [name] DOB: 06-Jul-1991 MRN: [id number]  Date of admission: 03/27/2021 Delivering MD: Adrian Prows Date of Delivery: 03/27/2021 Date of discharge: 03/29/2021  Admitting diagnosis: History of cesarean section [Z98.891] Intrauterine pregnancy: [redacted]w[redacted]d    Secondary diagnosis: Type II Diabetes Mellitus     Discharge diagnosis: Term Pregnancy Delivered                                                                                                Post partum procedures: none  Augmentation: N/A  Complications: None  Hospital course:  Sceduled C/S   30y.o. yo [id number] admitted to the hospital 03/27/2021 for scheduled cesarean section with the following indication:Elective Repeat.Delivery details are as follows:  Membrane Rupture Time/Date: 9:15 AM ,03/27/2021   Delivery Method:C-Section, Low Transverse  Details of operation can be found in separate operative note.  Patient had an uncomplicated postpartum course.  She is ambulating, tolerating a regular diet, passing flatus, and urinating well. Patient is discharged home in stable condition on  03/29/21        Newborn Data: Birth date:03/27/2021  Birth time:9:15 AM  Gender:Female  Living status:Living  Apgars:8 ,9  Weight:3900 g     Physical exam  Vitals:   03/28/21 1545 03/28/21 1547 03/28/21 2255 03/29/21 0810  BP: (!) 107/59 102/63 134/63 116/65  Pulse: 67 67 81 87  Resp: _0 Temp: 97.9 F (36.6 C) 97.9 F (36.6 C) 98.7 F (37.1 C) 98.6 F (37 C)  TempSrc: Oral Oral Oral Oral  SpO2: 97% 96% 99% 95%  Weight:      Height:       General: alert, cooperative, and no distress Lochia: appropriate Uterine Fundus: firm Incision: Healing well with no significant drainage DVT Evaluation: No evidence of DVT seen on physical exam.  Labs: Lab Results  Component Value Date   WBC 12.9 (H) 03/28/2021   HGB 9.0 (L) 03/28/2021   HCT 29.7 (L) 03/28/2021   MCV 68.9  (L) 03/28/2021   PLT 289 03/28/2021    Discharge instruction: per After Visit Summary.  Medications:  Allergies as of 03/29/2021       Reactions   Ivp Dye [iodinated Diagnostic Agents] Anaphylaxis   Per patient, she does not have problems with betadine   Latex Rash   Metrizamide Anaphylaxis   Morphine Other (See Comments)   bradycradia   Mushroom Extract Complex Anaphylaxis   Hives then swelling of throat and can not breath   Sulfasalazine Hives   Tomato Anaphylaxis   Toradol [ketorolac Tromethamine] Anaphylaxis, Hives, Swelling   Aspirin Hives   Dilaudid [hydromorphone Hcl] Hives   Lamictal [lamotrigine] Hives   Sulfa Antibiotics Hives   Tramadol Swelling, Rash, Hives   Adhesive [tape] Itching   USE PAPER TAPE   Iohexol Rash   (contrast dye) causes red bumps        Medication List     STOP taking these medications    alprazolam 2 MG tablet Commonly known as: XANAX   calcium carbonate 500  MG chewable tablet Commonly known as: TUMS - dosed in mg elemental calcium   terconazole 0.4 % vaginal cream Commonly known as: Terazol 7       TAKE these medications    Accu-Chek Nano SmartView w/Device Kit 1 kit by Subdermal route as directed. Check blood sugars for fasting, and two hours after breakfast, lunch and dinner (4 checks daily) What changed: when to take this   Accu-Chek SmartView test strip Generic drug: glucose blood Use as instructed to check blood sugars What changed:  how much to take how to take this when to take this   Accu-Chek Softclix Lancets lancets 1 each by Other route 4 (four) times daily.   acetaminophen 500 MG tablet Commonly known as: TYLENOL Take 1,000 mg by mouth every 6 (six) hours as needed (for pain).   ferrous sulfate 325 (65 FE) MG tablet Take 1 tablet (325 mg total) by mouth daily. What changed: when to take this   oxyCODONE 5 MG immediate release tablet Commonly known as: Roxicodone Take 1 tablet (5 mg total) by mouth  every 6 (six) hours as needed for up to 5 days for severe pain.               Discharge Care Instructions  (From admission, onward)           Start     Ordered   03/29/21 0000  Discharge wound care:       Comments: Keep incision dry, clean.   03/29/21 1031            Diet: carb modified diet  Activity: Advance as tolerated. Pelvic rest for 6 weeks.   Outpatient follow up:  Follow-up Information     Schuman, Stefanie Libel, MD. Schedule an appointment as soon as possible for a visit in 1 week(s).   Specialty: Obstetrics and Gynecology Why: For incision check Contact information: [address] [place] [address] [phone]                   Postpartum contraception: Undecided Rhogam Given postpartum: NA Rubella vaccine given postpartum: immune Varicella vaccine given postpartum: immune TDaP given antepartum or postpartum: last administered 2018  Newborn Data: Live born female  Birth Weight: 8 lb 9.6 oz (3900 g) APGAR: 8, 9  Newborn Delivery   Birth date/time: 03/27/2021 09:15:00 Delivery type: C-Section, Low Transverse Trial of labor: No C-section categorization: Repeat       Baby Feeding:  breast and formula  Disposition:home with mother  SIGNED:  Rod Can, CNM 03/29/2021 10:48 AM

## 2021-04-01 ENCOUNTER — Ambulatory Visit (INDEPENDENT_AMBULATORY_CARE_PROVIDER_SITE_OTHER): Payer: Medicaid Other | Admitting: Obstetrics and Gynecology

## 2021-04-01 ENCOUNTER — Other Ambulatory Visit: Payer: Self-pay

## 2021-04-01 ENCOUNTER — Encounter: Payer: Self-pay | Admitting: Obstetrics and Gynecology

## 2021-04-01 VITALS — BP 124/74 | Ht 60.0 in | Wt 211.0 lb

## 2021-04-01 DIAGNOSIS — K648 Other hemorrhoids: Secondary | ICD-10-CM

## 2021-04-01 DIAGNOSIS — L089 Local infection of the skin and subcutaneous tissue, unspecified: Secondary | ICD-10-CM

## 2021-04-01 MED ORDER — HYDROCORTISONE ACETATE 25 MG RE SUPP: 25.0000 mg | Freq: Two times a day (BID) | RECTAL | 1 refills | Status: DC

## 2021-04-01 MED ORDER — AMOXICILLIN-POT CLAVULANATE 875-125 MG PO TABS: 1.0000 | ORAL_TABLET | Freq: Two times a day (BID) | ORAL | 0 refills | Status: AC

## 2021-04-01 MED ORDER — DOCUSATE SODIUM 100 MG PO CAPS: 100.0000 mg | ORAL_CAPSULE | Freq: Two times a day (BID) | ORAL | 3 refills | Status: DC | PRN
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
## 2021-04-01 NOTE — Patient Instructions (Signed)
Wound Infection A wound infection happens when tiny organisms (microorganisms) start to grow in a wound. A wound infection is most often caused by bacteria. Infection can cause the wound to break open or worsen. Wound infection needs treatment. If a wound infection is left untreated, complications can occur. Untreated wound infections may lead to an infection in the bloodstream (septicemia) or a bone infection (osteomyelitis). What are the causes? This condition is most often caused by bacteria growing in a wound. Othermicroorganisms, like yeast and fungi, can also cause wound infections. What increases the risk? The following factors may make you more likely to develop this condition: Having a weak body defense system (immune system). Having diabetes. Taking steroid medicines for a long time (chronic use). Smoking. Being an older person. Being overweight. Taking chemotherapy medicines. What are the signs or symptoms? Symptoms of this condition include: Having more redness, swelling, or pain at the wound site. Having more blood or fluid at the wound site. A bad smell coming from a wound or bandage (dressing). Having a fever. Feeling tired or fatigued. Having warmth at or around the wound. Having pus at the wound site. How is this diagnosed? This condition is diagnosed with a medical history and physical exam. You mayalso have a wound culture or blood tests or both. How is this treated? This condition is usually treated with an antibiotic medicine. The infection should improve 24-48 hours after you start antibiotics. After 24-48 hours, redness around the wound should stop spreading, and the wound should be less painful. Follow these instructions at home: Medicines Take or apply over-the-counter and prescription medicines only as told by your health care provider. If you were prescribed an antibiotic medicine, take or apply it as told by your health care provider. Do not stop using the  antibiotic even if you start to feel better. Wound care  Clean the wound each day, or as told by your health care provider. Wash the wound with mild soap and water. Rinse the wound with water to remove all soap. Pat the wound dry with a clean towel. Do not rub it. Follow instructions from your health care provider about how to take care of your wound. Make sure you: Wash your hands with soap and water before and after you change your dressing. If soap and water are not available, use hand sanitizer. Change your dressing as told by your health care provider. Leave stitches (sutures), skin glue, or adhesive strips in place if your wound has been closed. These skin closures may need to stay in place for 2 weeks or longer. If adhesive strip edges start to loosen and curl up, you may trim the loose edges. Do not remove adhesive strips completely unless your health care provider tells you to do that. Some wounds are left open to heal on their own. Check your wound every day for signs of infection. Watch for: More redness, swelling, or pain. More fluid or blood. Warmth. Pus or a bad smell.  General instructions Keep the dressing dry until your health care provider says it can be removed. Do not take baths, swim, or use a hot tub until your health care provider approves. Ask your health care provider if you may take showers. You may only be allowed to take sponge baths. Raise (elevate) the injured area above the level of your heart while you are sitting or lying down. Do not scratch or pick at the wound. Keep all follow-up visits as told by your health care provider.   This is important. Contact a health care provider if: Your pain is not controlled with medicine. You have more redness, swelling, or pain around your wound. You have more fluid or blood coming from your wound. Your wound feels warm to the touch. You have pus coming from your wound. You continue to notice a bad smell coming from your  wound or your dressing. Your wound that was closed breaks open. Get help right away if: You have a red streak going away from your wound. You have a fever. Summary A wound infection happens when tiny organisms (microorganisms) start to grow in a wound. This condition is usually treated with an antibiotic medicine. Follow instructions from your health care provider about how to take care of your wound. Contact a health care provider if your wound infection does not begin to improve in 24-48 hours, or your symptoms worsen. Keep all follow-up visits as told by your health care provider. This is important. This information is not intended to replace advice given to you by your health care provider. Make sure you discuss any questions you have with your healthcare provider. Document Revised: 05/03/2018 Document Reviewed: 05/03/2018 Elsevier Patient Education  2022 Elsevier Inc.  

## 2021-04-02 ENCOUNTER — Other Ambulatory Visit: Payer: Medicaid Other

## 2021-04-09 ENCOUNTER — Ambulatory Visit (INDEPENDENT_AMBULATORY_CARE_PROVIDER_SITE_OTHER): Payer: Medicaid Other | Admitting: Obstetrics and Gynecology

## 2021-04-09 ENCOUNTER — Encounter: Payer: Self-pay | Admitting: Obstetrics and Gynecology

## 2021-04-09 ENCOUNTER — Other Ambulatory Visit: Payer: Self-pay

## 2021-04-09 DIAGNOSIS — L089 Local infection of the skin and subcutaneous tissue, unspecified: Secondary | ICD-10-CM

## 2021-04-09 MED ORDER — AMOXICILLIN-POT CLAVULANATE 875-125 MG PO TABS: 1.0000 | ORAL_TABLET | Freq: Two times a day (BID) | ORAL | 0 refills | Status: AC
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
## 2021-04-16 ENCOUNTER — Other Ambulatory Visit: Payer: Self-pay

## 2021-04-16 ENCOUNTER — Ambulatory Visit (INDEPENDENT_AMBULATORY_CARE_PROVIDER_SITE_OTHER): Payer: Medicaid Other | Admitting: Obstetrics and Gynecology

## 2021-04-16 ENCOUNTER — Encounter: Payer: Self-pay | Admitting: Obstetrics and Gynecology

## 2021-04-16 DIAGNOSIS — T7491XD Unspecified adult maltreatment, confirmed, subsequent encounter: Secondary | ICD-10-CM

## 2021-04-16 DIAGNOSIS — O99345 Other mental disorders complicating the puerperium: Secondary | ICD-10-CM

## 2021-04-16 DIAGNOSIS — F53 Postpartum depression: Secondary | ICD-10-CM
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
## 2021-04-30 ENCOUNTER — Other Ambulatory Visit: Payer: Self-pay

## 2021-04-30 ENCOUNTER — Ambulatory Visit (INDEPENDENT_AMBULATORY_CARE_PROVIDER_SITE_OTHER): Payer: Medicaid Other | Admitting: Obstetrics and Gynecology

## 2021-04-30 ENCOUNTER — Encounter: Payer: Self-pay | Admitting: Obstetrics and Gynecology

## 2021-04-30 VITALS — BP 116/72 | Ht 60.0 in | Wt 203.0 lb

## 2021-04-30 DIAGNOSIS — O99345 Other mental disorders complicating the puerperium: Secondary | ICD-10-CM

## 2021-04-30 DIAGNOSIS — F53 Postpartum depression: Secondary | ICD-10-CM

## 2021-04-30 DIAGNOSIS — T7491XD Unspecified adult maltreatment, confirmed, subsequent encounter: Secondary | ICD-10-CM

## 2021-04-30 DIAGNOSIS — B372 Candidiasis of skin and nail: Secondary | ICD-10-CM

## 2021-04-30 MED ORDER — NYSTATIN 100000 UNIT/GM EX POWD: 1.0000 "application " | Freq: Three times a day (TID) | CUTANEOUS | 0 refills | Status: DC

## 2021-04-30 MED ORDER — FLUCONAZOLE 150 MG PO TABS: 150.0000 mg | ORAL_TABLET | ORAL | 0 refills | Status: AC
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
## 2021-05-29 ENCOUNTER — Other Ambulatory Visit: Payer: Self-pay

## 2021-05-29 ENCOUNTER — Ambulatory Visit (INDEPENDENT_AMBULATORY_CARE_PROVIDER_SITE_OTHER): Payer: Medicaid Other | Admitting: Obstetrics and Gynecology

## 2021-05-29 DIAGNOSIS — K649 Unspecified hemorrhoids: Secondary | ICD-10-CM

## 2021-05-29 MED ORDER — DOCUSATE SODIUM 100 MG PO CAPS: 100.0000 mg | ORAL_CAPSULE | Freq: Two times a day (BID) | ORAL | 3 refills | Status: DC | PRN

## 2021-05-29 NOTE — Progress Notes (Signed)
Patient ID: Faith [name], female   DOB: 12/01/1990, 30 y.o.   MRN: [id number]  Reason for Consult: Postpartum Care   Referred by [name], MD  Subjective:     HPI:  Faith [name] is a 30 y.o. female. She is following up today following her cesarean section.  She reports that she is working on moving.  She reports that there is a new case open with child services about her and her son.   Gynecological History  Patient's last menstrual period was 05/15/2021. M Past Medical History:  Diagnosis Date   ADHD    Anemia    BLOOD TRANSFUSION AFTER C-SECTION ON 06-2016-2 UNITS   Anxiety    Asthma    WELL CONTROLLED   Bipolar 1 disorder, depressed ([place])    COVID-19    Depression    Diabetes mellitus without complication (HCC)    Gallstones    GERD (gastroesophageal reflux disease)    Heart murmur    History of blood transfusion 06/2016   RECEIVED 2 UNITS OF BLOOD AFTER C SECTION   Migraines    Ovarian cyst    Seizures ([place])    LAST SEIZURE January 2022   Tubal pregnancy    Family History  Problem Relation Age of Onset   Heart disease Father    Huntington's disease Mother    Past Surgical History:  Procedure Laterality Date   CESAREAN SECTION N/A 06/22/2016   Procedure: CESAREAN SECTION;  Surgeon: Malachy Mood, MD;  Location: [facility];  Service: Obstetrics;  Laterality: N/A;   CESAREAN SECTION N/A 10/12/2017   Procedure: CESAREAN SECTION;  Surgeon: Malachy Mood, MD;  Location: [facility];  Service: Obstetrics;  Laterality: N/A;   CESAREAN SECTION N/A 03/27/2021   Procedure: CESAREAN SECTION;  Surgeon: Homero Fellers, MD;  Location: [facility];  Service: Obstetrics;  Laterality: N/A;   CHOLECYSTECTOMY N/A 08/13/2016   Procedure: LAPAROSCOPIC CHOLECYSTECTOMY;  Surgeon: Jules Husbands, MD;  Location: [facility];  Service: General;  Laterality: N/A;   DILATION AND CURETTAGE OF UTERUS     ovarian cyst removed     SALPINGECTOMY Right 2013   ectopic pregnancy. PER  PATIENT, STILL HAS BOTH TUBES   TONSILLECTOMY     TONSILLECTOMY AND ADENOIDECTOMY      Short Social History:  Social History   Tobacco Use   Smoking status: Every Day    Packs/day: 0.50    Years: 7.00    Pack years: 3.50    Types: Cigarettes   Smokeless tobacco: Never  Substance Use Topics   Alcohol use: Not Currently    Alcohol/week: 0.0 standard drinks    Allergies  Allergen Reactions   Ivp Dye [Iodinated Diagnostic Agents] Anaphylaxis    Per patient, she does not have problems with betadine   Latex Rash   Metrizamide Anaphylaxis   Morphine Other (See Comments)    bradycradia    Mushroom Extract Complex Anaphylaxis    Hives then swelling of throat and can not breath   Sulfasalazine Hives   Tomato Anaphylaxis   Toradol [Ketorolac Tromethamine] Anaphylaxis, Hives and Swelling   Aspirin Hives   Dilaudid [Hydromorphone Hcl] Hives   Lamictal [Lamotrigine] Hives   Sulfa Antibiotics Hives   Tramadol Swelling, Rash and Hives   Adhesive [Tape] Itching    USE PAPER TAPE   Iohexol Rash    (contrast dye) causes red bumps    Current Outpatient Medications  Medication Sig Dispense Refill   docusate sodium (  COLACE) 100 MG capsule Take 1 capsule (100 mg total) by mouth 2 (two) times daily as needed. 60 capsule 3   Accu-Chek Softclix Lancets lancets 1 each by Other route 4 (four) times daily. 100 each 12   acetaminophen (TYLENOL) 500 MG tablet Take 1,000 mg by mouth every 6 (six) hours as needed (for pain).     Blood Glucose Monitoring Suppl (ACCU-CHEK NANO SMARTVIEW) w/Device KIT 1 kit by Subdermal route as directed. Check blood sugars for fasting, and two hours after breakfast, lunch and dinner (4 checks daily) 1 kit 0   ferrous sulfate 325 (65 FE) MG tablet Take 1 tablet (325 mg total) by mouth daily. 30 tablet 0   glucose blood (ACCU-CHEK SMARTVIEW) test strip Use as instructed to check blood sugars 100 each 12   hydrocortisone (ANUSOL-HC) 25 MG suppository Place 1  suppository (25 mg total) rectally 2 (two) times daily. 12 suppository 1   nystatin (MYCOSTATIN/NYSTOP) powder Apply 1 application topically 3 (three) times daily. 15 g 0   No current facility-administered medications for this visit.    REVIEW OF SYSTEMS      Objective:  Objective   Vitals:   05/29/21 1554  BP: 116/70  Weight: 206 lb (93.4 kg)  Height: 5' (1.524 m)   Body mass index is 40.23 kg/m.  Physical Exam  Assessment/Plan:     30 yo following up for postpartum care Ongoing social issues and stressors.  Emotional support offered.  Follow up as needed.     Adrian Prows MD [facility], [facility] 05/29/2021 4:17 PM

## 2021-07-22 ENCOUNTER — Other Ambulatory Visit (HOSPITAL_COMMUNITY): Payer: Self-pay | Admitting: Neurology

## 2021-07-22 ENCOUNTER — Other Ambulatory Visit: Payer: Self-pay | Admitting: Neurology

## 2021-07-22 DIAGNOSIS — R569 Unspecified convulsions: Secondary | ICD-10-CM

## 2021-07-27 ENCOUNTER — Ambulatory Visit: Payer: Medicaid Other

## 2021-09-19 ENCOUNTER — Emergency Department
Admission: EM | Admit: 2021-09-19 | Discharge: 2021-09-19 | Disposition: A | Discharge status: Home / Self Care | Payer: Medicaid Other | Attending: Emergency Medicine | Admitting: Emergency Medicine

## 2021-09-19 ENCOUNTER — Encounter: Payer: Self-pay | Admitting: Emergency Medicine

## 2021-09-19 ENCOUNTER — Emergency Department: Payer: Medicaid Other

## 2021-09-19 ENCOUNTER — Other Ambulatory Visit: Payer: Self-pay

## 2021-09-19 DIAGNOSIS — J45909 Unspecified asthma, uncomplicated: Secondary | ICD-10-CM | POA: Diagnosis not present

## 2021-09-19 DIAGNOSIS — Z91040 Latex allergy status: Secondary | ICD-10-CM | POA: Insufficient documentation

## 2021-09-19 DIAGNOSIS — S3023XA Contusion of vagina and vulva, initial encounter: Secondary | ICD-10-CM | POA: Insufficient documentation

## 2021-09-19 DIAGNOSIS — N898 Other specified noninflammatory disorders of vagina: Secondary | ICD-10-CM

## 2021-09-19 DIAGNOSIS — S3994XA Unspecified injury of external genitals, initial encounter: Secondary | ICD-10-CM | POA: Diagnosis present

## 2021-09-19 DIAGNOSIS — Z8616 Personal history of COVID-19: Secondary | ICD-10-CM | POA: Diagnosis not present

## 2021-09-19 DIAGNOSIS — X58XXXA Exposure to other specified factors, initial encounter: Secondary | ICD-10-CM | POA: Insufficient documentation

## 2021-09-19 DIAGNOSIS — R102 Pelvic and perineal pain: Secondary | ICD-10-CM | POA: Insufficient documentation

## 2021-09-19 DIAGNOSIS — F17210 Nicotine dependence, cigarettes, uncomplicated: Secondary | ICD-10-CM | POA: Insufficient documentation

## 2021-09-19 DIAGNOSIS — D509 Iron deficiency anemia, unspecified: Secondary | ICD-10-CM | POA: Insufficient documentation

## 2021-09-19 DIAGNOSIS — E119 Type 2 diabetes mellitus without complications: Secondary | ICD-10-CM | POA: Insufficient documentation

## 2021-09-19 DIAGNOSIS — A5901 Trichomonal vulvovaginitis: Secondary | ICD-10-CM | POA: Insufficient documentation

## 2021-09-19 LAB — BASIC METABOLIC PANEL
Anion gap: 7 (ref 5–15)
BUN: 13 mg/dL (ref 6–20)
CO2: 23 mmol/L (ref 22–32)
Calcium: 8.8 mg/dL — ABNORMAL LOW (ref 8.9–10.3)
Chloride: 105 mmol/L (ref 98–111)
Creatinine, Ser: 0.55 mg/dL (ref 0.44–1.00)
GFR, Estimated: >60 mL/min (ref >60)
Glucose, Bld: 104 mg/dL — ABNORMAL HIGH (ref 70–99)
Potassium: 3.5 mmol/L (ref 3.5–5.1)
Sodium: 135 mmol/L (ref 135–145)

## 2021-09-19 LAB — CBC
HCT: 27.4 % — ABNORMAL LOW (ref 36.0–46.0)
Hemoglobin: 7.7 g/dL — ABNORMAL LOW (ref 12.0–15.0)
MCH: 18.3 pg — ABNORMAL LOW (ref 26.0–34.0)
MCHC: 28.1 g/dL — ABNORMAL LOW (ref 30.0–36.0)
MCV: 65.2 fL — ABNORMAL LOW (ref 80.0–100.0)
Platelets: 331 10*3/uL (ref 150–400)
RBC: 4.2 MIL/uL (ref 3.87–5.11)
RDW: 18 % — ABNORMAL HIGH (ref 11.5–15.5)
WBC: 17.8 10*3/uL — ABNORMAL HIGH (ref 4.0–10.5)
nRBC: 0 % (ref 0.0–0.2)

## 2021-09-19 LAB — CHLAMYDIA/NGC RT PCR (ARMC ONLY)
Chlamydia Tr: NOT DETECTED
N gonorrhoeae: NOT DETECTED

## 2021-09-19 LAB — POC URINE PREG, ED: Preg Test, Ur: NEGATIVE

## 2021-09-19 LAB — WET PREP, GENITAL
Clue Cells Wet Prep HPF POC: NONE SEEN
Sperm: NONE SEEN
WBC, Wet Prep HPF POC: <10 (ref <10)
Yeast Wet Prep HPF POC: NONE SEEN

## 2021-09-19 LAB — HCG, QUANTITATIVE, PREGNANCY: hCG, Beta Chain, Quant, S: 1 m[IU]/mL (ref <5)

## 2021-09-19 MED ORDER — METRONIDAZOLE 500 MG PO TABS
500.0000 mg | ORAL_TABLET | Freq: Once | ORAL | Status: AC
  Administered 2021-09-19: 500 mg via ORAL
  Filled 2021-09-19: qty 1

## 2021-09-19 MED ORDER — SODIUM CHLORIDE 0.9 % IV SOLN
1000.0000 mg | Freq: Once | INTRAVENOUS | Status: AC
  Administered 2021-09-19: 1000 mg via INTRAVENOUS
  Filled 2021-09-19: qty 20

## 2021-09-19 MED ORDER — IRON 325 (65 FE) MG PO TABS: 1.0000 | ORAL_TABLET | Freq: Every day | ORAL | 2 refills | Status: DC

## 2021-09-19 MED ORDER — METRONIDAZOLE 500 MG PO TABS: 500.0000 mg | ORAL_TABLET | Freq: Two times a day (BID) | ORAL | 0 refills | Status: AC

## 2021-09-19 NOTE — Consult Note (Incomplete)
Reason for Consult:*** Referring Physician: Dr. Don Perking and Dr. Rolanda Jay Faith [name] is an 30 y.o. female.  HPI: ***  Past Medical History:  Diagnosis Date   ADHD    Anemia    BLOOD TRANSFUSION AFTER C-SECTION ON 06-2016-2 UNITS   Anxiety    Asthma    WELL CONTROLLED   Bipolar 1 disorder, depressed (HCC)    COVID-19    Depression    Diabetes mellitus without complication (HCC)    Gallstones    GERD (gastroesophageal reflux disease)    Heart murmur    History of blood transfusion 06/2016   RECEIVED 2 UNITS OF BLOOD AFTER C SECTION   Migraines    Ovarian cyst    Seizures (HCC)    LAST SEIZURE January 2022   Tubal pregnancy     Past Surgical History:  Procedure Laterality Date   CESAREAN SECTION N/A 06/22/2016   Procedure: CESAREAN SECTION;  Surgeon: Vena Austria, MD;  Location: [facility] ORS;  Service: Obstetrics;  Laterality: N/A;   CESAREAN SECTION N/A 10/12/2017   Procedure: CESAREAN SECTION;  Surgeon: Vena Austria, MD;  Location: [facility] OR[facility];  Service: Obstetrics;  Laterality: N/A;   CESAREAN SECTION N/A 03/27/2021   Procedure: CESAREAN SECTION;  Surgeon: Natale Milch, MD;  Location: [facility] ORS;  Service: Obstetrics;  Laterality: N/A;   CHOLECYSTECTOMY N/A 08/13/2016   Procedure: LAPAROSCOPIC CHOLECYSTECTOMY;  Surgeon: Leafy Ro, MD;  Location: [facility] ORS;  Service: General;  Laterality: N/A;   DILATION AND CURETTAGE OF UTERUS     ovarian cyst removed     SALPINGECTOMY Right 2013   ectopic pregnancy. PER PATIENT, STILL HAS BOTH TUBES   TONSILLECTOMY     TONSILLECTOMY AND ADENOIDECTOMY      Family History  Problem Relation Age of Onset   Heart disease Father    Huntington's disease Mother     Social History:  reports that she has been smoking cigarettes. She has a 3.50 pack-year smoking history. She has never used smokeless tobacco. She reports that she does not currently use alcohol. She reports that she does not use drugs.  Allergies:  Allergies   Allergen Reactions   Ivp Dye [Iodinated Diagnostic Agents] Anaphylaxis    Per patient, she does not have problems with betadine   Latex Rash   Metrizamide Anaphylaxis   Morphine Other (See Comments)    bradycradia    Mushroom Extract Complex Anaphylaxis    Hives then swelling of throat and can not breath   Sulfasalazine Hives   Tomato Anaphylaxis   Toradol [Ketorolac Tromethamine] Anaphylaxis, Hives and Swelling   Aspirin Hives   Dilaudid [Hydromorphone Hcl] Hives   Lamictal [Lamotrigine] Hives   Sulfa Antibiotics Hives   Tramadol Swelling, Rash and Hives   Adhesive [Tape] Itching    USE PAPER TAPE   Iohexol Rash    (contrast dye) causes red bumps    Medications: {medication reviewed/display:3041432}  Results for orders placed or performed during the hospital encounter of 09/19/21 (from the past 48 hour(s))  Wet prep, genital     Status: Abnormal   Collection Time: 09/19/21  4:45 AM  Result Value Ref Range   Yeast Wet Prep HPF POC NONE SEEN NONE SEEN   Trich, Wet Prep PRESENT (A) NONE SEEN   Clue Cells Wet Prep HPF POC NONE SEEN NONE SEEN   WBC, Wet Prep HPF POC <10 <10   Sperm NONE SEEN     Comment: Performed at [facility], [address] [place], [place] [address]  Chlamydia/NGC rt PCR [facility] only)     Status: None   Collection Time: 09/19/21  4:45 AM  Result Value Ref Range   Specimen source GC/Chlam ENDOCERVICAL    Chlamydia Tr NOT DETECTED NOT DETECTED   N gonorrhoeae NOT DETECTED NOT DETECTED    Comment: (NOTE) This CT/NG assay has not been evaluated in patients with a history of  hysterectomy. Performed at [facility], [address] [place], [place] [address]   POC urine preg, ED (not at [facility])     Status: None   Collection Time: 09/19/21  4:55 AM  Result Value Ref Range   Preg Test, Ur NEGATIVE NEGATIVE    Comment:        THE SENSITIVITY OF THIS METHODOLOGY IS >24 mIU/mL   hCG, quantitative, pregnancy     Status: None    Collection Time: 09/19/21  5:07 AM  Result Value Ref Range   hCG, Beta Chain, Quant, S 1 <5 mIU/mL    Comment:          GEST. AGE      CONC.  (mIU/mL)   <=1 WEEK        5 - 50     2 WEEKS       50 - 500     3 WEEKS       100 - 10,000     4 WEEKS     1,000 - 30,000     5 WEEKS     3,500 - 115,000   6-8 WEEKS     12,000 - 270,000    12 WEEKS     15,000 - 220,000        FEMALE AND NON-PREGNANT FEMALE:     LESS THAN 5 mIU/mL Performed at [facility], [address] [place], [place] [address]   CBC     Status: Abnormal   Collection Time: 09/19/21  5:07 AM  Result Value Ref Range   WBC 17.8 (H) 4.0 - 10.5 K/uL   RBC 4.20 3.87 - 5.11 MIL/uL   Hemoglobin 7.7 (L) 12.0 - 15.0 g/dL    Comment: Reticulocyte Hemoglobin testing may be clinically indicated, consider ordering this additional test [id number]    HCT 27.4 (L) 36.0 - 46.0 %   MCV 65.2 (L) 80.0 - 100.0 fL   MCH 18.3 (L) 26.0 - 34.0 pg   MCHC 28.1 (L) 30.0 - 36.0 g/dL   RDW 82.5 (H) 00.3 - 70.4 %   Platelets 331 150 - 400 K/uL   nRBC 0.0 0.0 - 0.2 %    Comment: Performed at [facility], [address], [place], [place] [address]  Basic metabolic panel     Status: Abnormal   Collection Time: 09/19/21  5:07 AM  Result Value Ref Range   Sodium 135 135 - 145 mmol/L   Potassium 3.5 3.5 - 5.1 mmol/L   Chloride 105 98 - 111 mmol/L   CO2 23 22 - 32 mmol/L   Glucose, Bld 104 (H) 70 - 99 mg/dL    Comment: Glucose reference range applies only to samples taken after fasting for at least 8 hours.   BUN 13 6 - 20 mg/dL   Creatinine, Ser 6.94 0.44 - 1.00 mg/dL   Calcium 8.8 (L) 8.9 - 10.3 mg/dL   GFR, Estimated >50 >38 mL/min    Comment: (NOTE) Calculated using the CKD-EPI Creatinine Equation (2021)    Anion gap 7  5 - 15    Comment: Performed at [facility], [address] [place], [place] [address]    US PELVIS LIMITED (TRANSABDOMINAL ONLY)  Result Date: 09/19/2021 CLINICAL DATA:  30 year old  female with history of swelling and pain of the left labia majora. EXAM: LIMITED ULTRASOUND OF PELVIS TECHNIQUE: Limited transabdominal ultrasound examination of the pelvis was performed. COMPARISON:  None. FINDINGS: Focused ultrasound imaging was performed in the area of pain and swelling, which demonstrated a complex lesion of heterogeneous internal echotexture which measures approximately 3.0 x 2.6 x 2.2 cm. No significant internal blood flow associated with this lesion. IMPRESSION: 1. 3.0 x 2.6 x 2.2 cm lesion in the left labia majora with complex imaging characteristics, but no internal blood flow. This is favored to represent a small hematoma. Electronically Signed   By: Trudie Reed M.D.   On: 09/19/2021 06:47    Review of Systems Blood pressure (!) 98/52, pulse 96, temperature 98.9 F (37.2 C), temperature source Oral, resp. rate 18, height 5' (1.524 m), weight 90.3 kg, last menstrual period 08/14/2021, SpO2 95 %, currently breastfeeding. Physical Exam  Assessment/Plan: ***  Faith [name] R Brynlei Klausner 09/19/2021, 9:05 AM
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
## 2021-09-19 NOTE — ED Notes (Signed)
Patient transported to Ultrasound 
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
## 2021-09-19 NOTE — ED Notes (Signed)
Sane  Nurse  called per  DR.Roxan Hockey MD

## 2021-09-19 NOTE — ED Triage Notes (Signed)
Patient ambulatory to triage with steady gait, without difficulty or distress noted; pt reports vaginal "swelling" and bleeding since yesterday

## 2021-09-19 NOTE — ED Notes (Signed)
Pt insisted on stopping iron infusion and she wants to go now.

## 2021-11-03 ENCOUNTER — Other Ambulatory Visit: Payer: Self-pay

## 2021-11-03 ENCOUNTER — Encounter: Payer: Self-pay | Admitting: Obstetrics and Gynecology

## 2021-11-03 ENCOUNTER — Ambulatory Visit (INDEPENDENT_AMBULATORY_CARE_PROVIDER_SITE_OTHER): Payer: Medicaid Other | Admitting: Obstetrics and Gynecology

## 2021-11-03 VITALS — BP 122/70 | Ht 60.0 in | Wt 210.6 lb

## 2021-11-03 DIAGNOSIS — N926 Irregular menstruation, unspecified: Secondary | ICD-10-CM | POA: Diagnosis not present

## 2021-11-03 NOTE — Progress Notes (Signed)
Patient ID: Faith [name], female   DOB: 06-29-1991, 31 y.o.   MRN: [id number]  Reason for Consult: Gynecologic Exam   Referred by [name], MD  Subjective:     HPI:  Faith [name] is a 31 y.o. female she is here today to follow-up regarding a possible miscarriage.  She reports that she was told that she had had a miscarriage.  She first found out she was pregnant when she had a positive pregnancy test on 09/19/2021 through [employer] clinic.  She reports that she has not had an ultrasound this pregnancy.  She has not taken a pregnancy test since December.  She reports that on 10/14/2021 she had heavy bleeding.  This bleeding was heavy enough to necessitate an iron transfusion.  The bleeding stopped but recently she has had 5 days of bleeding.  This bleeding seems heavier than a normal period.  Gynecological History  No LMP recorded.  Past Medical History:  Diagnosis Date   ADHD    Anemia    BLOOD TRANSFUSION AFTER C-SECTION ON 06-2016-2 UNITS   Anxiety    Asthma    WELL CONTROLLED   Bipolar 1 disorder, depressed (HCC)    COVID-19    Depression    Diabetes mellitus without complication (HCC)    Gallstones    GERD (gastroesophageal reflux disease)    Heart murmur    History of blood transfusion 06/2016   RECEIVED 2 UNITS OF BLOOD AFTER C SECTION   Migraines    Ovarian cyst    Seizures ([place])    LAST SEIZURE January 2022   Tubal pregnancy    Family History  Problem Relation Age of Onset   Heart disease Father    Huntington's disease Mother    Past Surgical History:  Procedure Laterality Date   CESAREAN SECTION N/A 06/22/2016   Procedure: CESAREAN SECTION;  Surgeon: Malachy Mood, MD;  Location: [facility];  Service: Obstetrics;  Laterality: N/A;   CESAREAN SECTION N/A 10/12/2017   Procedure: CESAREAN SECTION;  Surgeon: Malachy Mood, MD;  Location: [facility];  Service: Obstetrics;  Laterality: N/A;   CESAREAN SECTION N/A 03/27/2021   Procedure: CESAREAN  SECTION;  Surgeon: Homero Fellers, MD;  Location: [facility];  Service: Obstetrics;  Laterality: N/A;   CHOLECYSTECTOMY N/A 08/13/2016   Procedure: LAPAROSCOPIC CHOLECYSTECTOMY;  Surgeon: Jules Husbands, MD;  Location: [facility];  Service: General;  Laterality: N/A;   DILATION AND CURETTAGE OF UTERUS     ovarian cyst removed     SALPINGECTOMY Right 2013   ectopic pregnancy. PER PATIENT, STILL HAS BOTH TUBES   TONSILLECTOMY     TONSILLECTOMY AND ADENOIDECTOMY      Short Social History:  Social History   Tobacco Use   Smoking status: Every Day    Packs/day: 0.50    Years: 7.00    Pack years: 3.50    Types: Cigarettes   Smokeless tobacco: Never  Substance Use Topics   Alcohol use: Not Currently    Alcohol/week: 0.0 standard drinks    Allergies  Allergen Reactions   Ivp Dye [Iodinated Contrast Media] Anaphylaxis    Per patient, she does not have problems with betadine   Latex Rash   Metrizamide Anaphylaxis   Morphine Other (See Comments)    bradycradia    Mushroom Extract Complex Anaphylaxis    Hives then swelling of throat and can not breath   Sulfasalazine Hives   Tomato Anaphylaxis   Toradol [Ketorolac Tromethamine]  Anaphylaxis, Hives and Swelling   Aspirin Hives   Dilaudid [Hydromorphone Hcl] Hives   Lamictal [Lamotrigine] Hives   Sulfa Antibiotics Hives   Tramadol Swelling, Rash and Hives   Adhesive [Tape] Itching    USE PAPER TAPE   Iohexol Rash    (contrast dye) causes red bumps    Current Outpatient Medications  Medication Sig Dispense Refill   Accu-Chek Softclix Lancets lancets 1 each by Other route 4 (four) times daily. 100 each 12   acetaminophen (TYLENOL) 500 MG tablet Take 1,000 mg by mouth every 6 (six) hours as needed (for pain).     Blood Glucose Monitoring Suppl (ACCU-CHEK NANO SMARTVIEW) w/Device KIT 1 kit by Subdermal route as directed. Check blood sugars for fasting, and two hours after breakfast, lunch and dinner (4 checks daily) 1 kit 0    docusate sodium (COLACE) 100 MG capsule Take 1 capsule (100 mg total) by mouth 2 (two) times daily as needed. 60 capsule 3   Ferrous Sulfate (IRON) 325 (65 Fe) MG TABS Take 1 tablet (325 mg total) by mouth daily. 30 tablet 2   glucose blood (ACCU-CHEK SMARTVIEW) test strip Use as instructed to check blood sugars 100 each 12   hydrocortisone (ANUSOL-HC) 25 MG suppository Place 1 suppository (25 mg total) rectally 2 (two) times daily. 12 suppository 1   nystatin (MYCOSTATIN/NYSTOP) powder Apply 1 application topically 3 (three) times daily. 15 g 0   No current facility-administered medications for this visit.    Review of Systems  Constitutional: Negative for chills, fatigue, fever and unexpected weight change.  HENT: Negative for trouble swallowing.  Eyes: Negative for loss of vision.  Respiratory: Negative for cough, shortness of breath and wheezing.  Cardiovascular: Negative for chest pain, leg swelling, palpitations and syncope.  GI: Negative for abdominal pain, blood in stool, diarrhea, nausea and vomiting.  GU: Negative for difficulty urinating, dysuria, frequency and hematuria.  Musculoskeletal: Negative for back pain, leg pain and joint pain.  Skin: Negative for rash.  Neurological: Negative for dizziness, headaches, light-headedness, numbness and seizures.  Psychiatric: Negative for behavioral problem, confusion, depressed mood and sleep disturbance.       Objective:  Objective   Vitals:   11/03/21 1444  BP: 122/70  Weight: 210 lb 9.6 oz (95.5 kg)  Height: 5' (1.524 m)   Body mass index is 41.13 kg/m.  Physical Exam Vitals and nursing note reviewed. Exam conducted with a chaperone present.  Constitutional:      Appearance: Normal appearance. She is well-developed.  HENT:     Head: Normocephalic and atraumatic.  Eyes:     Extraocular Movements: Extraocular movements intact.     Pupils: Pupils are equal, round, and reactive to light.  Cardiovascular:     Rate and  Rhythm: Normal rate and regular rhythm.  Pulmonary:     Effort: Pulmonary effort is normal. No respiratory distress.     Breath sounds: Normal breath sounds.  Abdominal:     General: Abdomen is flat.     Palpations: Abdomen is soft.  Genitourinary:    Comments: External: Normal appearing vulva. No lesions noted.  Speculum examination: Normal appearing cervix. Scant pink blood in the vaginal vault. Bimanual examination: Uterus midline, non-tender, normal in size, shape and contour.  No CMT. No adnexal masses. No adnexal tenderness. Pelvis not fixed.  Breast exam: exam not performed Musculoskeletal:        General: No signs of injury.  Skin:    General: Skin is warm  and dry.  Neurological:     Mental Status: She is alert and oriented to person, place, and time.  Psychiatric:        Behavior: Behavior normal.        Thought Content: Thought content normal.        Judgment: Judgment normal.    Assessment/Plan:    31  yo with irregular bleeding, concerns for possible miscarriage- no evidence of pregnancy.  Urine pregnancy test is negative her today. ED beta HCG on 09/19/2021 was 1, which is negative Bedside pelvic US showed a normal uterus with a thin endometrium, no color flow.  Minimal bleeding on speculum exam today Patient declined contraception for pregnancy prevention. Reported at the end of the visit she is trying to conceive again with her partner. Encouraged pregnancy spacing of 12 to 18 months for her health.    More than 30 minutes were spent face to face with the patient in the room, reviewing the medical record, labs and images, and coordinating care for the patient. The plan of management was discussed in detail and counseling was provided.    Adrian Prows MD [facility] 11/03/2021 2:49 PM

## 2021-11-13 ENCOUNTER — Encounter: Payer: Medicaid Other | Admitting: Obstetrics and Gynecology

## 2021-11-17 ENCOUNTER — Other Ambulatory Visit: Payer: Self-pay

## 2021-11-17 DIAGNOSIS — J45909 Unspecified asthma, uncomplicated: Secondary | ICD-10-CM | POA: Insufficient documentation

## 2021-11-17 DIAGNOSIS — R0789 Other chest pain: Secondary | ICD-10-CM | POA: Diagnosis not present

## 2021-11-17 DIAGNOSIS — D649 Anemia, unspecified: Secondary | ICD-10-CM | POA: Insufficient documentation

## 2021-11-17 DIAGNOSIS — Y990 Civilian activity done for income or pay: Secondary | ICD-10-CM | POA: Diagnosis not present

## 2021-11-17 DIAGNOSIS — R42 Dizziness and giddiness: Secondary | ICD-10-CM | POA: Diagnosis not present

## 2021-11-17 DIAGNOSIS — Z8616 Personal history of COVID-19: Secondary | ICD-10-CM | POA: Insufficient documentation

## 2021-11-17 DIAGNOSIS — R569 Unspecified convulsions: Secondary | ICD-10-CM | POA: Insufficient documentation

## 2021-11-17 NOTE — ED Triage Notes (Signed)
Pt states that two days ago she was at work and started having chest pain and pain down her left arm. Yesterday at work the pt had a seizure. Today she states she needs her iron level checked. Reports a little chest pain at this time and is feeling dizzy.

## 2021-11-18 ENCOUNTER — Emergency Department
Admission: EM | Admit: 2021-11-18 | Discharge: 2021-11-18 | Disposition: A | Discharge status: Home / Self Care | Payer: Medicaid Other | Attending: Emergency Medicine | Admitting: Emergency Medicine

## 2021-11-18 DIAGNOSIS — R079 Chest pain, unspecified: Secondary | ICD-10-CM

## 2021-11-18 DIAGNOSIS — R569 Unspecified convulsions: Secondary | ICD-10-CM

## 2021-11-18 LAB — CBC
HCT: 35.4 % — ABNORMAL LOW (ref 36.0–46.0)
Hemoglobin: 10.1 g/dL — ABNORMAL LOW (ref 12.0–15.0)
MCH: 20.3 pg — ABNORMAL LOW (ref 26.0–34.0)
MCHC: 28.5 g/dL — ABNORMAL LOW (ref 30.0–36.0)
MCV: 71.1 fL — ABNORMAL LOW (ref 80.0–100.0)
Platelets: 383 10*3/uL (ref 150–400)
RBC: 4.98 MIL/uL (ref 3.87–5.11)
RDW: 21 % — ABNORMAL HIGH (ref 11.5–15.5)
WBC: 11.2 10*3/uL — ABNORMAL HIGH (ref 4.0–10.5)
nRBC: 0 % (ref 0.0–0.2)

## 2021-11-18 LAB — URINALYSIS, COMPLETE (UACMP) WITH MICROSCOPIC
Bilirubin Urine: NEGATIVE
Glucose, UA: NEGATIVE mg/dL
Hgb urine dipstick: NEGATIVE
Ketones, ur: 5 mg/dL — AB
Nitrite: NEGATIVE
Protein, ur: NEGATIVE mg/dL
Specific Gravity, Urine: 1.029 (ref 1.005–1.030)
pH: 7 (ref 5.0–8.0)

## 2021-11-18 LAB — TROPONIN I (HIGH SENSITIVITY): Troponin I (High Sensitivity): 4 ng/L (ref <18)

## 2021-11-18 LAB — BASIC METABOLIC PANEL
Anion gap: 6 (ref 5–15)
BUN: 15 mg/dL (ref 6–20)
CO2: 25 mmol/L (ref 22–32)
Calcium: 8.7 mg/dL — ABNORMAL LOW (ref 8.9–10.3)
Chloride: 106 mmol/L (ref 98–111)
Creatinine, Ser: 0.57 mg/dL (ref 0.44–1.00)
GFR, Estimated: >60 mL/min (ref >60)
Glucose, Bld: 106 mg/dL — ABNORMAL HIGH (ref 70–99)
Potassium: 3.8 mmol/L (ref 3.5–5.1)
Sodium: 137 mmol/L (ref 135–145)

## 2021-11-18 LAB — POC URINE PREG, ED: Preg Test, Ur: NEGATIVE

## 2021-11-18 NOTE — ED Notes (Signed)
Pt states she has hx of seizures and is taking Vimpat.

## 2021-11-18 NOTE — ED Provider Notes (Signed)
[facility] Provider Note    Event Date/Time   First MD Initiated Contact with Patient 11/18/21 [phone]     (approximate)   History   Seizures, Chest Pain, Anemia, Dizziness, and Shaking   HPI  Faith [name] is a 31 y.o. female with a history of seizure disorder, iron deficiency anemia, diabetes, bipolar disorder who presents to the emergency room for several medical complaints.  Patient reports that the main reason why she is here today is because she had a seizure at work.  Patient is unable to tell me how often she has seizures.  She is on Vimpat and describes compliance with it.  She also reports that 2 nights ago while she was at work she had some chest pressure that went down her left arm.  She has not had pain for over 2 days now.  She also had some lightheadedness associated with the chest pain.  She is concerned because in the past when she had chest pain and seizures, her iron was low therefore she came in requesting that her iron level be checked.  She denies any bleeding.     Past Medical History:  Diagnosis Date   ADHD    Anemia    BLOOD TRANSFUSION AFTER C-SECTION ON 06-2016-2 UNITS   Anxiety    Asthma    WELL CONTROLLED   Bipolar 1 disorder, depressed (HCC)    COVID-19    Depression    Diabetes mellitus without complication (HCC)    Gallstones    GERD (gastroesophageal reflux disease)    Heart murmur    History of blood transfusion 06/2016   RECEIVED 2 UNITS OF BLOOD AFTER C SECTION   Migraines    Ovarian cyst    Seizures (HCC)    LAST SEIZURE January 2022   Tubal pregnancy     Past Surgical History:  Procedure Laterality Date   CESAREAN SECTION N/A 06/22/2016   Procedure: CESAREAN SECTION;  Surgeon: Vena Austria, MD;  Location: [facility];  Service: Obstetrics;  Laterality: N/A;   CESAREAN SECTION N/A 10/12/2017   Procedure: CESAREAN SECTION;  Surgeon: Vena Austria, MD;  Location: [facility];  Service: Obstetrics;  Laterality:  N/A;   CESAREAN SECTION N/A 03/27/2021   Procedure: CESAREAN SECTION;  Surgeon: Natale Milch, MD;  Location: [facility];  Service: Obstetrics;  Laterality: N/A;   CHOLECYSTECTOMY N/A 08/13/2016   Procedure: LAPAROSCOPIC CHOLECYSTECTOMY;  Surgeon: Leafy Ro, MD;  Location: [facility];  Service: General;  Laterality: N/A;   DILATION AND CURETTAGE OF UTERUS     ovarian cyst removed     SALPINGECTOMY Right 2013   ectopic pregnancy. PER PATIENT, STILL HAS BOTH TUBES   TONSILLECTOMY     TONSILLECTOMY AND ADENOIDECTOMY       Physical Exam   Triage Vital Signs: ED Triage Vitals  Enc Vitals Group     BP 11/18/21 0000 126/78     Pulse Rate 11/17/21 2354 88     Resp 11/17/21 2354 18     Temp 11/17/21 2354 98.8 F (37.1 C)     Temp Source 11/17/21 2354 Oral     SpO2 11/17/21 2354 97 %     Weight 11/17/21 2355 210 lb 8.6 oz (95.5 kg)     Height --      Head Circumference --      Peak Flow --      Pain Score 11/17/21 2355 8     Pain Loc --  Pain Edu? --      Excl. in GC? --     Most recent vital signs: Vitals:   11/18/21 0000 11/18/21 0255  BP: 126/78 (!) 103/58  Pulse:  68  Resp:  17  Temp:    SpO2:  92%     Constitutional: Alert and oriented. Well appearing and in no apparent distress. HEENT:      Head: Normocephalic and atraumatic.         Eyes: Conjunctivae are normal. Sclera is non-icteric.       Mouth/Throat: Mucous membranes are moist.       Neck: Supple with no signs of meningismus. Cardiovascular: Regular rate and rhythm. No murmurs, gallops, or rubs. 2+ symmetrical distal pulses are present in all extremities.  Respiratory: Normal respiratory effort. Lungs are clear to auscultation bilaterally.  Gastrointestinal: Soft, non tender, and non distended with positive bowel sounds. No rebound or guarding. Genitourinary: No CVA tenderness. Musculoskeletal:  No edema, cyanosis, or erythema of extremities. Neurologic: Normal speech and language. Face is  symmetric. Moving all extremities. No gross focal neurologic deficits are appreciated. Skin: Skin is warm, dry and intact. No rash noted. Psychiatric: Mood and affect are normal. Speech and behavior are normal.  ED Results / Procedures / Treatments   Labs (all labs ordered are listed, but only abnormal results are displayed) Labs Reviewed  BASIC METABOLIC PANEL - Abnormal; Notable for the following components:      Result Value   Glucose, Bld 106 (*)    Calcium 8.7 (*)    All other components within normal limits  CBC - Abnormal; Notable for the following components:   WBC 11.2 (*)    Hemoglobin 10.1 (*)    HCT 35.4 (*)    MCV 71.1 (*)    MCH 20.3 (*)    MCHC 28.5 (*)    RDW 21.0 (*)    All other components within normal limits  URINALYSIS, COMPLETE (UACMP) WITH MICROSCOPIC - Abnormal; Notable for the following components:   Color, Urine YELLOW (*)    APPearance CLOUDY (*)    Ketones, ur 5 (*)    Leukocytes,Ua LARGE (*)    Bacteria, UA RARE (*)    All other components within normal limits  POC URINE PREG, ED  TROPONIN I (HIGH SENSITIVITY)  TROPONIN I (HIGH SENSITIVITY)     EKG  ED ECG REPORT I, Nita Sickle, the attending physician, personally viewed and interpreted this ECG.  Normal sinus rhythm, rate of 86, normal intervals, normal axis, no ST elevations or depressions.   RADIOLOGY none    PROCEDURES:  Critical Care performed: No  Procedures    IMPRESSION / MDM / ASSESSMENT AND PLAN / ED COURSE  I reviewed the triage vital signs and the nursing notes.   31 y.o. female with a history of seizure disorder, iron deficiency anemia, diabetes, bipolar disorder who presents to the emergency room for several medical complaints.  # seizure -patient is on Vimpat.  Not sure how often she has seizures.  Had a seizure in the beginning of the night.  Currently back to baseline with no neurological deficits.  No recent illnesses.  Looks well-appearing  otherwise.  #1 episode of lightheadedness with chest pain 2 nights ago.  She is concerned that her iron levels are low.  I instructed her to follow-up with her PCP to check her iron levels since this is not a test we do out of the emergency room but we will do an  EKG, troponin x1 since this episode was 48 hours ago, check CBC for any signs of anemia.  MEDICATIONS GIVEN IN ED: Medications - No data to display   ED COURSE: Hemoglobin is 10.1 which is the highest it has been in the last year for patient.  No signs of dehydration or electrolyte derangements, no AKI.  Pregnancy test is negative.  EKG and troponin are unremarkable.  UA with no signs of UTI.  Admission was considered but felt unnecessary since patient is fully back to her baseline.  Recommended close follow-up with her neurologist and discussed my standard return precautions.   Consults: None   EMR reviewed including last visit with her neurologist from October 2022 for seizure    FINAL CLINICAL IMPRESSION(S) / ED DIAGNOSES   Final diagnoses:  Seizure (HCC)  Chest pain, unspecified type     Rx / DC Orders   ED Discharge Orders     None        Note:  This document was prepared using Dragon voice recognition software and may include unintentional dictation errors.   Please note:  Patient was evaluated in Emergency Department today for the symptoms described in the history of present illness. Patient was evaluated in the context of the global COVID-19 pandemic, which necessitated consideration that the patient might be at risk for infection with the SARS-CoV-2 virus that causes COVID-19. Institutional protocols and algorithms that pertain to the evaluation of patients at risk for COVID-19 are in a state of rapid change based on information released by regulatory bodies including the CDC and federal and state organizations. These policies and algorithms were followed during the patient's care in the ED.  Some ED  evaluations and interventions may be delayed as a result of limited staffing during the pandemic.       Don Perking, [place], MD 11/18/21 [phone]

## 2021-12-16 ENCOUNTER — Inpatient Hospital Stay (HOSPITAL_COMMUNITY)
Admission: EM | Admit: 2021-12-16 | Discharge: 2021-12-16 | Disposition: A | Discharge status: Home / Self Care | Payer: Medicaid Other | Attending: Obstetrics and Gynecology | Admitting: Obstetrics and Gynecology

## 2021-12-16 ENCOUNTER — Other Ambulatory Visit: Payer: Self-pay

## 2021-12-16 DIAGNOSIS — Z3202 Encounter for pregnancy test, result negative: Secondary | ICD-10-CM

## 2021-12-16 LAB — HCG, QUANTITATIVE, PREGNANCY: hCG, Beta Chain, Quant, S: <1 m[IU]/mL (ref <5)

## 2021-12-16 LAB — POCT PREGNANCY, URINE: Preg Test, Ur: NEGATIVE

## 2021-12-16 NOTE — MAU Note (Addendum)
Patient left without signing AVS and without discharge instructions reviewed. ?

## 2021-12-16 NOTE — MAU Note (Signed)
...  Faith [name] is a 31 y.o. at Unknown here in MAU reporting: Positive pregnancy test at home but negative pregnancy test in MAU. Patient states she has been experiencing heavy vaginal bleeding and vaginal pressure that began last night.  ? ?Pain score:  ?8/10 vaginal pressure ? ?Lab orders placed from triage:  POCT Pregnancy  ? ?

## 2021-12-16 NOTE — MAU Provider Note (Signed)
S ?Faith [name] is a 31 y.o. [phone] female who presents to MAU today with complaint of VB and cramping. States she does not think she is pregnant and this is likely a period. ? ?ROS: ?+Vb ?+cramping ? ?O ?BP (!) 106/57 (BP Location: Right Arm)   Pulse 70   Temp 98.3 ?F (36.8 ?C) (Oral)   Resp 17   Ht 5' (1.524 m)   Wt 94.7 kg   SpO2 98%   BMI 40.78 kg/m?  ?Physical Exam ?Vitals and nursing note reviewed. Exam conducted with a chaperone present.  ?Constitutional:   ?   General: She is not in acute distress. ?   Appearance: Normal appearance.  ?HENT:  ?   Head: Normocephalic and atraumatic.  ?Cardiovascular:  ?   Rate and Rhythm: Normal rate.  ?Pulmonary:  ?   Effort: Pulmonary effort is normal. No respiratory distress.  ?Musculoskeletal:     ?   General: Normal range of motion.  ?Neurological:  ?   General: No focal deficit present.  ?   Mental Status: She is alert and oriented to person, place, and time.  ?Psychiatric:     ?   Mood and Affect: Mood normal.     ?   Behavior: Behavior normal.  ? ?Results for orders placed or performed during the hospital encounter of 12/16/21 (from the past 24 hour(s))  ?Pregnancy, urine POC     Status: None  ? Collection Time: 12/16/21  4:36 PM  ?Result Value Ref Range  ? Preg Test, Ur NEGATIVE NEGATIVE  ?hCG, quantitative, pregnancy     Status: None  ? Collection Time: 12/16/21  5:06 PM  ?Result Value Ref Range  ? hCG, Beta Chain, Quant, S <1 <5 mIU/mL  ? ?MDM: Pt reported to RN + home UPT, qhcg ordered and negative. Pt informed no signs of pregnancy or miscarriage. Recommend f/u with GYN outpt. Stable for discharge home.  ? ?A ?1. Negative pregnancy test   ? ?P ?Discharge from MAU in stable condition ?Warning signs for worsening condition that would warrant emergency follow-up discussed ?Patient may return to MAU as needed for pregnancy related complaints ? ?Donette Larry, CNM ?12/16/2021 6:53 PM  ? ?

## 2022-01-07 ENCOUNTER — Emergency Department (HOSPITAL_COMMUNITY)
Admission: EM | Admit: 2022-01-07 | Discharge: 2022-01-08 | Discharge status: Against Medical Advice | Payer: Medicaid Other | Attending: Emergency Medicine | Admitting: Emergency Medicine

## 2022-01-07 ENCOUNTER — Other Ambulatory Visit: Payer: Self-pay

## 2022-01-07 ENCOUNTER — Emergency Department (HOSPITAL_COMMUNITY): Payer: Medicaid Other

## 2022-01-07 ENCOUNTER — Encounter (HOSPITAL_COMMUNITY): Payer: Self-pay | Admitting: Emergency Medicine

## 2022-01-07 DIAGNOSIS — R200 Anesthesia of skin: Secondary | ICD-10-CM | POA: Insufficient documentation

## 2022-01-07 DIAGNOSIS — R0602 Shortness of breath: Secondary | ICD-10-CM | POA: Diagnosis not present

## 2022-01-07 DIAGNOSIS — R110 Nausea: Secondary | ICD-10-CM | POA: Diagnosis not present

## 2022-01-07 DIAGNOSIS — R079 Chest pain, unspecified: Secondary | ICD-10-CM | POA: Insufficient documentation

## 2022-01-07 DIAGNOSIS — Z5321 Procedure and treatment not carried out due to patient leaving prior to being seen by health care provider: Secondary | ICD-10-CM | POA: Diagnosis not present

## 2022-01-07 NOTE — ED Provider Triage Note (Signed)
Emergency Medicine Provider Triage Evaluation Note ? ?Marquis Lunch , a 31 y.o. female  was evaluated in triage.  Pt complains of cp. ? ?Review of Systems  ?Positive: Cp, sob, nausea, left arm tingling ?Negative: Fever, cough, dizzy, abd pain, back pain ? ?Physical Exam  ?Ht 5' (1.524 m)   Wt 90.7 kg   LMP 12/15/2021   BMI 39.06 kg/m?  ?Gen:   Awake, no distress   ?Resp:  Normal effort  ?MSK:   Moves extremities without difficulty  ?Other:   ? ?Medical Decision Making  ?Medically screening exam initiated at 11:40 PM.  Appropriate orders placed.  Avalin Denzler Fontan was informed that the remainder of the evaluation will be completed by another provider, this initial triage assessment does not replace that evaluation, and the importance of remaining in the ED until their evaluation is complete. ? ?Report substernal chest pressure and burning sensation with sob and L arm tingling which started a few hrs ago while at home.  No similar pain in the past.  Hx of heart Murmur and tobacco use.  ?  ?Domenic Moras, PA-C ?01/07/22 2341 ? ?

## 2022-01-07 NOTE — ED Triage Notes (Signed)
Pt c/o central chest pain with left arm numbness that started tonight.  ?

## 2022-01-08 LAB — BASIC METABOLIC PANEL
Anion gap: 8 (ref 5–15)
BUN: 6 mg/dL (ref 6–20)
CO2: 22 mmol/L (ref 22–32)
Calcium: 8.8 mg/dL — ABNORMAL LOW (ref 8.9–10.3)
Chloride: 108 mmol/L (ref 98–111)
Creatinine, Ser: 0.68 mg/dL (ref 0.44–1.00)
GFR, Estimated: >60 mL/min (ref >60)
Glucose, Bld: 107 mg/dL — ABNORMAL HIGH (ref 70–99)
Potassium: 3.1 mmol/L — ABNORMAL LOW (ref 3.5–5.1)
Sodium: 138 mmol/L (ref 135–145)

## 2022-01-08 LAB — I-STAT BETA HCG BLOOD, ED (MC, WL, AP ONLY): I-stat hCG, quantitative: <5 m[IU]/mL (ref <5)

## 2022-01-08 LAB — CBC
HCT: 34.7 % — ABNORMAL LOW (ref 36.0–46.0)
Hemoglobin: 9.6 g/dL — ABNORMAL LOW (ref 12.0–15.0)
MCH: 18.7 pg — ABNORMAL LOW (ref 26.0–34.0)
MCHC: 27.7 g/dL — ABNORMAL LOW (ref 30.0–36.0)
MCV: 67.6 fL — ABNORMAL LOW (ref 80.0–100.0)
Platelets: 388 10*3/uL (ref 150–400)
RBC: 5.13 MIL/uL — ABNORMAL HIGH (ref 3.87–5.11)
RDW: 18 % — ABNORMAL HIGH (ref 11.5–15.5)
WBC: 12.2 10*3/uL — ABNORMAL HIGH (ref 4.0–10.5)
nRBC: 0 % (ref 0.0–0.2)

## 2022-01-08 LAB — TROPONIN I (HIGH SENSITIVITY): Troponin I (High Sensitivity): 7 ng/L (ref <18)

## 2022-01-08 NOTE — ED Notes (Signed)
Patient called by this Clinical research associate and registration with no response and not visible in the lobby ?

## 2022-02-01 ENCOUNTER — Other Ambulatory Visit: Payer: Self-pay

## 2022-02-01 ENCOUNTER — Encounter (HOSPITAL_COMMUNITY): Payer: Self-pay | Admitting: Oncology

## 2022-02-01 ENCOUNTER — Emergency Department (HOSPITAL_COMMUNITY)
Admission: EM | Admit: 2022-02-01 | Discharge: 2022-02-01 | Disposition: A | Discharge status: Home / Self Care | Payer: Medicaid Other | Attending: Emergency Medicine | Admitting: Emergency Medicine

## 2022-02-01 DIAGNOSIS — J45909 Unspecified asthma, uncomplicated: Secondary | ICD-10-CM | POA: Insufficient documentation

## 2022-02-01 DIAGNOSIS — D649 Anemia, unspecified: Secondary | ICD-10-CM | POA: Insufficient documentation

## 2022-02-01 DIAGNOSIS — R112 Nausea with vomiting, unspecified: Secondary | ICD-10-CM | POA: Diagnosis present

## 2022-02-01 DIAGNOSIS — R102 Pelvic and perineal pain: Secondary | ICD-10-CM | POA: Diagnosis not present

## 2022-02-01 DIAGNOSIS — D72829 Elevated white blood cell count, unspecified: Secondary | ICD-10-CM | POA: Diagnosis not present

## 2022-02-01 DIAGNOSIS — R110 Nausea: Secondary | ICD-10-CM

## 2022-02-01 DIAGNOSIS — R1030 Lower abdominal pain, unspecified: Secondary | ICD-10-CM | POA: Insufficient documentation

## 2022-02-01 DIAGNOSIS — E119 Type 2 diabetes mellitus without complications: Secondary | ICD-10-CM | POA: Diagnosis not present

## 2022-02-01 DIAGNOSIS — Z91040 Latex allergy status: Secondary | ICD-10-CM | POA: Insufficient documentation

## 2022-02-01 LAB — COMPREHENSIVE METABOLIC PANEL
ALT: 11 U/L (ref 0–44)
AST: 11 U/L — ABNORMAL LOW (ref 15–41)
Albumin: 4.2 g/dL (ref 3.5–5.0)
Alkaline Phosphatase: 57 U/L (ref 38–126)
Anion gap: 6 (ref 5–15)
BUN: 12 mg/dL (ref 6–20)
CO2: 22 mmol/L (ref 22–32)
Calcium: 8.9 mg/dL (ref 8.9–10.3)
Chloride: 111 mmol/L (ref 98–111)
Creatinine, Ser: 0.57 mg/dL (ref 0.44–1.00)
GFR, Estimated: >60 mL/min (ref >60)
Glucose, Bld: 98 mg/dL (ref 70–99)
Potassium: 4 mmol/L (ref 3.5–5.1)
Sodium: 139 mmol/L (ref 135–145)
Total Bilirubin: 0.5 mg/dL (ref 0.3–1.2)
Total Protein: 7.5 g/dL (ref 6.5–8.1)

## 2022-02-01 LAB — URINALYSIS, ROUTINE W REFLEX MICROSCOPIC
Bacteria, UA: NONE SEEN
Bilirubin Urine: NEGATIVE
Glucose, UA: NEGATIVE mg/dL
Hgb urine dipstick: NEGATIVE
Ketones, ur: NEGATIVE mg/dL
Nitrite: NEGATIVE
Protein, ur: NEGATIVE mg/dL
Specific Gravity, Urine: 1.023 (ref 1.005–1.030)
pH: 5 (ref 5.0–8.0)

## 2022-02-01 LAB — CBC
HCT: 34.1 % — ABNORMAL LOW (ref 36.0–46.0)
Hemoglobin: 9.6 g/dL — ABNORMAL LOW (ref 12.0–15.0)
MCH: 18.6 pg — ABNORMAL LOW (ref 26.0–34.0)
MCHC: 28.2 g/dL — ABNORMAL LOW (ref 30.0–36.0)
MCV: 66 fL — ABNORMAL LOW (ref 80.0–100.0)
Platelets: 437 10*3/uL — ABNORMAL HIGH (ref 150–400)
RBC: 5.17 MIL/uL — ABNORMAL HIGH (ref 3.87–5.11)
RDW: 19.3 % — ABNORMAL HIGH (ref 11.5–15.5)
WBC: 10.8 10*3/uL — ABNORMAL HIGH (ref 4.0–10.5)
nRBC: 0 % (ref 0.0–0.2)

## 2022-02-01 LAB — IRON AND TIBC
Iron: 13 ug/dL — ABNORMAL LOW (ref 28–170)
Saturation Ratios: 3 % — ABNORMAL LOW (ref 10.4–31.8)
TIBC: 403 ug/dL (ref 250–450)
UIBC: 390 ug/dL

## 2022-02-01 LAB — WET PREP, GENITAL
Sperm: NONE SEEN
Trich, Wet Prep: NONE SEEN
WBC, Wet Prep HPF POC: <10 (ref <10)
Yeast Wet Prep HPF POC: NONE SEEN

## 2022-02-01 LAB — HCG, QUANTITATIVE, PREGNANCY: hCG, Beta Chain, Quant, S: <1 m[IU]/mL (ref <5)

## 2022-02-01 LAB — HIV ANTIBODY (ROUTINE TESTING W REFLEX): HIV Screen 4th Generation wRfx: NONREACTIVE

## 2022-02-01 LAB — FERRITIN: Ferritin: 2 ng/mL — ABNORMAL LOW (ref 11–307)

## 2022-02-01 LAB — LIPASE, BLOOD: Lipase: 36 U/L (ref 11–51)

## 2022-02-01 MED ORDER — OXYCODONE-ACETAMINOPHEN 5-325 MG PO TABS
1.0000 | ORAL_TABLET | Freq: Once | ORAL | Status: AC
  Administered 2022-02-01: 1 via ORAL
  Filled 2022-02-01: qty 1

## 2022-02-01 MED ORDER — ONDANSETRON 4 MG PO TBDP
4.0000 mg | ORAL_TABLET | Freq: Once | ORAL | Status: AC
  Administered 2022-02-01: 4 mg via ORAL
  Filled 2022-02-01: qty 1

## 2022-02-01 NOTE — ED Provider Notes (Signed)
[facility] ?[facility] DEPT ?Provider Note ? ? ?CSN: [id number] ?Arrival date & time: 02/01/22  1146 ? ?  ? ?History ? ?Chief Complaint  ?Patient presents with  ? Abdominal Pain  ? ? ?Faith [name] is a 31 y.o. female with a past medical history significant for diabetes, depression, asthma, bipolar, previous high risk pregnancy who presents with concern for sharp, stabbing lower abdominal pain for the last week.  She reports that she has some radiation to the flanks.  She endorses some light pink blood the last few days.  She has some concern for pregnancy.  She reports that her last menstrual period was April 8, not unusual for her.  She reports she has been sexually active since then.  She denies dyspareunia, vaginal discharge but would like to be tested for STIs.  Patient denies fever, chills.  She does endorse nausea, vomiting. ? ? ?Abdominal Pain ?Associated symptoms: nausea, vaginal bleeding and vomiting   ? ?  ? ?Home Medications ?Prior to Admission medications   ?Medication Sig Start Date End Date Taking? Authorizing Provider  ?Accu-Chek Softclix Lancets lancets 1 each by Other route 4 (four) times daily. 09/19/20   Will Bonnet, MD  ?acetaminophen (TYLENOL) 500 MG tablet Take 1,000 mg by mouth every 6 (six) hours as needed (for pain).    [provider]  ?Blood Glucose Monitoring Suppl (ACCU-CHEK NANO SMARTVIEW) w/Device KIT 1 kit by Subdermal route as directed. Check blood sugars for fasting, and two hours after breakfast, lunch and dinner (4 checks daily) 09/19/20   Will Bonnet, MD  ?docusate sodium (COLACE) 100 MG capsule Take 1 capsule (100 mg total) by mouth 2 (two) times daily as needed. 05/29/21   Homero Fellers, MD  ?Ferrous Sulfate (IRON) 325 (65 Fe) MG TABS Take 1 tablet (325 mg total) by mouth daily. 09/19/21   Rudene Re, MD  ?glucose blood (ACCU-CHEK SMARTVIEW) test strip Use as instructed to check blood sugars 09/19/20   Will Bonnet, MD  ?hydrocortisone (ANUSOL-HC) 25 MG suppository Place 1 suppository (25 mg total) rectally 2 (two) times daily. 04/01/21   Homero Fellers, MD  ?nystatin (MYCOSTATIN/NYSTOP) powder Apply 1 application topically 3 (three) times daily. 04/30/21   Homero Fellers, MD  ?   ? ?Allergies    ?Ivp dye [iodinated contrast media], Latex, Metrizamide, Morphine, Mushroom extract complex, Sulfasalazine, Tomato, Toradol [ketorolac tromethamine], Aspirin, Dilaudid [hydromorphone hcl], Lamictal [lamotrigine], Sulfa antibiotics, Tramadol, Adhesive [tape], and Iohexol   ? ?Review of Systems   ?Review of Systems  ?Gastrointestinal:  Positive for abdominal pain, nausea and vomiting.  ?Genitourinary:  Positive for vaginal bleeding.  ?All other systems reviewed and are negative. ? ?Physical Exam ?Updated Vital Signs ?BP (!) 106/55 (BP Location: Left Arm)   Pulse 65   Temp 98 ?F (36.7 ?C) (Oral)   Resp 18   Ht 5' (1.524 m)   Wt 90.3 kg   LMP 12/10/2021   SpO2 99%   BMI 38.86 kg/m?  ?Physical Exam ?Vitals and nursing note reviewed.  ?Constitutional:   ?   General: She is not in acute distress. ?   Appearance: Normal appearance. She is not ill-appearing.  ?HENT:  ?   Head: Normocephalic and atraumatic.  ?Eyes:  ?   General:     ?   Right eye: No discharge.     ?   Left eye: No discharge.  ?Cardiovascular:  ?   Rate and Rhythm: Normal  rate and regular rhythm.  ?   Heart sounds: No murmur heard. ?  No friction rub. No gallop.  ?Pulmonary:  ?   Effort: Pulmonary effort is normal.  ?   Breath sounds: Normal breath sounds.  ?Abdominal:  ?   General: Bowel sounds are normal.  ?   Palpations: Abdomen is soft.  ?   Comments: Patient endorses some tenderness to palpation suprapubically.  She has no rebound, rigidity, guarding.  I cannot palpate any masses externally.  No palpable uterine fundus.  I do not find any flank pain on exam today.  ?Genitourinary: ?   Comments: Normal appearance of external genitalia.  Some  tenderness suprapubically on bimanual exam but no cervical motion tenderness.  Did not note any adnexal fullness.  Normal appearance of cervix, small amounts of clear to whitish discharge. ?Skin: ?   General: Skin is warm and dry.  ?   Capillary Refill: Capillary refill takes less than 2 seconds.  ?Neurological:  ?   Mental Status: She is alert and oriented to person, place, and time.  ?Psychiatric:     ?   Mood and Affect: Mood normal.     ?   Behavior: Behavior normal.  ? ? ?ED Results / Procedures / Treatments   ?Labs ?(all labs ordered are listed, but only abnormal results are displayed) ?Labs Reviewed  ?WET PREP, GENITAL - Abnormal; Notable for the following components:  ?    Result Value  ? Clue Cells Wet Prep HPF POC PRESENT (*)   ? All other components within normal limits  ?CBC - Abnormal; Notable for the following components:  ? WBC 10.8 (*)   ? RBC 5.17 (*)   ? Hemoglobin 9.6 (*)   ? HCT 34.1 (*)   ? MCV 66.0 (*)   ? MCH 18.6 (*)   ? MCHC 28.2 (*)   ? RDW 19.3 (*)   ? Platelets 437 (*)   ? All other components within normal limits  ?COMPREHENSIVE METABOLIC PANEL - Abnormal; Notable for the following components:  ? AST 11 (*)   ? All other components within normal limits  ?URINALYSIS, ROUTINE W REFLEX MICROSCOPIC - Abnormal; Notable for the following components:  ? APPearance CLOUDY (*)   ? Leukocytes,Ua TRACE (*)   ? All other components within normal limits  ?IRON AND TIBC - Abnormal; Notable for the following components:  ? Iron 13 (*)   ? Saturation Ratios 3 (*)   ? All other components within normal limits  ?FERRITIN - Abnormal; Notable for the following components:  ? Ferritin 2 (*)   ? All other components within normal limits  ?LIPASE, BLOOD  ?HCG, QUANTITATIVE, PREGNANCY  ?HIV ANTIBODY (ROUTINE TESTING W REFLEX)  ?RPR  ?POC OCCULT BLOOD, ED  ?GC/CHLAMYDIA PROBE AMP ([place]) NOT AT [facility]  ? ? ?EKG ?None ? ?Radiology ?No results found. ? ?Procedures ?Procedures  ? ? ?Medications Ordered in  ED ?Medications  ?ondansetron (ZOFRAN-ODT) disintegrating tablet 4 mg (4 mg Oral Given 02/01/22 1244)  ?oxyCODONE-acetaminophen (PERCOCET/ROXICET) 5-325 MG per tablet 1 tablet (1 tablet Oral Given 02/01/22 1244)  ? ? ?ED Course/ Medical Decision Making/ A&P ?Clinical Course as of 02/01/22 1720  ?Sun Feb 01, 2022  ?1351 Hemoccult ordered and marked as collected mistakenly. Hemoccult not performed or collected [CP]  ?  ?Clinical Course User Index ?[CP] Kysen Wetherington H, PA-C  ? ?                        ?  Medical Decision Making ?Amount and/or Complexity of Data Reviewed ?Labs: ordered. ? ?Risk ?Prescription drug management. ? ? ?This patient presents to the ED for concern of suprapubic pain, nausea, concern for pregnancy, light vaginal spotting this involves an extensive number of treatment options, and is a complaint that carries with it a high risk of complications and morbidity. The emergent differential diagnosis prior to evaluation includes, but is not limited to, uti, pyelonephritis, early pregnancy, yeast infection, BV, PID, miscarriage, vs other intraabdominal causes for pain. Less clinical concern for acute ovarian torsion, TOA.  ? ?This is not an exhaustive differential.  ? ?Past Medical History / Co-morbidities / Social History: ?diabetes, depression, asthma, bipolar, previous high risk pregnancy ? ?Additional history: ?Chart reviewed. Pertinent results include: Reviewed outpatient OB/GYN, previous lab work, imaging from recent emergency department visits, outpatient neurology visits. ? ?Physical Exam: ?Physical exam performed. The pertinent findings include: pelvic exam unremarkable, reassuring. Non-palpable uterine fundus. No abdominal rebound, rigidity, guarding. Non-toxic appearing patient. No bleeding noted on my exam. ? ?Lab Tests: ?I ordered, and personally interpreted labs.  The pertinent results include:  CBC with mild leukocytosis, stable anemia of microcytic quality. Iron studies suggest iron  deficiency anemia. CMP unremarkable. Lipase negative. UA unremarkable, non-infectious. Wet prep with some clue cells, however patient primary complaint not about vaginal itching, odor, will not treat for BV at this time.

## 2022-02-01 NOTE — ED Notes (Signed)
I provided reinforced discharge education based off of discharge instructions. Pt acknowledged and understood my education. Pt had no further questions/concerns for provider/myself.  °

## 2022-02-01 NOTE — Discharge Instructions (Signed)
Benzoyl peroxide soap on inner thighs for boils. Follow up with continued pain or go to [facility] if pregnant and pregnancy concerns. ?

## 2022-02-01 NOTE — ED Notes (Signed)
Lab called asking for order/requisition for wet prep. Provider aware ?

## 2022-02-01 NOTE — ED Triage Notes (Signed)
Pt reports lower abdominal pain x one week. Pt reports light pink blood at the beginning of week. Does have concern for pregnancy. Also endorses b/l flank pain ? ?

## 2022-02-02 LAB — RPR: RPR Ser Ql: NONREACTIVE

## 2022-02-03 LAB — GC/CHLAMYDIA PROBE AMP (~~LOC~~) NOT AT ARMC
Chlamydia: NEGATIVE
Comment: NEGATIVE
Comment: NORMAL
Neisseria Gonorrhea: NEGATIVE

## 2022-03-20 ENCOUNTER — Encounter: Payer: Self-pay | Admitting: Licensed Practical Nurse

## 2022-03-20 ENCOUNTER — Ambulatory Visit (INDEPENDENT_AMBULATORY_CARE_PROVIDER_SITE_OTHER): Payer: Medicaid Other | Admitting: Licensed Practical Nurse

## 2022-03-20 VITALS — BP 124/60 | Ht 60.0 in | Wt 201.0 lb

## 2022-03-20 DIAGNOSIS — Z3169 Encounter for other general counseling and advice on procreation: Secondary | ICD-10-CM

## 2022-03-20 NOTE — Progress Notes (Unsigned)
Gynecology H&P  PCP: [name], MD  Chief Complaint:  Chief Complaint  Patient presents with   Follow-up    History of Present Illness: Patient is a 31 y.o. [phone] presenting for evaluation of infertility. Patient and partner have been attempting unprotected intercourse for {numbers; 0-10:33138} {Time; units w/plural:11} and actively trying for conception for {numbers; 0-10:33138} {Time; units w/plural:11}.  Pregnancies with current partner {yes/no:63}  Sexual History Frequency: {numbers (fuzzy):14653} times per {time frame:9076} Dyspareunia: {yes deep:14828} Use of Lubricant: {yes/no:63} Douching: {yes/no:63} Number of lifetime sex partners: {numbers 0-20:10930}  Ovulatory Evaluation LMP: Patient's last menstrual period was 03/14/2022 (exact date). Menarche:{numbers 8-41:66063} Interval {Blank single:19197::"regular","irregular"} {numbers 22-35:14824}  days Duration of flow: {numbers; 0-10:33138} days Heavy Menses: {yes/no:63} Clots: {yes/no:63} Intermenstrual Bleeding: {yes/no:63} Dysmenorrhea: {yes/no:63}  [place] {yes/no:63} Ovulation Predictor Kits Positive  {yes/no/unknown:74}  PCOS  Wt Change: {inf wt change:14817} Hirsutism: {yes/no:63} Acne: {yes/no:63}  Hyperprolactinemia Galactorrhea: {yes/no:63} Headaches: {yes/no:63} Vision Changes:  {yes/no:63}  Thyroid Temperature Intolerance: {yes/no:63} Constipation or Diarrhea: {yes/no:63} Hair Thinning:  {yes/no:63} Palpitation:  {yes/no:63}  Prior treatments Meds: {infmeds:14695} Other Therapies: {infertility art procedures:14668}  Premature Ovarian Failure Family history of autism, mental retardation, or fragile X: {yes/no:63} Family history of premature ovarian failure or early menopause: {yes/no:63} Prior radiation or chemotherapy exposoure:  {yes/no:63}  Tubal Factor Previous abdominal or pelvic surgery: {yes/no:63} Pelvic Pain:  {yes/no:63} Endometriosis: {yes/no:63} STD:  {yes/no:63} PID: {yes/no/unknown:74} Laparoscopy: {yes/no:63} Prior HSG: {yes/no:63}  Female Factor Sired prior conception:  {yes/no:63} Semen analysis: {yes/no:63}  Contraception {infertility contracep types:14820}  Contributing Habits Cigarettes:    Wife -  {inf yes/and  pack numbers:14797}    Husband - {inf yes/and  pack numbers:14797} Alcohol:    Wife -  {inf yes KZSW:10932}    Husband - {inf yes TFTD:32202} Marijuana: {Desc; never/past/current/social/denies:32237}  Review of Systems: 10 point review of systems negative unless otherwise noted in HPI  Past Medical History:  Patient Active Problem List   Diagnosis Date Noted   History of cesarean section 03/27/2021   Diabetes mellitus affecting pregnancy, unspecified trimester 09/19/2020   Hidradenitis suppurativa 05/24/2018   Bipolar disorder ([place]) 10/01/2017   History of cesarean delivery 07/12/2017    Arrest of dilation at 7cm after 3 day IOL Baby 8#7oz Blood transfusion postpartum    Family history of Huntington's disease    Asthma affecting pregnancy, antepartum 05/10/2017   Smoking (tobacco) complicating pregnancy, unspecified trimester 05/10/2017   Seizure disorder during pregnancy, third trimester ([place]) 05/10/2017   History of anemia 04/19/2017   History of maternal blood transfusion, currently pregnant 04/19/2017   ADD (attention deficit disorder) 07/17/2015   Post traumatic stress disorder (PTSD) 06/16/2013   Depression 06/15/2013   Suicidal ideation 06/15/2013   Status epilepticus ([place]) 06/14/2013   Diabetes mellitus type 2 in obese ([place]) 06/14/2013    Past Surgical History:  Past Surgical History:  Procedure Laterality Date   CESAREAN SECTION N/A 06/22/2016   Procedure: CESAREAN SECTION;  Surgeon: Malachy Mood, MD;  Location: [facility] ORS;  Service: Obstetrics;  Laterality: N/A;   CESAREAN SECTION N/A 10/12/2017   Procedure: CESAREAN SECTION;  Surgeon: Malachy Mood, MD;  Location: [facility] ORS;   Service: Obstetrics;  Laterality: N/A;   CESAREAN SECTION N/A 03/27/2021   Procedure: CESAREAN SECTION;  Surgeon: Homero Fellers, MD;  Location: [facility] ORS;  Service: Obstetrics;  Laterality: N/A;   CHOLECYSTECTOMY N/A 08/13/2016   Procedure: LAPAROSCOPIC CHOLECYSTECTOMY;  Surgeon: Jules Husbands, MD;  Location: [facility] ORS;  Service: General;  Laterality: N/A;  DILATION AND CURETTAGE OF UTERUS     ovarian cyst removed     SALPINGECTOMY Right 2013   ectopic pregnancy. PER PATIENT, STILL HAS BOTH TUBES   TONSILLECTOMY     TONSILLECTOMY AND ADENOIDECTOMY      Obstetric History:  {infertility ob hx:14818}  Family History:  Family History  Problem Relation Age of Onset   Heart disease Father    Huntington's disease Mother    Thyroid Problems: {yes/no/unknown:74} Heart Condition or High Blood Pressure: {yes/no/unknown:74} Blood Clot or Stroke: {yes/no/unknown:74} Diabetes: {yes/no/unknown:74} Cancer: {yes/no/unknown:74} Birth Defects/Inherited diseases:{yes/no/unknown:74} Infectious diseases (mumps, TB, Rubella):{yes/no/unknown:74} Other Medical Problems: {yes/no/unknown:74}  Social History:  Social History   Socioeconomic History   Marital status: Significant Other    Spouse name: Not on file   Number of children: 2   Years of education: Not on file   Highest education level: Not on file  Occupational History   Not on file  Tobacco Use   Smoking status: Every Day    Packs/day: 0.50    Years: 7.00    Total pack years: 3.50    Types: Cigarettes   Smokeless tobacco: Never  Vaping Use   Vaping Use: Never used  Substance and Sexual Activity   Alcohol use: Not Currently    Alcohol/week: 0.0 standard drinks of alcohol   Drug use: No   Sexual activity: Yes    Birth control/protection: None  Other Topics Concern   Not on file  Social History Narrative   Not on file   Social Determinants of Health   Financial Resource Strain: Not on file  Food Insecurity: Not on  file  Transportation Needs: Not on file  Physical Activity: Not on file  Stress: Not on file  Social Connections: Not on file  Intimate Partner Violence: Not on file    Allergies:  Allergies  Allergen Reactions   Ivp Dye [Iodinated Contrast Media] Anaphylaxis    Per patient, she does not have problems with betadine   Latex Rash   Metrizamide Anaphylaxis   Morphine Other (See Comments)    bradycradia    Mushroom Extract Complex Anaphylaxis    Hives then swelling of throat and can not breath   Sulfasalazine Hives   Tomato Anaphylaxis   Toradol [Ketorolac Tromethamine] Anaphylaxis, Hives and Swelling   Aspirin Hives   Dilaudid [Hydromorphone Hcl] Hives   Lamictal [Lamotrigine] Hives   Sulfa Antibiotics Hives   Tramadol Swelling, Rash and Hives   Adhesive [Tape] Itching    USE PAPER TAPE   Iohexol Rash    (contrast dye) causes red bumps    Medications: Prior to Admission medications   Medication Sig Start Date End Date Taking? Authorizing Provider  Accu-Chek Softclix Lancets lancets 1 each by Other route 4 (four) times daily. 09/19/20  Yes Will Bonnet, MD  Blood Glucose Monitoring Suppl (ACCU-CHEK NANO SMARTVIEW) w/Device KIT 1 kit by Subdermal route as directed. Check blood sugars for fasting, and two hours after breakfast, lunch and dinner (4 checks daily) 09/19/20  Yes Will Bonnet, MD  Ferrous Sulfate (IRON) 325 (65 Fe) MG TABS Take 1 tablet (325 mg total) by mouth daily. 09/19/21  Yes Alfred Levins, [place], MD  glucose blood (ACCU-CHEK SMARTVIEW) test strip Use as instructed to check blood sugars 09/19/20  Yes Will Bonnet, MD  acetaminophen (TYLENOL) 500 MG tablet Take 1,000 mg by mouth every 6 (six) hours as needed (for pain). Patient not taking: Reported on 03/20/2022    [provider]  docusate sodium (COLACE) 100 MG capsule Take 1 capsule (100 mg total) by mouth 2 (two) times daily as needed. Patient not taking: Reported on 03/20/2022 05/29/21    Homero Fellers, MD  hydrocortisone (ANUSOL-HC) 25 MG suppository Place 1 suppository (25 mg total) rectally 2 (two) times daily. Patient not taking: Reported on 03/20/2022 04/01/21   Homero Fellers, MD  nystatin (MYCOSTATIN/NYSTOP) powder Apply 1 application topically 3 (three) times daily. Patient not taking: Reported on 03/20/2022 04/30/21   Homero Fellers, MD    Physical Exam Vitals: Blood pressure 124/60, height 5' (1.524 m), weight 201 lb (91.2 kg), last menstrual period 03/14/2022, currently breastfeeding.  General: NAD HEENT: normocephalic, anicteric Pulmonary: No increased work of breathing Cardiovascular: RRR, distal pulses 2+ Abdomen: *** Genitourinary: *** Extremities: no edema, erythema, or tenderness Neurologic: Grossly intact Psychiatric: mood appropriate, affect full  * No order type specified *  Assessment: 31 y.o. [id number] presenting for initial infertility evaluatoin Plan: 1) We discussed the underlying etiologies which may be implicated in a couple experiencing difficulty conceiving.  The average couple will conceive within the span of 1 year with unprotected coitus, with a monthly fecundity rate of 20% or 1 in 5.  Even without further work up or intervention the patient and her partner may be successful in conceiving unassisted, although if an underlying etiology can be identified and addressed fecundity rate may improve.  The work up entails examining for ovulatory function, tubal patency, and ruling out female factor infertility.  These may be looked at concurrently or sequentially.  The downside of sequential work up is that this method may miss issues if more than one compartment is contributing.  She is aware that tubal factor or moderate to severe female factor infertility will require further consultation with a reproductive endocrinologist.  In the case of anovulation, use of Clomid (clomiphen citrate) or Femara (letrazole) were discussed with the  understanding the the later is an off-label, but well supported use.  With either of these drugs the risk of multiples increases from the standard population rate of 2% to approximately 10%, with higher order multiples possible but unlikely.  Both drugs may require some time to titrate to the appropriate dosage to ensure consistent ovulation.  Cycles will be limited to 6 cycles on each drug secondary to decreasing rates of conception after 6 cycles.  In addition should patient be started on ovulation induction with Clomid she was advised to discontinue the drug for any vision changes as this is a rare but potentially permanent side-effect if medication is continued.  We discussed timing of intercourse as well as the use of ovulation predictor kits identify the patient's fertile window each month.     2) Preconception counseling*** - immunization up to date.  The patient denies any family history of conditions which would warrant preconception genetic counseling or testing on her or her partner.  Instructed to start prenatal vitamins while trying to conceive.    3) Return in about 3 months (around 06/20/2022) for Dr Amalia Hailey.    Malachy Mood, MD, [facility], [facility] 03/20/2022, 11:37 AM

## 2022-04-21 ENCOUNTER — Telehealth: Payer: Self-pay

## 2022-04-21 NOTE — Telephone Encounter (Signed)
Called patient per referral about becoming a patient at [facility]; she has insurance and no longer lives in [place]

## 2022-05-01 ENCOUNTER — Encounter: Payer: Self-pay | Admitting: Family Medicine

## 2022-05-13 ENCOUNTER — Inpatient Hospital Stay: Payer: Medicaid Other

## 2022-05-13 ENCOUNTER — Inpatient Hospital Stay: Payer: Medicaid Other | Attending: Internal Medicine | Admitting: Internal Medicine

## 2022-05-13 ENCOUNTER — Encounter: Payer: Self-pay | Admitting: Internal Medicine

## 2022-05-13 DIAGNOSIS — N921 Excessive and frequent menstruation with irregular cycle: Secondary | ICD-10-CM | POA: Diagnosis not present

## 2022-05-13 DIAGNOSIS — D509 Iron deficiency anemia, unspecified: Secondary | ICD-10-CM | POA: Diagnosis not present

## 2022-05-13 DIAGNOSIS — F17210 Nicotine dependence, cigarettes, uncomplicated: Secondary | ICD-10-CM

## 2022-05-13 DIAGNOSIS — D500 Iron deficiency anemia secondary to blood loss (chronic): Secondary | ICD-10-CM

## 2022-05-13 HISTORY — DX: Excessive and frequent menstruation with irregular cycle: N92.1

## 2022-05-13 NOTE — Assessment & Plan Note (Signed)
-   following with OBGYN  - discussed likely cause of IDA is heavy menstrual periods associated with blood clots.

## 2022-05-13 NOTE — Assessment & Plan Note (Addendum)
-   Likely source metro-menorrhagia. - Labs were reviewed.  Patient has very low iron stores.  Hemoglobin is 8.5. -We will proceed with IV Feraheme weekly x 2 doses.  Side effects with lower potential risk of anaphylactic reaction was discussed and patient was okay to proceed. -She had trial of oral iron in the past with no improvement in iron stores. -Repeat iron panel and CBC in 6 weeks - following with OBGYN. If she continues to have low iron stores after improvement in menstrual periods, will consider GI work up.

## 2022-05-13 NOTE — Progress Notes (Signed)
[place]  [phone]) [phone] Fax:(336) [phone]  ID: Marquis Lunch OB: [birth year]  MR#: [id number]  GYK#:[id number]  Patient Care Team: [name], MD as PCP - General (Family Medicine)  REASON FOR REFERRAL: Iron deficiency anemia  HPI: Faith [name] is a 31 y.o. female with past medical history as below Patient reported lack of energy and increased sleeping.  Patient has history of iron deficiency anemia for long time now.  She reports metromenorrhagia with heavy menstrual bleeding at times requiring her to change pads or tampons every couple of hours.  Also associated with blood clots.  She has tried oral contraceptive and progesterone Depo in the past but had swelling.  She has 3 children with last pregnancy 1 year ago.  She is again trying to get pregnant.  She is followed by OB GYN for menstrual bleeding. She reports rare bleeding in stool when constipated. Denies gum bleeding, nosebleeding or bleeding in urine.  Denies gastric bypass surgery. She has tried oral iron but with no improvement in iron stores.   REVIEW OF SYSTEMS:   ROS  As per HPI. Otherwise, a complete review of systems is negative.  PAST MEDICAL HISTORY: Past Medical History:  Diagnosis Date   ADHD    Anemia    BLOOD TRANSFUSION AFTER C-SECTION ON 06-2016-2 UNITS   Anxiety    Asthma    WELL CONTROLLED   Bipolar 1 disorder, depressed ([place])    COVID-19    Depression    Diabetes mellitus without complication (HCC)    Gallstones    GERD (gastroesophageal reflux disease)    Heart murmur    History of blood transfusion 06/2016   RECEIVED 2 UNITS OF BLOOD AFTER C SECTION   Migraines    Ovarian cyst    Seizures ([place])    LAST SEIZURE January 2022   Tubal pregnancy     PAST SURGICAL HISTORY: Past Surgical History:  Procedure Laterality Date   CESAREAN SECTION N/A 06/22/2016   Procedure: CESAREAN SECTION;  Surgeon: Malachy Mood, MD;  Location: [facility] ORS;  Service:  Obstetrics;  Laterality: N/A;   CESAREAN SECTION N/A 10/12/2017   Procedure: CESAREAN SECTION;  Surgeon: Malachy Mood, MD;  Location: [facility] OR[facility];  Service: Obstetrics;  Laterality: N/A;   CESAREAN SECTION N/A 03/27/2021   Procedure: CESAREAN SECTION;  Surgeon: Homero Fellers, MD;  Location: [facility] ORS;  Service: Obstetrics;  Laterality: N/A;   CHOLECYSTECTOMY N/A 08/13/2016   Procedure: LAPAROSCOPIC CHOLECYSTECTOMY;  Surgeon: Jules Husbands, MD;  Location: [facility] ORS;  Service: General;  Laterality: N/A;   DILATION AND CURETTAGE OF UTERUS     ovarian cyst removed     SALPINGECTOMY Right 2013   ectopic pregnancy. PER PATIENT, STILL HAS BOTH TUBES   TONSILLECTOMY     TONSILLECTOMY AND ADENOIDECTOMY      FAMILY HISTORY: Family History  Problem Relation Age of Onset   Heart disease Father    Huntington's disease Mother     ADVANCED DIRECTIVES (Y/N):  N  HEALTH MAINTENANCE: Social History   Tobacco Use   Smoking status: Every Day    Packs/day: 0.50    Years: 7.00    Total pack years: 3.50    Types: Cigarettes   Smokeless tobacco: Never  Vaping Use   Vaping Use: Never used  Substance Use Topics   Alcohol use: Not Currently    Alcohol/week: 0.0 standard drinks of alcohol   Drug use: No     Colonoscopy:  PAP:  Bone density:  Lipid panel:  Allergies  Allergen Reactions   Ivp Dye [Iodinated Contrast Media] Anaphylaxis    Per patient, she does not have problems with betadine   Latex Rash   Metrizamide Anaphylaxis   Morphine Other (See Comments)    bradycradia    Mushroom Extract Complex Anaphylaxis    Hives then swelling of throat and can not breath   Sulfasalazine Hives   Tomato Anaphylaxis   Toradol [Ketorolac Tromethamine] Anaphylaxis, Hives and Swelling   Aspirin Hives   Dilaudid [Hydromorphone Hcl] Hives   Lamictal [Lamotrigine] Hives   Sulfa Antibiotics Hives   Tramadol Swelling, Rash and Hives   Adhesive [Tape] Itching    USE PAPER TAPE   Iohexol Rash     (contrast dye) causes red bumps    Current Outpatient Medications  Medication Sig Dispense Refill   Accu-Chek Softclix Lancets lancets 1 each by Other route 4 (four) times daily. 100 each 12   acetaminophen (TYLENOL) 500 MG tablet Take 1,000 mg by mouth every 6 (six) hours as needed (for pain).     alprazolam (XANAX) 2 MG tablet Take by mouth.     amphetamine-dextroamphetamine (ADDERALL) 15 MG tablet Take 1 tablet by mouth 2 (two) times daily.     Blood Glucose Monitoring Suppl (ACCU-CHEK NANO SMARTVIEW) w/Device KIT 1 kit by Subdermal route as directed. Check blood sugars for fasting, and two hours after breakfast, lunch and dinner (4 checks daily) 1 kit 0   DULoxetine (CYMBALTA) 30 MG capsule Take 30 mg by mouth daily.     glucose blood (ACCU-CHEK SMARTVIEW) test strip Use as instructed to check blood sugars 100 each 12   ibuprofen (ADVIL) 600 MG tablet Take 600 mg by mouth every 6 (six) hours as needed.     Lacosamide (VIMPAT) 150 MG TABS TAKE 1 TABLET BY MOUTH TWICE DAILY FOR SEIZURE DISORDER     PARoxetine (PAXIL) 20 MG tablet SMARTSIG:1 Tablet(s) By Mouth Morning-Evening     Prenatal Vit-Fe Fumarate-FA (WESTAB PLUS) 27-1 MG TABS Take by mouth.     traZODone (DESYREL) 50 MG tablet TAKE 1 TABLET BY MOUTH EVERY NIGHT AT BEDTIME AS NEEDED FOR INSOMNIA. INCREASE TO 2 TABLET AS NEEDED AFTER 4-5 DAYS     hydrocortisone (ANUSOL-HC) 25 MG suppository Place 1 suppository (25 mg total) rectally 2 (two) times daily. (Patient not taking: Reported on 05/13/2022) 12 suppository 1   nystatin (MYCOSTATIN/NYSTOP) powder Apply 1 application topically 3 (three) times daily. (Patient not taking: Reported on 03/20/2022) 15 g 0   No current facility-administered medications for this visit.    OBJECTIVE: Vitals:   05/13/22 1120  BP: 106/69  Pulse: 80  Resp: 18  Temp: 98 F (36.7 C)  SpO2: 99%     Body mass index is 38.79 kg/m.      General: Well-developed, well-nourished, no acute distress. Eyes:  Pink conjunctiva, anicteric sclera. HEENT: Normocephalic, moist mucous membranes, clear oropharnyx. Lungs: Clear to auscultation bilaterally. Heart: Regular rate and rhythm. No rubs, murmurs, or gallops. Abdomen: Soft, nontender, nondistended. No organomegaly noted, normoactive bowel sounds. Musculoskeletal: No edema, cyanosis, or clubbing. Neuro: Alert, answering all questions appropriately. Cranial nerves grossly intact. Skin: No rashes or petechiae noted. Psych: Normal affect. Lymphatics: No cervical, calvicular, axillary or inguinal LAD.   LAB RESULTS:  Lab Results  Component Value Date   NA 139 02/01/2022   K 4.0 02/01/2022   CL 111 02/01/2022   CO2 22 02/01/2022   GLUCOSE 98 02/01/2022  BUN 12 02/01/2022   CREATININE 0.57 02/01/2022   CALCIUM 8.9 02/01/2022   PROT 7.5 02/01/2022   ALBUMIN 4.2 02/01/2022   AST 11 (L) 02/01/2022   ALT 11 02/01/2022   ALKPHOS 57 02/01/2022   BILITOT 0.5 02/01/2022   GFRNONAA >60 02/01/2022   GFRAA >60 06/05/2020    Lab Results  Component Value Date   WBC 10.8 (H) 02/01/2022   NEUTROABS 11.2 (H) 03/27/2021   HGB 9.6 (L) 02/01/2022   HCT 34.1 (L) 02/01/2022   MCV 66.0 (L) 02/01/2022   PLT 437 (H) 02/01/2022    Lab Results  Component Value Date   TIBC 403 02/01/2022   FERRITIN 2 (L) 02/01/2022   IRONPCTSAT 3 (L) 02/01/2022     STUDIES: No results found.  ASSESSMENT AND PLAN:   Faith [name] is a 31 y.o. female was referred to hematology clinic for management of iron deficiency anemia.  Iron deficiency anemia - Likely source metro-menorrhagia. - Labs were reviewed.  Patient has very low iron stores.  Hemoglobin is 8.5. -We will proceed with IV Feraheme weekly x 2 doses.  Side effects with lower potential risk of anaphylactic reaction was discussed and patient was okay to proceed. -She had trial of oral iron in the past with no improvement in iron stores. -Repeat iron panel and CBC in 6 weeks - following with OBGYN. If  she continues to have low iron stores after improvement in menstrual periods, will consider GI work up.    Menometrorrhagia - following with OBGYN  - discussed likely cause of IDA is heavy menstrual periods associated with blood clots.   Patient expressed understanding and was in agreement with this plan. She also understands that She can call clinic at any time with any questions, concerns, or complaints.   I spent a total of 30 minutes reviewing chart data, face-to-face evaluation with the patient, counseling and coordination of care as detailed above.  Jane Canary, MD   05/13/2022 1:42 PM

## 2022-05-18 ENCOUNTER — Encounter: Payer: Self-pay | Admitting: Nurse Practitioner

## 2022-05-18 ENCOUNTER — Other Ambulatory Visit: Payer: Self-pay

## 2022-05-18 ENCOUNTER — Other Ambulatory Visit: Payer: Self-pay | Admitting: *Deleted

## 2022-05-18 ENCOUNTER — Inpatient Hospital Stay: Payer: Medicaid Other

## 2022-05-18 ENCOUNTER — Emergency Department
Admission: EM | Admit: 2022-05-18 | Discharge: 2022-05-18 | Disposition: A | Discharge status: Home / Self Care | Payer: Medicaid Other | Attending: Emergency Medicine | Admitting: Emergency Medicine

## 2022-05-18 VITALS — BP 111/82 | HR 92 | Temp 98.8°F | Resp 16

## 2022-05-18 DIAGNOSIS — G40909 Epilepsy, unspecified, not intractable, without status epilepticus: Secondary | ICD-10-CM | POA: Insufficient documentation

## 2022-05-18 DIAGNOSIS — J45909 Unspecified asthma, uncomplicated: Secondary | ICD-10-CM | POA: Insufficient documentation

## 2022-05-18 DIAGNOSIS — N921 Excessive and frequent menstruation with irregular cycle: Secondary | ICD-10-CM | POA: Diagnosis not present

## 2022-05-18 DIAGNOSIS — Z8616 Personal history of COVID-19: Secondary | ICD-10-CM | POA: Diagnosis not present

## 2022-05-18 DIAGNOSIS — E119 Type 2 diabetes mellitus without complications: Secondary | ICD-10-CM | POA: Insufficient documentation

## 2022-05-18 DIAGNOSIS — D509 Iron deficiency anemia, unspecified: Secondary | ICD-10-CM | POA: Diagnosis present

## 2022-05-18 DIAGNOSIS — R569 Unspecified convulsions: Secondary | ICD-10-CM

## 2022-05-18 DIAGNOSIS — D500 Iron deficiency anemia secondary to blood loss (chronic): Secondary | ICD-10-CM

## 2022-05-18 LAB — BASIC METABOLIC PANEL
Anion gap: 8 (ref 5–15)
BUN: 13 mg/dL (ref 6–20)
CO2: 21 mmol/L — ABNORMAL LOW (ref 22–32)
Calcium: 8.6 mg/dL — ABNORMAL LOW (ref 8.9–10.3)
Chloride: 107 mmol/L (ref 98–111)
Creatinine, Ser: 0.64 mg/dL (ref 0.44–1.00)
GFR, Estimated: >60 mL/min (ref >60)
Glucose, Bld: 92 mg/dL (ref 70–99)
Potassium: 4.8 mmol/L (ref 3.5–5.1)
Sodium: 136 mmol/L (ref 135–145)

## 2022-05-18 LAB — CBC WITH DIFFERENTIAL/PLATELET
Abs Immature Granulocytes: 0.07 10*3/uL (ref 0.00–0.07)
Basophils Absolute: 0.1 10*3/uL (ref 0.0–0.1)
Basophils Relative: 1 %
Eosinophils Absolute: 0.5 10*3/uL (ref 0.0–0.5)
Eosinophils Relative: 4 %
HCT: 31.1 % — ABNORMAL LOW (ref 36.0–46.0)
Hemoglobin: 8.5 g/dL — ABNORMAL LOW (ref 12.0–15.0)
Immature Granulocytes: 1 %
Lymphocytes Relative: 34 %
Lymphs Abs: 4.2 10*3/uL — ABNORMAL HIGH (ref 0.7–4.0)
MCH: 17.8 pg — ABNORMAL LOW (ref 26.0–34.0)
MCHC: 27.3 g/dL — ABNORMAL LOW (ref 30.0–36.0)
MCV: 65.1 fL — ABNORMAL LOW (ref 80.0–100.0)
Monocytes Absolute: 0.9 10*3/uL (ref 0.1–1.0)
Monocytes Relative: 7 %
Neutro Abs: 6.8 10*3/uL (ref 1.7–7.7)
Neutrophils Relative %: 53 %
Platelets: 345 10*3/uL (ref 150–400)
RBC: 4.78 MIL/uL (ref 3.87–5.11)
RDW: 18.7 % — ABNORMAL HIGH (ref 11.5–15.5)
Smear Review: NORMAL
WBC: 12.6 10*3/uL — ABNORMAL HIGH (ref 4.0–10.5)
nRBC: 0 % (ref 0.0–0.2)

## 2022-05-18 LAB — PREGNANCY, URINE: Preg Test, Ur: NEGATIVE

## 2022-05-18 MED ORDER — LACOSAMIDE 100 MG PO TABS: 100.0000 mg | ORAL_TABLET | Freq: Two times a day (BID) | ORAL | 0 refills | Status: DC

## 2022-05-18 MED ORDER — DIPHENHYDRAMINE HCL 50 MG/ML IJ SOLN
50.0000 mg | Freq: Once | INTRAMUSCULAR | Status: AC | PRN
  Administered 2022-05-18: 25 mg via INTRAVENOUS

## 2022-05-18 MED ORDER — SODIUM CHLORIDE 0.9 % IV SOLN
Freq: Once | INTRAVENOUS | Status: AC
  Filled 2022-05-18: qty 250

## 2022-05-18 MED ORDER — LACOSAMIDE 50 MG PO TABS
200.0000 mg | ORAL_TABLET | Freq: Once | ORAL | Status: AC
  Administered 2022-05-18: 200 mg via ORAL
  Filled 2022-05-18: qty 4

## 2022-05-18 MED ORDER — FAMOTIDINE IN NACL 20-0.9 MG/50ML-% IV SOLN
20.0000 mg | Freq: Once | INTRAVENOUS | Status: AC | PRN
  Administered 2022-05-18: 20 mg via INTRAVENOUS

## 2022-05-18 MED ORDER — METHYLPREDNISOLONE SODIUM SUCC 125 MG IJ SOLR
125.0000 mg | Freq: Once | INTRAMUSCULAR | Status: AC | PRN
  Administered 2022-05-18: 125 mg via INTRAVENOUS

## 2022-05-18 MED ORDER — SODIUM CHLORIDE 0.9 % IV SOLN
510.0000 mg | Freq: Once | INTRAVENOUS | Status: AC
  Administered 2022-05-18: 510 mg via INTRAVENOUS
  Filled 2022-05-18: qty 17

## 2022-05-18 MED ORDER — SODIUM CHLORIDE 0.9 % IV SOLN
Freq: Once | INTRAVENOUS | Status: DC | PRN
  Filled 2022-05-18: qty 250

## 2022-05-18 NOTE — Progress Notes (Signed)
Called to infusion for possible reaction to feraheme. Patient had nearly completed infusion when she complained of chest pressure and 'feeling hot'. While at chairside, patient was noted to have strabismus, altered mental status. Patient received solumedrol, benadryl, and pepcid for possible allergic reaction to feraheme. She was minimally responding to verbal stimuli, kept eyes closed. Vitals were stable. No loss of breathing or pulse. Clinically, not typical for allergic reaction. Per significant other at chairside, patient has history of seizures and has been noncompliant with vimpat. She is followed by neurology/Dr. Malvin Johns. Strong suspicion for absence seizure. Recommended evaluation in ER. Family agreed. Patient became post-ictal, drowsy, slowly became more alert. EMS was called to transport patient. Charge nurse updated and given report. Nursing provided report to paramedics.

## 2022-05-18 NOTE — ED Provider Notes (Signed)
[facility] Provider Note    Event Date/Time   First MD Initiated Contact with Patient 05/18/22 1716     (approximate)   History   Seizures (Per ems pt has absent seizures, seizure when she falls asleep, )   HPI  Faith [name] is a 31 y.o. female with past medical history of epilepsy, bipolar disorder, anxiety, anemia who presents for possible seizure versus drug reaction.  Patient was at the cancer center getting an iron infusion.  Her significant other says she started to feel flushed and have some chest pressure and then eyes shut and she had twitching movements that lasted for several minutes.  Per documentation from the NP at the cancer center patient complained of feeling hot then seemingly altered and was noted to have strabismus on exam.  Vitals remained stable and she was given Benadryl Pepcid and Solu-Medrol for possible allergic reaction to the iron.  Patient's partner says that this is very similar to how she appears when she has a seizure which she does nearly every night.  She is prescribed Vimpat which she has not had in over a month follows with Dr. Sherryll Burger and Dr. Malvin Johns.  Patient does not remember the event.  She denies any new neurologic symptoms including new headache visual change numbness tingling weakness no new recent illnesses including fevers chills cough abdominal pain.  She had a negative pregnancy test at the cancer center today.     Past Medical History:  Diagnosis Date   ADHD    Anemia    BLOOD TRANSFUSION AFTER C-SECTION ON 06-2016-2 UNITS   Anxiety    Asthma    WELL CONTROLLED   Bipolar 1 disorder, depressed (HCC)    COVID-19    Depression    Diabetes mellitus without complication (HCC)    Gallstones    GERD (gastroesophageal reflux disease)    Heart murmur    History of blood transfusion 06/2016   RECEIVED 2 UNITS OF BLOOD AFTER C SECTION   Migraines    Ovarian cyst    Seizures (HCC)    LAST SEIZURE January 2022   Tubal  pregnancy     Patient Active Problem List   Diagnosis Date Noted   Iron deficiency anemia 05/13/2022   Menometrorrhagia 05/13/2022   History of cesarean section 03/27/2021   Diabetes mellitus affecting pregnancy, unspecified trimester 09/19/2020   Hidradenitis suppurativa 05/24/2018   Bipolar disorder (HCC) 10/01/2017   History of cesarean delivery 07/12/2017   Family history of Huntington's disease    Asthma affecting pregnancy, antepartum 05/10/2017   Smoking (tobacco) complicating pregnancy, unspecified trimester 05/10/2017   Seizure disorder during pregnancy, third trimester (HCC) 05/10/2017   History of anemia 04/19/2017   History of maternal blood transfusion, currently pregnant 04/19/2017   ADD (attention deficit disorder) 07/17/2015   Post traumatic stress disorder (PTSD) 06/16/2013   Depression 06/15/2013   Suicidal ideation 06/15/2013   Status epilepticus (HCC) 06/14/2013   Diabetes mellitus type 2 in obese (HCC) 06/14/2013     Physical Exam  Triage Vital Signs: ED Triage Vitals  Enc Vitals Group     BP 05/18/22 1708 125/74     Pulse Rate 05/18/22 1708 65     Resp 05/18/22 1708 18     Temp 05/18/22 1708 98.5 F (36.9 C)     Temp Source 05/18/22 1708 Oral     SpO2 05/18/22 1708 98 %     Weight 05/18/22 1709 198 lb 6.6 oz (90  kg)     Height 05/18/22 1709 5\' 6"  (1.676 m)     Head Circumference --      Peak Flow --      Pain Score 05/18/22 1709 0     Pain Loc --      Pain Edu? --      Excl. in GC? --     Most recent vital signs: Vitals:   05/18/22 1708  BP: 125/74  Pulse: 65  Resp: 18  Temp: 98.5 F (36.9 C)  SpO2: 98%     General: Awake, no distress.  CV:  Good peripheral perfusion.  Resp:  Normal effort.  Abd:  No distention.  Neuro:             Awake, Alert, Oriented x 3  Other:  Aox3, nml speech  PERRL, EOMI, face symmetric, nml tongue movement  5/5 strength in the BL upper and lower extremities  Sensation grossly intact in the BL upper  and lower extremities  Finger-nose-finger intact BL    ED Results / Procedures / Treatments  Labs (all labs ordered are listed, but only abnormal results are displayed) Labs Reviewed  CBC WITH DIFFERENTIAL/PLATELET - Abnormal; Notable for the following components:      Result Value   WBC 12.6 (*)    Hemoglobin 8.5 (*)    HCT 31.1 (*)    MCV 65.1 (*)    MCH 17.8 (*)    MCHC 27.3 (*)    RDW 18.7 (*)    Lymphs Abs 4.2 (*)    All other components within normal limits  BASIC METABOLIC PANEL - Abnormal; Notable for the following components:   CO2 21 (*)    Calcium 8.6 (*)    All other components within normal limits  CBG MONITORING, ED     EKG   EKG interpretation performed by myself: NSR, nml axis, nml intervals, no acute ischemic changes   RADIOLOGY    PROCEDURES:  Critical Care performed: No  .1-3 Lead EKG Interpretation  Performed by: 05/20/22, MD Authorized by: Georga Hacking, MD     Interpretation: normal     ECG rate assessment: normal     Rhythm: sinus rhythm     Ectopy: none     Conduction: normal     The patient is on the cardiac monitor to evaluate for evidence of arrhythmia and/or significant heart rate changes.   MEDICATIONS ORDERED IN ED: Medications  lacosamide (VIMPAT) tablet 200 mg (200 mg Oral Given 05/18/22 1814)     IMPRESSION / MDM / ASSESSMENT AND PLAN / ED COURSE  I reviewed the triage vital signs and the nursing notes.                              Patient's presentation is most consistent with exacerbation of chronic illness.  Differential diagnosis includes, but is not limited to, seizure, pseudoseizure, syncope, drug reaction  The patient is a 31 year old female with history of seizure disorder on Vimpat presents today for possible seizure-like activity while getting an iron infusion.  She was in the cancer center when she was getting iron infusions suddenly described feeling flushed and having chest tightness  then was noted to have her eyes closed and asked significantly altered.  Patient's partner who is in the room with her says that she had shaking in her bilateral lower extremities.  Says this looks similar to prior seizures but  sometimes involve whole body shaking sometimes do not.  He says she has a seizure about every night that typically occur in the evening or when she goes outside into the heat.  Per last neurology note there is some suspicion that patient may have pseudoseizures versus true seizures.  She is prescribed Vimpat 150 mg twice daily.  She has not been taking this for the last 4 weeks because she has been out of it and does not have any current neurology follow-up.  She denies any new neuro logic symptoms she denies any recent illnesses pregnancy test was negative earlier today.  Patient's vitals are within normal limits her neurologic exam is nonfocal.  Patient was treated for potential allergic reaction my suspicion for drug reaction is less think that this was likely seizure versus pseudoseizure.  Given patient has a history of similar with nonfocal exam I do not think there is indication for head imaging today.  Reviewed Vimpat dosing on up-to-date.  For initiation give 200 mg loading dose and then can start 100 mg twice daily with up titration to 50 mg twice daily weekly.  Encouraged neurology follow-up.       FINAL CLINICAL IMPRESSION(S) / ED DIAGNOSES   Final diagnoses:  Seizure-like activity (HCC)     Rx / DC Orders   ED Discharge Orders          Ordered    Lacosamide (VIMPAT) 100 MG TABS  2 times daily        05/18/22 1858             Note:  This document was prepared using Dragon voice recognition software and may include unintentional dictation errors.   Georga Hacking, MD 05/18/22 [phone]

## 2022-05-18 NOTE — Progress Notes (Signed)
At 1631, patient began complaining of chest pain and being flushed. Patient appeared very drowsy and it was hard to get patient to respond. Strabismus noted. Feraheme stopped immediately and NS bolus started. Vital signs stable. No signs of shortness or breath.  Lauren NP, called over to assess the patient.  At 1632, 125 mg of Solumedrol was given. Vital signs stable. Patient then given Benadryl, Pepcid IV. (See eMAR) Patient has a history of seizures and appeared to be in post ictal phase. EMS called to transport patient to the ED.  Significant other at chairside. Patient now more alert and responding. Vital signs remains stable. EMS arrived to take patient to the ED. PIV remains in place with IV NS bolus infusing. Report given to EMS.

## 2022-05-18 NOTE — Discharge Instructions (Addendum)
You likely had a seizure today.  I have prescribed your Vimpat.  For the first 7 days please start taking 1 pill or 100 mg twice a day.  After that you can increase to 1.5 pills or 150 mg twice a day.

## 2022-05-18 NOTE — Patient Instructions (Signed)

## 2022-05-18 NOTE — ED Triage Notes (Signed)
Pt at cancer center getting iron infusion , per ems absent seizures, , pt received solumedrol 125, benadryl 25 and pepcid , pt out of vimpat x 30 days. Pt axox4

## 2022-05-20 ENCOUNTER — Telehealth: Payer: Self-pay | Admitting: Internal Medicine

## 2022-05-20 NOTE — Telephone Encounter (Signed)
I was informed about the probable seizure activity on 8/14 when the patient was getting iron infusion. The symptoms started towards the end of infusion. I reviewed Consuello Masse, NP and ED provider note. She was transferred to ED and was loaded with Vimpat. She was discharged the same evening on vimpat.   My suspicion for drug reaction is very low. She has received IV iron infusions in the past without any complications. Patient has long standing history of seizure disorder. I spoke with the patient over the phone. She reports she ran out of her seizure medication more than a month ago. At home, she was having frequent episodes of seizure. She tells me she is expecting a call from neurology at [facility] clinic on 8/21 to schedule f/u. She denies any further seizure activity since ED discharge. Admits taking her medications. Reports she feels well enough to come for iron infusion on Monday.

## 2022-05-25 ENCOUNTER — Telehealth: Payer: Self-pay

## 2022-05-25 ENCOUNTER — Inpatient Hospital Stay: Payer: Medicaid Other

## 2022-05-25 MED FILL — Ferumoxytol Inj 510 MG/17ML (30 MG/ML) (Elemental Fe): INTRAVENOUS | Qty: 17 | Status: AC

## 2022-05-25 NOTE — Telephone Encounter (Signed)
Patient walked into office to inquire if prescription for Clomid would be sent to her pharmacy? Patient was last seen in office on 03/20/22 for pre-conception counseling, in notes it states "She used Clomid to achieve pregnancy with her fist child, her next 2 children were conceived spontaneously." According to Assessment and Plan it reads as  below  "1) Discussed that she had her third LTCS less than a year ago, for the safety of her next pregnancy, it is not advised to become pregnant so soon. For a successful healthy pregnancy she needs to to the following: 1)start daily PNV and Folic Acid supplement 2) Decrease smoking with the plan to completely quit 3) Obtain a more healthy weight (at least 10% weight loss) 4) Manage her other health conditions of DM and Depression-pt under treatment for both.  Although her partner has other children, he may need an evaluation as well.    2) Discussed using  OPK daily and keeping a running log of the test strips and symptoms.      3) Return in about 3 months (around 06/20/2022) for Dr Logan Bores.     4) reviewed a corpus luteum cysts was found on her [place] during her last pregnancy, there is no need follow up. "  Please advise if this is a medication you would like sent in for patient? If so patient is requesting that it be seen to [facility] on [address]. KW

## 2022-05-27 ENCOUNTER — Inpatient Hospital Stay: Payer: Medicaid Other

## 2022-05-27 MED FILL — Ferumoxytol Inj 510 MG/17ML (30 MG/ML) (Elemental Fe): INTRAVENOUS | Qty: 17 | Status: AC

## 2022-05-27 NOTE — Telephone Encounter (Signed)
Patient has been advised and states that she was not aware of this from last visit, she request an appointment as soon as possible to discuss. Appointment has been scheduled. Faith [name]

## 2022-05-28 ENCOUNTER — Inpatient Hospital Stay: Payer: Medicaid Other

## 2022-06-01 ENCOUNTER — Other Ambulatory Visit: Payer: Self-pay | Admitting: Student

## 2022-06-01 DIAGNOSIS — R569 Unspecified convulsions: Secondary | ICD-10-CM

## 2022-06-01 DIAGNOSIS — E569 Vitamin deficiency, unspecified: Secondary | ICD-10-CM

## 2022-06-05 ENCOUNTER — Telehealth: Payer: Self-pay | Admitting: Internal Medicine

## 2022-06-05 ENCOUNTER — Inpatient Hospital Stay: Payer: Medicaid Other | Attending: Internal Medicine

## 2022-06-05 VITALS — BP 97/60 | HR 69 | Temp 97.0°F | Resp 20

## 2022-06-05 DIAGNOSIS — D509 Iron deficiency anemia, unspecified: Secondary | ICD-10-CM | POA: Insufficient documentation

## 2022-06-05 DIAGNOSIS — D500 Iron deficiency anemia secondary to blood loss (chronic): Secondary | ICD-10-CM

## 2022-06-05 DIAGNOSIS — R079 Chest pain, unspecified: Secondary | ICD-10-CM | POA: Diagnosis not present

## 2022-06-05 MED ORDER — SODIUM CHLORIDE 0.9 % IV SOLN
Freq: Once | INTRAVENOUS | Status: AC
  Filled 2022-06-05: qty 250

## 2022-06-05 MED ORDER — SODIUM CHLORIDE 0.9 % IV SOLN
Freq: Once | INTRAVENOUS | Status: DC | PRN
  Filled 2022-06-05: qty 250

## 2022-06-05 MED ORDER — SODIUM CHLORIDE 0.9 % IV SOLN
510.0000 mg | Freq: Once | INTRAVENOUS | Status: AC
  Administered 2022-06-05: 510 mg via INTRAVENOUS
  Filled 2022-06-05: qty 510

## 2022-06-05 NOTE — Progress Notes (Signed)
1319- Feraheme started. 1324-pt complaining of chest pain 6/10, feeling hot. Feraheme stopped, IVF started. Dr Alena Bills notified and chairside at 1345. Heart rate and O2 WNL, BP low at 85/53 EKG performed. Per Dr Alena Bills, give 1/2 L of NS over 30 minutes and restart feraheme. Will continue to monitor.  1324-Pt complaining of IV site hurting. IV removed. Spoke with MD in regards to not continuing feraheme as pt stated having chest pain at the start of last feraheme infusion which lead into pt having a seizure and having to be transported to ED via EMS. Per MD, have pt reschedule and will switch to venofer. Pt verbalized understanding. BP stable at discharge

## 2022-06-05 NOTE — Telephone Encounter (Signed)
Patient presented to infusion today for her second round of Feraheme.  Towards the end of first infusion patient has some chest pain followed by seizure-like activity.  She was transferred to ED. patient had not been taking antiseizure medication for few weeks since she ran out of it.  Suspicion of drug reaction was low.  She presented today for second infusion of Feraheme.  5 minutes into the infusion, patient reported that she started feeling hot and had symptoms of chest pain, shortness of breath which lasted few seconds before it completely resolved.  When I went to see the patient, she was alert oriented x3.  Her symptom had resolved by then.  EKG was done which showed normal sinus rhythm.  Vitals showed normal heart rate and pulse oxygenation.  She did have low blood pressure. possible she may have some vasovagal reaction.  Leg elevation was done.  500 cc infused.  There was improvement in blood pressure.  Decided to hold off on further Feraheme infusions.  We will reschedule the patient and transition to Venofer x2 doses weekly for now.

## 2022-06-09 ENCOUNTER — Encounter: Payer: Self-pay | Admitting: Internal Medicine

## 2022-06-11 MED FILL — Iron Sucrose Inj 20 MG/ML (Fe Equiv): INTRAVENOUS | Qty: 10 | Status: AC

## 2022-06-12 ENCOUNTER — Inpatient Hospital Stay: Payer: Medicaid Other

## 2022-06-12 VITALS — BP 96/57 | HR 69 | Temp 97.4°F | Resp 18

## 2022-06-12 DIAGNOSIS — N921 Excessive and frequent menstruation with irregular cycle: Secondary | ICD-10-CM

## 2022-06-12 DIAGNOSIS — D500 Iron deficiency anemia secondary to blood loss (chronic): Secondary | ICD-10-CM

## 2022-06-12 DIAGNOSIS — D509 Iron deficiency anemia, unspecified: Secondary | ICD-10-CM | POA: Diagnosis not present

## 2022-06-12 LAB — PREGNANCY, URINE: Preg Test, Ur: NEGATIVE

## 2022-06-12 MED ORDER — SODIUM CHLORIDE 0.9 % IV SOLN
200.0000 mg | Freq: Once | INTRAVENOUS | Status: AC
  Administered 2022-06-12: 200 mg via INTRAVENOUS
  Filled 2022-06-12: qty 200

## 2022-06-12 MED ORDER — SODIUM CHLORIDE 0.9 % IV SOLN
INTRAVENOUS | Status: DC | PRN
  Filled 2022-06-12: qty 250

## 2022-06-12 NOTE — Progress Notes (Signed)
1st time venofer given without incident. Patient monitored for 30 minutes post infusion. Patient tolerated well. Discharged ambulatory in stable condition.

## 2022-06-19 ENCOUNTER — Inpatient Hospital Stay: Payer: Medicaid Other

## 2022-06-19 VITALS — BP 115/65 | HR 81 | Temp 98.3°F | Resp 18

## 2022-06-19 DIAGNOSIS — D500 Iron deficiency anemia secondary to blood loss (chronic): Secondary | ICD-10-CM

## 2022-06-19 DIAGNOSIS — D509 Iron deficiency anemia, unspecified: Secondary | ICD-10-CM | POA: Diagnosis not present

## 2022-06-19 MED ORDER — SODIUM CHLORIDE 0.9 % IV SOLN
200.0000 mg | Freq: Once | INTRAVENOUS | Status: AC
  Administered 2022-06-19: 200 mg via INTRAVENOUS
  Filled 2022-06-19: qty 200

## 2022-06-19 MED ORDER — SODIUM CHLORIDE 0.9 % IV SOLN
Freq: Once | INTRAVENOUS | Status: AC
  Filled 2022-06-19: qty 250

## 2022-06-22 ENCOUNTER — Inpatient Hospital Stay: Payer: Medicaid Other

## 2022-06-22 DIAGNOSIS — D500 Iron deficiency anemia secondary to blood loss (chronic): Secondary | ICD-10-CM

## 2022-06-22 DIAGNOSIS — D509 Iron deficiency anemia, unspecified: Secondary | ICD-10-CM | POA: Diagnosis not present

## 2022-06-22 LAB — CBC WITH DIFFERENTIAL/PLATELET
Abs Immature Granulocytes: 0.06 10*3/uL (ref 0.00–0.07)
Basophils Absolute: 0.1 10*3/uL (ref 0.0–0.1)
Basophils Relative: 1 %
Eosinophils Absolute: 0.5 10*3/uL (ref 0.0–0.5)
Eosinophils Relative: 5 %
HCT: 41 % (ref 36.0–46.0)
Hemoglobin: 12 g/dL (ref 12.0–15.0)
Immature Granulocytes: 1 %
Lymphocytes Relative: 28 %
Lymphs Abs: 2.9 10*3/uL (ref 0.7–4.0)
MCH: 21.4 pg — ABNORMAL LOW (ref 26.0–34.0)
MCHC: 29.3 g/dL — ABNORMAL LOW (ref 30.0–36.0)
MCV: 73.2 fL — ABNORMAL LOW (ref 80.0–100.0)
Monocytes Absolute: 0.5 10*3/uL (ref 0.1–1.0)
Monocytes Relative: 5 %
Neutro Abs: 6.4 10*3/uL (ref 1.7–7.7)
Neutrophils Relative %: 60 %
Platelets: 360 10*3/uL (ref 150–400)
RBC: 5.6 MIL/uL — ABNORMAL HIGH (ref 3.87–5.11)
RDW: 26.3 % — ABNORMAL HIGH (ref 11.5–15.5)
WBC: 10.4 10*3/uL (ref 4.0–10.5)
nRBC: 0 % (ref 0.0–0.2)

## 2022-06-22 LAB — FERRITIN: Ferritin: 72 ng/mL (ref 11–307)

## 2022-06-22 LAB — IRON AND TIBC
Iron: 46 ug/dL (ref 28–170)
Saturation Ratios: 12 % (ref 10.4–31.8)
TIBC: 370 ug/dL (ref 250–450)
UIBC: 324 ug/dL

## 2022-06-23 MED FILL — Iron Sucrose Inj 20 MG/ML (Fe Equiv): INTRAVENOUS | Qty: 10 | Status: AC

## 2022-06-24 ENCOUNTER — Encounter: Payer: Self-pay | Admitting: Internal Medicine

## 2022-06-24 ENCOUNTER — Inpatient Hospital Stay (HOSPITAL_BASED_OUTPATIENT_CLINIC_OR_DEPARTMENT_OTHER): Payer: Medicaid Other | Admitting: Internal Medicine

## 2022-06-24 DIAGNOSIS — N921 Excessive and frequent menstruation with irregular cycle: Secondary | ICD-10-CM

## 2022-06-24 DIAGNOSIS — D500 Iron deficiency anemia secondary to blood loss (chronic): Secondary | ICD-10-CM | POA: Diagnosis not present

## 2022-06-24 DIAGNOSIS — E538 Deficiency of other specified B group vitamins: Secondary | ICD-10-CM | POA: Diagnosis not present

## 2022-06-24 HISTORY — DX: Deficiency of other specified B group vitamins: E53.8

## 2022-06-24 NOTE — Progress Notes (Signed)
[place]  Telephone:([phone] [phone]  HEMATOLOGY-ONCOLOGY TELEMEDICINE VISIT PROGRESS NOTE  ID: Faith [name] OB: [birth year]  MR#: [id number]  JIR#:[id number]  Patient Care Team: [name], MD as PCP - General (Family Medicine) Jane Canary, MD as Consulting Physician (Oncology)  I connected withNAME@ on 06/24/22 at  9:00 AM EDT by @[email or web address] @ and verified that I am speaking with the correct person using two identifiers.   I discussed the limitations, risks, security and privacy concerns of performing an evaluation and management service by telemedicine and the availability of in-person appointments. I also discussed with the patient that there may be a patient responsible charge related to this service. The patient expressed understanding and agreed to proceed.  Other persons participating in the visit and their role in the encounter:  none  PATIENT'S LOCATION:  home PROVIDER'S LOCATION:  office  CHIEF COMPLAINT:  Faith [name] is a 31 y.o. female with past medical history as below Patient reported lack of energy and increased sleeping.  Patient has history of iron deficiency anemia for long time now.  She reports metromenorrhagia with heavy menstrual bleeding at times requiring her to change pads or tampons every couple of hours.  Also associated with blood clots.  She has tried oral contraceptive and progesterone Depo in the past but had swelling.  She has 3 children with last pregnancy 1 year ago.  She is again trying to get pregnant.  She is followed by OB GYN for menstrual bleeding. She reports rare bleeding in stool when constipated. Denies gum bleeding, nosebleeding or bleeding in urine.  Denies gastric bypass surgery. She has tried oral iron but with no improvement in iron stores.  After first infusion of Feraheme, towards the end patient had a seizure and was sent to ER.  However this was thought to be related to her  being out of seizure medication for several weeks.  During second infusion of Feraheme, she had an episode of chest pain and hypotension.  EKG showed NSR.  Infusion was discontinued.  She was switched to Venofer and was given 2 doses which she tolerated well.  INTERVAL HISTORY:  I had a telephonic visit with the patient today.  She reports low energy in the morning however she feels better as the day goes by.  She continues to have irregular menstrual period with episodes of heavy periods when it happens.  Otherwise denies any bleeding in stool urine gum or nose.  REVIEW OF SYSTEMS:   ROS  As per HPI. Otherwise, a complete review of systems is negative.  PAST MEDICAL HISTORY: Past Medical History:  Diagnosis Date   ADHD    Anemia    BLOOD TRANSFUSION AFTER C-SECTION ON 06-2016-2 UNITS   Anxiety    Asthma    WELL CONTROLLED   Bipolar 1 disorder, depressed ([place])    COVID-19    Depression    Diabetes mellitus without complication (HCC)    Gallstones    GERD (gastroesophageal reflux disease)    Heart murmur    History of blood transfusion 06/2016   RECEIVED 2 UNITS OF BLOOD AFTER C SECTION   Migraines    Ovarian cyst    Seizures ([place])    LAST SEIZURE January 2022   Tubal pregnancy     PAST SURGICAL HISTORY: Past Surgical History:  Procedure Laterality Date   CESAREAN SECTION N/A 06/22/2016   Procedure: CESAREAN SECTION;  Surgeon: Malachy Mood, MD;  Location: [facility] ORS;  Service: Obstetrics;  Laterality: N/A;   CESAREAN SECTION N/A 10/12/2017   Procedure: CESAREAN SECTION;  Surgeon: Malachy Mood, MD;  Location: [facility] OR[facility];  Service: Obstetrics;  Laterality: N/A;   CESAREAN SECTION N/A 03/27/2021   Procedure: CESAREAN SECTION;  Surgeon: Homero Fellers, MD;  Location: [facility] OR[facility];  Service: Obstetrics;  Laterality: N/A;   CHOLECYSTECTOMY N/A 08/13/2016   Procedure: LAPAROSCOPIC CHOLECYSTECTOMY;  Surgeon: Jules Husbands, MD;  Location: [facility] ORS;  Service: General;   Laterality: N/A;   DILATION AND CURETTAGE OF UTERUS     ovarian cyst removed     SALPINGECTOMY Right 2013   ectopic pregnancy. PER PATIENT, STILL HAS BOTH TUBES   TONSILLECTOMY     TONSILLECTOMY AND ADENOIDECTOMY      FAMILY HISTORY: Family History  Problem Relation Age of Onset   Heart disease Father    Huntington's disease Mother     ADVANCED DIRECTIVES (Y/N):  N  Allergies  Allergen Reactions   Ivp Dye [Iodinated Contrast Media] Anaphylaxis    Per patient, she does not have problems with betadine   Latex Rash   Metrizamide Anaphylaxis   Morphine Other (See Comments)    bradycradia    Mushroom Extract Complex Anaphylaxis    Hives then swelling of throat and can not breath   Sulfasalazine Hives   Tomato Anaphylaxis   Toradol [Ketorolac Tromethamine] Anaphylaxis, Hives and Swelling   Aspirin Hives   Dilaudid [Hydromorphone Hcl] Hives   Lamictal [Lamotrigine] Hives   Sulfa Antibiotics Hives   Tramadol Swelling, Rash and Hives   Adhesive [Tape] Itching    USE PAPER TAPE   Iohexol Rash    (contrast dye) causes red bumps    Current Outpatient Medications  Medication Sig Dispense Refill   Accu-Chek Softclix Lancets lancets 1 each by Other route 4 (four) times daily. 100 each 12   acetaminophen (TYLENOL) 500 MG tablet Take 1,000 mg by mouth every 6 (six) hours as needed (for pain).     alprazolam (XANAX) 2 MG tablet Take by mouth.     amphetamine-dextroamphetamine (ADDERALL) 15 MG tablet Take 1 tablet by mouth 2 (two) times daily.     Blood Glucose Monitoring Suppl (ACCU-CHEK NANO SMARTVIEW) w/Device KIT 1 kit by Subdermal route as directed. Check blood sugars for fasting, and two hours after breakfast, [name] and dinner (4 checks daily) 1 kit 0   DULoxetine (CYMBALTA) 30 MG capsule Take 30 mg by mouth daily.     glucose blood (ACCU-CHEK SMARTVIEW) test strip Use as instructed to check blood sugars 100 each 12   hydrocortisone (ANUSOL-HC) 25 MG suppository Place 1  suppository (25 mg total) rectally 2 (two) times daily. (Patient not taking: Reported on 05/13/2022) 12 suppository 1   ibuprofen (ADVIL) 600 MG tablet Take 600 mg by mouth every 6 (six) hours as needed.     Lacosamide (VIMPAT) 100 MG TABS Take 1 tablet (100 mg total) by mouth in the morning and at bedtime. Take 100 mg twice daily for the first week.  You can then increase to 150 mg which would be 1.5 pills twice a day. 80 tablet 0   Lacosamide (VIMPAT) 150 MG TABS TAKE 1 TABLET BY MOUTH TWICE DAILY FOR SEIZURE DISORDER     nystatin (MYCOSTATIN/NYSTOP) powder Apply 1 application topically 3 (three) times daily. (Patient not taking: Reported on 03/20/2022) 15 g 0   PARoxetine (PAXIL) 20 MG tablet SMARTSIG:1 Tablet(s) By Mouth Morning-Evening     Prenatal Vit-Fe Fumarate-FA (WESTAB PLUS) 27-1 MG TABS  Take by mouth.     traZODone (DESYREL) 50 MG tablet TAKE 1 TABLET BY MOUTH EVERY NIGHT AT BEDTIME AS NEEDED FOR INSOMNIA. INCREASE TO 2 TABLET AS NEEDED AFTER 4-5 DAYS     No current facility-administered medications for this visit.    OBJECTIVE: There were no vitals filed for this visit.   There is no height or weight on file to calculate BMI.    ECOG FS:1 - Symptomatic but completely ambulatory  Physical exam not performed due to telemedicine visit.   LAB RESULTS:  Lab Results  Component Value Date   NA 136 05/18/2022   K 4.8 05/18/2022   CL 107 05/18/2022   CO2 21 (L) 05/18/2022   GLUCOSE 92 05/18/2022   BUN 13 05/18/2022   CREATININE 0.64 05/18/2022   CALCIUM 8.6 (L) 05/18/2022   PROT 7.5 02/01/2022   ALBUMIN 4.2 02/01/2022   AST 11 (L) 02/01/2022   ALT 11 02/01/2022   ALKPHOS 57 02/01/2022   BILITOT 0.5 02/01/2022   GFRNONAA >60 05/18/2022   GFRAA >60 06/05/2020    Lab Results  Component Value Date   WBC 10.4 06/22/2022   NEUTROABS 6.4 06/22/2022   HGB 12.0 06/22/2022   HCT 41.0 06/22/2022   MCV 73.2 (L) 06/22/2022   PLT 360 06/22/2022      STUDIES: No results  found.  ASSESSMENT:  Faith [name] is a 31 y.o. female was referred to hematology clinic for management of iron deficiency anemia.   Iron deficiency anemia - Likely source metro-menorrhagia. -Labs from 05/18/2022 showed hemoglobin 8.5 and ferritin of 2. -Received 1 dose of Feraheme and 2 doses of Venofer.  Could not tolerate further Feraheme due to episode of chest pain and hypotension. -Repeat labs showed improvement in hemoglobin and iron stores.  Hemoglobin 12 and ferritin of 72. -We will follow-up in 4 months with repeat labs.  Menometrorrhagia - following with OBGYN  - discussed likely cause of IDA is heavy menstrual periods associated with blood clots.   Vitamin b12 deficiency -B12 216 on 05/21/2022 checked by her neurologist. -Advised to take OTC cyanocobalamin 1000 mcg daily.  Orders Placed This Encounter  Procedures   Iron and TIBC([facility])   Ferritin   CBC with Differential   Vitamin B12   RTC in 4 months for MD visit, labs prior and possible Venofer.  PLAN:    I discussed the assessment and treatment plan with the patient. The patient was provided an opportunity to ask questions and all were answered. The patient agreed with the plan and demonstrated an understanding of the instructions.   The patient was advised to call back or seek an in-person evaluation if the symptoms worsen or if the condition fails to improve as anticipated.   Jane Canary, MD 06/24/2022 9:16 AM

## 2022-07-14 ENCOUNTER — Encounter: Payer: Medicaid Other | Admitting: Obstetrics and Gynecology

## 2022-07-14 DIAGNOSIS — Z3169 Encounter for other general counseling and advice on procreation: Secondary | ICD-10-CM

## 2022-07-29 ENCOUNTER — Encounter: Payer: Self-pay | Admitting: Obstetrics and Gynecology

## 2022-07-29 ENCOUNTER — Telehealth: Payer: Self-pay

## 2022-07-29 ENCOUNTER — Ambulatory Visit (INDEPENDENT_AMBULATORY_CARE_PROVIDER_SITE_OTHER): Payer: Medicaid Other | Admitting: Obstetrics and Gynecology

## 2022-07-29 VITALS — BP 99/66 | HR 69 | Ht 66.0 in | Wt 202.8 lb

## 2022-07-29 DIAGNOSIS — E282 Polycystic ovarian syndrome: Secondary | ICD-10-CM | POA: Diagnosis not present

## 2022-07-29 DIAGNOSIS — Z3169 Encounter for other general counseling and advice on procreation: Secondary | ICD-10-CM

## 2022-07-29 MED ORDER — LETROZOLE 2.5 MG PO TABS: 2.5000 mg | ORAL_TABLET | Freq: Every day | ORAL | 0 refills | Status: DC

## 2022-07-29 NOTE — Progress Notes (Signed)
HPI:      Faith [name] is a 31 y.o. [id number] who LMP was Patient's last menstrual period was 07/27/2022 (exact date).  Subjective:   She presents today for infertility.  Patient has a history of PCO and conceived previously using Clomid.  She has recently undergone a work-up at [facility] where she states that she is ovulating each month and her partners semen analysis is normal.  She was told that she should start Clomid again to conceive.  She is specifically requesting a prescription for ovulation induction today.    Hx: The following portions of the patient's history were reviewed and updated as appropriate:             She  has a past medical history of ADHD, Anemia, Anxiety, Asthma, Bipolar 1 disorder, depressed ([place]), COVID-19, Depression, Diabetes mellitus without complication ([place]), Gallstones, GERD (gastroesophageal reflux disease), Heart murmur, History of blood transfusion (06/2016), Migraines, Ovarian cyst, Seizures ([place]), and Tubal pregnancy. She does not have any pertinent problems on file. She  has a past surgical history that includes Dilation and curettage of uterus; ovarian cyst removed; Tonsillectomy; Tonsillectomy and adenoidectomy; Salpingectomy (Right, 2013); Cholecystectomy (N/A, 08/13/2016); Cesarean section (N/A, 06/22/2016); Cesarean section (N/A, 10/12/2017); and Cesarean section (N/A, 03/27/2021). Her family history includes Heart disease in her father; Huntington's disease in her mother. She  reports that she has been smoking cigarettes. She has a 3.50 pack-year smoking history. She has never used smokeless tobacco. She reports that she does not currently use alcohol. She reports that she does not use drugs. She has a current medication list which includes the following prescription(s): accu-chek softclix lancets, acetaminophen, alprazolam, amphetamine-dextroamphetamine, duloxetine, hydrocortisone, ibuprofen, lacosamide, letrozole, paroxetine, westab plus, trazodone, and  lacosamide. She is allergic to ivp dye [iodinated contrast media], latex, metrizamide, morphine, mushroom extract complex, sulfasalazine, tomato, toradol [ketorolac tromethamine], aspirin, dilaudid [hydromorphone hcl], lamictal [lamotrigine], sulfa antibiotics, tramadol, adhesive [tape], and iohexol.       Review of Systems:  Review of Systems  Constitutional: Denied constitutional symptoms, night sweats, recent illness, fatigue, fever, insomnia and weight loss.  Eyes: Denied eye symptoms, eye pain, photophobia, vision change and visual disturbance.  Ears/Nose/Throat/Neck: Denied ear, nose, throat or neck symptoms, hearing loss, nasal discharge, sinus congestion and sore throat.  Cardiovascular: Denied cardiovascular symptoms, arrhythmia, chest pain/pressure, edema, exercise intolerance, orthopnea and palpitations.  Respiratory: Denied pulmonary symptoms, asthma, pleuritic pain, productive sputum, cough, dyspnea and wheezing.  Gastrointestinal: Denied, gastro-esophageal reflux, melena, nausea and vomiting.  Genitourinary: Denied genitourinary symptoms including symptomatic vaginal discharge, pelvic relaxation issues, and urinary complaints.  Musculoskeletal: Denied musculoskeletal symptoms, stiffness, swelling, muscle weakness and myalgia.  Dermatologic: Denied dermatology symptoms, rash and scar.  Neurologic: Denied neurology symptoms, dizziness, headache, neck pain and syncope.  Psychiatric: Denied psychiatric symptoms, anxiety and depression.  Endocrine: Denied endocrine symptoms including hot flashes and night sweats.   Meds:   Current Outpatient Medications on File Prior to Visit  Medication Sig Dispense Refill   Accu-Chek Softclix Lancets lancets 1 each by Other route 4 (four) times daily. 100 each 12   acetaminophen (TYLENOL) 500 MG tablet Take 1,000 mg by mouth every 6 (six) hours as needed (for pain).     alprazolam (XANAX) 2 MG tablet Take by mouth.      amphetamine-dextroamphetamine (ADDERALL) 15 MG tablet Take 1 tablet by mouth 2 (two) times daily.     DULoxetine (CYMBALTA) 30 MG capsule Take 30 mg by mouth daily.     hydrocortisone (ANUSOL-HC)  25 MG suppository Place 1 suppository (25 mg total) rectally 2 (two) times daily. 12 suppository 1   ibuprofen (ADVIL) 600 MG tablet Take 600 mg by mouth every 6 (six) hours as needed.     Lacosamide (VIMPAT) 150 MG TABS TAKE 1 TABLET BY MOUTH TWICE DAILY FOR SEIZURE DISORDER     PARoxetine (PAXIL) 20 MG tablet SMARTSIG:1 Tablet(s) By Mouth Morning-Evening     Prenatal Vit-Fe Fumarate-FA (WESTAB PLUS) 27-1 MG TABS Take by mouth.     traZODone (DESYREL) 50 MG tablet TAKE 1 TABLET BY MOUTH EVERY NIGHT AT BEDTIME AS NEEDED FOR INSOMNIA. INCREASE TO 2 TABLET AS NEEDED AFTER 4-5 DAYS     Lacosamide (VIMPAT) 100 MG TABS Take 1 tablet (100 mg total) by mouth in the morning and at bedtime. Take 100 mg twice daily for the first week.  You can then increase to 150 mg which would be 1.5 pills twice a day. 80 tablet 0   No current facility-administered medications on file prior to visit.      Objective:     Vitals:   07/29/22 1000  BP: 99/66  Pulse: 69   Filed Weights   07/29/22 1000  Weight: 202 lb 12.8 oz (92 kg)                        Assessment:    [id number] Patient Active Problem List   Diagnosis Date Noted   B12 deficiency 06/24/2022   Iron deficiency anemia 05/13/2022   Menometrorrhagia 05/13/2022   History of cesarean section 03/27/2021   Diabetes mellitus affecting pregnancy, unspecified trimester 09/19/2020   Hidradenitis suppurativa 05/24/2018   Bipolar disorder ([place]) 10/01/2017   History of cesarean delivery 07/12/2017   Family history of Huntington's disease    Asthma affecting pregnancy, antepartum 05/10/2017   Smoking (tobacco) complicating pregnancy, unspecified trimester 05/10/2017   Seizure disorder during pregnancy, third trimester ([place]) 05/10/2017   History of anemia  04/19/2017   History of maternal blood transfusion, currently pregnant 04/19/2017   ADD (attention deficit disorder) 07/17/2015   Post traumatic stress disorder (PTSD) 06/16/2013   Depression 06/15/2013   Suicidal ideation 06/15/2013   Status epilepticus ([place]) 06/14/2013   Diabetes mellitus type 2 in obese ([place]) 06/14/2013     1. Pre-conception counseling   2. PCO (polycystic ovaries)     Patient with a known history of PCO previously conceived on Clomid.  She has also had multiple cesarean deliveries.  She has a new partner and would like to conceive again now.   Plan:            1.  Patient declined use of OCPs for hormone regulation and cycle regulation.  She is insistent upon beginning ovulation induction at this time.  We will begin letrozole.  Days 3 through 7.  Patient to use ovulation predictor kit as directed and to have appropriately timed intercourse.  Use of prenatal vitamins discussed.  If patient anovulatory would consider increase of dose.  If she then remains anovulatory would require use of OCPs prior to ovulation induction.  All questions answered. Orders No orders of the defined types were placed in this encounter.    Meds ordered this encounter  Medications   letrozole (FEMARA) 2.5 MG tablet    Sig: Take 1 tablet (2.5 mg total) by mouth daily. On cycle days 3 through day 7    Dispense:  10 tablet    Refill:  0  F/U  Return in about 3 months (around 10/29/2022). I spent 25 minutes involved in the care of this patient preparing to see the patient by obtaining and reviewing her medical history (including labs, imaging tests and prior procedures), documenting clinical information in the electronic health record (EHR), counseling and coordinating care plans, writing and sending prescriptions, ordering tests or procedures and in direct communicating with the patient and medical staff discussing pertinent items from her history and physical exam.  Finis Bud,  M.D. 07/29/2022 10:31 AM

## 2022-07-29 NOTE — Progress Notes (Signed)
Patient presents today to discuss Clomid use and attempting to become pregnant. She irregular cycles, with no current use of birth control. No additional concerns at this time.

## 2022-07-29 NOTE — Telephone Encounter (Signed)
TR[facility] VOICEMAIL: Patient reports she had a visit with Dr. Amalia Hailey this morning. A rx for Femara was sent. She was expecting Clomid. She would like a return call to discuss. [phone].

## 2022-08-03 NOTE — Telephone Encounter (Signed)
Advised via mychart

## 2022-09-01 ENCOUNTER — Ambulatory Visit
Admission: RE | Admit: 2022-09-01 | Discharge: 2022-09-01 | Disposition: A | Discharge status: Home / Self Care | Payer: Medicaid Other | Source: Ambulatory Visit | Attending: Student | Admitting: Student

## 2022-09-01 DIAGNOSIS — E569 Vitamin deficiency, unspecified: Secondary | ICD-10-CM | POA: Diagnosis present

## 2022-09-01 DIAGNOSIS — R569 Unspecified convulsions: Secondary | ICD-10-CM | POA: Insufficient documentation

## 2022-09-02 ENCOUNTER — Telehealth: Payer: Self-pay | Admitting: Obstetrics and Gynecology

## 2022-09-02 NOTE — Telephone Encounter (Signed)
Patient is calling needing to know if the medication she has been prescribe for fertility would show up in a drug test. Please advise?

## 2022-09-04 ENCOUNTER — Other Ambulatory Visit: Payer: Self-pay | Admitting: Obstetrics and Gynecology

## 2022-09-04 DIAGNOSIS — Z3169 Encounter for other general counseling and advice on procreation: Secondary | ICD-10-CM

## 2022-09-07 MED ORDER — LETROZOLE 2.5 MG PO TABS: 2.5000 mg | ORAL_TABLET | Freq: Every day | ORAL | 0 refills | Status: DC

## 2022-09-07 NOTE — Telephone Encounter (Signed)
Pt called triage line to ask if higher Rx had been sent already. Aware new Rx sent.

## 2022-09-15 ENCOUNTER — Other Ambulatory Visit: Payer: Self-pay

## 2022-09-15 ENCOUNTER — Emergency Department: Payer: Medicaid Other

## 2022-09-15 ENCOUNTER — Emergency Department
Admission: EM | Admit: 2022-09-15 | Discharge: 2022-09-15 | Discharge status: Against Medical Advice | Payer: Medicaid Other | Attending: Emergency Medicine | Admitting: Emergency Medicine

## 2022-09-15 ENCOUNTER — Encounter: Payer: Self-pay | Admitting: Emergency Medicine

## 2022-09-15 DIAGNOSIS — R079 Chest pain, unspecified: Secondary | ICD-10-CM | POA: Insufficient documentation

## 2022-09-15 DIAGNOSIS — Z5321 Procedure and treatment not carried out due to patient leaving prior to being seen by health care provider: Secondary | ICD-10-CM | POA: Diagnosis not present

## 2022-09-15 NOTE — ED Notes (Signed)
Lab came to draw blood on pt. Pt stated, "I am leaving to go to another hospital." Misty Stanley, RN made aware.

## 2022-09-15 NOTE — ED Triage Notes (Addendum)
Patient ambulatory to triage with steady gait, without difficulty or distress noted; pt reports c/o CP "all over into my neck and arms"; denies any other accomp symptoms; pt admits to snorting cocaine today

## 2022-10-20 ENCOUNTER — Other Ambulatory Visit: Payer: Self-pay | Admitting: Licensed Practical Nurse

## 2022-10-20 DIAGNOSIS — Z3169 Encounter for other general counseling and advice on procreation: Secondary | ICD-10-CM

## 2022-10-23 ENCOUNTER — Inpatient Hospital Stay: Payer: Medicaid Other | Attending: Internal Medicine

## 2022-10-23 DIAGNOSIS — E538 Deficiency of other specified B group vitamins: Secondary | ICD-10-CM | POA: Insufficient documentation

## 2022-10-23 DIAGNOSIS — N921 Excessive and frequent menstruation with irregular cycle: Secondary | ICD-10-CM | POA: Insufficient documentation

## 2022-10-23 DIAGNOSIS — D509 Iron deficiency anemia, unspecified: Secondary | ICD-10-CM | POA: Insufficient documentation

## 2022-10-23 DIAGNOSIS — D500 Iron deficiency anemia secondary to blood loss (chronic): Secondary | ICD-10-CM

## 2022-10-23 LAB — CBC WITH DIFFERENTIAL/PLATELET
Abs Immature Granulocytes: 0.06 10*3/uL (ref 0.00–0.07)
Basophils Absolute: 0.1 10*3/uL (ref 0.0–0.1)
Basophils Relative: 1 %
Eosinophils Absolute: 0.5 10*3/uL (ref 0.0–0.5)
Eosinophils Relative: 6 %
HCT: 30.5 % — ABNORMAL LOW (ref 36.0–46.0)
Hemoglobin: 8.5 g/dL — ABNORMAL LOW (ref 12.0–15.0)
Immature Granulocytes: 1 %
Lymphocytes Relative: 29 %
Lymphs Abs: 2.7 10*3/uL (ref 0.7–4.0)
MCH: 18.2 pg — ABNORMAL LOW (ref 26.0–34.0)
MCHC: 27.9 g/dL — ABNORMAL LOW (ref 30.0–36.0)
MCV: 65.2 fL — ABNORMAL LOW (ref 80.0–100.0)
Monocytes Absolute: 0.5 10*3/uL (ref 0.1–1.0)
Monocytes Relative: 6 %
Neutro Abs: 5.2 10*3/uL (ref 1.7–7.7)
Neutrophils Relative %: 57 %
Platelets: 443 10*3/uL — ABNORMAL HIGH (ref 150–400)
RBC: 4.68 MIL/uL (ref 3.87–5.11)
RDW: 17.6 % — ABNORMAL HIGH (ref 11.5–15.5)
WBC: 9.1 10*3/uL (ref 4.0–10.5)
nRBC: 0 % (ref 0.0–0.2)

## 2022-10-23 LAB — IRON AND TIBC
Iron: 23 ug/dL — ABNORMAL LOW (ref 28–170)
Saturation Ratios: 6 % — ABNORMAL LOW (ref 10.4–31.8)
TIBC: 392 ug/dL (ref 250–450)
UIBC: 369 ug/dL

## 2022-10-23 LAB — VITAMIN B12: Vitamin B-12: 259 pg/mL (ref 180–914)

## 2022-10-23 LAB — FERRITIN: Ferritin: 3 ng/mL — ABNORMAL LOW (ref 11–307)

## 2022-10-26 ENCOUNTER — Inpatient Hospital Stay (HOSPITAL_BASED_OUTPATIENT_CLINIC_OR_DEPARTMENT_OTHER): Payer: Medicaid Other | Admitting: Internal Medicine

## 2022-10-26 ENCOUNTER — Inpatient Hospital Stay: Payer: Medicaid Other

## 2022-10-26 VITALS — BP 119/65 | HR 75 | Temp 98.6°F | Resp 18

## 2022-10-26 DIAGNOSIS — N921 Excessive and frequent menstruation with irregular cycle: Secondary | ICD-10-CM

## 2022-10-26 DIAGNOSIS — E538 Deficiency of other specified B group vitamins: Secondary | ICD-10-CM

## 2022-10-26 DIAGNOSIS — D509 Iron deficiency anemia, unspecified: Secondary | ICD-10-CM | POA: Diagnosis not present

## 2022-10-26 DIAGNOSIS — D500 Iron deficiency anemia secondary to blood loss (chronic): Secondary | ICD-10-CM

## 2022-10-26 DIAGNOSIS — Z862 Personal history of diseases of the blood and blood-forming organs and certain disorders involving the immune mechanism: Secondary | ICD-10-CM

## 2022-10-26 LAB — URINALYSIS, COMPLETE (UACMP) WITH MICROSCOPIC
Bacteria, UA: NONE SEEN
Bilirubin Urine: NEGATIVE
Glucose, UA: NEGATIVE mg/dL
Hgb urine dipstick: NEGATIVE
Ketones, ur: NEGATIVE mg/dL
Leukocytes,Ua: NEGATIVE
Nitrite: NEGATIVE
Protein, ur: 30 mg/dL — AB
Specific Gravity, Urine: 1.028 (ref 1.005–1.030)
pH: 5 (ref 5.0–8.0)

## 2022-10-26 LAB — PREGNANCY, URINE: Preg Test, Ur: NEGATIVE

## 2022-10-26 MED ORDER — SODIUM CHLORIDE 0.9 % IV SOLN
INTRAVENOUS | Status: DC
  Filled 2022-10-26: qty 250

## 2022-10-26 MED ORDER — SODIUM CHLORIDE 0.9 % IV SOLN
200.0000 mg | Freq: Once | INTRAVENOUS | Status: AC
  Administered 2022-10-26: 200 mg via INTRAVENOUS
  Filled 2022-10-26: qty 200

## 2022-10-26 NOTE — Progress Notes (Signed)
[place]  [phone] [phone]  HEMATOLOGY-ONCOLOGY TELEMEDICINE VISIT PROGRESS NOTE  ID: Faith [name] OB: 26-Sep-1991  MR#: [id number]  XIP#:[id number]  Patient Care Team: [name], MD as PCP - General (Family Medicine) Jane Canary, MD as Consulting Physician (Oncology)   CHIEF COMPLAINT:  Faith [name] is a 32 y.o. female with past medical history as below  Patient reported lack of energy and increased sleeping.  Patient has history of iron deficiency anemia for long time now.  She reports metromenorrhagia with heavy menstrual bleeding at times requiring her to change pads or tampons every couple of hours.  Also associated with blood clots.  She has tried oral contraceptive and progesterone Depo in the past but had swelling.  She has 3 children with last pregnancy 1 year ago.  She is again trying to get pregnant.  She is followed by OB GYN for menstrual bleeding. She reports rare bleeding in stool when constipated. Denies gum bleeding, nosebleeding or bleeding in urine.  Denies gastric bypass surgery. She has tried oral iron but with no improvement in iron stores.  After first infusion of Feraheme, towards the end patient had a seizure and was sent to ER.  However this was thought to be related to her being out of seizure medication for several weeks.  During second infusion of Feraheme, she had an episode of chest pain and hypotension.  EKG showed NSR.  Infusion was discontinued.  She was switched to Venofer and was given 2 doses which she tolerated well.  INTERVAL HISTORY:  Patient was seen today in the infusion room receiving IV Venofer. She continues to have heavy menstrual period and reports changing her pads every 10 to 15 minutes associated with blood clots.  Heavy bleeding last 4 to 5 days.  Denies any blood in urine or stool.  Per RN report, her urine today was very dark.  She is following with OB/GYN and trying to get  pregnant.  REVIEW OF SYSTEMS:   Review of Systems  Constitutional:  Positive for malaise/fatigue.    As per HPI. Otherwise, a complete review of systems is negative.  PAST MEDICAL HISTORY: Past Medical History:  Diagnosis Date   ADHD    Anemia    BLOOD TRANSFUSION AFTER C-SECTION ON 06-2016-2 UNITS   Anxiety    Asthma    WELL CONTROLLED   Bipolar 1 disorder, depressed ([place])    COVID-19    Depression    Diabetes mellitus without complication (HCC)    Gallstones    GERD (gastroesophageal reflux disease)    Heart murmur    History of blood transfusion 06/2016   RECEIVED 2 UNITS OF BLOOD AFTER C SECTION   Migraines    Ovarian cyst    Seizures ([place])    LAST SEIZURE January 2022   Tubal pregnancy     PAST SURGICAL HISTORY: Past Surgical History:  Procedure Laterality Date   CESAREAN SECTION N/A 06/22/2016   Procedure: CESAREAN SECTION;  Surgeon: Malachy Mood, MD;  Location: [facility] OR[facility];  Service: Obstetrics;  Laterality: N/A;   CESAREAN SECTION N/A 10/12/2017   Procedure: CESAREAN SECTION;  Surgeon: Malachy Mood, MD;  Location: [facility];  Service: Obstetrics;  Laterality: N/A;   CESAREAN SECTION N/A 03/27/2021   Procedure: CESAREAN SECTION;  Surgeon: Homero Fellers, MD;  Location: [facility] OR[facility];  Service: Obstetrics;  Laterality: N/A;   CHOLECYSTECTOMY N/A 08/13/2016   Procedure: LAPAROSCOPIC CHOLECYSTECTOMY;  Surgeon: Jules Husbands, MD;  Location:  [facility] OR[facility];  Service: General;  Laterality: N/A;   DILATION AND CURETTAGE OF UTERUS     ovarian cyst removed     SALPINGECTOMY Right 2013   ectopic pregnancy. PER PATIENT, STILL HAS BOTH TUBES   TONSILLECTOMY     TONSILLECTOMY AND ADENOIDECTOMY      FAMILY HISTORY: Family History  Problem Relation Age of Onset   Heart disease Father    Huntington's disease Mother     ADVANCED DIRECTIVES (Y/N):  N  Allergies  Allergen Reactions   Ivp Dye [Iodinated Contrast Media] Anaphylaxis    Per patient, she does not have  problems with betadine   Latex Rash   Metrizamide Anaphylaxis   Morphine Other (See Comments)    bradycradia    Mushroom Extract Complex Anaphylaxis    Hives then swelling of throat and can not breath   Sulfasalazine Hives   Tomato Anaphylaxis   Toradol [Ketorolac Tromethamine] Anaphylaxis, Hives and Swelling   Aspirin Hives   Dilaudid [Hydromorphone Hcl] Hives   Lamictal [Lamotrigine] Hives   Sulfa Antibiotics Hives   Tramadol Swelling, Rash and Hives   Adhesive [Tape] Itching    USE PAPER TAPE   Iohexol Rash    (contrast dye) causes red bumps    Current Outpatient Medications  Medication Sig Dispense Refill   Accu-Chek Softclix Lancets lancets 1 each by Other route 4 (four) times daily. 100 each 12   acetaminophen (TYLENOL) 500 MG tablet Take 1,000 mg by mouth every 6 (six) hours as needed (for pain).     alprazolam (XANAX) 2 MG tablet Take by mouth.     amphetamine-dextroamphetamine (ADDERALL) 15 MG tablet Take 1 tablet by mouth 2 (two) times daily.     DULoxetine (CYMBALTA) 30 MG capsule Take 30 mg by mouth daily.     hydrocortisone (ANUSOL-HC) 25 MG suppository Place 1 suppository (25 mg total) rectally 2 (two) times daily. 12 suppository 1   ibuprofen (ADVIL) 600 MG tablet Take 600 mg by mouth every 6 (six) hours as needed.     Lacosamide (VIMPAT) 100 MG TABS Take 1 tablet (100 mg total) by mouth in the morning and at bedtime. Take 100 mg twice daily for the first week.  You can then increase to 150 mg which would be 1.5 pills twice a day. 80 tablet 0   Lacosamide (VIMPAT) 150 MG TABS TAKE 1 TABLET BY MOUTH TWICE DAILY FOR SEIZURE DISORDER     letrozole (FEMARA) 2.5 MG tablet Take 1 tablet (2.5 mg total) by mouth daily. On cycle days 3 through day 7 10 tablet 0   PARoxetine (PAXIL) 20 MG tablet SMARTSIG:1 Tablet(s) By Mouth Morning-Evening     Prenatal Vit-Fe Fumarate-FA (WESTAB PLUS) 27-1 MG TABS Take by mouth.     traZODone (DESYREL) 50 MG tablet TAKE 1 TABLET BY MOUTH  EVERY NIGHT AT BEDTIME AS NEEDED FOR INSOMNIA. INCREASE TO 2 TABLET AS NEEDED AFTER 4-5 DAYS     No current facility-administered medications for this visit.   Facility-Administered Medications Ordered in Other Visits  Medication Dose Route Frequency Provider Last Rate Last Admin   0.9 %  sodium chloride infusion   Intravenous Continuous Michaelyn Barter, MD   Stopped at 10/26/22 1506    OBJECTIVE: There were no vitals filed for this visit.   There is no height or weight on file to calculate BMI.    ECOG FS:1 - Symptomatic but completely ambulatory  Physical exam not performed  LAB RESULTS:  Lab Results  Component Value Date   NA 136 05/18/2022   K 4.8 05/18/2022   CL 107 05/18/2022   CO2 21 (L) 05/18/2022   GLUCOSE 92 05/18/2022   BUN 13 05/18/2022   CREATININE 0.64 05/18/2022   CALCIUM 8.6 (L) 05/18/2022   PROT 7.5 02/01/2022   ALBUMIN 4.2 02/01/2022   AST 11 (L) 02/01/2022   ALT 11 02/01/2022   ALKPHOS 57 02/01/2022   BILITOT 0.5 02/01/2022   GFRNONAA >60 05/18/2022   GFRAA >60 06/05/2020    Lab Results  Component Value Date   WBC 9.1 10/23/2022   NEUTROABS 5.2 10/23/2022   HGB 8.5 (L) 10/23/2022   HCT 30.5 (L) 10/23/2022   MCV 65.2 (L) 10/23/2022   PLT 443 (H) 10/23/2022      STUDIES: No results found.  ASSESSMENT:  Faith [name] is a 32 y.o. female was referred to hematology clinic for management of iron deficiency anemia.   Iron deficiency anemia -Likely source metro-menorrhagia.  -Patient received 2 doses of IV Feraheme but could not tolerate due to episode of chest pain and hypotension.  She was then transition to IV Venofer and did well.  She had her labs rechecked on Friday which is again showing iron deficiency anemia.  Urine pregnancy test was negative.  Will proceed with IV Venofer weekly x 5.  She continues to have heavy menstrual period's requiring pad changes every 10 to 15 minutes.  She is trying to get pregnant and as a result cannot use any  OCPs to help control the bleeding.  I discussed with her that she will have an ongoing need for IV Venofer since her source of bleeding has not been controlled.  Patient expressed understanding.  Per RN report, her urine was very dark-colored.  Will add UA with microscopic to rule out any RBCs.  Patient denies any UTI symptoms.  Will hold off on sending urine culture.  Menometrorrhagia - following with OBGYN  - discussed likely cause of IDA is heavy menstrual periods associated with blood clots.   Vitamin b12 deficiency -B12 216 on 05/21/2022 checked by her neurologist. -Advised to take OTC cyanocobalamin 1000 mcg daily.  Orders Placed This Encounter  Procedures   CBC with Differential/Platelet   Iron and TIBC   Ferritin   Vitamin B12   Schedule for IV Venofer weekly x 4 RTC in 4 months for MD visit, labs, possible Venofer     I discussed the assessment and treatment plan with the patient. The patient was provided an opportunity to ask questions and all were answered. The patient agreed with the plan and demonstrated an understanding of the instructions.    Jane Canary, MD 10/26/2022 3:21 PM

## 2022-10-30 ENCOUNTER — Encounter: Payer: Self-pay | Admitting: Internal Medicine

## 2022-10-30 MED FILL — Iron Sucrose Inj 20 MG/ML (Fe Equiv): INTRAVENOUS | Qty: 10 | Status: AC

## 2022-10-30 NOTE — Addendum Note (Signed)
Addended byJane Canary on: 10/30/2022 09:18 AM   Modules accepted: Orders

## 2022-11-02 ENCOUNTER — Inpatient Hospital Stay: Payer: Medicaid Other

## 2022-11-05 ENCOUNTER — Other Ambulatory Visit: Payer: Self-pay | Admitting: Licensed Practical Nurse

## 2022-11-05 DIAGNOSIS — Z3169 Encounter for other general counseling and advice on procreation: Secondary | ICD-10-CM

## 2022-11-05 MED FILL — Iron Sucrose Inj 20 MG/ML (Fe Equiv): INTRAVENOUS | Qty: 10 | Status: AC

## 2022-11-06 ENCOUNTER — Other Ambulatory Visit: Payer: Self-pay | Admitting: Nurse Practitioner

## 2022-11-06 ENCOUNTER — Inpatient Hospital Stay: Payer: Medicaid Other | Attending: Internal Medicine

## 2022-11-06 VITALS — BP 98/80 | HR 92 | Temp 98.0°F | Resp 16

## 2022-11-06 DIAGNOSIS — D509 Iron deficiency anemia, unspecified: Secondary | ICD-10-CM | POA: Diagnosis present

## 2022-11-06 DIAGNOSIS — D500 Iron deficiency anemia secondary to blood loss (chronic): Secondary | ICD-10-CM

## 2022-11-06 DIAGNOSIS — T50905A Adverse effect of unspecified drugs, medicaments and biological substances, initial encounter: Secondary | ICD-10-CM

## 2022-11-06 MED ORDER — SODIUM CHLORIDE 0.9 % IV SOLN
200.0000 mg | Freq: Once | INTRAVENOUS | Status: AC
  Administered 2022-11-06: 200 mg via INTRAVENOUS
  Filled 2022-11-06: qty 200

## 2022-11-06 MED ORDER — METHYLPREDNISOLONE SODIUM SUCC 125 MG IJ SOLR
125.0000 mg | Freq: Once | INTRAMUSCULAR | Status: AC
  Administered 2022-11-06: 125 mg via INTRAVENOUS

## 2022-11-06 MED ORDER — SODIUM CHLORIDE 0.9 % IV SOLN
INTRAVENOUS | Status: DC
  Filled 2022-11-06: qty 250

## 2022-11-06 NOTE — Progress Notes (Signed)
1519- Pt complains of chest pain and feeling flushed. Infusion stopped and NS started. Vitals obtained: BP-91/61, P-96, O2 99%. Called for assistance [place]- Beckey Rutter, NP, at chairside. Instructed to hold emergency meds at this time and reduce rate to half.  1526- rate infusing at half the initial rate. Fluids infusing concurrently. 1530-Pt states that chest pain and flushing have returned. Rate decreased to 1/4 initial rate.  1535- BP 78/34. NP remains at chairside. Instructed to give Solumedrol 125mg . Venofer paused.  1540- BP 73/50 1544- states that symptoms have resolved. BP 83/42.  1548- BP 86/62 1607-BP 93/48 1623- 98/80 (manual). Fluids completed.  1630- Per Beckey Rutter, NP, OK to discharge.

## 2022-11-06 NOTE — Progress Notes (Signed)
Patient seen in infusion d/t reaction to venofer. Patient has tolerated well previously. Today, she reports feeling hot and chest pressure. BP had dropped from baseline. She appears lethargic and pale which per nursing is similar to how she arrived to clinic. Came in wheelchair. Attempted slowing infusion but symptoms recurred and BP dropped. Opted to discontinue venofer. She received solumedrol 125 mg and bolus of fluids. BP improved. Mentation improved. 98/80 pressure on manual cuff.  Adjusted orders for next infusion to allow for slowed infusion and plan for pre-meds of fluids, claritin, and solumedrol. Patient agrees with plan.

## 2022-11-09 ENCOUNTER — Inpatient Hospital Stay: Payer: Medicaid Other

## 2022-11-13 MED FILL — Iron Sucrose Inj 20 MG/ML (Fe Equiv): INTRAVENOUS | Qty: 10 | Status: AC

## 2022-11-16 ENCOUNTER — Inpatient Hospital Stay: Payer: Medicaid Other

## 2022-11-23 ENCOUNTER — Inpatient Hospital Stay: Payer: Medicaid Other

## 2022-11-23 ENCOUNTER — Ambulatory Visit: Payer: Medicaid Other

## 2022-11-26 ENCOUNTER — Inpatient Hospital Stay: Payer: Medicaid Other

## 2022-11-27 MED FILL — Iron Sucrose Inj 20 MG/ML (Fe Equiv): INTRAVENOUS | Qty: 10 | Status: AC

## 2022-11-30 ENCOUNTER — Inpatient Hospital Stay: Payer: Medicaid Other

## 2022-12-03 MED FILL — Iron Sucrose Inj 20 MG/ML (Fe Equiv): INTRAVENOUS | Qty: 10 | Status: AC

## 2022-12-04 ENCOUNTER — Inpatient Hospital Stay: Payer: Medicaid Other | Attending: Internal Medicine

## 2023-02-25 ENCOUNTER — Ambulatory Visit: Payer: Medicaid Other | Admitting: Internal Medicine

## 2023-02-25 ENCOUNTER — Other Ambulatory Visit: Payer: Medicaid Other

## 2023-02-25 ENCOUNTER — Ambulatory Visit: Payer: Medicaid Other

## 2023-04-25 ENCOUNTER — Encounter: Payer: Self-pay | Admitting: Obstetrics and Gynecology

## 2023-04-26 ENCOUNTER — Encounter: Payer: Self-pay | Admitting: Internal Medicine

## 2023-05-19 ENCOUNTER — Encounter: Payer: MEDICAID | Admitting: Obstetrics and Gynecology

## 2023-05-19 ENCOUNTER — Encounter: Payer: Self-pay | Admitting: Obstetrics and Gynecology

## 2023-05-19 DIAGNOSIS — E282 Polycystic ovarian syndrome: Secondary | ICD-10-CM

## 2023-05-19 DIAGNOSIS — Z3169 Encounter for other general counseling and advice on procreation: Secondary | ICD-10-CM

## 2023-05-19 NOTE — Progress Notes (Signed)
Patient presents today to follow-up on fertility. She states completing three months of Femara. Patient reports regular monthly cycles, has not used ovulation predictor kits. She would like to discuss clomid today.

## 2023-05-19 NOTE — Progress Notes (Signed)
Patient left without being seen.

## 2023-06-01 ENCOUNTER — Telehealth (INDEPENDENT_AMBULATORY_CARE_PROVIDER_SITE_OTHER): Payer: MEDICAID | Admitting: Obstetrics and Gynecology

## 2023-06-01 ENCOUNTER — Encounter: Payer: Self-pay | Admitting: Obstetrics and Gynecology

## 2023-06-01 ENCOUNTER — Telehealth: Payer: Self-pay | Admitting: Obstetrics and Gynecology

## 2023-06-01 ENCOUNTER — Other Ambulatory Visit: Payer: Self-pay

## 2023-06-01 DIAGNOSIS — N979 Female infertility, unspecified: Secondary | ICD-10-CM

## 2023-06-01 NOTE — Telephone Encounter (Signed)
Patient aware that provider has requested she complete a HSG. Patient has been informed that the xray is to be completed a week after the start of her cycle and before the 12th day of her cycle. Patient states she had her cycle already this month(August) and will call next month(September). Patient has been made aware to contact the office on the 4th or 5th day of cycle to coordinate with Dr.Evans during lunch hours as he will need to be present for xray. Patient verbalized understanding.

## 2023-06-01 NOTE — Progress Notes (Signed)
Virtual Visit via Video Note  I connected with Faith [name] on 06/01/23 at  7:35 AM EDT by video and verified that I was speaking with the correct person using two identifiers.    Faith [name] is a 32 y.o. [id number] who LMP was Patient's last menstrual period was 04/22/2023. I discussed the limitations, risks, security and privacy concerns of performing an evaluation and management service by video and the availability of in person appointments. I also discussed with the patient that there may be a patient responsible charge related to this service. The patient expressed understanding and agreed to proceed.  Location of patient:  Home  Patient gave explicit verbal consent for video visit:  YES  Location of provider:  [facility] office  Persons other than physician and patient involved in provider conference:  None   Subjective:   History of Present Illness:    She has been attempting pregnancy for over a year.  She reportedly had a negative workup prior to that at [facility] and her partner had a semen analysis.  She was insistent upon going on an ovulation induction agent without prior OCP use.  She was started on letrozole and asked to follow-up in several months.  She continues to receive refills on her prescription. She presents today because she would like to switch to "Clomid".  She says that she is ovulatory but is not doing ovulation predictor kits.  She does states she is having monthly cycles.  She says that she is having intercourse "every day".  Hx: The following portions of the patient's history were reviewed and updated as appropriate:             She  has a past medical history of ADHD, Anemia, Anxiety, Asthma, Bipolar 1 disorder, depressed (HCC), COVID-19, Depression, Diabetes mellitus without complication (HCC), Gallstones, GERD (gastroesophageal reflux disease), Heart murmur, History of blood transfusion (06/2016), Migraines, Ovarian cyst, Seizures (HCC), and Tubal pregnancy. She  does not have any pertinent problems on file. She  has a past surgical history that includes Dilation and curettage of uterus; ovarian cyst removed; Tonsillectomy; Tonsillectomy and adenoidectomy; Salpingectomy (Right, 2013); Cholecystectomy (N/A, 08/13/2016); Cesarean section (N/A, 06/22/2016); Cesarean section (N/A, 10/12/2017); and Cesarean section (N/A, 03/27/2021). Her family history includes Heart disease in her father; Huntington's disease in her mother. She  reports that she has been smoking cigarettes. She has a 3.5 pack-year smoking history. She has never used smokeless tobacco. She reports that she does not currently use alcohol. She reports that she does not use drugs. She has a current medication list which includes the following prescription(s): accu-chek softclix lancets, acetaminophen, alprazolam, amphetamine-dextroamphetamine, duloxetine, hydrocortisone, ibuprofen, lacosamide, lacosamide, paroxetine, westab plus, and trazodone. She is allergic to ivp dye [iodinated contrast media], latex, metrizamide, morphine, mushroom extract complex, sulfasalazine, tomato, toradol [ketorolac tromethamine], aspirin, dilaudid [hydromorphone hcl], lamictal [lamotrigine], sulfa antibiotics, tramadol, venofer [iron sucrose], adhesive [tape], and iohexol.       Review of Systems:  Review of Systems  Constitutional: Denied constitutional symptoms, night sweats, recent illness, fatigue, fever, insomnia and weight loss.  Eyes: Denied eye symptoms, eye pain, photophobia, vision change and visual disturbance.  Ears/Nose/Throat/Neck: Denied ear, nose, throat or neck symptoms, hearing loss, nasal discharge, sinus congestion and sore throat.  Cardiovascular: Denied cardiovascular symptoms, arrhythmia, chest pain/pressure, edema, exercise intolerance, orthopnea and palpitations.  Respiratory: Denied pulmonary symptoms, asthma, pleuritic pain, productive sputum, cough, dyspnea and wheezing.  Gastrointestinal: Denied,  gastro-esophageal reflux, melena, nausea and vomiting.  Genitourinary:  Denied genitourinary symptoms including symptomatic vaginal discharge, pelvic relaxation issues, and urinary complaints.  Musculoskeletal: Denied musculoskeletal symptoms, stiffness, swelling, muscle weakness and myalgia.  Dermatologic: Denied dermatology symptoms, rash and scar.  Neurologic: Denied neurology symptoms, dizziness, headache, neck pain and syncope.  Psychiatric: Denied psychiatric symptoms, anxiety and depression.  Endocrine: Denied endocrine symptoms including hot flashes and night sweats.   Meds:   Current Outpatient Medications on File Prior to Visit  Medication Sig Dispense Refill   Accu-Chek Softclix Lancets lancets 1 each by Other route 4 (four) times daily. 100 each 12   acetaminophen (TYLENOL) 500 MG tablet Take 1,000 mg by mouth every 6 (six) hours as needed (for pain).     alprazolam (XANAX) 2 MG tablet Take by mouth.     amphetamine-dextroamphetamine (ADDERALL) 15 MG tablet Take 1 tablet by mouth 2 (two) times daily.     DULoxetine (CYMBALTA) 30 MG capsule Take 30 mg by mouth daily.     hydrocortisone (ANUSOL-HC) 25 MG suppository Place 1 suppository (25 mg total) rectally 2 (two) times daily. 12 suppository 1   ibuprofen (ADVIL) 600 MG tablet Take 600 mg by mouth every 6 (six) hours as needed.     Lacosamide (VIMPAT) 100 MG TABS Take 1 tablet (100 mg total) by mouth in the morning and at bedtime. Take 100 mg twice daily for the first week.  You can then increase to 150 mg which would be 1.5 pills twice a day. 80 tablet 0   Lacosamide (VIMPAT) 150 MG TABS TAKE 1 TABLET BY MOUTH TWICE DAILY FOR SEIZURE DISORDER (Patient not taking: Reported on 05/19/2023)     PARoxetine (PAXIL) 20 MG tablet SMARTSIG:1 Tablet(s) By Mouth Morning-Evening     Prenatal Vit-Fe Fumarate-FA (WESTAB PLUS) 27-1 MG TABS Take by mouth. (Patient not taking: Reported on 05/19/2023)     traZODone (DESYREL) 50 MG tablet TAKE 1 TABLET  BY MOUTH EVERY NIGHT AT BEDTIME AS NEEDED FOR INSOMNIA. INCREASE TO 2 TABLET AS NEEDED AFTER 4-5 DAYS     No current facility-administered medications on file prior to visit.    Assessment:    [id number] Patient Active Problem List   Diagnosis Date Noted   B12 deficiency 06/24/2022   Iron deficiency anemia 05/13/2022   Menometrorrhagia 05/13/2022   History of cesarean section 03/27/2021   Diabetes mellitus affecting pregnancy, unspecified trimester 09/19/2020   Hidradenitis suppurativa 05/24/2018   Bipolar disorder (HCC) 10/01/2017   History of cesarean delivery 07/12/2017   Family history of Huntington's disease    Asthma affecting pregnancy, antepartum 05/10/2017   Smoking (tobacco) complicating pregnancy, unspecified trimester 05/10/2017   Seizure disorder during pregnancy, third trimester (HCC) 05/10/2017   History of anemia 04/19/2017   History of maternal blood transfusion, currently pregnant 04/19/2017   ADD (attention deficit disorder) 07/17/2015   Post traumatic stress disorder (PTSD) 06/16/2013   Depression 06/15/2013   Suicidal ideation 06/15/2013   Status epilepticus (HCC) 06/14/2013   Diabetes mellitus type 2 in obese 06/14/2013     1. Infertility, female     Based on what she is telling me she does seem ovulatory even though she is not using ovulation predictor kits as directed.  She has reported that her husband has a normal semen analysis.  They are having daily intercourse.  This has not resulted in pregnancy.  Consideration should now be with physical possibilities. Have recommended HSG to check uterine cavity and fallopian tubes   Plan:  1.  Patient to schedule HSG. Orders No orders of the defined types were placed in this encounter.   No orders of the defined types were placed in this encounter.     F/U  No follow-ups on file. I spent 31 minutes involved in the care of this patient preparing to see the patient by obtaining and reviewing her  medical history (including labs, imaging tests and prior procedures), documenting clinical information in the electronic health record (EHR), counseling and coordinating care plans, writing and sending prescriptions, ordering tests or procedures and in direct communicating with the patient and medical staff discussing pertinent items from her history and physical exam.   Elonda Husky, M.D. 06/01/2023 8:23 AM

## 2023-06-17 ENCOUNTER — Emergency Department (HOSPITAL_COMMUNITY): Payer: MEDICAID

## 2023-06-17 ENCOUNTER — Other Ambulatory Visit: Payer: Self-pay

## 2023-06-17 ENCOUNTER — Encounter (HOSPITAL_COMMUNITY): Payer: Self-pay | Admitting: Emergency Medicine

## 2023-06-17 ENCOUNTER — Inpatient Hospital Stay (HOSPITAL_COMMUNITY): Payer: MEDICAID

## 2023-06-17 ENCOUNTER — Inpatient Hospital Stay (HOSPITAL_COMMUNITY)
Admission: EM | Admit: 2023-06-17 | Discharge: 2023-06-18 | DRG: 101 | Disposition: A | Discharge status: Home / Self Care | Payer: MEDICAID | Attending: Internal Medicine | Admitting: Internal Medicine

## 2023-06-17 DIAGNOSIS — F1520 Other stimulant dependence, uncomplicated: Secondary | ICD-10-CM | POA: Diagnosis present

## 2023-06-17 DIAGNOSIS — E669 Obesity, unspecified: Secondary | ICD-10-CM | POA: Diagnosis present

## 2023-06-17 DIAGNOSIS — D649 Anemia, unspecified: Secondary | ICD-10-CM | POA: Diagnosis present

## 2023-06-17 DIAGNOSIS — Z885 Allergy status to narcotic agent status: Secondary | ICD-10-CM

## 2023-06-17 DIAGNOSIS — F4310 Post-traumatic stress disorder, unspecified: Secondary | ICD-10-CM | POA: Diagnosis present

## 2023-06-17 DIAGNOSIS — N390 Urinary tract infection, site not specified: Secondary | ICD-10-CM | POA: Diagnosis present

## 2023-06-17 DIAGNOSIS — Z5986 Financial insecurity: Secondary | ICD-10-CM

## 2023-06-17 DIAGNOSIS — G40909 Epilepsy, unspecified, not intractable, without status epilepticus: Principal | ICD-10-CM | POA: Diagnosis present

## 2023-06-17 DIAGNOSIS — J45909 Unspecified asthma, uncomplicated: Secondary | ICD-10-CM | POA: Diagnosis present

## 2023-06-17 DIAGNOSIS — F909 Attention-deficit hyperactivity disorder, unspecified type: Secondary | ICD-10-CM | POA: Diagnosis present

## 2023-06-17 DIAGNOSIS — Z5982 Transportation insecurity: Secondary | ICD-10-CM

## 2023-06-17 DIAGNOSIS — Z91410 Personal history of adult physical and sexual abuse: Secondary | ICD-10-CM

## 2023-06-17 DIAGNOSIS — F419 Anxiety disorder, unspecified: Secondary | ICD-10-CM | POA: Diagnosis present

## 2023-06-17 DIAGNOSIS — Z8249 Family history of ischemic heart disease and other diseases of the circulatory system: Secondary | ICD-10-CM | POA: Diagnosis not present

## 2023-06-17 DIAGNOSIS — F1410 Cocaine abuse, uncomplicated: Secondary | ICD-10-CM | POA: Diagnosis present

## 2023-06-17 DIAGNOSIS — F4321 Adjustment disorder with depressed mood: Secondary | ICD-10-CM | POA: Diagnosis present

## 2023-06-17 DIAGNOSIS — Z8616 Personal history of COVID-19: Secondary | ICD-10-CM

## 2023-06-17 DIAGNOSIS — R45851 Suicidal ideations: Secondary | ICD-10-CM

## 2023-06-17 DIAGNOSIS — Z79899 Other long term (current) drug therapy: Secondary | ICD-10-CM

## 2023-06-17 DIAGNOSIS — Z87828 Personal history of other (healed) physical injury and trauma: Secondary | ICD-10-CM | POA: Diagnosis not present

## 2023-06-17 DIAGNOSIS — F17210 Nicotine dependence, cigarettes, uncomplicated: Secondary | ICD-10-CM | POA: Diagnosis present

## 2023-06-17 DIAGNOSIS — Z6835 Body mass index (BMI) 35.0-35.9, adult: Secondary | ICD-10-CM

## 2023-06-17 DIAGNOSIS — F319 Bipolar disorder, unspecified: Secondary | ICD-10-CM | POA: Diagnosis present

## 2023-06-17 DIAGNOSIS — K219 Gastro-esophageal reflux disease without esophagitis: Secondary | ICD-10-CM | POA: Diagnosis present

## 2023-06-17 DIAGNOSIS — E119 Type 2 diabetes mellitus without complications: Secondary | ICD-10-CM | POA: Diagnosis present

## 2023-06-17 DIAGNOSIS — Z91199 Patient's noncompliance with other medical treatment and regimen due to unspecified reason: Secondary | ICD-10-CM

## 2023-06-17 DIAGNOSIS — T426X6A Underdosing of other antiepileptic and sedative-hypnotic drugs, initial encounter: Secondary | ICD-10-CM | POA: Diagnosis present

## 2023-06-17 DIAGNOSIS — Z9049 Acquired absence of other specified parts of digestive tract: Secondary | ICD-10-CM

## 2023-06-17 DIAGNOSIS — R569 Unspecified convulsions: Secondary | ICD-10-CM | POA: Diagnosis not present

## 2023-06-17 DIAGNOSIS — Z91040 Latex allergy status: Secondary | ICD-10-CM

## 2023-06-17 DIAGNOSIS — F445 Conversion disorder with seizures or convulsions: Secondary | ICD-10-CM | POA: Diagnosis not present

## 2023-06-17 DIAGNOSIS — Z91041 Radiographic dye allergy status: Secondary | ICD-10-CM

## 2023-06-17 DIAGNOSIS — Z882 Allergy status to sulfonamides status: Secondary | ICD-10-CM | POA: Diagnosis not present

## 2023-06-17 DIAGNOSIS — Z886 Allergy status to analgesic agent status: Secondary | ICD-10-CM

## 2023-06-17 DIAGNOSIS — F1994 Other psychoactive substance use, unspecified with psychoactive substance-induced mood disorder: Secondary | ICD-10-CM | POA: Diagnosis present

## 2023-06-17 DIAGNOSIS — Z91018 Allergy to other foods: Secondary | ICD-10-CM

## 2023-06-17 LAB — RAPID URINE DRUG SCREEN, HOSP PERFORMED
Amphetamines: NOT DETECTED
Barbiturates: NOT DETECTED
Benzodiazepines: POSITIVE — AB
Cocaine: POSITIVE — AB
Opiates: NOT DETECTED
Tetrahydrocannabinol: NOT DETECTED

## 2023-06-17 LAB — COMPREHENSIVE METABOLIC PANEL
ALT: 14 U/L (ref 0–44)
AST: 20 U/L (ref 15–41)
Albumin: 4.3 g/dL (ref 3.5–5.0)
Alkaline Phosphatase: 77 U/L (ref 38–126)
Anion gap: 13 (ref 5–15)
BUN: 8 mg/dL (ref 6–20)
CO2: 22 mmol/L (ref 22–32)
Calcium: 9.2 mg/dL (ref 8.9–10.3)
Chloride: 98 mmol/L (ref 98–111)
Creatinine, Ser: 0.6 mg/dL (ref 0.44–1.00)
GFR, Estimated: >60 mL/min (ref >60)
Glucose, Bld: 96 mg/dL (ref 70–99)
Potassium: 3.3 mmol/L — ABNORMAL LOW (ref 3.5–5.1)
Sodium: 133 mmol/L — ABNORMAL LOW (ref 135–145)
Total Bilirubin: 0.6 mg/dL (ref 0.3–1.2)
Total Protein: 8.3 g/dL — ABNORMAL HIGH (ref 6.5–8.1)

## 2023-06-17 LAB — CBC WITH DIFFERENTIAL/PLATELET
Abs Immature Granulocytes: 0.09 10*3/uL — ABNORMAL HIGH (ref 0.00–0.07)
Basophils Absolute: 0.1 10*3/uL (ref 0.0–0.1)
Basophils Relative: 1 %
Eosinophils Absolute: 0.3 10*3/uL (ref 0.0–0.5)
Eosinophils Relative: 3 %
HCT: 33.1 % — ABNORMAL LOW (ref 36.0–46.0)
Hemoglobin: 8.8 g/dL — ABNORMAL LOW (ref 12.0–15.0)
Immature Granulocytes: 1 %
Lymphocytes Relative: 19 %
Lymphs Abs: 2.4 10*3/uL (ref 0.7–4.0)
MCH: 16.6 pg — ABNORMAL LOW (ref 26.0–34.0)
MCHC: 26.6 g/dL — ABNORMAL LOW (ref 30.0–36.0)
MCV: 62.6 fL — ABNORMAL LOW (ref 80.0–100.0)
Monocytes Absolute: 0.7 10*3/uL (ref 0.1–1.0)
Monocytes Relative: 6 %
Neutro Abs: 9.1 10*3/uL — ABNORMAL HIGH (ref 1.7–7.7)
Neutrophils Relative %: 70 %
Platelets: 404 10*3/uL — ABNORMAL HIGH (ref 150–400)
RBC: 5.29 MIL/uL — ABNORMAL HIGH (ref 3.87–5.11)
RDW: 20.1 % — ABNORMAL HIGH (ref 11.5–15.5)
WBC: 12.8 10*3/uL — ABNORMAL HIGH (ref 4.0–10.5)
nRBC: 0 % (ref 0.0–0.2)

## 2023-06-17 LAB — HIV ANTIBODY (ROUTINE TESTING W REFLEX): HIV Screen 4th Generation wRfx: NONREACTIVE

## 2023-06-17 LAB — ETHANOL: Alcohol, Ethyl (B): 50 mg/dL — ABNORMAL HIGH (ref <10)

## 2023-06-17 LAB — CBG MONITORING, ED
Glucose-Capillary: 107 mg/dL — ABNORMAL HIGH (ref 70–99)
Glucose-Capillary: 102 mg/dL — ABNORMAL HIGH (ref 70–99)

## 2023-06-17 LAB — MAGNESIUM: Magnesium: 2.2 mg/dL (ref 1.7–2.4)

## 2023-06-17 LAB — HCG, SERUM, QUALITATIVE: Preg, Serum: NEGATIVE

## 2023-06-17 MED ORDER — ACETAMINOPHEN 325 MG PO TABS: 650.0000 mg | ORAL_TABLET | Freq: Four times a day (QID) | ORAL | Status: DC | PRN

## 2023-06-17 MED ORDER — ALBUTEROL SULFATE (2.5 MG/3ML) 0.083% IN NEBU: 2.5000 mg | INHALATION_SOLUTION | RESPIRATORY_TRACT | Status: DC | PRN

## 2023-06-17 MED ORDER — TRAZODONE HCL 50 MG PO TABS: 25.0000 mg | ORAL_TABLET | Freq: Every evening | ORAL | Status: DC | PRN

## 2023-06-17 MED ORDER — ACETAMINOPHEN 650 MG RE SUPP: 650.0000 mg | Freq: Four times a day (QID) | RECTAL | Status: DC | PRN

## 2023-06-17 MED ORDER — LEVETIRACETAM IN NACL 1500 MG/100ML IV SOLN
1500.0000 mg | Freq: Once | INTRAVENOUS | Status: AC
  Administered 2023-06-17: 1500 mg via INTRAVENOUS
  Filled 2023-06-17: qty 100

## 2023-06-17 MED ORDER — ONDANSETRON HCL 4 MG PO TABS: 4.0000 mg | ORAL_TABLET | Freq: Four times a day (QID) | ORAL | Status: DC | PRN

## 2023-06-17 MED ORDER — LORAZEPAM 2 MG/ML IJ SOLN
2.0000 mg | Freq: Once | INTRAMUSCULAR | Status: AC
  Administered 2023-06-17: 2 mg via INTRAVENOUS
  Filled 2023-06-17: qty 1

## 2023-06-17 MED ORDER — ONDANSETRON HCL 4 MG/2ML IJ SOLN: 4.0000 mg | Freq: Four times a day (QID) | INTRAMUSCULAR | Status: DC | PRN

## 2023-06-17 MED ORDER — ENOXAPARIN SODIUM 40 MG/0.4ML IJ SOSY
40.0000 mg | PREFILLED_SYRINGE | INTRAMUSCULAR | Status: DC
  Administered 2023-06-17 – 2023-06-18 (×2): 40 mg via SUBCUTANEOUS
  Filled 2023-06-17 (×2): qty 0.4

## 2023-06-17 MED ORDER — AMMONIA AROMATIC IN INHA
RESPIRATORY_TRACT | Status: AC
  Filled 2023-06-17: qty 10

## 2023-06-17 NOTE — ED Notes (Signed)
Patient belongings collected and labeled. Patient placed $10 into pink container with patient label on it. Cell phone, shirt, and bra placed in bag.
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
## 2023-06-17 NOTE — ED Notes (Signed)
[employer] called for update on possible arrival time. Stated they are waiting for an officer to free up.

## 2023-06-17 NOTE — ED Provider Notes (Signed)
[facility]-EMERGENCY DEPT [facility] Emergency Department Provider Note MRN:  [id number]  Arrival date & time: 06/17/23     Chief Complaint   Seizures   History of Present Illness   Faith [name] is a 32 y.o. year-old female presents to the ED with chief complaint of seizure-like activity.  Patient had 1 witnessed seizure like episode and then EMS was called when she became unresponsive.  EMS reports 2-3 additional seizure-like episodes.  She has history of the same.  Takes Lamictal.  Has reportedly been suffering from cocaine abuse as well as dealing with potentially losing her children because of her substance abuse.  She is unable to provide much history due to being nonverbal at arrival and having received 10mg  IV versed with EMS.     Review of Systems  Pertinent positive and negative review of systems noted in HPI.    Physical Exam   Vitals:   06/17/23 1638 06/17/23 1959  BP: 104/63 (!) 93/58  Pulse: 86 82  Resp:  16  Temp: 98 F (36.7 C)   SpO2:  100%    CONSTITUTIONAL:  non toxic-appearing, NAD NEURO:  Alert and oriented x 3, CN 3-12 grossly intact EYES:  eyes equal and reactive ENT/NECK:  Supple, no stridor  CARDIO:  tachycardic, regular rhythm, appears well-perfused  PULM:  No respiratory distress,  GI/GU:  non-distended,  MSK/SPINE:  No gross deformities, no edema, moves all extremities  SKIN:  no rash, atraumatic   *Additional and/or pertinent findings included in MDM below  Diagnostic and Interventional Summary    EKG Interpretation Date/Time:    Ventricular Rate:    PR Interval:    QRS Duration:    QT Interval:    QTC Calculation:   R Axis:      Text Interpretation:         Labs Reviewed  COMPREHENSIVE METABOLIC PANEL - Abnormal; Notable for the following components:      Result Value   Sodium 133 (*)    Potassium 3.3 (*)    Total Protein 8.3 (*)    All other components within normal limits  CBC WITH DIFFERENTIAL/PLATELET - Abnormal;  Notable for the following components:   WBC 12.8 (*)    RBC 5.29 (*)    Hemoglobin 8.8 (*)    HCT 33.1 (*)    MCV 62.6 (*)    MCH 16.6 (*)    MCHC 26.6 (*)    RDW 20.1 (*)    Platelets 404 (*)    Neutro Abs 9.1 (*)    Abs Immature Granulocytes 0.09 (*)    All other components within normal limits  RAPID URINE DRUG SCREEN, HOSP PERFORMED - Abnormal; Notable for the following components:   Cocaine POSITIVE (*)    Benzodiazepines POSITIVE (*)    All other components within normal limits  ETHANOL - Abnormal; Notable for the following components:   Alcohol, Ethyl (B) 50 (*)    All other components within normal limits  CBG MONITORING, ED - Abnormal; Notable for the following components:   Glucose-Capillary 107 (*)    All other components within normal limits  CBG MONITORING, ED - Abnormal; Notable for the following components:   Glucose-Capillary 102 (*)    All other components within normal limits  HCG, SERUM, QUALITATIVE  MAGNESIUM  HIV ANTIBODY (ROUTINE TESTING W REFLEX)  BASIC METABOLIC PANEL  CBC    CT Head Wo Contrast  Final Result      Medications  enoxaparin (LOVENOX)  injection 40 mg (40 mg Subcutaneous Given 06/17/23 0938)  acetaminophen (TYLENOL) tablet 650 mg (has no administration in time range)    Or  acetaminophen (TYLENOL) suppository 650 mg (has no administration in time range)  traZODone (DESYREL) tablet 25 mg (has no administration in time range)  ondansetron (ZOFRAN) tablet 4 mg (has no administration in time range)    Or  ondansetron (ZOFRAN) injection 4 mg (has no administration in time range)  albuterol (PROVENTIL) (2.5 MG/3ML) 0.083% nebulizer solution 2.5 mg (has no administration in time range)  levETIRAcetam (KEPPRA) IVPB 1500 mg/ 100 mL premix (0 mg Intravenous Stopped 06/17/23 0445)  ammonia inhalant (  Given by Other 06/17/23 0432)  LORazepam (ATIVAN) injection 2 mg (2 mg Intravenous Given 06/17/23 0433)     Procedures  /  Critical  Care Procedures  ED Course and Medical Decision Making  I have reviewed the triage vital signs, the nursing notes, and pertinent available records from the EMR.  Social Determinants Affecting Complexity of Care: Patient suffers from drug abuse/addiction.   ED Course:    Medical Decision Making Patient here with polysubstance abuse and concern for functional seizures.  She has been given Versed by EMS and Ativan while in the ED.  Seen by and discussed with Dr. Madilyn Hook.  Dr. Madilyn Hook spoke with neurology, Dr. Derry Lory.  Patient may need spot EEG.  Patient returns from CT claiming that she is suicidal.  Amount and/or Complexity of Data Reviewed Labs: ordered. Radiology: ordered.  Risk Prescription drug management. Decision regarding hospitalization.         Consultants: Dr. Madilyn Hook spoke with Dr. Derry Lory   Treatment and Plan: Patient signed out to oncoming team.    Plan: Follow-up on head CT Probable spot EEG per neurology, awaiting final recs Will need TTS consult for SI    Final Clinical Impressions(s) / ED Diagnoses     ICD-10-CM   1. Seizure (HCC)  R56.9     2. Suicidal ideation  R45.851       ED Discharge Orders     None         Discharge Instructions Discussed with and Provided to Patient:   Discharge Instructions   None      Roxy Horseman, Cordelia Poche 06/17/23 2230    Tilden Fossa, MD 06/20/23 2248

## 2023-06-17 NOTE — Consult Note (Signed)
NEUROLOGY CONSULTATION  Reason for Consult:Seizure  CC: Seizure HPI   Faith [name] is a 32 y.o. female with a past medical history of ADHD, anxiety, asthma, bipolar 1, depression, diabetes, GERD, heart murmur, migraines, seizures presenting with seizure.  She states she was sitting and watching TV and that is the last thing she remembers prior to coming to the hospital.  She has not taken any of her medications in months as she cannot afford them.  She states that she thinks her outpatient neurologist is planning to put her on Keppra in addition to the vimpat she is prescribed and she has an intolerance to Lamictal.  Per EMS report she had 1 seizure prior to the arrival of EMS and a 3 and route to the hospital.  She was given 10 mg of Versed IV.  Upon chart review it appears that she had a number of seizure-like episodes in the ED at [facility] prior to her arrival to [facility]. She recieeved 1500mg  of keppra and 2mg  of ativan. She follows with Dr. Sherryll Burger outpatient and her only prescribed AED at this time is Vimpat.  In a note from 09/08/2022 it is stated that she has not tolerated Keppra in the past, however this cannot be confirmed by the patient.  She was supposed to have a sleep deprived EEG however this is not been completed yet.  She states her first seizure happened when she was approximately 32 years old.  Previous EEG from 2017 was normal.  She states that she is not currently drinking alcohol or using any illicit drugs at this time and she understands that they can lower her seizure threshold.  UDS was positive for cocaine.   Current AEDs:  Received 1500mg  keppra 2mg  ativan 0433   History is obtained from: Patient, chart review     ROS: Neurological: positive for dizziness, seizures, and tremors Behavioral/Psych: positive for depression, illegal drug usage, and irritability   Past Medical History:  Past Medical History:  Diagnosis Date   ADHD    Anemia    BLOOD  TRANSFUSION AFTER C-SECTION ON 06-2016-2 UNITS   Anxiety    Asthma    WELL CONTROLLED   Bipolar 1 disorder, depressed (HCC)    COVID-19    Depression    Diabetes mellitus without complication (HCC)    Gallstones    GERD (gastroesophageal reflux disease)    Heart murmur    History of blood transfusion 06/2016   RECEIVED 2 UNITS OF BLOOD AFTER C SECTION   Migraines    Ovarian cyst    Seizures (HCC)    LAST SEIZURE January 2022   Tubal pregnancy     Family History Family History  Problem Relation Age of Onset   Heart disease Father    Huntington's disease Mother     Allergies:  Allergies  Allergen Reactions   Ivp Dye [Iodinated Contrast Media] Anaphylaxis    Per patient, she does not have problems with betadine   Latex Rash   Metrizamide Anaphylaxis   Morphine Other (See Comments)    bradycradia    Mushroom Extract Complex Anaphylaxis    Hives then swelling of throat and can not breath   Sulfasalazine Hives   Tomato Anaphylaxis   Toradol [Ketorolac Tromethamine] Anaphylaxis, Hives and Swelling   Aspirin Hives   Dilaudid [Hydromorphone Hcl] Hives   Lamictal [Lamotrigine] Hives   Sulfa Antibiotics Hives   Tramadol Swelling, Rash and Hives   Venofer [Iron Sucrose] Other (See  Comments)    Chest pain, warm sensation, hypotension   Adhesive [Tape] Itching    USE PAPER TAPE   Iohexol Rash    (contrast dye) causes red bumps    Social History:   reports that she has been smoking cigarettes. She has a 3.5 pack-year smoking history. She has never used smokeless tobacco. She reports that she does not currently use alcohol. She reports that she does not use drugs.    Medications Medications Prior to Admission  Medication Sig Dispense Refill   Accu-Chek Softclix Lancets lancets 1 each by Other route 4 (four) times daily. 100 each 12   acetaminophen (TYLENOL) 500 MG tablet Take 1,000 mg by mouth every 6 (six) hours as needed (for pain).     alprazolam (XANAX) 2 MG tablet  Take by mouth.     amphetamine-dextroamphetamine (ADDERALL) 15 MG tablet Take 1 tablet by mouth 2 (two) times daily.     DULoxetine (CYMBALTA) 30 MG capsule Take 30 mg by mouth daily.     hydrocortisone (ANUSOL-HC) 25 MG suppository Place 1 suppository (25 mg total) rectally 2 (two) times daily. 12 suppository 1   ibuprofen (ADVIL) 600 MG tablet Take 600 mg by mouth every 6 (six) hours as needed.     Lacosamide (VIMPAT) 100 MG TABS Take 1 tablet (100 mg total) by mouth in the morning and at bedtime. Take 100 mg twice daily for the first week.  You can then increase to 150 mg which would be 1.5 pills twice a day. 80 tablet 0   Lacosamide (VIMPAT) 150 MG TABS TAKE 1 TABLET BY MOUTH TWICE DAILY FOR SEIZURE DISORDER (Patient not taking: Reported on 05/19/2023)     PARoxetine (PAXIL) 20 MG tablet SMARTSIG:1 Tablet(s) By Mouth Morning-Evening     Prenatal Vit-Fe Fumarate-FA (WESTAB PLUS) 27-1 MG TABS Take by mouth. (Patient not taking: Reported on 05/19/2023)     traZODone (DESYREL) 50 MG tablet TAKE 1 TABLET BY MOUTH EVERY NIGHT AT BEDTIME AS NEEDED FOR INSOMNIA. INCREASE TO 2 TABLET AS NEEDED AFTER 4-5 DAYS      EXAMINATION  Current vital signs:    06/17/2023    4:38 PM 06/17/2023    3:08 PM 06/17/2023    2:15 PM  Vitals with BMI  Systolic 104 110 161  Diastolic 63 61 60  Pulse 86 100 80    Examination:  GENERAL: Awake, alert in NAD HEENT: - Normocephalic and atraumatic, dry mm, no lymphadenopathy, no Thyromegally LUNGS - Regular, unlabored respirations CV - S1S2 RRR, equal pulses bilaterally. ABDOMEN - Soft, nontender, nondistended with normoactive BS Ext: warm, well perfused, intact peripheral pulses, no pedal edema Integumentary:  Skin intact on clothed exam  NEURO:  Mental Status: States she is drowsy Language: speech is clear.  Intact naming, repetition, fluency, and comprehension. Cranial Nerves:  II: PERRL. Visual fields full to confrontation.  III, IV, VI: EOM intact. Eyelids  elevate symmetrically. Blinks to threat.  V: Sensation intact symmetrically  VII: no facial asymmetry   VIII: hearing intact to voice IX, X: Palate elevates symmetrically. Phonation is normal.  WR:[id number] shrug 5/5 and symmetrical  XII: tongue is midline without fasciculations. Motor:  Elevates all extremities antigravity however confrontational strength testing is effort dependent.  Hand grasp weak but equal Tone: is normal and bulk is normal DTRs: 2+ and symmetrical throughout   Sensation- Intact to light touch Coordination: FTN intact bilaterally, no ataxia in BLE, slight distractible tremor noted in upper extremities Gait- deferred  LABS  I have reviewed labs in epic and the results pertinent to this consultation are:  No results found for: "[facility]" Lab Results  Component Value Date   ALT 14 06/17/2023   AST 20 06/17/2023   ALKPHOS 77 06/17/2023   BILITOT 0.6 06/17/2023   Lab Results  Component Value Date   HGBA1C 5.4 03/27/2021   Lab Results  Component Value Date   WBC 12.8 (H) 06/17/2023   HGB 8.8 (L) 06/17/2023   HCT 33.1 (L) 06/17/2023   MCV 62.6 (L) 06/17/2023   PLT 404 (H) 06/17/2023   Lab Results  Component Value Date   VITAMINB12 259 10/23/2022   No results found for: "[facility]OL[facility]" Lab Results  Component Value Date   NA 133 (L) 06/17/2023   K 3.3 (L) 06/17/2023   CL 98 06/17/2023   CO2 22 06/17/2023    DIAGNOSTIC IMAGING/PROCEDURES  I have reviewed the images obtained:, as below    CT-head-no acute intracranial process  MRI brain from 09/02/2022-normal MRI of the brain  EEG pending  Assessment:  32 y.o. female with a pertinent medical history of ADHD, anxiety, asthma, bipolar 1, depression, diabetes, GERD, heart murmur, migraines, seizures presenting with seizure.  She is supposed to be on Vimpat per most recent neurology note from 09/2022.  She states she is not currently taking her medications.  Additionally she denies illicit drug use,  however UDS is positive for cocaine.  She is aware that drug use can lower her seizure threshold.  She does not have any recollection of seizure-like activity today or yesterday.  She was loaded with Keppra 1500 mg and given 2 mg of Ativan in the ED at [facility].  Plan to initiate overnight EEG for spell capture.   Impression: Seizure in the setting of medication noncompliance versus PNES  Recommendations: - Overnight EEG ordered   **This documentation was dictated using Dragon Medical Software and may contain inadvertent errors **  NEUROHOSPITALIST ADDENDUM Performed a face to face diagnostic evaluation.   I have reviewed the contents of history and physical exam as documented by PA/ARNP/Resident and agree with above documentation.  I have discussed and formulated the above plan as documented. Edits to the note have been made as needed.  Impression/Key exam findings/Plan: reports snorting a lot of cocaine but tells me she was forced to do it. With multiple spells that were hard to characterize clinically per my discussion with EDP, plan to put her up on LTM for spell capture.  Erick Blinks, MD [facility] [phone]   If 7pm to 7am, please call on call as listed on AMION.

## 2023-06-17 NOTE — ED Notes (Signed)
Pt was arguing on her cell phone in the ct room and states that she would just kill herself. Pt then started having a seizure.

## 2023-06-17 NOTE — Progress Notes (Signed)
LTM EEG running - no initial skin breakdown - push button tested - neuro notified.  

## 2023-06-17 NOTE — Plan of Care (Signed)
  Problem: Health Behavior/Discharge Planning: Goal: Ability to manage health-related needs will improve Outcome: Progressing   Problem: Nutrition: Goal: Adequate nutrition will be maintained Outcome: Progressing   Problem: Coping: Goal: Level of anxiety will decrease Outcome: Progressing   

## 2023-06-17 NOTE — ED Provider Notes (Signed)
Attempted to see this patient, patient asleep, appears to be in no acute distress. Messaged patient RN, to inform me when patient wakes up.

## 2023-06-17 NOTE — ED Notes (Signed)
Patient got up to use the restroom and begin to stumble. This tech and [facility], NT transferred patient to wheelchair and took to the bathroom. This tech assisted patient in changing into purple scrubs. Patient stated "I need to get back to my bed. I feel another seizure coming". Tech took patient back to bed and both techs helped the patient back into bed. Patient then started to seize. RN and physician entered the room and assisted with patient.

## 2023-06-17 NOTE — ED Notes (Signed)
Pt assisted to BR. Upon returning to room she became agitated demanding her phone. Explained she cannot have her phone. She began removing monitor and trying to remove IV. Security present. After NT sitting with pt a few minutes and turning music on via computer, pt did calm down.

## 2023-06-17 NOTE — Progress Notes (Signed)
ATRIUM MONITORING

## 2023-06-17 NOTE — ED Provider Notes (Signed)
7:45 AM Appreciate consultation from [facility] Dr. [name] Eon who agrees to see and will facilitate admission to [facility].    8:00 AM Nurse notified that upon patient walking to the bathroom, she became quite unsteady and having seizure like tonic-clonic activities requiring assistance back to the bed.  It lasted for approximately 1 minute.  It was witnessed by staff.  We were able to evaluate patient at this time, and she was able to answer her name but then she exhibits some seizure-like activities with shaking and eye rolling lasting for approximately 10 seconds.  Currently plan is to avoid benzo while waiting for continuous EEG.  Unfortunately flow manager mention there are no bed available at [facility]  Therefore I will have patient transfer ER to ER to have a bed for continuous EEG.  Will also file IVC paper.  BP 105/65   Pulse 85   Temp 98.9 F (37.2 C)   Resp 11   Ht 5' (1.524 m)   Wt 81.6 kg   LMP 04/22/2023   SpO2 99%   BMI 35.13 kg/m   Results for orders placed or performed during the hospital encounter of 06/17/23  Comprehensive metabolic panel  Result Value Ref Range   Sodium 133 (L) 135 - 145 mmol/L   Potassium 3.3 (L) 3.5 - 5.1 mmol/L   Chloride 98 98 - 111 mmol/L   CO2 22 22 - 32 mmol/L   Glucose, Bld 96 70 - 99 mg/dL   BUN 8 6 - 20 mg/dL   Creatinine, Ser 4.09 0.44 - 1.00 mg/dL   Calcium 9.2 8.9 - 81.1 mg/dL   Total Protein 8.3 (H) 6.5 - 8.1 g/dL   Albumin 4.3 3.5 - 5.0 g/dL   AST 20 15 - 41 U/L   ALT 14 0 - 44 U/L   Alkaline Phosphatase 77 38 - 126 U/L   Total Bilirubin 0.6 0.3 - 1.2 mg/dL   GFR, Estimated >91 >47 mL/min   Anion gap 13 5 - 15  CBC with Differential/Platelet  Result Value Ref Range   WBC 12.8 (H) 4.0 - 10.5 K/uL   RBC 5.29 (H) 3.87 - 5.11 MIL/uL   Hemoglobin 8.8 (L) 12.0 - 15.0 g/dL   HCT 82.9 (L) 56.2 - 13.0 %   MCV 62.6 (L) 80.0 - 100.0 fL   MCH 16.6 (L) 26.0 - 34.0 pg   MCHC 26.6 (L) 30.0 - 36.0 g/dL   RDW 86.5 (H)  78.4 - 15.5 %   Platelets 404 (H) 150 - 400 K/uL   nRBC 0.0 0.0 - 0.2 %   Neutrophils Relative % 70 %   Neutro Abs 9.1 (H) 1.7 - 7.7 K/uL   Lymphocytes Relative 19 %   Lymphs Abs 2.4 0.7 - 4.0 K/uL   Monocytes Relative 6 %   Monocytes Absolute 0.7 0.1 - 1.0 K/uL   Eosinophils Relative 3 %   Eosinophils Absolute 0.3 0.0 - 0.5 K/uL   Basophils Relative 1 %   Basophils Absolute 0.1 0.0 - 0.1 K/uL   Immature Granulocytes 1 %   Abs Immature Granulocytes 0.09 (H) 0.00 - 0.07 K/uL   Ovalocytes PRESENT   hCG, serum, qualitative  Result Value Ref Range   Preg, Serum NEGATIVE NEGATIVE  Rapid urine drug screen (hospital performed)  Result Value Ref Range   Opiates NONE DETECTED NONE DETECTED   Cocaine POSITIVE (A) NONE DETECTED   Benzodiazepines POSITIVE (A) NONE DETECTED   Amphetamines NONE DETECTED NONE DETECTED  Tetrahydrocannabinol NONE DETECTED NONE DETECTED   Barbiturates NONE DETECTED NONE DETECTED  Magnesium  Result Value Ref Range   Magnesium 2.2 1.7 - 2.4 mg/dL  Ethanol  Result Value Ref Range   Alcohol, Ethyl (B) 50 (H) <10 mg/dL  CBG monitoring, ED  Result Value Ref Range   Glucose-Capillary 107 (H) 70 - 99 mg/dL   CT Head Wo Contrast  Result Date: 06/17/2023 CLINICAL DATA:  Seizure, new onset. EXAM: CT HEAD WITHOUT CONTRAST TECHNIQUE: Contiguous axial images were obtained from the base of the skull through the vertex without intravenous contrast. RADIATION DOSE REDUCTION: This exam was performed according to the departmental dose-optimization program which includes automated exposure control, adjustment of the mA and/or kV according to patient size and/or use of iterative reconstruction technique. COMPARISON:  Brain MRI 09/01/2022 FINDINGS: Brain: No evidence of acute infarction, hemorrhage, hydrocephalus, extra-axial collection or mass lesion/mass effect. Vascular: No hyperdense vessel or unexpected calcification. Skull: Normal. Negative for fracture or focal lesion.  Sinuses/Orbits: No acute finding. IMPRESSION: Normal head CT. Electronically Signed   By: Tiburcio Pea M.D.   On: 06/17/2023 06:28      Fayrene Helper, PA-C 06/17/23 0827    Laurence Spates, MD 06/18/23 [phone]

## 2023-06-17 NOTE — ED Triage Notes (Signed)
Pt BIB EMS from home with c/o seizures. 1 seizure pta of ems, 3 with ems. 10mg  versed IV total given. Hx of epilepsy, unknown what she takes for seizures.   120/90 110HR 100 NRB support  Cbg 172  20 LAC

## 2023-06-17 NOTE — H&P (Signed)
History and Physical  AMANDALEE WINTERHALTER UJW:[id number] DOB: 08/04/1991 DOA: 06/17/2023  PCP: Emogene Morgan, MD   Chief Complaint: Seizure-like activity  HPI: Faith [name] is a 32 y.o. female with medical history significant for cocaine abuse, depression, bipolar 1 disorder and seizure like activity as well as medication and follow-up noncompliance who is being admitted to the hospital with current seizure-like activity.  Patient was brought in by ambulance from home due to complaints of seizures early this morning.  She apparently complained of seizure x 1 prior to arrival EMS, she had 3 further seizures with EMS, she was given a total of 10 mg of Versed.  Blood sugar and vitals were essentially unremarkable.  She had another seizure-like activity here in the emergency department was given 2 mg IV Ativan.  Lab work and imaging as noted below are unremarkable.  ER provider discussed with Dr. Derry Lory of neurology who recommends the patient be transferred to [facility] for continuous EEG.  While in the emergency department, the patient ambulated with some assistance to the bathroom, on her way back she again had seizure-like activity with generalized tonic-clonic activity which lasted less than a minute.  Afterwards, the patient was intermittently speaking, but intermittently staring off into space and not answering questions.  My interview with the patient was somewhat limited as the patient is intermittently somnolent, but she was able to tell me her name, states that she is supposed to be on Vimpat and Keppra but does not take them as she is unable to afford the medications.  Review of Systems: Please see HPI for pertinent positives and negatives. A complete 10 system review of systems could not be performed due to the patient's condition.  Past Medical History:  Diagnosis Date   ADHD    Anemia    BLOOD TRANSFUSION AFTER C-SECTION ON 06-2016-2 UNITS   Anxiety    Asthma    WELL CONTROLLED    Bipolar 1 disorder, depressed (HCC)    COVID-19    Depression    Diabetes mellitus without complication (HCC)    Gallstones    GERD (gastroesophageal reflux disease)    Heart murmur    History of blood transfusion 06/2016   RECEIVED 2 UNITS OF BLOOD AFTER C SECTION   Migraines    Ovarian cyst    Seizures (HCC)    LAST SEIZURE January 2022   Tubal pregnancy    Past Surgical History:  Procedure Laterality Date   CESAREAN SECTION N/A 06/22/2016   Procedure: CESAREAN SECTION;  Surgeon: Vena Austria, MD;  Location: [facility] ORS;  Service: Obstetrics;  Laterality: N/A;   CESAREAN SECTION N/A 10/12/2017   Procedure: CESAREAN SECTION;  Surgeon: Vena Austria, MD;  Location: [facility] ORS;  Service: Obstetrics;  Laterality: N/A;   CESAREAN SECTION N/A 03/27/2021   Procedure: CESAREAN SECTION;  Surgeon: Natale Milch, MD;  Location: [facility] ORS;  Service: Obstetrics;  Laterality: N/A;   CHOLECYSTECTOMY N/A 08/13/2016   Procedure: LAPAROSCOPIC CHOLECYSTECTOMY;  Surgeon: Leafy Ro, MD;  Location: [facility] ORS;  Service: General;  Laterality: N/A;   DILATION AND CURETTAGE OF UTERUS     ovarian cyst removed     SALPINGECTOMY Right 2013   ectopic pregnancy. PER PATIENT, STILL HAS BOTH TUBES   TONSILLECTOMY     TONSILLECTOMY AND ADENOIDECTOMY      Social History:  reports that she has been smoking cigarettes. She has a 3.5 pack-year smoking history. She has never used smokeless tobacco. She reports  that she does not currently use alcohol.    Allergies  Allergen Reactions   Ivp Dye [Iodinated Contrast Media] Anaphylaxis    Per patient, she does not have problems with betadine   Latex Rash   Metrizamide Anaphylaxis   Morphine Other (See Comments)    bradycradia    Mushroom Extract Complex Anaphylaxis    Hives then swelling of throat and can not breath   Sulfasalazine Hives   Tomato Anaphylaxis   Toradol [Ketorolac Tromethamine] Anaphylaxis, Hives and Swelling   Aspirin Hives    Dilaudid [Hydromorphone Hcl] Hives   Lamictal [Lamotrigine] Hives   Sulfa Antibiotics Hives   Tramadol Swelling, Rash and Hives   Venofer [Iron Sucrose] Other (See Comments)    Chest pain, warm sensation, hypotension   Adhesive [Tape] Itching    USE PAPER TAPE   Iohexol Rash    (contrast dye) causes red bumps    Family History  Problem Relation Age of Onset   Heart disease Father    Huntington's disease Mother      Prior to Admission medications   Medication Sig Start Date End Date Taking? Authorizing Provider  Accu-Chek Softclix Lancets lancets 1 each by Other route 4 (four) times daily. 09/19/20   Conard Novak, MD  acetaminophen (TYLENOL) 500 MG tablet Take 1,000 mg by mouth every 6 (six) hours as needed (for pain).    [provider]  alprazolam Prudy Feeler) 2 MG tablet Take by mouth. 04/17/22   [provider]  amphetamine-dextroamphetamine (ADDERALL) 15 MG tablet Take 1 tablet by mouth 2 (two) times daily. 04/29/22   [provider]  DULoxetine (CYMBALTA) 30 MG capsule Take 30 mg by mouth daily. 04/29/22   [provider]  hydrocortisone (ANUSOL-HC) 25 MG suppository Place 1 suppository (25 mg total) rectally 2 (two) times daily. 04/01/21   Schuman, Jaquelyn Bitter, MD  ibuprofen (ADVIL) 600 MG tablet Take 600 mg by mouth every 6 (six) hours as needed. 03/18/22   [provider]  Lacosamide (VIMPAT) 100 MG TABS Take 1 tablet (100 mg total) by mouth in the morning and at bedtime. Take 100 mg twice daily for the first week.  You can then increase to 150 mg which would be 1.5 pills twice a day. 05/18/22 06/27/22  Georga Hacking, MD  Lacosamide (VIMPAT) 150 MG TABS TAKE 1 TABLET BY MOUTH TWICE DAILY FOR SEIZURE DISORDER Patient not taking: Reported on 05/19/2023 07/16/21   [provider]  PARoxetine (PAXIL) 20 MG tablet SMARTSIG:1 Tablet(s) By Mouth Morning-Evening 03/19/22   [provider]  Prenatal Vit-Fe Fumarate-FA (WESTAB  PLUS) 27-1 MG TABS Take by mouth. Patient not taking: Reported on 05/19/2023 03/24/22   [provider]  traZODone (DESYREL) 50 MG tablet TAKE 1 TABLET BY MOUTH EVERY NIGHT AT BEDTIME AS NEEDED FOR INSOMNIA. INCREASE TO 2 TABLET AS NEEDED AFTER 4-5 DAYS 07/14/21   [provider]    Physical Exam: BP 105/65   Pulse 85   Temp 98.9 F (37.2 C)   Resp 11   Ht 5' (1.524 m)   Wt 81.6 kg   LMP 04/22/2023   SpO2 99%   BMI 35.13 kg/m   General: Obese white female lying on a stretcher in the ER, somewhat somnolent/postictal Eyes: EOMI, clear conjuctivae, white sclerea Neck: supple, no masses, trachea mildline  Cardiovascular: RRR, no murmurs or rubs, no peripheral edema  Respiratory: clear to auscultation bilaterally, no wheezes, no crackles  Abdomen: soft, nontender, nondistended, normal bowel  tones heard  Skin: dry, no rashes  Musculoskeletal: no joint effusions, normal range of motion  Psychiatric: appropriate affect, normal speech  Neurologic: extraocular muscles intact, clear speech, moving all extremities with intact sensorium         Labs on Admission:  Basic Metabolic Panel: Recent Labs  Lab 06/17/23 0338  NA 133*  K 3.3*  CL 98  CO2 22  GLUCOSE 96  BUN 8  CREATININE 0.60  CALCIUM 9.2  MG 2.2   Liver Function Tests: Recent Labs  Lab 06/17/23 0338  AST 20  ALT 14  ALKPHOS 77  BILITOT 0.6  PROT 8.3*  ALBUMIN 4.3   No results for input(s): "LIPASE", "AMYLASE" in the last 168 hours. No results for input(s): "AMMONIA" in the last 168 hours. CBC: Recent Labs  Lab 06/17/23 0338  WBC 12.8*  NEUTROABS 9.1*  HGB 8.8*  HCT 33.1*  MCV 62.6*  PLT 404*   Cardiac Enzymes: No results for input(s): "CKTOTAL", "CKMB", "CKMBINDEX", "TROPONINI" in the last 168 hours.  BNP (last 3 results) No results for input(s): "BNP" in the last 8760 hours.  ProBNP (last 3 results) No results for input(s): "PROBNP" in the last 8760 hours.  CBG: Recent Labs   Lab 06/17/23 0336  GLUCAP 107*    Radiological Exams on Admission: CT Head Wo Contrast  Result Date: 06/17/2023 CLINICAL DATA:  Seizure, new onset. EXAM: CT HEAD WITHOUT CONTRAST TECHNIQUE: Contiguous axial images were obtained from the base of the skull through the vertex without intravenous contrast. RADIATION DOSE REDUCTION: This exam was performed according to the departmental dose-optimization program which includes automated exposure control, adjustment of the mA and/or kV according to patient size and/or use of iterative reconstruction technique. COMPARISON:  Brain MRI 09/01/2022 FINDINGS: Brain: No evidence of acute infarction, hemorrhage, hydrocephalus, extra-axial collection or mass lesion/mass effect. Vascular: No hyperdense vessel or unexpected calcification. Skull: Normal. Negative for fracture or focal lesion. Sinuses/Orbits: No acute finding. IMPRESSION: Normal head CT. Electronically Signed   By: Tiburcio Pea M.D.   On: 06/17/2023 06:28    Assessment/Plan WAYLYNN BALTES is a 32 y.o. female with medical history significant for cocaine abuse, depression, bipolar 1 disorder and seizure like activity as well as medication and follow-up noncompliance who is being admitted to the hospital with current seizure-like activity.   Seizure-like activity-patient has a long history of this, has been on Keppra, Vimpat in the past.  She is followed by neurology at [facility].  She had unremarkable MRI of the brain 09/01/2022.  Most recent neurology notes indicate that she has no showed/canceled multiple times when sleep deprived EEG was scheduled. -Inpatient admission -Seizure precautions -Continue IV Keppra -Continuous EEG at [facility] -Anticipate formal neurology consultation upon arrival  Suicidal ideation-patient was witnessed on the phone having an argument with somebody, during which time she stated that she would just kill herself.  Afterwards, she had seizure-like  activity. -Suicide precautions -IVC -Anticipate psychiatry evaluation once medically cleared  Cocaine abuse-this is an ongoing problem  Leukocytosis-no signs or symptoms of acute illness, could be reactive due to seizure activity or cocaine abuse  Chronic anemia-appears to be essentially at baseline  DVT prophylaxis: Lovenox     Code Status: Full Code  Consults called: Neurology  Admission status: The appropriate patient status for this patient is INPATIENT. Inpatient status is judged to be reasonable and necessary in order to provide the required intensity of service to ensure the patient's safety. The patient's presenting symptoms,  physical exam findings, and initial radiographic and laboratory data in the context of their chronic comorbidities is felt to place them at high risk for further clinical deterioration. Furthermore, it is not anticipated that the patient will be medically stable for discharge from the hospital within 2 midnights of admission.    I certify that at the point of admission it is my clinical judgment that the patient will require inpatient hospital care spanning beyond 2 midnights from the point of admission due to high intensity of service, high risk for further deterioration and high frequency of surveillance required  Time spent: 56 minutes  Chizaram Latino Sharlette Dense MD [facility] Pager [phone]  If 7PM-7AM, please contact night-coverage [email or web address] Password [facility]  06/17/2023, 8:06 AM

## 2023-06-17 NOTE — ED Notes (Signed)
ED TO INPATIENT HANDOFF REPORT  Name/Age/Gender Faith [name] 32 y.o. female  Code Status    Code Status Orders  (From admission, onward)           Start     Ordered   06/17/23 0753  Full code  Continuous       Question:  By:  Answer:  Consent: discussion documented in EHR   06/17/23 0753           Code Status History     Date Active Date Inactive Code Status Order ID Comments User Context   03/27/2021 1339 03/29/2021 1722 Full Code [id number]  Natale Milch, MD Inpatient   02/19/2021 0025 02/19/2021 0755 Full Code [id number]  Nadara Mustard, MD Inpatient   01/26/2021 1732 01/26/2021 2355 Full Code [id number]  Nadara Mustard, MD Inpatient   10/12/2017 1346 10/14/2017 2011 Full Code [id number]  Vena Austria, MD Inpatient   09/23/2017 0439 09/23/2017 0839 Full Code [id number]  Tresea Mall, CNM Inpatient   07/30/2017 1559 07/30/2017 2110 Full Code [id number]  Farrel Conners, CNM Inpatient   06/02/2017 2351 06/03/2017 0428 Full Code [id number]  Conard Novak, MD Inpatient   06/22/2016 2016 06/25/2016 1407 Full Code [id number]  Vena Austria, MD Inpatient   06/20/2016 0105 06/22/2016 1951 Full Code [id number]  Farrel Conners, CNM Inpatient   04/07/2016 2312 04/08/2016 0338 Full Code [id number]  Nadara Mustard, MD Inpatient   03/15/2016 2158 03/16/2016 0239 Full Code [id number]  Farrel Conners, CNM Inpatient   07/14/2013 0518 07/14/2013 1527 Full Code [id number]  Glynn Octave, MD ED   06/14/2013 0201 06/15/2013 2330 Full Code [id number]  Vania Rea, MD Inpatient       Home/SNF/Other Home  Chief Complaint Seizure-like activity [facility]) [R56.9]  Level of Care/Admitting Diagnosis ED Disposition     ED Disposition  Admit   Condition  --   Comment  Hospital Area: [facility] [[id number]]  Level of Care: Med-Surg [16]  May admit patient to [facility] or [facility] if equivalent level of care is available:: No  Covid Evaluation:  Asymptomatic - no recent exposure (last 10 days) testing not required  Diagnosis: Seizure-like activity [facility]) [[id number]]  Admitting Physician: Maryln Gottron [[id number]]  Attending Physician: Kirby Crigler, MIR [place] [[id number]]  Certification:: I certify this patient will need inpatient services for at least 2 midnights  Expected Medical Readiness: 06/20/2023          Medical History Past Medical History:  Diagnosis Date   ADHD    Anemia    BLOOD TRANSFUSION AFTER C-SECTION ON 06-2016-2 UNITS   Anxiety    Asthma    WELL CONTROLLED   Bipolar 1 disorder, depressed (HCC)    COVID-19    Depression    Diabetes mellitus without complication (HCC)    Gallstones    GERD (gastroesophageal reflux disease)    Heart murmur    History of blood transfusion 06/2016   RECEIVED 2 UNITS OF BLOOD AFTER C SECTION   Migraines    Ovarian cyst    Seizures (HCC)    LAST SEIZURE January 2022   Tubal pregnancy     Allergies Allergies  Allergen Reactions   Ivp Dye [Iodinated Contrast Media] Anaphylaxis    Per patient, she does not have problems with betadine   Latex Rash   Metrizamide Anaphylaxis   Morphine Other (See Comments)    bradycradia    Mushroom Extract Complex Anaphylaxis    Hives then swelling of  throat and can not breath   Sulfasalazine Hives   Tomato Anaphylaxis   Toradol [Ketorolac Tromethamine] Anaphylaxis, Hives and Swelling   Aspirin Hives   Dilaudid [Hydromorphone Hcl] Hives   Lamictal [Lamotrigine] Hives   Sulfa Antibiotics Hives   Tramadol Swelling, Rash and Hives   Venofer [Iron Sucrose] Other (See Comments)    Chest pain, warm sensation, hypotension   Adhesive [Tape] Itching    USE PAPER TAPE   Iohexol Rash    (contrast dye) causes red bumps    IV Location/Drains/Wounds Patient Lines/Drains/Airways Status     Active Line/Drains/Airways     Name Placement date Placement time Site Days   Peripheral IV 06/17/23 20 G Left Antecubital 06/17/23  0339  Antecubital   less than 1            Labs/Imaging Results for orders placed or performed during the hospital encounter of 06/17/23 (from the past 48 hour(s))  CBG monitoring, ED     Status: Abnormal   Collection Time: 06/17/23  3:36 AM  Result Value Ref Range   Glucose-Capillary 107 (H) 70 - 99 mg/dL    Comment: Glucose reference range applies only to samples taken after fasting for at least 8 hours.  Comprehensive metabolic panel     Status: Abnormal   Collection Time: 06/17/23  3:38 AM  Result Value Ref Range   Sodium 133 (L) 135 - 145 mmol/L   Potassium 3.3 (L) 3.5 - 5.1 mmol/L   Chloride 98 98 - 111 mmol/L   CO2 22 22 - 32 mmol/L   Glucose, Bld 96 70 - 99 mg/dL    Comment: Glucose reference range applies only to samples taken after fasting for at least 8 hours.   BUN 8 6 - 20 mg/dL   Creatinine, Ser 1.61 0.44 - 1.00 mg/dL   Calcium 9.2 8.9 - 09.6 mg/dL   Total Protein 8.3 (H) 6.5 - 8.1 g/dL   Albumin 4.3 3.5 - 5.0 g/dL   AST 20 15 - 41 U/L   ALT 14 0 - 44 U/L   Alkaline Phosphatase 77 38 - 126 U/L   Total Bilirubin 0.6 0.3 - 1.2 mg/dL   GFR, Estimated >04 >54 mL/min    Comment: (NOTE) Calculated using the CKD-EPI Creatinine Equation (2021)    Anion gap 13 5 - 15    Comment: Performed at [facility], [address] [place], [place] [address]  CBC with Differential/Platelet     Status: Abnormal   Collection Time: 06/17/23  3:38 AM  Result Value Ref Range   WBC 12.8 (H) 4.0 - 10.5 K/uL   RBC 5.29 (H) 3.87 - 5.11 MIL/uL   Hemoglobin 8.8 (L) 12.0 - 15.0 g/dL    Comment: Reticulocyte Hemoglobin testing may be clinically indicated, consider ordering this additional test [id number]    HCT 33.1 (L) 36.0 - 46.0 %   MCV 62.6 (L) 80.0 - 100.0 fL   MCH 16.6 (L) 26.0 - 34.0 pg   MCHC 26.6 (L) 30.0 - 36.0 g/dL   RDW 62.1 (H) 30.8 - 65.7 %   Platelets 404 (H) 150 - 400 K/uL    Comment: REPEATED TO VERIFY   nRBC 0.0 0.0 - 0.2 %   Neutrophils Relative % 70 %   Neutro  Abs 9.1 (H) 1.7 - 7.7 K/uL   Lymphocytes Relative 19 %   Lymphs Abs 2.4 0.7 - 4.0 K/uL   Monocytes Relative 6 %   Monocytes Absolute 0.7  0.1 - 1.0 K/uL   Eosinophils Relative 3 %   Eosinophils Absolute 0.3 0.0 - 0.5 K/uL   Basophils Relative 1 %   Basophils Absolute 0.1 0.0 - 0.1 K/uL   Immature Granulocytes 1 %   Abs Immature Granulocytes 0.09 (H) 0.00 - 0.07 K/uL   Ovalocytes PRESENT     Comment: Performed at [facility], [address], [place], [place] [address]  hCG, serum, qualitative     Status: None   Collection Time: 06/17/23  3:38 AM  Result Value Ref Range   Preg, Serum NEGATIVE NEGATIVE    Comment:        THE SENSITIVITY OF THIS METHODOLOGY IS >10 mIU/mL. Performed at [facility], [address], [place], [place] [address]   Magnesium     Status: None   Collection Time: 06/17/23  3:38 AM  Result Value Ref Range   Magnesium 2.2 1.7 - 2.4 mg/dL    Comment: Performed at [facility], [address], [place], [place] [address]  Rapid urine drug screen (hospital performed)     Status: Abnormal   Collection Time: 06/17/23  4:50 AM  Result Value Ref Range   Opiates NONE DETECTED NONE DETECTED   Cocaine POSITIVE (A) NONE DETECTED   Benzodiazepines POSITIVE (A) NONE DETECTED   Amphetamines NONE DETECTED NONE DETECTED   Tetrahydrocannabinol NONE DETECTED NONE DETECTED   Barbiturates NONE DETECTED NONE DETECTED    Comment: (NOTE) DRUG SCREEN FOR MEDICAL PURPOSES ONLY.  IF CONFIRMATION IS NEEDED FOR ANY PURPOSE, NOTIFY LAB WITHIN 5 DAYS.  LOWEST DETECTABLE LIMITS FOR URINE DRUG SCREEN Drug Class                     Cutoff (ng/mL) Amphetamine and metabolites    1000 Barbiturate and metabolites    200 Benzodiazepine                 200 Opiates and metabolites        300 Cocaine and metabolites        300 THC                            50 Performed at [facility], [address], [place], [place] [address]   Ethanol     Status: Abnormal   Collection Time: 06/17/23  4:50 AM  Result Value Ref Range   Alcohol, Ethyl (B) 50 (H) <10 mg/dL    Comment: (NOTE) Lowest detectable limit for serum alcohol is 10 mg/dL.  For medical purposes only. Performed at [facility], [address] [place], [place] [address]    CT Head Wo Contrast  Result Date: 06/17/2023 CLINICAL DATA:  Seizure, new onset. EXAM: CT HEAD WITHOUT CONTRAST TECHNIQUE: Contiguous axial images were obtained from the base of the skull through the vertex without intravenous contrast. RADIATION DOSE REDUCTION: This exam was performed according to the departmental dose-optimization program which includes automated exposure control, adjustment of the mA and/or kV according to patient size and/or use of iterative reconstruction technique. COMPARISON:  Brain MRI 09/01/2022 FINDINGS: Brain: No evidence of acute infarction, hemorrhage, hydrocephalus, extra-axial collection or mass lesion/mass effect. Vascular: No hyperdense vessel or unexpected calcification. Skull: Normal. Negative for fracture or focal lesion. Sinuses/Orbits: No acute finding. IMPRESSION: Normal head CT. Electronically Signed   By: Tiburcio Pea M.D.   On: 06/17/2023 06:28    Pending  Labs [employer] (From admission, onward)     Start     Ordered   06/18/23 0500  Basic metabolic panel  Tomorrow morning,   R        06/17/23 0753   06/18/23 0500  CBC  Tomorrow morning,   R        06/17/23 0753   06/17/23 0753  HIV Antibody (routine testing w rflx)  (HIV Antibody (Routine testing w reflex) panel)  Once,   R        06/17/23 0753            Vitals/Pain Today's Vitals   06/17/23 0338 06/17/23 0430 06/17/23 0530 06/17/23 0700  BP: 126/83 (!) 146/73 117/73 105/65  Pulse: (!) 114 (!) 104 92 85  Resp: 20 20 16 11   Temp: 98.9 F (37.2 C)     SpO2: 98% 96% 97% 99%  Weight:      Height:        Isolation  Precautions No active isolations  Medications Medications  enoxaparin (LOVENOX) injection 40 mg (has no administration in time range)  acetaminophen (TYLENOL) tablet 650 mg (has no administration in time range)    Or  acetaminophen (TYLENOL) suppository 650 mg (has no administration in time range)  traZODone (DESYREL) tablet 25 mg (has no administration in time range)  ondansetron (ZOFRAN) tablet 4 mg (has no administration in time range)    Or  ondansetron (ZOFRAN) injection 4 mg (has no administration in time range)  albuterol (PROVENTIL) (2.5 MG/3ML) 0.083% nebulizer solution 2.5 mg (has no administration in time range)  levETIRAcetam (KEPPRA) IVPB 1500 mg/ 100 mL premix (0 mg Intravenous Stopped 06/17/23 0445)  ammonia inhalant (  Given by Other 06/17/23 0432)  LORazepam (ATIVAN) injection 2 mg (2 mg Intravenous Given 06/17/23 0433)    Mobility walks with person assist   Patient is IVC'd for SI Patient is A&Ox2, responds to verbal stimuli, PERRLA, intermittently follows commands.  On RA, VSS

## 2023-06-18 ENCOUNTER — Encounter: Payer: Self-pay | Admitting: Internal Medicine

## 2023-06-18 ENCOUNTER — Encounter (HOSPITAL_COMMUNITY): Payer: Self-pay | Admitting: Internal Medicine

## 2023-06-18 ENCOUNTER — Other Ambulatory Visit (HOSPITAL_COMMUNITY): Payer: Self-pay

## 2023-06-18 DIAGNOSIS — F4321 Adjustment disorder with depressed mood: Secondary | ICD-10-CM

## 2023-06-18 DIAGNOSIS — F1520 Other stimulant dependence, uncomplicated: Secondary | ICD-10-CM | POA: Diagnosis present

## 2023-06-18 DIAGNOSIS — F3170 Bipolar disorder, currently in remission, most recent episode unspecified: Secondary | ICD-10-CM

## 2023-06-18 DIAGNOSIS — R569 Unspecified convulsions: Secondary | ICD-10-CM | POA: Diagnosis not present

## 2023-06-18 DIAGNOSIS — F445 Conversion disorder with seizures or convulsions: Secondary | ICD-10-CM | POA: Diagnosis not present

## 2023-06-18 DIAGNOSIS — F1994 Other psychoactive substance use, unspecified with psychoactive substance-induced mood disorder: Secondary | ICD-10-CM

## 2023-06-18 HISTORY — DX: Adjustment disorder with depressed mood: F43.21

## 2023-06-18 HISTORY — DX: Other psychoactive substance use, unspecified with psychoactive substance-induced mood disorder: F19.94

## 2023-06-18 HISTORY — DX: Other stimulant dependence, uncomplicated: F15.20

## 2023-06-18 LAB — CBC
HCT: 29.7 % — ABNORMAL LOW (ref 36.0–46.0)
Hemoglobin: 7.9 g/dL — ABNORMAL LOW (ref 12.0–15.0)
MCH: 16.8 pg — ABNORMAL LOW (ref 26.0–34.0)
MCHC: 26.6 g/dL — ABNORMAL LOW (ref 30.0–36.0)
MCV: 63.2 fL — ABNORMAL LOW (ref 80.0–100.0)
Platelets: 355 10*3/uL (ref 150–400)
RBC: 4.7 MIL/uL (ref 3.87–5.11)
RDW: 19.4 % — ABNORMAL HIGH (ref 11.5–15.5)
WBC: 11 10*3/uL — ABNORMAL HIGH (ref 4.0–10.5)
nRBC: 0 % (ref 0.0–0.2)

## 2023-06-18 LAB — BASIC METABOLIC PANEL
Anion gap: 10 (ref 5–15)
BUN: 10 mg/dL (ref 6–20)
CO2: 23 mmol/L (ref 22–32)
Calcium: 8.7 mg/dL — ABNORMAL LOW (ref 8.9–10.3)
Chloride: 106 mmol/L (ref 98–111)
Creatinine, Ser: 0.6 mg/dL (ref 0.44–1.00)
GFR, Estimated: >60 mL/min (ref >60)
Glucose, Bld: 104 mg/dL — ABNORMAL HIGH (ref 70–99)
Potassium: 3.9 mmol/L (ref 3.5–5.1)
Sodium: 139 mmol/L (ref 135–145)

## 2023-06-18 LAB — URINALYSIS, ROUTINE W REFLEX MICROSCOPIC
Bacteria, UA: NONE SEEN
Bilirubin Urine: NEGATIVE
Glucose, UA: NEGATIVE mg/dL
Hgb urine dipstick: NEGATIVE
Ketones, ur: NEGATIVE mg/dL
Nitrite: NEGATIVE
Protein, ur: NEGATIVE mg/dL
Specific Gravity, Urine: 1.023 (ref 1.005–1.030)
pH: 5 (ref 5.0–8.0)

## 2023-06-18 LAB — MAGNESIUM: Magnesium: 2.3 mg/dL (ref 1.7–2.4)

## 2023-06-18 MED ORDER — CEPHALEXIN 500 MG PO CAPS
500.0000 mg | ORAL_CAPSULE | Freq: Three times a day (TID) | ORAL | Status: DC
  Administered 2023-06-18: 500 mg via ORAL
  Filled 2023-06-18: qty 1

## 2023-06-18 MED ORDER — LORAZEPAM 2 MG/ML IJ SOLN: 1.0000 mg | INTRAMUSCULAR | Status: DC | PRN

## 2023-06-18 MED ORDER — CEPHALEXIN 500 MG PO CAPS
500.0000 mg | ORAL_CAPSULE | Freq: Three times a day (TID) | ORAL | 0 refills | Status: DC
  Filled 2023-06-18: qty 9, 3d supply, fill #0

## 2023-06-18 NOTE — Progress Notes (Signed)
Neurology Progress Note  Brief HPI: 32 year old patient with history of ADHD, anxiety, asthma, bipolar disorder, depression, diabetes, GERD, heart murmur, migraines and seizures presented with seizure.  Patient had 1 seizure at home as well as 3 seizures en route to the hospital with EMS and was given Versed IV.  She has had significant psychosocial stressors recently.  She has been unable to take her medications lately due to cost, but states she will be able to obtain them upon discharge.  UDS was positive for cocaine.  Patient reports that she has had a serious head injury in the past, has no history of meningitis or encephalitis and has 1 biological brother with seizures.  She states that during her seizure episodes, she often has warning of feeling flushed and/or seeing black spots, that she has no awareness during seizure activity and that she often feels tired afterwards.  She has been incontinent of urine during episodes but denies biting her tongue.  Subjective: Patient reports that she did have a seizure episode overnight while connected to the long-term EEG but is unable to state what time it happened.  She states she would like to go home today.  Exam: Vitals:   06/17/23 2305 06/18/23 0728  BP: 111/61 110/72  Pulse: 92 88  Resp: 18 17  Temp: 98.1 F (36.7 C) 98 F (36.7 C)  SpO2: 98% 100%   Gen: In bed, NAD Resp: non-labored breathing, no acute distress Abd: soft, nt  Neuro: Mental Status: Alert and oriented to person place time and situation Cranial Nerves: Pupils equal round and reactive to light, extraocular movements intact, facial sensation symmetrical except for slight deficit on V1 on the right, face symmetrical, shoulder shrug symmetrical, hearing intact to voice, phonation normal, tongue midline Motor: Able to move all 4 extremities symmetrically with good antigravity strength Sensory: Intact to light touch throughout Cerebellar: Finger-to-nose intact  bilaterally Gait: Deferred  Pertinent Labs:    Latest Ref Rng & Units 06/18/2023    6:09 AM 06/17/2023    3:38 AM 10/23/2022   11:15 AM  CBC  WBC 4.0 - 10.5 K/uL 11.0  12.8  9.1   Hemoglobin 12.0 - 15.0 g/dL 7.9  8.8  8.5   Hematocrit 36.0 - 46.0 % 29.7  33.1  30.5   Platelets 150 - 400 K/uL 355  404  443        Latest Ref Rng & Units 06/18/2023    6:09 AM 06/17/2023    3:38 AM 05/18/2022    5:12 PM  BMP  Glucose 70 - 99 mg/dL 409  96  92   BUN 6 - 20 mg/dL 10  8  13    Creatinine 0.44 - 1.00 mg/dL 8.11  9.14  7.82   Sodium 135 - 145 mmol/L 139  133  136   Potassium 3.5 - 5.1 mmol/L 3.9  3.3  4.8   Chloride 98 - 111 mmol/L 106  98  107   CO2 22 - 32 mmol/L 23  22  21    Calcium 8.9 - 10.3 mg/dL 8.7  9.2  8.6      Imaging Reviewed:  CT head: No acute abnormality  EEG 9/13: Pending  Assessment: 32 year old patient with history of ADHD, anxiety, asthma, bipolar disorder, depression, diabetes, GERD, heart murmur, migraines and seizures presented with seizure-like activity at home followed by 3 seizure-like episodes with EMS.  She was given Versed for the seizures and was loaded on Keppra but is not currently on any  AEDs.  She has been unable to take her AEDs at home due to cost, and UDS was positive for cocaine.  She has been on long-term EEG and did state that she had an episode last night but could not tell when.  Patient has had increased psychosocial stressors recently, and it is possible that some of these episodes are PNES.  However, it is certainly possible for patient to have both epileptic seizures and PNES.  Had EEG on the other hand captured multiple events-all of them nonepileptic.  Impression: Psychogenic nonepileptic spells.  Recommendations: Discontinue LTM EEG No further inpatient or outpatient neurological workup indicated at this time. She needs continuing psychiatric care both inpatient and outpatient. Plan was discussed with the psychiatry team and the  primary-Dr. Thedore Mins over secure chat. Please call with questions as needed.  Seizure precautions  Cortney E Ernestina Columbia , MSN, AGACNP-BC [facility] See Amion for schedule and pager information 06/18/2023 9:20 AM    Attending addendum Please see the detailed consultative note from Dr. Derry Lory. Long-term EEG with multiple episodes concerning for seizure captured with no concomitant EEG change. 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 There is no indication for antiepileptic treatment. Psychiatry service wanting to know if she will go on antiepileptics-clearly indicated that antiepileptics are not indicated since this is not epilepsy but if the need to use it for mood stabilization, that should be fine. No further inpatient or outpatient neurology workup needed at this time for this patient. Plan was relayed to Dr. Thedore Mins.  -- Milon Dikes, MD Neurologist [facility] Pager: [phone]   SEIZURE PRECAUTIONS per state law although diagnosis is PNES Per [facility] statutes, patients with seizures are not allowed to drive until they have been seizure-free for six months.   Use caution when using heavy equipment or power tools. Avoid working on ladders or at heights. Take showers instead of baths. Ensure the water temperature is not too high on the home water heater. Do not go swimming alone. Do not lock yourself in a room alone (i.e. bathroom). When caring for infants or small children, sit down when holding, feeding, or changing them to minimize risk of injury to the child in the event you have a seizure. Maintain good sleep hygiene. Avoid alcohol.    If patient has another seizure, call 911 and bring them back to the ED if: A.  The seizure lasts longer than 5 minutes.      B.  The patient doesn't wake shortly after the seizure or has new problems such as difficulty seeing, speaking or  moving following the seizure C.  The patient was injured during the seizure D.  The patient has a temperature over 102 F (39C) E.  The patient vomited during the seizure and now is having trouble breathing

## 2023-06-18 NOTE — Progress Notes (Signed)
Maint done, no skin breakdown

## 2023-06-18 NOTE — Discharge Summary (Signed)
Faith [name] WGN:[id number] DOB: 1991-09-06 DOA: 06/17/2023  PCP: Emogene Morgan, MD  Admit date: 06/17/2023  Discharge date: 06/18/2023  Admitted From: Home   Disposition:  Home   Recommendations for Outpatient Follow-up:   Follow up with PCP in 1-2 weeks  PCP Please obtain BMP/CBC, 2 view CXR in 1week,  (see Discharge instructions)   PCP Please follow up on the following pending results:    Home Health: None   Equipment/Devices: None  Consultations: Neuro, Psych Discharge Condition: Stable    CODE STATUS: Full    Diet Recommendation: Heart Healthy    Chief Complaint  Patient presents with   Seizures     Brief history of present illness from the day of admission and additional interim summary    32 y.o. female with medical history significant for cocaine abuse, depression, bipolar 1 disorder and seizure like activity as well as medication and follow-up noncompliance who is being admitted to the hospital with current seizure-like activity.  He is on couple of antiseizure medications apparently has not been able to take it due to financial issues.  She was admitted to the hospital for breakthrough seizures, polysubstance abuse and some suicidal ideation which was expressed by her over the phone.                                                                  Hospital Course   Seizure-like activity in a patient with history of seizures.  Noncompliant with Keppra and Vimpat, follows with [facility], compliance with medications and neurology follow-ups outpatient, recent MRI unremarkable on 09/01/2022.  Long-term EEG -ve with multiple ?? Episodes while on EEG ( psychogenic), neurology was on board, DC home per Neuro, no AEDs per Neurology neurology.  Continue seizure precautions.  Counseled on compliance and  follow-ups.   Suicidal ideation expressed by her over the phone in the ER. Cleared for DC per Psych.   Polysubstance abuse including cocaine.  Counseled to quit.  Avoid beta-blockers.   Chronic anemia.  Stable follow-up with PCP.     UTI.  Keflex x 3 days   Discharge diagnosis     Principal Problem:   Seizure-like activity (HCC) Active Problems:   Bipolar disorder (HCC)   Acute adjustment disorder with depressed mood   Substance induced mood disorder (HCC)   Severe stimulant use disorder [facility])    Discharge instructions    Discharge Instructions     Discharge instructions   Complete by: As directed    Do not drive, operate heavy machinery, perform activities at heights, swimming or participation in water activities or provide baby sitting services until you have seen by Primary MD or a Neurologist and advised to do so again.  Follow with your PCP in 1 week and your Neurologist, Psychiatrist  in 7 days   Get CBC, CMP, Magnesium, UA, 2 view Chest X ray -  checked next visit with your primary MD    Activity: As tolerated with Full fall precautions use walker/cane & assistance as needed  Disposition Home   Diet: Heart Healthy    Special Instructions: If you have smoked or chewed Tobacco  in the last 2 yrs please stop smoking, stop any regular Alcohol  and or any Recreational drug use.  On your next visit with your primary care physician please Get Medicines reviewed and adjusted.  Please request your Prim.MD to go over all Hospital Tests and Procedure/Radiological results at the follow up, please get all Hospital records sent to your Prim MD by signing hospital release before you go home.  If you experience worsening of your admission symptoms, develop shortness of breath, life threatening emergency, suicidal or homicidal thoughts you must seek medical attention immediately by calling 911 or calling your MD immediately  if symptoms less severe.  You Must read complete  instructions/literature along with all the possible adverse reactions/side effects for all the Medicines you take and that have been prescribed to you. Take any new Medicines after you have completely understood and accpet all the possible adverse reactions/side effects.   Do not drive when taking Pain medications.  Do not take more than prescribed Pain, Sleep and Anxiety Medications  Wear Seat belts while driving.   Please note  You were cared for by a hospitalist during your hospital stay. If you have any questions about your discharge medications or the care you received while you were in the hospital after you are discharged, you can call the unit and asked to speak with the hospitalist on call if the hospitalist that took care of you is not available. Once you are discharged, your primary care physician will handle any further medical issues. Please note that NO REFILLS for any discharge medications will be authorized once you are discharged, as it is imperative that you return to your primary care physician (or establish a relationship with a primary care physician if you do not have one) for your aftercare needs so that they can reassess your need for medications and monitor your lab values.    You should also follow-up with your primary care doctor, or start seeing one if you don't have one yet. If applicable, here are some scheduled follow-ups for you: [name] Su, Tuesday (06/22/2023) at 2pm   I recommend abstinence from alcohol, tobacco, and other illicit drug use.   If your psychiatric symptoms or suicidal thoughts recur, worsen, or if you have side effects to your psychiatric medications, call your outpatient psychiatric provider, 911, [phone] or go to the nearest emergency department.  Take care!  Signed: [name], MD 06/18/2023, 1:12 PM  For a list of more resources, see the following:  [facility] [address] [place], [place], [address] [phone]  phone  New Patient Assessment/Therapy Walk-Ins:  Monday and Wednesday: 8 am until slots are full. Every 1st and 2nd Fridays of the month: 1 pm - 5 pm.  NO ASSESSMENT/THERAPY WALK-INS ON TUESDAYS OR THURSDAYS  New Patient Assessment/Medication Management Walk-Ins:  Monday - Friday:  8 am - 11 am.  For all walk-ins, we ask that you arrive by 7:30 am because patients will be seen in the order of arrival.  Availability is limited; therefore, you may not be seen on the same day that you walk-in.  Our goal is  to serve and meet the needs of our community to the best of our ability.  12 STEP PROGRAMS:  Alcoholics Anonymous of [place] SoftwareChalet.be  Narcotics Anonymous of [place] HitProtect.dk  Al-Anon of [place], [place] www.[place]alanon.org/find-meetings.html  Nar-Anon https://nar-anon.org/find-a-meeting  Naloxone (Narcan) can help reverse an overdose when given to the victim quickly.  [place] offers free naloxone kits and instructions/training on its use.  Add naloxone to your first aid kit and you can help save a life. A prescription can be filled at your local pharmacy or free kits are provided by the county.  Pick up your free kit at the following locations:   [place]:  [facility], [address] [place] [address] [phone]) [facility] [address] [place] [address] [phone]) [facility] [address] [place] [address]  [place]: [facility] [address] [place] [address] ([phone]) [facility] [address] [place] [address] [phone])       Discharge Medications   Allergies as of 06/18/2023       Reactions   Amipaque [metrizamide] Anaphylaxis   Azulfidine [sulfasalazine] Hives   Ivp Dye [iodinated Contrast  Media] Anaphylaxis   Per patient, she does not have problems with betadine   Latex Rash   Ms Contin [morphine] Other (See Comments)   Bradycardia   Mushroom Extract Complex Anaphylaxis, Hives, Shortness Of Breath, Swelling, Other (See Comments)   Throat swelling   Neurontin [gabapentin] Hives   Tomato Anaphylaxis   Toradol [ketorolac Tromethamine] Anaphylaxis, Hives, Swelling   Asa [aspirin] Hives   Dilaudid [hydromorphone Hcl] Hives   Lamictal [lamotrigine] Hives   Sulfa Antibiotics Hives   Ultram [tramadol] Hives, Swelling, Rash   Venofer [iron Sucrose] Other (See Comments)   Chest pain Warm sensation Hypotension    Adhesive [tape] Itching   Paper tape OK   Iohexol Rash        Medication List     TAKE these medications    alprazolam 2 MG tablet Commonly known as: XANAX Take 2 mg by mouth 4 (four) times daily.   amphetamine-dextroamphetamine 20 MG tablet Commonly known as: ADDERALL Take 20 mg by mouth 2 (two) times daily.   cephALEXin 500 MG capsule Commonly known as: KEFLEX Take 1 capsule (500 mg total) by mouth every 8 (eight) hours.   dicyclomine 20 MG tablet Commonly known as: BENTYL Take 20 mg by mouth every 8 (eight) hours as needed for spasms.   DULoxetine 30 MG capsule Commonly known as: CYMBALTA Take 30 mg by mouth daily.   ibuprofen 600 MG tablet Commonly known as: ADVIL Take 600 mg by mouth every 6 (six) hours as needed for headache or moderate pain.   pantoprazole 40 MG tablet Commonly known as: PROTONIX Take 40 mg by mouth daily.   traZODone 50 MG tablet Commonly known as: DESYREL Take 100 mg by mouth at bedtime as needed for sleep.         Follow-up Information     Aycock, Ngwe A, MD. Schedule an appointment as soon as possible for a visit in 1 week(s).   Specialty: Family Medicine Why: and your Psychiatrist & Neurologist within 1 week Contact information: [address] [place] [address] [phone]                  Major procedures and Radiology Reports -  PLEASE review detailed and final reports thoroughly  -       Overnight EEG with video  Result Date: 06/18/2023 Charlsie Quest, MD     06/18/2023 10:19 AM Patient Name: Faith [name] MRN: [id number] Epilepsy Attending: Charlsie Quest Referring Physician/Provider: Charlsie Quest, MD Duration: 06/17/2023 1845   to 06/18/2023 1000 Patient history: 32 y.o. female with a pertinent medical history of ADHD, anxiety, asthma, bipolar 1, depression, diabetes, GERD, heart murmur, migraines, seizures presenting with seizure. EEG to evaluate for seizure Level of alertness: Awake,asleep AEDs during EEG study: None Technical aspects: This EEG study was done with scalp electrodes positioned according to the 10-20 International system of electrode placement. Electrical activity was reviewed with band pass filter of 1-70Hz , sensitivity of 7 uV/mm, display speed of 107mm/sec with a 60Hz  notched filter applied as appropriate. EEG data were recorded continuously and digitally stored.  Video monitoring was available and reviewed as appropriate. Description: The posterior dominant rhythm consists of 9 Hz activity of moderate voltage (25-35 uV) seen predominantly in posterior head regions, symmetric and reactive to eye opening and eye closing. Sleep was characterized by vertex waves, sleep spindles (12 to 14 Hz), maximal frontocentral region.  Event button was pressed on 06/17/2023 at 1949 for twitching of right arm and leg.  Concomitant EEG before, during and after the event did not show any EEG due to severe seizure. Event button was pressed on 06/17/2023 at 2004 and 2103 for head jerking. Concomitant EEG before, during and after the event did not show any EEG to suggest seizure. Event button was pressed on 06/17/2023 at 2013,  2106, 2319, 2334 and on 06/18/2023 at 0147, 0156, 0218, 8469,6295  for generalized shaking.  Concomitant EEG before, during and after the events did not  show any EEG to suggest seizure. Event button was pressed on 07/05/2023 at 0204.  Patient reported her mouth was feeling weird.  Concomitant EEG before, during and after the event did not show any EEG changes suggest seizure. Hyperventilation and photic stimulation were not performed.   IMPRESSION: This study is within normal limits. No seizures or epileptiform discharges were seen throughout the recording. Multiple events were recorded as described above without concomitant EEG change.   These events were NON-EPILEPTIC. Charlsie Quest   CT Head Wo Contrast  Result Date: 06/17/2023 CLINICAL DATA:  Seizure, new onset. EXAM: CT HEAD WITHOUT CONTRAST TECHNIQUE: Contiguous axial images were obtained from the base of the skull through the vertex without intravenous contrast. RADIATION DOSE REDUCTION: This exam was performed according to the departmental dose-optimization program which includes automated exposure control, adjustment of the mA and/or kV according to patient size and/or use of iterative reconstruction technique. COMPARISON:  Brain MRI 09/01/2022 FINDINGS: Brain: No evidence of acute infarction, hemorrhage, hydrocephalus, extra-axial collection or mass lesion/mass effect. Vascular: No hyperdense vessel or unexpected calcification. Skull: Normal. Negative for fracture or focal lesion. Sinuses/Orbits: No acute finding. IMPRESSION: Normal head CT. Electronically Signed   By: Tiburcio Pea M.D.   On: 06/17/2023 06:28    Micro Results    No results found for this or any previous visit (from the past 240 hour(s)).  Today   Subjective    Faith [name] today has no headache,no chest abdominal pain,no new weakness tingling or numbness, feels much better wants to go home today.    Objective   Blood pressure 103/66, pulse 66, temperature 98 F (36.7 C), temperature source Oral, resp. rate 18, height 5' (1.524 m),  weight 81.6 kg, last menstrual period 04/22/2023, SpO2 98%, currently  breastfeeding.   Intake/Output Summary (Last 24 hours) at 06/18/2023 1344 Last data filed at 06/18/2023 0815 Gross per 24 hour  Intake 680 ml  Output --  Net 680 ml    Exam  Awake Alert, No new F.N deficits,    [place].AT,PERRAL Supple Neck,   Symmetrical Chest wall movement, Good air movement bilaterally, CTAB RRR,No Gallops,   +ve B.Sounds, Abd Soft, Non tender,  No Cyanosis, Clubbing or edema    Data Review   Recent Labs  Lab 06/17/23 0338 06/18/23 0609  WBC 12.8* 11.0*  HGB 8.8* 7.9*  HCT 33.1* 29.7*  PLT 404* 355  MCV 62.6* 63.2*  MCH 16.6* 16.8*  MCHC 26.6* 26.6*  RDW 20.1* 19.4*  LYMPHSABS 2.4  --   MONOABS 0.7  --   EOSABS 0.3  --   BASOSABS 0.1  --     Recent Labs  Lab 06/17/23 0338 06/18/23 0609  NA 133* 139  K 3.3* 3.9  CL 98 106  CO2 22 23  ANIONGAP 13 10  GLUCOSE 96 104*  BUN 8 10  CREATININE 0.60 0.60  AST 20  --   ALT 14  --   ALKPHOS 77  --   BILITOT 0.6  --   ALBUMIN 4.3  --   MG 2.2 2.3  CALCIUM 9.2 8.7*    Total Time in preparing paper work, data evaluation and todays exam - 35 minutes  Signature  -    Susa Raring M.D on 06/18/2023 at 1:44 PM   -  To page go to [email or web address]

## 2023-06-18 NOTE — Consult Note (Addendum)
[facility] Psychiatry Consult Note   Service Date: June 18, 2023 LOS:  LOS: 1 day  MRN: [id number] Type of consult:  New   Primary Psychiatric Diagnoses  Adjustment disorder, with depressed mood, acute Substance-induced mood disorder History of bipolar I disorder  Assessment  Faith [name] is a 32 y.o. female with a past psychiatric history of ADHD, bipolar I disorder, and PTSD, substance use history of stimulant use disorder, and significant PMHx of seizure disorder . Psychiatry was consulted for "suicidal" by [name], MD.   Patient developed emotional / behavioral symptoms in response to her boyfriend breaking up with her within 3 months of the onset of the stressor. The symptoms are clinically significant as evidenced by marked distress out of proportion to severity / intensity of the stressor. This stress-related disturbance does not meet criteria for, or is an exacerbation of, another mental disorder. These symptoms do not represent normal bereavement and not better explained by prolonged grief disorder. Predominant symptoms are: depressive (specifically low mood, transient suicidal thought). Patient meets diagnostic criteria for adjustment disorder, with depressed mood, acute. Given patient was also consuming cocaine and alcohol, there may be an element of substance-induced mood disorder as well  She also meets criteria for stimulant use disorder, severe. Consumes alcohol and cannabis recreationally but not rising to level of disorder at present.  After reviewing chart documentation, speaking to collateral contact, and information obtained from patient, it appears she did make a suicidal threat in the heat of an argument with her boyfriend in the setting of substance use, but this threat was transient in nature and there are no persistent suicidal thoughts. She has strong protective factors including desire to get custody back of children (has an undetermined future  court date), goal of quitting cocaine, and religious beliefs against suicide. She does not need inpatient psych admission. No indication for IVC at present. Can follow-up with outpatient psychiatrist to restart meds.  Diagnoses:  Active Hospital problems: Principal Problem:   Seizure-like activity (HCC) Active Problems:   Acute adjustment disorder with depressed mood   Substance induced mood disorder (HCC)   Severe stimulant use disorder (HCC)  Plan and Recommendations  ## Interventions (medications, psychoeducation, etc):  -- hold antidepressant home meds as patient not currently on mood stabilizer and has history of bipolar I disorder -- defer to outpatient psychiatrist [name] Su, MD, [name]) to restart meds  ## Further Work-up:  -- none  -- most recent EKG was in 2023 -- Pertinent labwork reviewed earlier this admission includes: CBC, CMP, toxicology, CT head  ## Medical Decision Making Capacity:  -- Not formally assessed during this encounter  ## Disposition:  -- No indication for inpatient psychiatric admission. Recommending outpatient psychiatry / psychology follow-up. Defer immediate disposition plan to primary team. -- Reached out to [facility] to schedule patient an appointment for Tuesday (06/22/2023) at 2 pm  ## Behavioral / Environmental:  -- Standard delirium precautions and Fall precautions  ##Legal Status -- Patient involuntarily committed, recommend rescinding  Thank you for this consult request. Our recommendations are listed above.  We will sign-off at this time  ## Safety and Observation Level:  - Based on my clinical evaluation, I estimate the patient to be at low risk of self harm in the current setting - At this time, we recommend a no additional level of observation. This decision is based on my review of the chart including patient's history and current presentation, interview of  the patient, mental status examination, and  consideration of suicide risk including evaluating suicidal ideation, plan, intent, suicidal or self-harm behaviors, risk factors, and protective factors. This judgment is based on our ability to directly address suicide risk, implement suicide prevention strategies and develop a safety plan while the patient is in the clinical setting. Please contact our team if there is a concern that risk level has changed.  [name], MD  History Obtained on Initial Interview  Relevant Aspects of Hospital Course:  Admitted on 06/17/2023 for seizure-like activity. She was heard to have made a suicidal threat while arguing with her boyfriend on the phone right before she developed seizure-like activity. She was IVC'd and psychiatry was asked to come see her.  Patient Report:  Patient states she remembers being admitted for seizures but does not remember making any suicidal statements. She admits that perhaps she might have said something while she was "going at it" with her boyfriend (who she has since broken up with), but does not remember making such a statement. She vehemently denies any suicidal thoughts at present. She reports the last time she has had suicidal thoughts was years ago.  Patient clarifies she has not been taking any of her anti-seizure meds due to prohibitive drug costs but reports regularly taking her psychiatric medications.  She identifies as a religious person and believes people who commit suicide will go to [place].  She has 3 children and wants to get back custody of them. She says she just went to court recently and has an upcoming future court date (but does not know when) for custody hearings.  Patient would like to stop using cocaine so she can get her kids back. She asks for "detox meds" and states she has a headache and runny nose.  Collateral information obtained [name], patient's mother) Patient granted permission to speak to contact person without restrictions.  [name]  says she has not spoken to the patient in 4 months. She says patient lies to her a lot so "I can't talk to her every day." [name] says patient is a "drama queen."  Collateral information obtained Gregary Signs [phone], patient's boyfriend) Patient granted permission to speak to contact person without restrictions.  Gregary Signs says "that aint nothing but mad talking." He says she does not use any drugs. Gregary Signs says they are living together and he reports he is keeping her safe and was the one who brought her to the hospital.  Gregary Signs confirms there are no weapons at the house patient is currently residing at.  During this conversation, I directed involved parties to available resources in the event of patient decompensating.  [name], MD 06/18/2023 5:05 PM  Psychiatric and Social History   Psychiatric History  Information collected from patient and available chart review  Prev dx/sx: reports she has anxiety, ADHD, bipolar disorder, and "impulse control disorder." Current Psych Provider: Lynett Fish, MD at [facility] in [place], [place]  Family psych history: not obtained  Social History:  Living situation: lives alone. Lived with boyfriend before admission but reports they are broken up now Spiritual Hx: reports being religious Access to weapons: denies  Substance Use History: Alcohol: socially, drinks 3-4 beers once a week Cannabis (marijuana): occasional use, hard to quantify Cocaine: endorses, uses 2-3 days a week   Other Histories  These are pulled from EMR and updated if appropriate.   Family History:  The patient's family history includes Heart disease in her father; Huntington's disease in her mother.  Medical History: Past Medical History:  Diagnosis Date   ADHD    Anemia    BLOOD TRANSFUSION AFTER C-SECTION ON 06-2016-2 UNITS   Anxiety    Asthma    WELL CONTROLLED   Bipolar 1 disorder, depressed (HCC)    COVID-19    Depression    Diabetes mellitus without  complication (HCC)    Gallstones    GERD (gastroesophageal reflux disease)    Heart murmur    History of blood transfusion 06/2016   RECEIVED 2 UNITS OF BLOOD AFTER C SECTION   Migraines    Ovarian cyst    Seizures (HCC)    LAST SEIZURE January 2022   Tubal pregnancy     Surgical History: Past Surgical History:  Procedure Laterality Date   CESAREAN SECTION N/A 06/22/2016   Procedure: CESAREAN SECTION;  Surgeon: Vena Austria, MD;  Location: [facility] ORS;  Service: Obstetrics;  Laterality: N/A;   CESAREAN SECTION N/A 10/12/2017   Procedure: CESAREAN SECTION;  Surgeon: Vena Austria, MD;  Location: [facility] ORS;  Service: Obstetrics;  Laterality: N/A;   CESAREAN SECTION N/A 03/27/2021   Procedure: CESAREAN SECTION;  Surgeon: Natale Milch, MD;  Location: [facility] ORS;  Service: Obstetrics;  Laterality: N/A;   CHOLECYSTECTOMY N/A 08/13/2016   Procedure: LAPAROSCOPIC CHOLECYSTECTOMY;  Surgeon: Leafy Ro, MD;  Location: [facility] ORS;  Service: General;  Laterality: N/A;   DILATION AND CURETTAGE OF UTERUS     ovarian cyst removed     SALPINGECTOMY Right 2013   ectopic pregnancy. PER PATIENT, STILL HAS BOTH TUBES   TONSILLECTOMY     TONSILLECTOMY AND ADENOIDECTOMY      Medications:   Current Facility-Administered Medications:    acetaminophen (TYLENOL) tablet 650 mg, 650 mg, Oral, Q6H PRN **OR** acetaminophen (TYLENOL) suppository 650 mg, 650 mg, Rectal, Q6H PRN, Kirby Crigler, Mir M, MD   albuterol (PROVENTIL) (2.5 MG/3ML) 0.083% nebulizer solution 2.5 mg, 2.5 mg, Nebulization, Q2H PRN, Kirby Crigler, Mir M, MD   cephALEXin (KEFLEX) capsule 500 mg, 500 mg, Oral, Q8H, Susa Raring K, MD, 500 mg at 06/18/23 1449   enoxaparin (LOVENOX) injection 40 mg, 40 mg, Subcutaneous, Q24H, Kirby Crigler, Mir M, MD, 40 mg at 06/18/23 0741   LORazepam (ATIVAN) injection 1 mg, 1 mg, Intravenous, Q4H PRN, [name], MD   ondansetron (ZOFRAN) tablet 4 mg, 4 mg, Oral, Q6H PRN **OR** ondansetron  (ZOFRAN) injection 4 mg, 4 mg, Intravenous, Q6H PRN, Kirby Crigler, Mir M, MD   traZODone (DESYREL) tablet 25 mg, 25 mg, Oral, QHS PRN, Kirby Crigler, Mir M, MD  Current Outpatient Medications:    alprazolam (XANAX) 2 MG tablet, Take 2 mg by mouth 4 (four) times daily., Disp: , Rfl:    amphetamine-dextroamphetamine (ADDERALL) 20 MG tablet, Take 20 mg by mouth 2 (two) times daily., Disp: , Rfl:    dicyclomine (BENTYL) 20 MG tablet, Take 20 mg by mouth every 8 (eight) hours as needed for spasms., Disp: , Rfl:    DULoxetine (CYMBALTA) 30 MG capsule, Take 30 mg by mouth daily., Disp: , Rfl:    ibuprofen (ADVIL) 600 MG tablet, Take 600 mg by mouth every 6 (six) hours as needed for headache or moderate pain., Disp: , Rfl:    pantoprazole (PROTONIX) 40 MG tablet, Take 40 mg by mouth daily., Disp: , Rfl:    traZODone (DESYREL) 50 MG tablet, Take 100 mg by mouth at bedtime as needed for sleep., Disp: , Rfl:    cephALEXin (KEFLEX) 500 MG capsule, Take 1 capsule (500  mg total) by mouth every 8 (eight) hours., Disp: 9 capsule, Rfl: 0  Allergies: Allergies  Allergen Reactions   Amipaque [Metrizamide] Anaphylaxis   Azulfidine [Sulfasalazine] Hives   Ivp Dye [Iodinated Contrast Media] Anaphylaxis    Per patient, she does not have problems with betadine   Latex Rash   Ms Contin [Morphine] Other (See Comments)    Bradycardia    Mushroom Extract Complex Anaphylaxis, Hives, Shortness Of Breath, Swelling and Other (See Comments)    Throat swelling   Neurontin [Gabapentin] Hives   Tomato Anaphylaxis   Toradol [Ketorolac Tromethamine] Anaphylaxis, Hives and Swelling   Asa [Aspirin] Hives   Dilaudid [Hydromorphone Hcl] Hives   Lamictal [Lamotrigine] Hives   Sulfa Antibiotics Hives   Ultram [Tramadol] Hives, Swelling and Rash   Venofer [Iron Sucrose] Other (See Comments)    Chest pain Warm sensation Hypotension    Adhesive [Tape] Itching    Paper tape OK   Iohexol Rash    Exam Findings  Vital signs:   Temp:  [98 F (36.7 C)-98.1 F (36.7 C)] 98 F (36.7 C) (09/13 0728) Pulse Rate:  [66-92] 66 (09/13 1304) Resp:  [16-18] 18 (09/13 1304) BP: (93-111)/(58-72) 103/66 (09/13 1304) SpO2:  [98 %-100 %] 98 % (09/13 1304)  Psychiatric Specialty Exam: Presentation  General Appearance: Casual; Appropriate for Environment  Eye Contact:Good  Speech:Clear and Coherent  Speech Volume:Normal  Handedness:Right  Mood and Affect  Mood:-- ("alright")  Affect:Appropriate; Congruent; Full Range (appropriate brightening of affect)   Thought Process  Thought Processes:Coherent; Goal Directed; Linear  Descriptions of Associations:Intact  Orientation:Full (Time, Place and Person)  Thought Content:WDL  History of Schizophrenia/Schizoaffective disorder:No data recorded Duration of Psychotic Symptoms:No data recorded Hallucinations:Hallucinations: None  Ideas of Reference:None  Suicidal Thoughts:Suicidal Thoughts: No  Homicidal Thoughts:Homicidal Thoughts: No  Sensorium  Memory:Immediate Good; Recent Good; Remote Good  Judgment:Good  Insight:Good  Executive Functions  Concentration:Good  Attention Span:Good  Recall:Good  Fund of Knowledge:Good  Language:Good  Psychomotor Activity  Psychomotor Activity:Psychomotor Activity: Normal  Assets  Assets:Resilience  Sleep  Sleep:Sleep: Good  Physical Exam: Physical Exam Vitals and nursing note reviewed.  HENT:     Head: Normocephalic and atraumatic.     Comments: Has EEG leads on head Pulmonary:     Effort: Pulmonary effort is normal.  Musculoskeletal:     Cervical back: Normal range of motion.  Neurological:     Mental Status: She is alert.   Blood pressure 103/66, pulse 66, temperature 98 F (36.7 C), temperature source Oral, resp. rate 18, height 5' (1.524 m), weight 81.6 kg, last menstrual period 04/22/2023, SpO2 98%, currently breastfeeding. Body mass index is 35.13 kg/m.

## 2023-06-18 NOTE — Procedures (Signed)
Patient Name: Faith [name]  MRN: [id number]  Epilepsy Attending: Charlsie Quest  Referring Physician/Provider: Charlsie Quest, MD  Duration: 06/17/2023 1845  to 06/18/2023 1319  Patient history: 32 y.o. female with a pertinent medical history of ADHD, anxiety, asthma, bipolar 1, depression, diabetes, GERD, heart murmur, migraines, seizures presenting with seizure. EEG to evaluate for seizure  Level of alertness: Awake,asleep  AEDs during EEG study: None  Technical aspects: This EEG study was done with scalp electrodes positioned according to the 10-20 International system of electrode placement. Electrical activity was reviewed with band pass filter of 1-70Hz , sensitivity of 7 uV/mm, display speed of 30mm/sec with a 60Hz  notched filter applied as appropriate. EEG data were recorded continuously and digitally stored.  Video monitoring was available and reviewed as appropriate.  Description: The posterior dominant rhythm consists of 9 Hz activity of moderate voltage (25-35 uV) seen predominantly in posterior head regions, symmetric and reactive to eye opening and eye closing. Sleep was characterized by vertex waves, sleep spindles (12 to 14 Hz), maximal frontocentral region.    Event button was pressed on 06/17/2023 at 1949 for twitching of right arm and leg.  Concomitant EEG before, during and after the event did not show any EEG due to severe seizure.  Event button was pressed on 06/17/2023 at 2004 and 2103 for head jerking. Concomitant EEG before, during and after the event did not show any EEG to suggest seizure.  Event button was pressed on 06/17/2023 at 2013,  2106, 2319, 2334 and on 06/18/2023 at 0147, 0156, 0218, 4696,2952  for generalized shaking.  Concomitant EEG before, during and after the events did not show any EEG to suggest seizure.  Event button was pressed on 07/05/2023 at 0204.  Patient reported her mouth was feeling weird.  Concomitant EEG before, during and after the event did  not show any EEG changes suggest seizure.  Hyperventilation and photic stimulation were not performed.     IMPRESSION: This study is within normal limits. No seizures or epileptiform discharges were seen throughout the recording.  Multiple events were recorded as described above without concomitant EEG change.   These events were NON-EPILEPTIC.     Zolton Dowson [name]

## 2023-06-18 NOTE — Progress Notes (Signed)
Continuous EEG in place. Pt continued to have seizure activity throughout the night, remains confused and disoriented. States she is not suicidal when she was told she was [employer] for suicidal speech in ED. Pt states she cannot recall the events in the ED. Pt denies suicidal ideation overnight. Sitter bedside. Pt is frequently incontinent with seizures. Pt has poor balance when standing and is high fall risk. Pt unable to make it to bathroom or bedside commode without inducing activity. Pt now using bedpan.

## 2023-06-18 NOTE — Progress Notes (Signed)
LTM EEG discontinued - no skin breakdown at [facility].

## 2023-06-18 NOTE — Discharge Instructions (Addendum)
Do not drive, operate heavy machinery, perform activities at heights, swimming or participation in water activities or provide baby sitting services until you have seen by Primary MD or a Neurologist and advised to do so again.  Follow with Primary MD Emogene Morgan, MD and your Neurologist, Psychiatrist in 7 days   Get CBC, CMP, Magnesium, UA, 2 view Chest X ray -  checked next visit with your primary MD    Activity: As tolerated with Full fall precautions use walker/cane & assistance as needed  Disposition Home   Diet: Heart Healthy    Special Instructions: If you have smoked or chewed Tobacco  in the last 2 yrs please stop smoking, stop any regular Alcohol  and or any Recreational drug use.  On your next visit with your primary care physician please Get Medicines reviewed and adjusted.  Please request your Prim.MD to go over all Hospital Tests and Procedure/Radiological results at the follow up, please get all Hospital records sent to your Prim MD by signing hospital release before you go home.  If you experience worsening of your admission symptoms, develop shortness of breath, life threatening emergency, suicidal or homicidal thoughts you must seek medical attention immediately by calling 911 or calling your MD immediately  if symptoms less severe.  You Must read complete instructions/literature along with all the possible adverse reactions/side effects for all the Medicines you take and that have been prescribed to you. Take any new Medicines after you have completely understood and accpet all the possible adverse reactions/side effects.   Do not drive when taking Pain medications.  Do not take more than prescribed Pain, Sleep and Anxiety Medications  Wear Seat belts while driving.   Please note  You were cared for by a hospitalist during your hospital stay. If you have any questions about your discharge medications or the care you received while you were in the hospital after you  are discharged, you can call the unit and asked to speak with the hospitalist on call if the hospitalist that took care of you is not available. Once you are discharged, your primary care physician will handle any further medical issues. Please note that NO REFILLS for any discharge medications will be authorized once you are discharged, as it is imperative that you return to your primary care physician (or establish a relationship with a primary care physician if you do not have one) for your aftercare needs so that they can reassess your need for medications and monitor your lab values.    You should also follow-up with your primary care doctor, or start seeing one if you don't have one yet. If applicable, here are some scheduled follow-ups for you: [name] Su, Tuesday (06/22/2023) at 2pm   I recommend abstinence from alcohol, tobacco, and other illicit drug use.   If your psychiatric symptoms or suicidal thoughts recur, worsen, or if you have side effects to your psychiatric medications, call your outpatient psychiatric provider, 911, [phone] or go to the nearest emergency department.  Take care!  Signed: [name], MD 06/18/2023, 1:12 PM  For a list of more resources, see the following:  [facility] [address] [place], [place], [address] [phone] phone  New Patient Assessment/Therapy Walk-Ins:  Monday and Wednesday: 8 am until slots are full. Every 1st and 2nd Fridays of the month: 1 pm - 5 pm.  NO ASSESSMENT/THERAPY WALK-INS ON TUESDAYS OR THURSDAYS  New Patient Assessment/Medication Management Walk-Ins:  Monday - Friday:  8 am - 11 am.  For all walk-ins, we ask that you arrive by 7:30 am because patients will be seen in the order of arrival.  Availability is limited; therefore, you may not be seen on the same day that you walk-in.  Our goal is to serve and meet the needs of our community to the best of our ability.  12 STEP PROGRAMS:  Alcoholics  Anonymous of [place] SoftwareChalet.be  Narcotics Anonymous of [place] HitProtect.dk  Al-Anon of [place], [place] www.[place]alanon.org/find-meetings.html  Nar-Anon https://nar-anon.org/find-a-meeting  Naloxone (Narcan) can help reverse an overdose when given to the victim quickly.  [facility] offers free naloxone kits and instructions/training on its use.  Add naloxone to your first aid kit and you can help save a life. A prescription can be filled at your local pharmacy or free kits are provided by the county.  Pick up your free kit at the following locations:   :  [facility], [address] [place] [address] [phone]) [facility] [address] [place] [address] [phone]) [facility] [address] [place] [address]  [place]: [facility] [address] [place] [address] ([phone]) [facility] [address] [place] [address] [phone])
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
## 2023-06-18 NOTE — Plan of Care (Signed)
Problem: Education: Goal: Knowledge of General Education information will improve Description: Including pain rating scale, medication(s)/side effects and non-pharmacologic comfort measures Outcome: Progressing   Problem: Coping: Goal: Level of anxiety will decrease Outcome: Progressing   Problem: Nutrition: Goal: Adequate nutrition will be maintained Outcome: Progressing   Problem: Pain Managment: Goal: General experience of comfort will improve Outcome: Progressing   Problem: Safety: Goal: Ability to remain free from injury will improve Outcome: Progressing

## 2023-06-18 NOTE — Care Management (Signed)
Transition of Care [facility]) - Inpatient Brief Assessment   Patient Details  Name: Faith [name] MRN: [id number] Date of Birth: July 04, 1991  Transition of Care [facility]) CM/SW Contact:    Lockie Pares, RN Phone Number: 06/18/2023, 11:11 AM   Clinical Narrative:  32 year old presented with seizure like activity. She states that she cannot afford her medications of Kepra and vimpat. The patient has insurance therefore is ineligible for [facility] medication assistance. Please send medications to [facility] pharmacy when DC.TTS did not recommend inpatient [facility]. She should be followed as a outpatient.   Transition of Care Asessment: Insurance and Status: Insurance coverage has been reviewed Patient has primary care physician: Yes Home environment has been reviewed: Yes Lives with boyfriend Prior level of function:: Independent Prior/Current Home Services: No current home services Social Determinants of Health Reivew: SDOH reviewed no interventions necessary Readmission risk has been reviewed: Yes Transition of care needs: no transition of care needs at this time

## 2023-06-18 NOTE — Progress Notes (Signed)
Per MD, patient cleared for discharge home. CSW uploaded signed Notice of Commitment change to courts, Envelope [id number]. No other needs identified at this time.   Joaquin Courts, MSW, [facility]

## 2023-06-28 ENCOUNTER — Telehealth: Payer: Self-pay | Admitting: Obstetrics and Gynecology

## 2023-06-28 NOTE — Telephone Encounter (Signed)
Patient called and stated that Dr. Logan Bores wanted to do some test on her, but she has not started her period yet.  She just wanted Dr. Logan Bores to know

## 2023-06-29 ENCOUNTER — Encounter: Payer: Self-pay | Admitting: Obstetrics and Gynecology

## 2023-07-01 ENCOUNTER — Telehealth: Payer: Self-pay | Admitting: Obstetrics and Gynecology

## 2023-07-01 NOTE — Telephone Encounter (Signed)
LVM for PTRC. Patient has scheduled her HSG at [facility]. Patient needs to be scheduled with [facility] so Dr.Evans is able to attend to read the x-ray. Please send patient to me if she returns call to the office.

## 2023-07-07 ENCOUNTER — Ambulatory Visit: Payer: MEDICAID

## 2023-07-07 ENCOUNTER — Encounter: Payer: Self-pay | Admitting: Internal Medicine

## 2023-07-12 ENCOUNTER — Ambulatory Visit: Payer: MEDICAID

## 2023-07-12 ENCOUNTER — Inpatient Hospital Stay: Admission: RE | Admit: 2023-07-12 | Payer: MEDICAID | Source: Ambulatory Visit

## 2023-07-14 ENCOUNTER — Encounter: Payer: Self-pay | Admitting: Internal Medicine

## 2023-07-22 ENCOUNTER — Inpatient Hospital Stay: Payer: MEDICAID

## 2023-07-22 ENCOUNTER — Telehealth: Payer: Self-pay | Admitting: Obstetrics and Gynecology

## 2023-07-22 ENCOUNTER — Inpatient Hospital Stay: Payer: MEDICAID | Admitting: Internal Medicine

## 2023-07-22 NOTE — Telephone Encounter (Signed)
Spoke with patient in regards to rescheduling her HSG. Patient states she has not started her cycle but is expected around October 22nd or October 24th. Patient aware to contact our office at start of menses to coordinate appointment with providers schedule. I will reach out to follow up within the dates given by patient.

## 2023-07-26 ENCOUNTER — Telehealth: Payer: Self-pay | Admitting: Internal Medicine

## 2023-07-26 NOTE — Telephone Encounter (Signed)
Received secure chat from ALLY:  Patient is scheduled for Venofer on Friday, 10/25. It looks like she has a history of previous reactions and also pre-medications ordered. Patient's Venofer infusion encounters will need to be changed to 3 hours and scheduled no later than 2:00 pm, please.   * I spoke with pt and let her know the above. She was okay with keeping the lab/MD appt and canceling the iron infusion for now. If she needs iron she will r/s while she is in clinic

## 2023-07-30 ENCOUNTER — Ambulatory Visit: Payer: MEDICAID

## 2023-07-30 ENCOUNTER — Inpatient Hospital Stay: Payer: MEDICAID

## 2023-07-30 ENCOUNTER — Inpatient Hospital Stay: Payer: MEDICAID | Admitting: Internal Medicine

## 2023-08-04 ENCOUNTER — Telehealth: Payer: Self-pay | Admitting: Obstetrics and Gynecology

## 2023-08-04 NOTE — Telephone Encounter (Signed)
Patient is aware of HSG appointment on 08/10/2023 at 12pm.

## 2023-08-06 ENCOUNTER — Other Ambulatory Visit: Payer: MEDICAID

## 2023-08-06 ENCOUNTER — Ambulatory Visit: Payer: MEDICAID | Admitting: Internal Medicine

## 2023-08-09 ENCOUNTER — Ambulatory Visit: Payer: MEDICAID

## 2023-08-10 ENCOUNTER — Ambulatory Visit: Payer: MEDICAID

## 2023-08-12 ENCOUNTER — Ambulatory Visit: Payer: MEDICAID | Admitting: Obstetrics and Gynecology

## 2023-08-12 DIAGNOSIS — Z3200 Encounter for pregnancy test, result unknown: Secondary | ICD-10-CM

## 2024-02-10 ENCOUNTER — Encounter (HOSPITAL_COMMUNITY): Payer: Self-pay | Admitting: Emergency Medicine

## 2024-02-10 ENCOUNTER — Emergency Department (HOSPITAL_COMMUNITY)
Admission: EM | Admit: 2024-02-10 | Discharge: 2024-02-10 | Disposition: A | Discharge status: Home / Self Care | Payer: MEDICAID | Attending: Emergency Medicine | Admitting: Emergency Medicine

## 2024-02-10 ENCOUNTER — Other Ambulatory Visit: Payer: Self-pay

## 2024-02-10 DIAGNOSIS — G40909 Epilepsy, unspecified, not intractable, without status epilepticus: Secondary | ICD-10-CM | POA: Insufficient documentation

## 2024-02-10 DIAGNOSIS — R569 Unspecified convulsions: Secondary | ICD-10-CM | POA: Diagnosis present

## 2024-02-10 LAB — CBG MONITORING, ED: Glucose-Capillary: 110 mg/dL — ABNORMAL HIGH (ref 70–99)

## 2024-02-10 LAB — RAPID URINE DRUG SCREEN, HOSP PERFORMED
Amphetamines: NOT DETECTED
Barbiturates: NOT DETECTED
Benzodiazepines: NOT DETECTED
Cocaine: POSITIVE — AB
Opiates: NOT DETECTED
Tetrahydrocannabinol: NOT DETECTED

## 2024-02-10 LAB — COMPREHENSIVE METABOLIC PANEL WITH GFR
ALT: 16 U/L (ref 0–44)
AST: 16 U/L (ref 15–41)
Albumin: 3.7 g/dL (ref 3.5–5.0)
Alkaline Phosphatase: 62 U/L (ref 38–126)
Anion gap: 15 (ref 5–15)
BUN: 5 mg/dL — ABNORMAL LOW (ref 6–20)
CO2: 19 mmol/L — ABNORMAL LOW (ref 22–32)
Calcium: 9 mg/dL (ref 8.9–10.3)
Chloride: 101 mmol/L (ref 98–111)
Creatinine, Ser: 0.56 mg/dL (ref 0.44–1.00)
GFR, Estimated: >60 mL/min (ref >60)
Glucose, Bld: 103 mg/dL — ABNORMAL HIGH (ref 70–99)
Potassium: 4 mmol/L (ref 3.5–5.1)
Sodium: 135 mmol/L (ref 135–145)
Total Bilirubin: 0.5 mg/dL (ref 0.0–1.2)
Total Protein: 7.4 g/dL (ref 6.5–8.1)

## 2024-02-10 LAB — CBC WITH DIFFERENTIAL/PLATELET
Abs Immature Granulocytes: 0.15 10*3/uL — ABNORMAL HIGH (ref 0.00–0.07)
Basophils Absolute: 0.1 10*3/uL (ref 0.0–0.1)
Basophils Relative: 1 %
Eosinophils Absolute: 0.3 10*3/uL (ref 0.0–0.5)
Eosinophils Relative: 2 %
HCT: 35 % — ABNORMAL LOW (ref 36.0–46.0)
Hemoglobin: 9.6 g/dL — ABNORMAL LOW (ref 12.0–15.0)
Immature Granulocytes: 1 %
Lymphocytes Relative: 16 %
Lymphs Abs: 2.8 10*3/uL (ref 0.7–4.0)
MCH: 17.2 pg — ABNORMAL LOW (ref 26.0–34.0)
MCHC: 27.4 g/dL — ABNORMAL LOW (ref 30.0–36.0)
MCV: 62.8 fL — ABNORMAL LOW (ref 80.0–100.0)
Monocytes Absolute: 0.8 10*3/uL (ref 0.1–1.0)
Monocytes Relative: 5 %
Neutro Abs: 14 10*3/uL — ABNORMAL HIGH (ref 1.7–7.7)
Neutrophils Relative %: 75 %
Platelets: 469 10*3/uL — ABNORMAL HIGH (ref 150–400)
RBC: 5.57 MIL/uL — ABNORMAL HIGH (ref 3.87–5.11)
RDW: 20 % — ABNORMAL HIGH (ref 11.5–15.5)
WBC: 18.2 10*3/uL — ABNORMAL HIGH (ref 4.0–10.5)
nRBC: 0 % (ref 0.0–0.2)

## 2024-02-10 LAB — HCG, SERUM, QUALITATIVE: Preg, Serum: NEGATIVE

## 2024-02-10 MED ORDER — LEVETIRACETAM 500 MG PO TABS: 500.0000 mg | ORAL_TABLET | Freq: Two times a day (BID) | ORAL | 2 refills | Status: DC

## 2024-02-10 MED ORDER — LEVETIRACETAM (KEPPRA) 500 MG/5 ML ADULT IV PUSH
1500.0000 mg | Freq: Once | INTRAVENOUS | Status: AC
  Administered 2024-02-10: 1500 mg via INTRAVENOUS
  Filled 2024-02-10: qty 15

## 2024-02-10 NOTE — ED Triage Notes (Signed)
 Patient BIB [facility] c/o grand mal seizure lasting 5-10 seconds per boyfriend, per boyfriend, patient has epilepsy and has seizures every day about this time.  Patient is non-compliant with Keppra .  Patient's boyfriend reports that patient normally has focal seizures, not grand mal.  EMS reports patient was post-ictal upon their arrival.    104 HR 150/90 98% RA CBG 99

## 2024-02-10 NOTE — ED Provider Notes (Signed)
 [facility] AT [facility] Provider Note   CSN: [id number] Arrival date & time: 02/10/24  4098     History  Chief Complaint  Patient presents with   Seizures    Faith [name] is a 33 y.o. female.  Brought to the emergency department after generalized tonic-clonic seizure.  Patient postictal at arrival.  Patient does have a history of seizure disorder and reportedly does not take her Keppra .  She reportedly has daily focal seizures but tonight's seizure was generalized.       Home Medications Prior to Admission medications   Medication Sig Start Date End Date Taking? Authorizing Provider  levETIRAcetam  (KEPPRA ) 500 MG tablet Take 1 tablet (500 mg total) by mouth 2 (two) times daily. 02/10/24  Yes Jamez Ambrocio, Marine Sia, MD  alprazolam  (XANAX ) 2 MG tablet Take 2 mg by mouth 4 (four) times daily. 04/17/22   [provider]  amphetamine -dextroamphetamine  (ADDERALL) 20 MG tablet Take 20 mg by mouth 2 (two) times daily.    [provider]  cephALEXin  (KEFLEX ) 500 MG capsule Take 1 capsule (500 mg total) by mouth every 8 (eight) hours. 06/18/23   Singh, Prashant K, MD  dicyclomine (BENTYL) 20 MG tablet Take 20 mg by mouth every 8 (eight) hours as needed for spasms.    [provider]  DULoxetine (CYMBALTA) 30 MG capsule Take 30 mg by mouth daily. 04/29/22   [provider]  ibuprofen  (ADVIL ) 600 MG tablet Take 600 mg by mouth every 6 (six) hours as needed for headache or moderate pain. 03/18/22   [provider]  pantoprazole (PROTONIX) 40 MG tablet Take 40 mg by mouth daily. 06/02/23   [provider]  traZODone  (DESYREL ) 50 MG tablet Take 100 mg by mouth at bedtime as needed for sleep. 07/14/21   [provider]      Allergies    Amipaque [metrizamide], Azulfidine [sulfasalazine], Ivp dye [iodinated contrast media], Latex, Ms contin [morphine], Mushroom extract complex (obsolete), Neurontin   [gabapentin ], Tomato, Toradol [ketorolac tromethamine], Asa [aspirin], Dilaudid  [hydromorphone  hcl], Lamictal [lamotrigine], Sulfa antibiotics, Ultram [tramadol], Venofer  [iron  sucrose], Adhesive [tape], and Iohexol     Review of Systems   Review of Systems  Physical Exam Updated Vital Signs BP (!) 101/58   Pulse 86   Temp 97.8 F (36.6 C) (Oral)   Resp 11   Wt 83 kg   SpO2 97%   BMI 35.74 kg/m  Physical Exam Vitals and nursing note reviewed.  Constitutional:      General: She is not in acute distress.    Appearance: She is well-developed.  HENT:     Head: Normocephalic and atraumatic.     Mouth/Throat:     Mouth: Mucous membranes are moist.  Eyes:     General: Vision grossly intact. Gaze aligned appropriately.     Extraocular Movements: Extraocular movements intact.     Conjunctiva/sclera: Conjunctivae normal.  Cardiovascular:     Rate and Rhythm: Normal rate and regular rhythm.     Pulses: Normal pulses.     Heart sounds: Normal heart sounds, S1 normal and S2 normal. No murmur heard.    No friction rub. No gallop.  Pulmonary:     Effort: Pulmonary effort is normal. No respiratory distress.     Breath sounds: Normal breath sounds.  Abdominal:     General: Bowel sounds are normal.     Palpations: Abdomen is soft.     Tenderness: There is no abdominal tenderness. There is  no guarding or rebound.     Hernia: No hernia is present.  Musculoskeletal:        General: No swelling.     Cervical back: Full passive range of motion without pain, normal range of motion and neck supple. No spinous process tenderness or muscular tenderness. Normal range of motion.     Right lower leg: No edema.     Left lower leg: No edema.  Skin:    General: Skin is warm and dry.     Capillary Refill: Capillary refill takes less than 2 seconds.     Findings: No ecchymosis, erythema, rash or wound.  Neurological:     General: No focal deficit present.     Mental Status: She is lethargic.      Cranial Nerves: Cranial nerves 2-12 are intact.     Sensory: Sensation is intact.     Motor: Motor function is intact.     Coordination: Coordination is intact.  Psychiatric:        Attention and Perception: Attention normal.        Mood and Affect: Mood normal.        Speech: Speech normal.        Behavior: Behavior normal.     ED Results / Procedures / Treatments   Labs (all labs ordered are listed, but only abnormal results are displayed) Labs Reviewed  CBC WITH DIFFERENTIAL/PLATELET - Abnormal; Notable for the following components:      Result Value   WBC 18.2 (*)    RBC 5.57 (*)    Hemoglobin 9.6 (*)    HCT 35.0 (*)    MCV 62.8 (*)    MCH 17.2 (*)    MCHC 27.4 (*)    RDW 20.0 (*)    Platelets 469 (*)    Neutro Abs 14.0 (*)    Abs Immature Granulocytes 0.15 (*)    All other components within normal limits  COMPREHENSIVE METABOLIC PANEL WITH GFR - Abnormal; Notable for the following components:   CO2 19 (*)    Glucose, Bld 103 (*)    BUN 5 (*)    All other components within normal limits  RAPID URINE DRUG SCREEN, HOSP PERFORMED - Abnormal; Notable for the following components:   Cocaine POSITIVE (*)    All other components within normal limits  CBG MONITORING, ED - Abnormal; Notable for the following components:   Glucose-Capillary 110 (*)    All other components within normal limits  HCG, SERUM, QUALITATIVE    EKG EKG Interpretation Date/Time:  Thursday Feb 10 2024 00:39:29 EDT Ventricular Rate:  99 PR Interval:  131 QRS Duration:  89 QT Interval:  373 QTC Calculation: 479 R Axis:   53  Text Interpretation: Sinus rhythm Borderline prolonged QT interval Confirmed by Ballard Bongo [phone]) on 02/10/2024 12:46:41 AM  Radiology No results found.  Procedures Procedures    Medications Ordered in ED Medications  levETIRAcetam  (KEPPRA ) undiluted injection 1,500 mg (1,500 mg Intravenous Given 02/10/24 0114)    ED Course/ Medical Decision Making/  A&P                                 Medical Decision Making Amount and/or Complexity of Data Reviewed Labs: ordered.  Risk Prescription drug management.   Patient presents to the emergency department for evaluation of seizure.  Patient has a known seizure disorder.  She previously was followed by neurology  in [place] but currently does not have a neurologist and does not have a prescription for Keppra .  Patient was postictal at arrival.  She was given a bolus of IV Keppra .  She has appropriately come back to her normal baseline and is awake, alert without complaints currently.  A 8-month supply of Keppra  was sent to her pharmacy.  Outpatient referral to neurology also sent.  Patient's basic labs are unremarkable, does not require hospitalization.        Final Clinical Impression(s) / ED Diagnoses Final diagnoses:  Seizure (HCC)  Seizure disorder (HCC)    Rx / DC Orders ED Discharge Orders          Ordered    levETIRAcetam  (KEPPRA ) 500 MG tablet  2 times daily        02/10/24 0411    Ambulatory referral to Neurology       Comments: An appointment is requested in approximately: 8 weeks   02/10/24 0411              Ballard Bongo, MD 02/10/24 [phone]

## 2024-02-10 NOTE — ED Notes (Signed)
 Patient reports she called her ride and they are on their way.

## 2024-02-14 ENCOUNTER — Encounter: Payer: Self-pay | Admitting: Internal Medicine

## 2024-05-02 ENCOUNTER — Ambulatory Visit: Payer: MEDICAID

## 2024-05-03 ENCOUNTER — Encounter: Payer: Self-pay | Admitting: Neurology

## 2024-05-03 ENCOUNTER — Ambulatory Visit: Payer: MEDICAID | Admitting: Neurology

## 2024-05-03 ENCOUNTER — Encounter (HOSPITAL_COMMUNITY): Payer: Self-pay | Admitting: Emergency Medicine

## 2024-05-03 ENCOUNTER — Emergency Department (HOSPITAL_COMMUNITY)
Admission: EM | Admit: 2024-05-03 | Discharge: 2024-05-03 | Disposition: A | Discharge status: Home / Self Care | Payer: MEDICAID | Attending: Emergency Medicine | Admitting: Emergency Medicine

## 2024-05-03 ENCOUNTER — Other Ambulatory Visit: Payer: Self-pay

## 2024-05-03 ENCOUNTER — Emergency Department (HOSPITAL_COMMUNITY): Payer: MEDICAID

## 2024-05-03 DIAGNOSIS — J45909 Unspecified asthma, uncomplicated: Secondary | ICD-10-CM | POA: Diagnosis not present

## 2024-05-03 DIAGNOSIS — S301XXA Contusion of abdominal wall, initial encounter: Secondary | ICD-10-CM | POA: Diagnosis not present

## 2024-05-03 DIAGNOSIS — R519 Headache, unspecified: Secondary | ICD-10-CM | POA: Diagnosis present

## 2024-05-03 DIAGNOSIS — Z765 Malingerer [conscious simulation]: Secondary | ICD-10-CM

## 2024-05-03 DIAGNOSIS — Z8616 Personal history of COVID-19: Secondary | ICD-10-CM | POA: Diagnosis not present

## 2024-05-03 DIAGNOSIS — E119 Type 2 diabetes mellitus without complications: Secondary | ICD-10-CM | POA: Diagnosis not present

## 2024-05-03 DIAGNOSIS — S20211A Contusion of right front wall of thorax, initial encounter: Secondary | ICD-10-CM | POA: Insufficient documentation

## 2024-05-03 DIAGNOSIS — R45851 Suicidal ideations: Secondary | ICD-10-CM

## 2024-05-03 DIAGNOSIS — S0003XA Contusion of scalp, initial encounter: Secondary | ICD-10-CM | POA: Insufficient documentation

## 2024-05-03 DIAGNOSIS — Z91040 Latex allergy status: Secondary | ICD-10-CM | POA: Diagnosis not present

## 2024-05-03 DIAGNOSIS — S0033XA Contusion of nose, initial encounter: Secondary | ICD-10-CM

## 2024-05-03 HISTORY — DX: Malingerer (conscious simulation): Z76.5

## 2024-05-03 LAB — COMPREHENSIVE METABOLIC PANEL WITH GFR
ALT: 12 U/L (ref 0–44)
AST: 14 U/L — ABNORMAL LOW (ref 15–41)
Albumin: 3.4 g/dL — ABNORMAL LOW (ref 3.5–5.0)
Alkaline Phosphatase: 68 U/L (ref 38–126)
Anion gap: 11 (ref 5–15)
BUN: 10 mg/dL (ref 6–20)
CO2: 20 mmol/L — ABNORMAL LOW (ref 22–32)
Calcium: 8.7 mg/dL — ABNORMAL LOW (ref 8.9–10.3)
Chloride: 104 mmol/L (ref 98–111)
Creatinine, Ser: 0.65 mg/dL (ref 0.44–1.00)
GFR, Estimated: >60 mL/min (ref >60)
Glucose, Bld: 107 mg/dL — ABNORMAL HIGH (ref 70–99)
Potassium: 3.5 mmol/L (ref 3.5–5.1)
Sodium: 135 mmol/L (ref 135–145)
Total Bilirubin: 0.5 mg/dL (ref 0.0–1.2)
Total Protein: 6.6 g/dL (ref 6.5–8.1)

## 2024-05-03 LAB — ETHANOL: Alcohol, Ethyl (B): <15 mg/dL (ref <15)

## 2024-05-03 LAB — I-STAT CHEM 8, ED
BUN: 10 mg/dL (ref 6–20)
Calcium, Ion: 1.18 mmol/L (ref 1.15–1.40)
Chloride: 105 mmol/L (ref 98–111)
Creatinine, Ser: 0.6 mg/dL (ref 0.44–1.00)
Glucose, Bld: 110 mg/dL — ABNORMAL HIGH (ref 70–99)
HCT: 35 % — ABNORMAL LOW (ref 36.0–46.0)
Hemoglobin: 11.9 g/dL — ABNORMAL LOW (ref 12.0–15.0)
Potassium: 3.4 mmol/L — ABNORMAL LOW (ref 3.5–5.1)
Sodium: 140 mmol/L (ref 135–145)
TCO2: 20 mmol/L — ABNORMAL LOW (ref 22–32)

## 2024-05-03 LAB — URINALYSIS, ROUTINE W REFLEX MICROSCOPIC
Bilirubin Urine: NEGATIVE
Glucose, UA: NEGATIVE mg/dL
Ketones, ur: NEGATIVE mg/dL
Leukocytes,Ua: NEGATIVE
Nitrite: NEGATIVE
Protein, ur: >300 mg/dL — AB
Specific Gravity, Urine: >1.03 — ABNORMAL HIGH (ref 1.005–1.030)
pH: 6 (ref 5.0–8.0)

## 2024-05-03 LAB — I-STAT CG4 LACTIC ACID, ED: Lactic Acid, Venous: 2 mmol/L (ref 0.5–1.9)

## 2024-05-03 LAB — ABO/RH: ABO/RH(D): O POS

## 2024-05-03 LAB — URINALYSIS, MICROSCOPIC (REFLEX)

## 2024-05-03 LAB — HCG, SERUM, QUALITATIVE: Preg, Serum: NEGATIVE

## 2024-05-03 LAB — CBC
HCT: 34.6 % — ABNORMAL LOW (ref 36.0–46.0)
Hemoglobin: 9.6 g/dL — ABNORMAL LOW (ref 12.0–15.0)
MCH: 17.9 pg — ABNORMAL LOW (ref 26.0–34.0)
MCHC: 27.7 g/dL — ABNORMAL LOW (ref 30.0–36.0)
MCV: 64.6 fL — ABNORMAL LOW (ref 80.0–100.0)
Platelets: 390 K/uL (ref 150–400)
RBC: 5.36 MIL/uL — ABNORMAL HIGH (ref 3.87–5.11)
RDW: 20.4 % — ABNORMAL HIGH (ref 11.5–15.5)
WBC: 12.7 K/uL — ABNORMAL HIGH (ref 4.0–10.5)
nRBC: 0 % (ref 0.0–0.2)

## 2024-05-03 LAB — HCG, QUANTITATIVE, PREGNANCY: hCG, Beta Chain, Quant, S: <1 m[IU]/mL (ref <5)

## 2024-05-03 LAB — SAMPLE TO BLOOD BANK

## 2024-05-03 LAB — PROTIME-INR
INR: 1.1 (ref 0.8–1.2)
Prothrombin Time: 15.3 s — ABNORMAL HIGH (ref 11.4–15.2)

## 2024-05-03 MED ORDER — ACETAMINOPHEN 500 MG PO TABS
1000.0000 mg | ORAL_TABLET | Freq: Once | ORAL | Status: AC
  Administered 2024-05-03: 1000 mg via ORAL
  Filled 2024-05-03: qty 2
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
## 2024-05-03 NOTE — Progress Notes (Signed)
 Responded to page to support pt.  Chaplain provided emotional and spiritual support. Chaplain available as needed.     Rayleen Dade, [place], [facility] , Pager [phone]

## 2024-05-03 NOTE — Discharge Instructions (Addendum)
 Please closely follow-up with your outpatient provider at [facility] Please adhere to safety return precautions and safety plan listed below Please continue current outpatient psychiatric medications  Safety Plan Faith [name] will reach out to Faith [name] (friend), call 911 or call mobile crisis, or go to nearest emergency room if condition worsens or if suicidal thoughts become active Patients' will follow up with Faith [name] at [facility] for outpatient psychiatric services (therapy/medication management).  The suicide prevention education provided includes the following: Suicide risk factors Suicide prevention and interventions National Suicide Hotline telephone number [facility] assessment telephone number [facility] Emergency Assistance 911 [facility] and/or Residential Mobile Crisis Unit telephone number Request made of family/significant other to: Faith [name] (friend) Data processing manager (e.g., guns, rifles, knives), all items previously/currently identified as safety concern.   Remove drugs/medications (over the counter, prescriptions, illicit drugs), all items previously/currently identified as a safety concern.
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
## 2024-05-03 NOTE — ED Notes (Addendum)
 Patient was given scrubs and her IV was removed. Per Dr Doretha pt medically cleared. Sitter at bedside. Security called to wand patient before room transfer

## 2024-05-03 NOTE — ED Notes (Signed)
 CCMD is monitoring this pt.

## 2024-05-03 NOTE — ED Notes (Signed)
 [employer] arrived to question PT, at bedside
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
## 2024-06-09 ENCOUNTER — Encounter: Payer: MEDICAID | Admitting: Obstetrics and Gynecology

## 2024-07-10 ENCOUNTER — Encounter: Payer: MEDICAID | Admitting: Women's Health

## 2024-07-13 ENCOUNTER — Encounter: Payer: Self-pay | Admitting: *Deleted

## 2024-07-13 ENCOUNTER — Encounter: Payer: MEDICAID | Admitting: Women's Health

## 2024-07-13 ENCOUNTER — Telehealth: Payer: Self-pay

## 2024-07-13 NOTE — Telephone Encounter (Signed)
 Called 2x. No voicemail set up. Sent patient MyChart message.

## 2024-09-01 ENCOUNTER — Encounter: Payer: Self-pay | Admitting: Internal Medicine

## 2024-09-06 ENCOUNTER — Telehealth (INDEPENDENT_AMBULATORY_CARE_PROVIDER_SITE_OTHER): Payer: MEDICAID

## 2024-09-06 DIAGNOSIS — O0990 Supervision of high risk pregnancy, unspecified, unspecified trimester: Secondary | ICD-10-CM

## 2024-09-06 DIAGNOSIS — Z3A16 16 weeks gestation of pregnancy: Secondary | ICD-10-CM

## 2024-09-06 DIAGNOSIS — O0992 Supervision of high risk pregnancy, unspecified, second trimester: Secondary | ICD-10-CM

## 2024-09-06 MED ORDER — GOJJI WEIGHT SCALE MISC: 1.0000 | Freq: Once | 0 refills | Status: AC

## 2024-09-06 MED ORDER — BLOOD PRESSURE KIT DEVI: 1.0000 | Freq: Once | 0 refills | Status: AC

## 2024-09-06 NOTE — Progress Notes (Signed)
 New OB Intake  I connected with Faith [name]  on 09/06/24 at  1:15 PM EST by MyChart Video Visit and verified that I am speaking with the correct person using two identifiers. Nurse is located at [facility] and pt is located in parked vehicle.  I discussed the limitations, risks, security and privacy concerns of performing an evaluation and management service by telephone and the availability of in person appointments. I also discussed with the patient that there may be a patient responsible charge related to this service. The patient expressed understanding and agreed to proceed.  I explained I am completing New OB Intake today. We discussed EDD of 02/18/25 based on US  at 8.2 weeks. Pt is [id number]. I reviewed her allergies, medications and Medical/Surgical/OB history.    Patient Active Problem List   Diagnosis Date Noted   Seizure-like activity (HCC) 06/17/2023   Iron  deficiency anemia 05/13/2022   History of cesarean section 03/27/2021   Hidradenitis suppurativa 05/24/2018   Bipolar disorder (HCC) 10/01/2017   History of cesarean delivery 07/12/2017   Family history of Huntington's disease    Asthma affecting pregnancy, antepartum 05/10/2017   Smoking (tobacco) complicating pregnancy, unspecified trimester 05/10/2017   History of anemia 04/19/2017   Supervision of high risk pregnancy, antepartum 03/02/2017   ADD (attention deficit disorder) 07/17/2015   Post traumatic stress disorder (PTSD) 06/16/2013   Depression 06/15/2013   Status epilepticus (HCC) 06/14/2013   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  Pt currently staying with a friend she feels safe with, actively working on finding housing.  Discussed prenatal navigators providing resources and our [employer] in office.  She is currently seeking therapy after SI episode last month, feeling much better.     Delivery Plans Plans to deliver at [facility]. Discussed the nature of our practice with multiple providers including residents and students as well as female and female providers. Due to the size of the practice, the delivering provider may not be the same as those providing prenatal care.   Patient is not interested in water birth.  MyChart/Babyscripts MyChart access verified. I explained pt will have some visits in office and some virtually. Babyscripts instructions given and order placed. Patient verifies receipt of registration text/e-mail. Account successfully created and app downloaded. If patient is a candidate for Optimized scheduling, add to sticky note.   Blood Pressure Cuff/Weight Scale Blood pressure cuff ordered for patient to pick-up from [employer]. Explained after first prenatal appt pt will check weekly and document in Babyscripts. Patient does not have weight scale; order sent to [facility], patient may track weight weekly in Babyscripts.  Anatomy US  Explained first scheduled US  will be around 19 weeks. Anatomy US  scheduled for 10/02/24 at 09:45am [facility] Location).  Is patient a CenteringPregnancy candidate?  Not a candidate due to DM    Is patient a Mom+Baby Combined Care candidate?  Not a candidate   If accepted, confirm patient does not intend to move from the area for at least 12 months, then notify Mom+Baby staff  Is patient a candidate for Babyscripts Optimization? No, due to high risk.   First visit review I reviewed new OB appt with patient. Explained pt will be seen by Hoyle, NP at first visit 09/26/24 at 2:55 pm. Discussed [facility] genetic screening with patient. Genetic Testing and Routine prenatal labs partially collected with [facility].  Last Pap: Patient States she had one last month with [facility]. Diagnosis  Date Value Ref Range Status  08/28/2020   Final   - Negative for intraepithelial lesion or malignancy (NILM)    Waddell LITTIE Burows, RN 09/06/2024  5:36 PM

## 2024-09-07 ENCOUNTER — Encounter (HOSPITAL_COMMUNITY): Payer: Self-pay

## 2024-09-08 ENCOUNTER — Ambulatory Visit (HOSPITAL_COMMUNITY): Payer: MEDICAID | Admitting: Clinical

## 2024-09-08 ENCOUNTER — Encounter (HOSPITAL_COMMUNITY): Payer: Self-pay

## 2024-09-18 ENCOUNTER — Other Ambulatory Visit: Payer: Self-pay

## 2024-09-18 ENCOUNTER — Emergency Department
Admission: EM | Admit: 2024-09-18 | Discharge: 2024-09-18 | Disposition: A | Discharge status: Home / Self Care | Payer: MEDICAID | Attending: Emergency Medicine | Admitting: Emergency Medicine

## 2024-09-18 DIAGNOSIS — I959 Hypotension, unspecified: Secondary | ICD-10-CM | POA: Diagnosis not present

## 2024-09-18 DIAGNOSIS — G40909 Epilepsy, unspecified, not intractable, without status epilepticus: Secondary | ICD-10-CM | POA: Diagnosis not present

## 2024-09-18 DIAGNOSIS — Z3A20 20 weeks gestation of pregnancy: Secondary | ICD-10-CM | POA: Diagnosis not present

## 2024-09-18 DIAGNOSIS — O26892 Other specified pregnancy related conditions, second trimester: Secondary | ICD-10-CM | POA: Diagnosis present

## 2024-09-18 DIAGNOSIS — R569 Unspecified convulsions: Secondary | ICD-10-CM

## 2024-09-18 LAB — CBC
HCT: 32.2 % — ABNORMAL LOW (ref 36.0–46.0)
Hemoglobin: 9.3 g/dL — ABNORMAL LOW (ref 12.0–15.0)
MCH: 20.4 pg — ABNORMAL LOW (ref 26.0–34.0)
MCHC: 28.9 g/dL — ABNORMAL LOW (ref 30.0–36.0)
MCV: 70.5 fL — ABNORMAL LOW (ref 80.0–100.0)
Platelets: 276 K/uL (ref 150–400)
RBC: 4.57 MIL/uL (ref 3.87–5.11)
RDW: 20.9 % — ABNORMAL HIGH (ref 11.5–15.5)
WBC: 12.5 K/uL — ABNORMAL HIGH (ref 4.0–10.5)
nRBC: 0 % (ref 0.0–0.2)

## 2024-09-18 LAB — BASIC METABOLIC PANEL WITH GFR
Anion gap: 12 (ref 5–15)
BUN: 6 mg/dL (ref 6–20)
CO2: 20 mmol/L — ABNORMAL LOW (ref 22–32)
Calcium: 9 mg/dL (ref 8.9–10.3)
Chloride: 108 mmol/L (ref 98–111)
Creatinine, Ser: 0.44 mg/dL (ref 0.44–1.00)
GFR, Estimated: >60 mL/min (ref >60)
Glucose, Bld: 137 mg/dL — ABNORMAL HIGH (ref 70–99)
Potassium: 3.2 mmol/L — ABNORMAL LOW (ref 3.5–5.1)
Sodium: 139 mmol/L (ref 135–145)

## 2024-09-18 MED ORDER — LEVETIRACETAM 500 MG PO TABS: 500.0000 mg | ORAL_TABLET | Freq: Two times a day (BID) | ORAL | 2 refills | Status: DC

## 2024-09-18 MED ORDER — LEVETIRACETAM (KEPPRA) 500 MG/5 ML ADULT IV PUSH
1000.0000 mg | Freq: Once | INTRAVENOUS | Status: AC
  Administered 2024-09-18: 01:00:00 1000 mg via INTRAVENOUS
  Filled 2024-09-18: qty 10

## 2024-09-18 MED ORDER — SODIUM CHLORIDE 0.9 % IV BOLUS
500.0000 mL | Freq: Once | INTRAVENOUS | Status: AC
  Administered 2024-09-18: 01:00:00 500 mL via INTRAVENOUS

## 2024-09-18 MED ORDER — CEPHALEXIN 500 MG PO CAPS: 500.0000 mg | ORAL_CAPSULE | Freq: Three times a day (TID) | ORAL | 0 refills | Status: AC

## 2024-09-18 MED ORDER — SODIUM CHLORIDE 0.9 % IV BOLUS
500.0000 mL | Freq: Once | INTRAVENOUS | Status: AC
  Administered 2024-09-18: 03:00:00 500 mL via INTRAVENOUS

## 2024-09-18 MED ORDER — METFORMIN HCL 500 MG PO TABS: 500.0000 mg | ORAL_TABLET | Freq: Two times a day (BID) | ORAL | 1 refills | Status: DC

## 2024-09-18 NOTE — ED Provider Notes (Signed)
 [facility] Provider Note    Event Date/Time   First MD Initiated Contact with Patient 09/18/24 0115     (approximate)   History   Hypotension   HPI  Faith [name] is a 33 y.o. female with notable past medical history of malingering adjustment disorder mood disorders diabetes affecting pregnancy, seizure disorder, bipolar disorder.  She reports she is supposed to be taking Keppra  500 mg twice a day but ran out about a week and a half ago.  She has a seizure disorder that preceded her pregnancy, she is approximately [redacted] weeks pregnant.  She denies abdominal pain, no vaginal bleeding.  She reports typically has a seizure about once a month and afterwards she feels usually weak and somewhat lightheaded     Physical Exam   Triage Vital Signs: ED Triage Vitals  Encounter Vitals Group     BP 09/18/24 0117 106/71     Girls Systolic BP Percentile --      Girls Diastolic BP Percentile --      Boys Systolic BP Percentile --      Boys Diastolic BP Percentile --      Pulse Rate 09/18/24 0117 77     Resp 09/18/24 0117 20     Temp 09/18/24 0117 98.5 F (36.9 C)     Temp Source 09/18/24 0117 Oral     SpO2 09/18/24 0117 100 %     Weight --      Height 09/18/24 0115 1.524 m (5')     Head Circumference --      Peak Flow --      Pain Score 09/18/24 0115 0     Pain Loc --      Pain Education --      Exclude from Growth Chart --     Most recent vital signs: Vitals:   09/18/24 0205 09/18/24 0300  BP: (!) 84/38 (!) 107/57  Pulse: 86 82  Resp: (!) 21 18  Temp:    SpO2: 99% 99%     General: Awake, no distress.  CV:  Good peripheral perfusion.  Resp:  Normal effort.  Abd:  No distention.  Other:  No tongue injury, normal neurologic exam   ED Results / Procedures / Treatments   Labs (all labs ordered are listed, but only abnormal results are displayed) Labs Reviewed  CBC - Abnormal; Notable for the following components:      Result Value   WBC 12.5  (*)    Hemoglobin 9.3 (*)    HCT 32.2 (*)    MCV 70.5 (*)    MCH 20.4 (*)    MCHC 28.9 (*)    RDW 20.9 (*)    All other components within normal limits  BASIC METABOLIC PANEL WITH GFR - Abnormal; Notable for the following components:   Potassium 3.2 (*)    CO2 20 (*)    Glucose, Bld 137 (*)    All other components within normal limits     EKG  ED ECG REPORT I, Lamar Price, the attending physician, personally viewed and interpreted this ECG.  Date: 09/18/2024  Rhythm: normal sinus rhythm QRS Axis: normal Intervals: normal ST/T Wave abnormalities: normal Narrative Interpretation: no evidence of acute ischemia    RADIOLOGY     PROCEDURES:  Critical Care performed:   Procedures   MEDICATIONS ORDERED IN ED: Medications  levETIRAcetam  (KEPPRA ) undiluted injection 1,000 mg (1,000 mg Intravenous Given 09/18/24 0119)  sodium chloride  0.9 % bolus  500 mL (0 mLs Intravenous Stopped 09/18/24 0207)  sodium chloride  0.9 % bolus 500 mL (0 mLs Intravenous Stopped 09/18/24 0307)     IMPRESSION / MDM / ASSESSMENT AND PLAN / ED COURSE  I reviewed the triage vital signs and the nursing notes. Patient's presentation is most consistent with severe exacerbation of chronic illness.  Patient is approximately [redacted] weeks pregnant, comes via EMS after seizure.  She has seizure disorder preceding pregnancy which is likely cause especially given that she has been out of her medication for over a week and a half.  Had been taking Keppra  500 mg twice daily which had controlled her seizures fairly well.  Will give Keppra  load here, check basic labs, IV fluids for initial soft blood pressure.  Monitor for further seizure activity, anticipate discharge with medication refill  Patient feeling improved after IV fluids, blood pressure is improved, no further dizziness,, medication refills provided, Keflex  prescription for likely early abscess on the abdominal wall, outpatient follow-up with OB  recommended      FINAL CLINICAL IMPRESSION(S) / ED DIAGNOSES   Final diagnoses:  Seizure (HCC)     Rx / DC Orders   ED Discharge Orders          Ordered    levETIRAcetam  (KEPPRA ) 500 MG tablet  2 times daily        09/18/24 0212    metFORMIN  (GLUCOPHAGE ) 500 MG tablet  2 times daily with meals        09/18/24 0234    cephALEXin  (KEFLEX ) 500 MG capsule  3 times daily        09/18/24 0234             Note:  This document was prepared using Dragon voice recognition software and may include unintentional dictation errors.   Arlander Charleston, MD 09/18/24 [phone]

## 2024-09-18 NOTE — ED Triage Notes (Signed)
 BIB [facility] from home for possible seizures. Pt is [redacted] weeks pregnant.  No seizures for EMS. BP 86/40 on scene. Last 88/23 BP. 22G RFA.  Non witnessed by family. Pt has Hx of seizures. BGL 184. Pt has iron  deficiency.  Pt is caox4. Pt takes KEPPRA  for seizures. Pt just moved here and is non-compliant with her seizure meds.

## 2024-09-26 ENCOUNTER — Encounter: Payer: Self-pay | Admitting: Student

## 2024-09-26 ENCOUNTER — Other Ambulatory Visit: Payer: Self-pay

## 2024-09-26 ENCOUNTER — Ambulatory Visit (INDEPENDENT_AMBULATORY_CARE_PROVIDER_SITE_OTHER): Payer: MEDICAID | Admitting: Student

## 2024-09-26 VITALS — BP 95/64 | HR 93 | Wt 234.4 lb

## 2024-09-26 DIAGNOSIS — R41840 Attention and concentration deficit: Secondary | ICD-10-CM

## 2024-09-26 DIAGNOSIS — O99342 Other mental disorders complicating pregnancy, second trimester: Secondary | ICD-10-CM | POA: Diagnosis not present

## 2024-09-26 DIAGNOSIS — O24119 Pre-existing diabetes mellitus, type 2, in pregnancy, unspecified trimester: Secondary | ICD-10-CM

## 2024-09-26 DIAGNOSIS — Z3A19 19 weeks gestation of pregnancy: Secondary | ICD-10-CM

## 2024-09-26 DIAGNOSIS — Z98891 History of uterine scar from previous surgery: Secondary | ICD-10-CM

## 2024-09-26 DIAGNOSIS — O24112 Pre-existing diabetes mellitus, type 2, in pregnancy, second trimester: Secondary | ICD-10-CM | POA: Diagnosis not present

## 2024-09-26 DIAGNOSIS — Z8679 Personal history of other diseases of the circulatory system: Secondary | ICD-10-CM | POA: Diagnosis not present

## 2024-09-26 DIAGNOSIS — O0990 Supervision of high risk pregnancy, unspecified, unspecified trimester: Secondary | ICD-10-CM

## 2024-09-26 DIAGNOSIS — E1169 Type 2 diabetes mellitus with other specified complication: Secondary | ICD-10-CM | POA: Diagnosis not present

## 2024-09-26 DIAGNOSIS — O09899 Supervision of other high risk pregnancies, unspecified trimester: Secondary | ICD-10-CM

## 2024-09-26 DIAGNOSIS — Z141 Cystic fibrosis carrier: Secondary | ICD-10-CM | POA: Diagnosis not present

## 2024-09-26 DIAGNOSIS — E1122 Type 2 diabetes mellitus with diabetic chronic kidney disease: Secondary | ICD-10-CM

## 2024-09-26 DIAGNOSIS — D509 Iron deficiency anemia, unspecified: Secondary | ICD-10-CM

## 2024-09-26 DIAGNOSIS — Z7984 Long term (current) use of oral hypoglycemic drugs: Secondary | ICD-10-CM

## 2024-09-26 DIAGNOSIS — O09892 Supervision of other high risk pregnancies, second trimester: Secondary | ICD-10-CM | POA: Diagnosis not present

## 2024-09-26 DIAGNOSIS — G40901 Epilepsy, unspecified, not intractable, with status epilepticus: Secondary | ICD-10-CM | POA: Diagnosis not present

## 2024-09-26 DIAGNOSIS — O0992 Supervision of high risk pregnancy, unspecified, second trimester: Secondary | ICD-10-CM

## 2024-09-26 DIAGNOSIS — F3170 Bipolar disorder, currently in remission, most recent episode unspecified: Secondary | ICD-10-CM

## 2024-09-26 DIAGNOSIS — F32A Depression, unspecified: Secondary | ICD-10-CM

## 2024-09-26 NOTE — Progress Notes (Signed)
 "  INITIAL PRENATAL VISIT  History:  Faith [name] is a 33 y.o. [id number] at [redacted]w[redacted]d by ultrasound being seen today for her first obstetrical visit.  Her obstetrical history is significant for multiple factors (see problem list below). Patient does intend to breast feed and bottle feed. Pregnancy history fully reviewed.  Patient reports feeling weak and tired in pregnancy.  HISTORY: OB History  Gravida Para Term Preterm AB Living  7 3 3  0 3 3  SAB IAB Ectopic Multiple Live Births  2 0 1 0 3    # Outcome Date GA Lbr Len/2nd Weight Sex Type Anes PTL Lv  7 Current           6 Term 03/27/21 [redacted]w[redacted]d  8 lb 9.6 oz (3.9 kg) M CS-LTranv Spinal  LIV     Name: Wiegert,PENDINGBABY     Apgar1: 8  Apgar5: 9  5 Term 10/12/17 [redacted]w[redacted]d  9 lb 9.8 oz (4.36 kg) F CS-LTranv Spinal  LIV     Name: Fleites,GIRL Jennifier     Apgar1: 8  Apgar5: 9  4 Term 06/22/16 [redacted]w[redacted]d  8 lb 7.1 oz (3.83 kg) M CS-LTranv Spinal, EPI  LIV     Name: Henriques,PENDINGBABY     Apgar1: 8  Apgar5: 9  3 SAB 2014 [redacted]w[redacted]d         2 SAB 2012 [redacted]w[redacted]d         1 Ectopic             Last pap smear was done 08/2024 and was normal  Past Medical History:  Diagnosis Date   Acute adjustment disorder with depressed mood 06/18/2023   ADHD    Anemia    BLOOD TRANSFUSION AFTER C-SECTION ON 06-2016-2 UNITS   Anxiety    Asthma    WELL CONTROLLED   B12 deficiency 06/24/2022   Bipolar 1 disorder, depressed (HCC)    COVID-19    Depression    Diabetes mellitus affecting pregnancy, unspecified trimester 09/19/2020   Diabetes mellitus without complication (HCC)    Gallstones    Gastroesophageal reflux disease 08/21/2010   GERD (gastroesophageal reflux disease)    Heart murmur    History of blood transfusion 06/2016   RECEIVED 2 UNITS OF BLOOD AFTER C SECTION   History of maternal blood transfusion, currently pregnant 04/19/2017   Malingering 05/03/2024   Menometrorrhagia 05/13/2022   Migraines    Ovarian cyst    Seizure disorder during pregnancy, third  trimester (HCC) 05/10/2017   Seizures (HCC)    LAST SEIZURE January 2022   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   Smoking (tobacco) complicating pregnancy, unspecified trimester 05/10/2017   Substance induced mood disorder (HCC) 06/18/2023   Suicidal ideation 06/15/2013   Tubal pregnancy    Past Surgical History:  Procedure Laterality Date   CESAREAN SECTION N/A 06/22/2016   Procedure: CESAREAN SECTION;  Surgeon: Glory High, MD;  Location: [facility];  Service: Obstetrics;  Laterality: N/A;   CESAREAN SECTION N/A 10/12/2017   Procedure: CESAREAN SECTION;  Surgeon: High Glory, MD;  Location: [facility];  Service: Obstetrics;  Laterality: N/A;   CESAREAN SECTION N/A 03/27/2021   Procedure: CESAREAN SECTION;  Surgeon: Victor Claudell SAUNDERS, MD;  Location: [facility];  Service: Obstetrics;  Laterality: N/A;   CHOLECYSTECTOMY N/A 08/13/2016   Procedure: LAPAROSCOPIC CHOLECYSTECTOMY;  Surgeon: Laneta JULIANNA Luna, MD;  Location: [facility] OR[facility];  Service: General;  Laterality: N/A;   DILATION AND CURETTAGE OF UTERUS     ovarian cyst  removed     SALPINGECTOMY Right 2013   ectopic pregnancy. PER PATIENT, STILL HAS BOTH TUBES   TONSILLECTOMY     TONSILLECTOMY AND ADENOIDECTOMY     Family History  Problem Relation Age of Onset   Heart disease Father    Huntington's disease Mother    Social History[1] Allergies[2] Medications Ordered Prior to Encounter[3]  Review of Systems Pertinent items noted in HPI and remainder of comprehensive ROS otherwise negative.   Physical Exam:   Vitals:   09/26/24 1518  BP: 95/64  Pulse: 93  Weight: 234 lb 6.4 oz (106.3 kg)   Fetal Heart Rate (bpm): 159  Uterine size:  Gravid  General: well-developed, well-nourished female in no acute distress  Breasts:  declined   Skin: normal coloration and turgor, no rashes  Neurologic: oriented, normal, negative, normal mood  Extremities: normal strength, tone, and muscle mass, ROM of all joints is normal   HEENT PERRLA, extraocular movement intact and sclera clear, anicteric  Neck supple and no masses  Cardiovascular: regular rate and rhythm  Respiratory:  no respiratory distress, normal breath sounds  Abdomen: soft, non-tender; bowel sounds normal; no masses,  no organomegaly  Pelvic: declined   Assessment:  Pregnancy: [id number] Patient Active Problem List   Diagnosis Date Noted   Seizure-like activity (HCC) 06/17/2023   Iron  deficiency anemia 05/13/2022   History of cesarean section 03/27/2021   Hidradenitis suppurativa 05/24/2018   Bipolar disorder (HCC) 10/01/2017   Family history of Huntington's disease    History of anemia 04/19/2017   Supervision of high risk pregnancy, antepartum 03/02/2017   ADD (attention deficit disorder) 07/17/2015   Post traumatic stress disorder (PTSD) 06/16/2013   Depression 06/15/2013   Status epilepticus (HCC) 06/14/2013   Type 2 diabetes mellitus with obesity 06/14/2013    Plan:  1. Supervision of high risk pregnancy, antepartum (Primary) - Doing well overall - Ambulatory referral to Nutrition and Diabetic Education - Prenatal Vit-Fe Fumarate-FA (MULTIVITAMIN-PRENATAL) 27-0.8 MG TABS tablet; Take 1 tablet by mouth daily at 12 noon. - metFORMIN  (GLUCOPHAGE ) 500 MG tablet; Take 1 tablet (500 mg total) by mouth 2 (two) times daily with a meal.  Dispense: 60 tablet; Refill: 2 - Accu-Chek Softclix Lancets lancets; Use as instructed  Dispense: 100 each; Refill: 12 - Blood Glucose Monitoring Suppl (ACCU-CHEK GUIDE) w/Device KIT; 1 Device by Does not apply route in the morning, at noon, in the evening, and at bedtime.  Dispense: 1 kit; Refill: 0 - Glucose Blood (BLOOD GLUCOSE TEST STRIPS) STRP; 1 each by Does not apply route as directed. Dispense based on patient and insurance preference. Use up to four times daily as directed. (FOR ICD-10 E10.9, E11.9).  Dispense: 100 strip; Refill: 0 - Amb ref to Integrated Behavioral Health - Ambulatory referral to  Endocrinology  2. History of cesarean delivery - First delivery was Emergent C/S due to infant distress.  - Following C/S were planned   3. Type 2 diabetes mellitus in pregnancy, unspecified trimester - Ha1c 5.3 on 08/2024 - Ambulatory referral to Nutrition and Diabetic Education - metFORMIN  (GLUCOPHAGE ) 500 MG tablet; Take 1 tablet (500 mg total) by mouth 2 (two) times daily with a meal.  Dispense: 60 tablet; Refill: 2 - Accu-Chek Softclix Lancets lancets; Use as instructed  Dispense: 100 each; Refill: 12 - Blood Glucose Monitoring Suppl (ACCU-CHEK GUIDE) w/Device KIT; 1 Device by Does not apply route in the morning, at noon, in the evening, and at bedtime.  Dispense: 1 kit; Refill: 0 -  Glucose Blood (BLOOD GLUCOSE TEST STRIPS) STRP; 1 each by Does not apply route as directed. Dispense based on patient and insurance preference. Use up to four times daily as directed. (FOR ICD-10 E10.9, E11.9).  Dispense: 100 strip; Refill: 0 - Ambulatory referral to Endocrinology - detail anatomy scan ordered   4. Depression during pregnancy in second trimester 5. Attention or concentration deficit 6. Bipolar disorder in partial remission, most recent episode unspecified type - Reviewed EHR from inpatient [facility] admission in Nov 2025. Recommend continued support and monitoring this pregnancy  - Amb ref to Integrated Behavioral Health - Ambulatory Referral to Primary Care (Establish Care)  7. Status epilepticus (HCC) - Recent seizure 09/18/24 - Seizure disorder managed by Keppra  - Has active referral to neurology in place - Ambulatory Referral to Primary Care (Establish Care)  8. [redacted] weeks gestation of pregnancy   9. Type 2 diabetes mellitus with other specified complication, without long-term current use of insulin  (HCC) - See above - Will need to establish long-term care following pregnancy  - Ambulatory Referral to Primary Care (Establish Care)  10. History of heart murmur in childhood - May  consider referral to OB Cards  11. Iron  deficiency anemia, unspecified iron  deficiency anemia type - ferrous sulfate  325 (65 FE) MG tablet; Take 1 tablet (325 mg total) by mouth daily.  Dispense: 30 tablet; Refill: 0  12. Cystic fibrosis carrier, antepartum - Can consider partner testing   Initial labs drawn. Continue prenatal vitamins. Problem list reviewed and updated. Genetic Screening discussed, [employer]: results reviewed. Ultrasound discussed; fetal anatomic survey: scheduled. Anticipatory guidance about prenatal visits given including labs, ultrasounds, and testing. Weight gain recommendations per IOM guidelines reviewed: underweight/BMI 18.5 or less > 28 - 40 lbs; normal weight/BMI 18.5 - 24.9 > 25 - 35 lbs; overweight/BMI 25 - 29.9 > 15 - 25 lbs; obese/BMI 30 or more > 11 - 20 lbs. Discussed usage of the Babys[employer]s app for more information about pregnancy, and to track blood pressures. Also discussed usage of virtual visits as additional source of managing and completing prenatal visits.  Patient was encouraged to use MyChart to review results, send requests, and have questions addressed.   The nature of [facility] with multiple MDs and Advanced Practice Providers was explained to patient; also emphasized that residents, students are part of our team.  Also emphasized that we do have female providers; female-only providers cannot be guaranteed. Routine obstetric precautions reviewed. Encouraged to seek out care at our office or emergency room [facility] preferred) for urgent and/or emergent concerns. No follow-ups on file.   Nat Dauer, DNP, WHNP, IBCLC       [1]  Social History Tobacco Use   Smoking status: Every Day    Current packs/day: 0.50    Average packs/day: 0.5 packs/day for 7.0 years (3.5 ttl pk-yrs)    Types: Cigarettes   Smokeless tobacco: Never  Vaping Use   Vaping status: Never Used  Substance Use Topics    Alcohol use: Not Currently    Alcohol/week: 0.0 standard drinks of alcohol   Drug use: Not Currently    Types: Cocaine, Marijuana  [2]  Allergies Allergen Reactions   Amipaque [Metrizamide] Anaphylaxis   Azulfidine [Sulfasalazine] Hives   Ivp Dye [Iodinated Contrast Media] Anaphylaxis    Per patient, she does not have problems with betadine   Latex Rash   Ms Contin [Morphine] Other (See Comments)    Bradycardia    Mushroom Extract Complex (  Obsolete) Anaphylaxis, Hives, Shortness Of Breath, Swelling and Other (See Comments)    Throat swelling   Neurontin  [Gabapentin ] Hives   Tomato Anaphylaxis   Toradol [Ketorolac Tromethamine] Anaphylaxis, Hives and Swelling   Asa [Aspirin] Hives   Dilaudid  [Hydromorphone  Hcl] Hives   Lamictal [Lamotrigine] Hives   Sulfa Antibiotics Hives   Ultram [Tramadol] Hives, Swelling and Rash   Venofer  [Iron  Sucrose] Other (See Comments)    Chest pain Warm sensation Hypotension    Adhesive [Tape] Itching    Paper tape OK   Iohexol  Rash  [3]  Current Outpatient Medications on File Prior to Visit  Medication Sig Dispense Refill   CARBONYL IRON  PO Take 1 tablet by mouth daily.     dicyclomine (BENTYL) 20 MG tablet Take 20 mg by mouth every 8 (eight) hours as needed for spasms.     ferrous sulfate  325 (65 FE) MG tablet Take 325 mg by mouth daily.     levETIRAcetam  (KEPPRA ) 500 MG tablet Take 1 tablet (500 mg total) by mouth 2 (two) times daily. 60 tablet 2   metFORMIN  (GLUCOPHAGE ) 500 MG tablet Take 1 tablet (500 mg total) by mouth 2 (two) times daily with a meal. 60 tablet 1   pantoprazole (PROTONIX) 40 MG tablet Take 40 mg by mouth daily.     Prenatal Vit-Fe Fumarate-FA (MULTIVITAMIN-PRENATAL) 27-0.8 MG TABS tablet Take 1 tablet by mouth daily at 12 noon.     VENTOLIN  HFA 108 (90 Base) MCG/ACT inhaler Inhale 2 puffs into the lungs. Every 4-6 hours for wheezing, cough or shortness of breath     alprazolam  (XANAX ) 2 MG tablet Take 2 mg by mouth 4 (four)  times daily. (Patient not taking: Reported on 09/26/2024)     amphetamine -dextroamphetamine  (ADDERALL) 20 MG tablet Take 20 mg by mouth 2 (two) times daily. (Patient not taking: Reported on 09/06/2024)     DULoxetine (CYMBALTA) 30 MG capsule Take 30 mg by mouth daily. (Patient not taking: Reported on 09/06/2024)     traZODone  (DESYREL ) 50 MG tablet Take 100 mg by mouth at bedtime as needed for sleep. (Patient not taking: Reported on 09/06/2024)     No current facility-administered medications on file prior to visit.   "

## 2024-09-29 DIAGNOSIS — O09899 Supervision of other high risk pregnancies, unspecified trimester: Secondary | ICD-10-CM | POA: Insufficient documentation

## 2024-09-29 MED ORDER — BLOOD GLUCOSE TEST VI STRP: 1.0000 | ORAL_STRIP | 0 refills | Status: DC

## 2024-09-29 MED ORDER — ACCU-CHEK GUIDE W/DEVICE KIT: 1.0000 | PACK | Freq: Four times a day (QID) | 0 refills | Status: DC

## 2024-09-29 MED ORDER — ACCU-CHEK SOFTCLIX LANCETS MISC: 12 refills | Status: DC

## 2024-09-29 MED ORDER — FERROUS SULFATE 325 (65 FE) MG PO TABS: 325.0000 mg | ORAL_TABLET | Freq: Every day | ORAL | 0 refills | Status: DC

## 2024-09-29 MED ORDER — METFORMIN HCL 500 MG PO TABS: 500.0000 mg | ORAL_TABLET | Freq: Two times a day (BID) | ORAL | 2 refills | Status: AC

## 2024-10-02 ENCOUNTER — Ambulatory Visit: Payer: MEDICAID

## 2024-10-02 ENCOUNTER — Other Ambulatory Visit: Payer: Self-pay

## 2024-10-02 ENCOUNTER — Ambulatory Visit: Payer: MEDICAID | Attending: Maternal & Fetal Medicine

## 2024-10-02 DIAGNOSIS — O0990 Supervision of high risk pregnancy, unspecified, unspecified trimester: Secondary | ICD-10-CM

## 2024-10-02 DIAGNOSIS — O24912 Unspecified diabetes mellitus in pregnancy, second trimester: Secondary | ICD-10-CM

## 2024-10-02 DIAGNOSIS — Z98891 History of uterine scar from previous surgery: Secondary | ICD-10-CM

## 2024-10-03 ENCOUNTER — Ambulatory Visit: Payer: Self-pay | Admitting: Student

## 2024-10-03 DIAGNOSIS — Z8679 Personal history of other diseases of the circulatory system: Secondary | ICD-10-CM

## 2024-10-03 DIAGNOSIS — O24119 Pre-existing diabetes mellitus, type 2, in pregnancy, unspecified trimester: Secondary | ICD-10-CM

## 2024-10-03 DIAGNOSIS — O0990 Supervision of high risk pregnancy, unspecified, unspecified trimester: Secondary | ICD-10-CM

## 2024-10-04 ENCOUNTER — Encounter (HOSPITAL_COMMUNITY): Payer: Self-pay | Admitting: Obstetrics and Gynecology

## 2024-10-04 ENCOUNTER — Other Ambulatory Visit: Payer: Self-pay

## 2024-10-04 ENCOUNTER — Inpatient Hospital Stay (HOSPITAL_COMMUNITY)
Admission: AD | Admit: 2024-10-04 | Discharge: 2024-10-04 | Disposition: A | Discharge status: Home / Self Care | Payer: MEDICAID | Source: Home / Self Care | Attending: Obstetrics and Gynecology | Admitting: Obstetrics and Gynecology

## 2024-10-04 DIAGNOSIS — Z141 Cystic fibrosis carrier: Secondary | ICD-10-CM | POA: Insufficient documentation

## 2024-10-04 DIAGNOSIS — R109 Unspecified abdominal pain: Secondary | ICD-10-CM

## 2024-10-04 DIAGNOSIS — B9689 Other specified bacterial agents as the cause of diseases classified elsewhere: Secondary | ICD-10-CM | POA: Diagnosis not present

## 2024-10-04 DIAGNOSIS — O98812 Other maternal infectious and parasitic diseases complicating pregnancy, second trimester: Secondary | ICD-10-CM | POA: Diagnosis not present

## 2024-10-04 DIAGNOSIS — O34211 Maternal care for low transverse scar from previous cesarean delivery: Secondary | ICD-10-CM | POA: Insufficient documentation

## 2024-10-04 DIAGNOSIS — Z113 Encounter for screening for infections with a predominantly sexual mode of transmission: Secondary | ICD-10-CM | POA: Diagnosis present

## 2024-10-04 DIAGNOSIS — Z3A20 20 weeks gestation of pregnancy: Secondary | ICD-10-CM

## 2024-10-04 DIAGNOSIS — O23592 Infection of other part of genital tract in pregnancy, second trimester: Secondary | ICD-10-CM | POA: Diagnosis not present

## 2024-10-04 DIAGNOSIS — O26892 Other specified pregnancy related conditions, second trimester: Secondary | ICD-10-CM

## 2024-10-04 DIAGNOSIS — O23599 Infection of other part of genital tract in pregnancy, unspecified trimester: Secondary | ICD-10-CM

## 2024-10-04 DIAGNOSIS — O26899 Other specified pregnancy related conditions, unspecified trimester: Secondary | ICD-10-CM

## 2024-10-04 DIAGNOSIS — O09899 Supervision of other high risk pregnancies, unspecified trimester: Secondary | ICD-10-CM

## 2024-10-04 DIAGNOSIS — B3731 Acute candidiasis of vulva and vagina: Secondary | ICD-10-CM

## 2024-10-04 DIAGNOSIS — O4702 False labor before 37 completed weeks of gestation, second trimester: Secondary | ICD-10-CM | POA: Diagnosis present

## 2024-10-04 LAB — URINALYSIS, ROUTINE W REFLEX MICROSCOPIC
Bilirubin Urine: NEGATIVE
Glucose, UA: NEGATIVE mg/dL
Hgb urine dipstick: NEGATIVE
Ketones, ur: NEGATIVE mg/dL
Nitrite: NEGATIVE
Protein, ur: 30 mg/dL — AB
Specific Gravity, Urine: 1.023 (ref 1.005–1.030)
Squamous Epithelial / HPF: >50 /HPF (ref 0–5)
pH: 6 (ref 5.0–8.0)

## 2024-10-04 LAB — WET PREP, GENITAL
Sperm: NONE SEEN
Trich, Wet Prep: NONE SEEN
WBC, Wet Prep HPF POC: >=10 — AB

## 2024-10-04 MED ORDER — NYSTATIN 100000 UNIT/GM EX POWD: 1.0000 | Freq: Three times a day (TID) | CUTANEOUS | 0 refills | Status: AC

## 2024-10-04 MED ORDER — CYCLOBENZAPRINE HCL 10 MG PO TABS
10.0000 mg | ORAL_TABLET | Freq: Once | ORAL | Status: AC
  Administered 2024-10-04: 10 mg via ORAL
  Filled 2024-10-04: qty 1

## 2024-10-04 MED ORDER — NYSTATIN 100000 UNIT/GM EX POWD: Freq: Three times a day (TID) | CUTANEOUS | Status: DC | PRN

## 2024-10-04 MED ORDER — ACETAMINOPHEN 500 MG PO TABS
1000.0000 mg | ORAL_TABLET | Freq: Once | ORAL | Status: AC
  Administered 2024-10-04: 1000 mg via ORAL
  Filled 2024-10-04: qty 2

## 2024-10-04 MED ORDER — TERCONAZOLE 0.8 % VA CREA: 1.0000 | TOPICAL_CREAM | Freq: Every day | VAGINAL | 0 refills | Status: AC

## 2024-10-04 MED ORDER — METRONIDAZOLE 500 MG PO TABS: 500.0000 mg | ORAL_TABLET | Freq: Two times a day (BID) | ORAL | 0 refills | Status: DC

## 2024-10-04 NOTE — MAU Note (Signed)
 Faith [name] is a 33 y.o. at [redacted]w[redacted]d here in MAU reporting: contractions every 2 minutes starting around 9am; states contractions started yesterday. Denies vaginal bleeding and leaking of fluid; feeling baby move as expected for gestational age. Pain 9/10.  Onset of complaint: 10/03/24 Pain score: 9/10 intermittent abdominal pain with vaginal pressure Vitals:   10/04/24 1448 10/04/24 1450  BP: (!) 124/57   Pulse: 86   Resp: 16   Temp: 98.3 F (36.8 C)   SpO2:  96%     FHT: 157bpm

## 2024-10-04 NOTE — MAU Provider Note (Signed)
 Chief Complaint:  Contractions   HPI     Faith [name] is a 33 y.o. [id number] at [redacted]w[redacted]d who presents to maternity admissions reporting patient reports she was having some abdominal pain/contractions started around 9 AM.  She denies vaginal bleeding, leaking of fluid, reports that she does feel fetal movement.    P[facility]: [facility]   Past Medical History:  Diagnosis Date   Acute adjustment disorder with depressed mood 06/18/2023   ADHD    Anemia    BLOOD TRANSFUSION AFTER C-SECTION ON 06-2016-2 UNITS   Anxiety    Asthma    WELL CONTROLLED   B12 deficiency 06/24/2022   Bipolar 1 disorder, depressed (HCC)    COVID-19    Depression    Diabetes mellitus affecting pregnancy, unspecified trimester 09/19/2020   Diabetes mellitus without complication (HCC)    Gallstones    Gastroesophageal reflux disease 08/21/2010   GERD (gastroesophageal reflux disease)    Heart murmur    History of blood transfusion 06/2016   RECEIVED 2 UNITS OF BLOOD AFTER C SECTION   History of maternal blood transfusion, currently pregnant 04/19/2017   Malingering 05/03/2024   Menometrorrhagia 05/13/2022   Migraines    Ovarian cyst    Seizure disorder during pregnancy, third trimester (HCC) 05/10/2017   Seizures (HCC)    LAST SEIZURE January 2022   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   Smoking (tobacco) complicating pregnancy, unspecified trimester 05/10/2017   Substance induced mood disorder (HCC) 06/18/2023   Suicidal ideation 06/15/2013   Tubal pregnancy    OB History  Gravida Para Term Preterm AB Living  7 3 3  3 3   SAB IAB Ectopic Multiple Live Births  2 0 1  3    # Outcome Date GA Lbr Len/2nd Weight Sex Type Anes PTL Lv  7 Current           6 Term 03/27/21 [redacted]w[redacted]d  3900 g M CS-LTranv Spinal  LIV  5 Term 10/12/17 [redacted]w[redacted]d  4360 g F CS-LTranv Spinal  LIV  4 Term 06/22/16 [redacted]w[redacted]d  3830 g M CS-LTranv Spinal, EPI  LIV  3 SAB 2014 [redacted]w[redacted]d         2 SAB 2012 [redacted]w[redacted]d         1 Ectopic             Past Surgical History:  Procedure Laterality Date   CESAREAN SECTION N/A 06/22/2016   Procedure: CESAREAN SECTION;  Surgeon: Glory High, MD;  Location: [facility] ORS;  Service: Obstetrics;  Laterality: N/A;   CESAREAN SECTION N/A 10/12/2017   Procedure: CESAREAN SECTION;  Surgeon: High Glory, MD;  Location: [facility] ORS;  Service: Obstetrics;  Laterality: N/A;   CESAREAN SECTION N/A 03/27/2021   Procedure: CESAREAN SECTION;  Surgeon: Victor Claudell SAUNDERS, MD;  Location: [facility] OR[facility];  Service: Obstetrics;  Laterality: N/A;   CHOLECYSTECTOMY N/A 08/13/2016   Procedure: LAPAROSCOPIC CHOLECYSTECTOMY;  Surgeon: Laneta JULIANNA Luna, MD;  Location: [facility] ORS;  Service: General;  Laterality: N/A;   DILATION AND CURETTAGE OF UTERUS     ovarian cyst removed     SALPINGECTOMY Right 2013   ectopic pregnancy. PER PATIENT, STILL HAS BOTH TUBES   TONSILLECTOMY     TONSILLECTOMY AND ADENOIDECTOMY     Family History  Problem Relation Age of Onset   Heart disease Father    Huntington's disease Mother    Social History[1] Allergies[2] Medications Prior to Admission  Medication Sig Dispense Refill Last Dose/Taking   ferrous  sulfate 325 (65 FE) MG tablet Take 1 tablet (325 mg total) by mouth daily. 30 tablet 0 Past Week   levETIRAcetam  (KEPPRA ) 500 MG tablet Take 1 tablet (500 mg total) by mouth 2 (two) times daily. 60 tablet 2 10/03/2024   metFORMIN  (GLUCOPHAGE ) 500 MG tablet Take 1 tablet (500 mg total) by mouth 2 (two) times daily with a meal. 60 tablet 2 Past Week   pantoprazole (PROTONIX) 40 MG tablet Take 40 mg by mouth daily.   10/03/2024   Prenatal Vit-Fe Fumarate-FA (MULTIVITAMIN-PRENATAL) 27-0.8 MG TABS tablet Take 1 tablet by mouth daily at 12 noon.   10/03/2024   Accu-Chek Softclix Lancets lancets Use as instructed (Patient not taking: Reported on 10/02/2024) 100 each 12    Blood Glucose Monitoring Suppl (ACCU-CHEK GUIDE) w/Device KIT 1 Device by Does not apply route in the morning, at noon, in  the evening, and at bedtime. (Patient not taking: Reported on 10/02/2024) 1 kit 0    dicyclomine (BENTYL) 20 MG tablet Take 20 mg by mouth every 8 (eight) hours as needed for spasms. (Patient not taking: Reported on 10/04/2024)   Not Taking   Glucose Blood (BLOOD GLUCOSE TEST STRIPS) STRP 1 each by Does not apply route as directed. Dispense based on patient and insurance preference. Use up to four times daily as directed. (FOR ICD-10 E10.9, E11.9). (Patient not taking: Reported on 10/02/2024) 100 strip 0    VENTOLIN  HFA 108 (90 Base) MCG/ACT inhaler Inhale 2 puffs into the lungs. Every 4-6 hours for wheezing, cough or shortness of breath (Patient not taking: Reported on 10/04/2024)   Not Taking    I have reviewed patient's Past Medical Hx, Surgical Hx, Family Hx, Social Hx, medications and allergies.   ROS  Pertinent items noted in HPI and remainder of comprehensive ROS otherwise negative.   PHYSICAL EXAM  Patient Vitals for the past 24 hrs:  BP Temp Temp src Pulse Resp SpO2 Height Weight  10/04/24 1501 (!) 117/58 -- -- 87 -- -- -- --  10/04/24 1455 -- -- -- -- -- 96 % -- --  10/04/24 1450 -- -- -- -- -- 96 % -- --  10/04/24 1448 (!) 124/57 98.3 F (36.8 C) Oral 86 16 -- 5' (1.524 m) 106.1 kg    Constitutional: Well-developed, well-nourished female in no acute distress.  Cardiovascular: normal rate & rhythm, warm and well-perfused Respiratory: normal effort, no problems with respiration noted GI: Abd soft, non-tender, gravid MS: Extremities nontender, no edema, normal ROM Neurologic: Alert and oriented x 4.  GU: no CVA tenderness Pelvic: Pelvic exam chaperoned by Hunter Gallery RN  Vulvar is erythremic and edematous  Speculum exam reveals whitish to yellowish discharge, no blood in the vaginal vault , Vaginal cx obtained, Cervix is visually closed      Fetal Tracing: 157 via Doppler     Labs: Results for orders placed or performed during the hospital encounter of 10/04/24 (from  the past 24 hours)  Wet prep, genital     Status: Abnormal   Collection Time: 10/04/24  4:04 PM   Specimen: Vaginal  Result Value Ref Range   Yeast Wet Prep HPF POC PRESENT (A) NONE SEEN   Trich, Wet Prep NONE SEEN NONE SEEN   Clue Cells Wet Prep HPF POC PRESENT (A) NONE SEEN   WBC, Wet Prep HPF POC >=10 (A) <10   Sperm NONE SEEN   Urinalysis, Routine w reflex microscopic -Urine, Random     Status: Abnormal  Collection Time: 10/04/24  4:04 PM  Result Value Ref Range   Color, Urine AMBER (A) YELLOW   APPearance CLOUDY (A) CLEAR   Specific Gravity, Urine 1.023 1.005 - 1.030   pH 6.0 5.0 - 8.0   Glucose, UA NEGATIVE NEGATIVE mg/dL   Hgb urine dipstick NEGATIVE NEGATIVE   Bilirubin Urine NEGATIVE NEGATIVE   Ketones, ur NEGATIVE NEGATIVE mg/dL   Protein, ur 30 (A) NEGATIVE mg/dL   Nitrite NEGATIVE NEGATIVE   Leukocytes,Ua SMALL (A) NEGATIVE   RBC / HPF 0-5 0 - 5 RBC/hpf   WBC, UA 0-5 0 - 5 WBC/hpf   Bacteria, UA RARE (A) NONE SEEN   Squamous Epithelial / HPF >50 0 - 5 /HPF   Mucus PRESENT     Imaging:  No results found.  MDM & MAU COURSE  MDM:  MODERATE   Vaginal cultures obtained Wet prep consistent with yeast and BV Will plan to treat for both mixed vaginitis and pregnancy and nystatin  powder externally for vulvar candiasis   MAU Course: Orders Placed This Encounter  Procedures   Wet prep, genital   Culture, OB Urine   Urinalysis, Routine w reflex microscopic -Urine, Random   Discharge patient Discharge disposition: 01-Home or Self Care; Discharge patient date: 10/04/2024   Meds ordered this encounter  Medications   cyclobenzaprine  (FLEXERIL ) tablet 10 mg   acetaminophen  (TYLENOL ) tablet 1,000 mg   DISCONTD: nystatin  (MYCOSTATIN /NYSTOP ) topical powder   metroNIDAZOLE  (FLAGYL ) 500 MG tablet    Sig: Take 1 tablet (500 mg total) by mouth 2 (two) times daily.    Dispense:  14 tablet    Refill:  0    Supervising Provider:   PRATT, TANYA S [[id number]]   terconazole   (TERAZOL 3 ) 0.8 % vaginal cream    Sig: Place 1 applicator vaginally at bedtime.    Dispense:  20 g    Refill:  0    Supervising Provider:   PRATT, TANYA S [[id number]]   nystatin  (MYCOSTATIN /NYSTOP ) powder    Sig: Apply 1 Application topically 3 (three) times daily.    Dispense:  15 g    Refill:  0    Supervising Provider:   PRATT, TANYA S [[id number]]    I have reviewed the patient chart and performed the physical exam . 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 Vaginitis affecting pregnancy, antepartum   3. Abdominal pain affecting pregnancy   4. [redacted] weeks gestation of pregnancy   5. Candidal vulvitis     PLAN  Discharge home in stable condition with return precautions.   See AVS for full description of information given to the patient including both verbal and written. Patient verbalized understanding and agrees with the plan as described above.     Follow-up Information     [facility] Follow up.   Specialty: Obstetrics and Gynecology Why: If symptoms worsen or fail to resolve, As scheduled for ongoing prenatal care Contact information: [address] [place]  [address] [phone]                Allergies as of 10/04/2024       Reactions   Amipaque [metrizamide] Anaphylaxis   Azulfidine [sulfasalazine] Hives   Ivp Dye [iodinated Contrast Media] Anaphylaxis   Per patient, she does not have problems with betadine  Latex Rash   Ms Contin [morphine] Other (See Comments)   Bradycardia   Mushroom Extract Complex (obsolete) Anaphylaxis, Hives, Shortness Of Breath, Swelling, Other (See Comments)   Throat swelling   Neurontin  [gabapentin ] Hives   Tomato Anaphylaxis   Toradol [ketorolac Tromethamine] Anaphylaxis, Hives, Swelling   Asa [aspirin] Hives   Dilaudid  [hydromorphone  Hcl] Hives    Lamictal [lamotrigine] Hives   Sulfa Antibiotics Hives   Ultram [tramadol] Hives, Swelling, Rash   Venofer  [iron  Sucrose] Other (See Comments)   Chest pain Warm sensation Hypotension    Adhesive [tape] Itching   Paper tape OK   Iohexol  Rash        Medication List     TAKE these medications    Accu-Chek Guide w/Device Kit 1 Device by Does not apply route in the morning, at noon, in the evening, and at bedtime.   Accu-Chek Softclix Lancets lancets Use as instructed   BLOOD GLUCOSE TEST STRIPS Strp 1 each by Does not apply route as directed. Dispense based on patient and insurance preference. Use up to four times daily as directed. (FOR ICD-10 E10.9, E11.9).   dicyclomine 20 MG tablet Commonly known as: BENTYL Take 20 mg by mouth every 8 (eight) hours as needed for spasms.   ferrous sulfate  325 (65 FE) MG tablet Take 1 tablet (325 mg total) by mouth daily.   levETIRAcetam  500 MG tablet Commonly known as: Keppra  Take 1 tablet (500 mg total) by mouth 2 (two) times daily.   metFORMIN  500 MG tablet Commonly known as: GLUCOPHAGE  Take 1 tablet (500 mg total) by mouth 2 (two) times daily with a meal.   metroNIDAZOLE  500 MG tablet Commonly known as: FLAGYL  Take 1 tablet (500 mg total) by mouth 2 (two) times daily.   multivitamin-prenatal 27-0.8 MG Tabs tablet Take 1 tablet by mouth daily at 12 noon.   nystatin  powder Commonly known as: MYCOSTATIN /NYSTOP  Apply 1 Application topically 3 (three) times daily.   pantoprazole 40 MG tablet Commonly known as: PROTONIX Take 40 mg by mouth daily.   terconazole  0.8 % vaginal cream Commonly known as: TERAZOL 3  Place 1 applicator vaginally at bedtime.   Ventolin  HFA 108 (90 Base) MCG/ACT inhaler Generic drug: albuterol  Inhale 2 puffs into the lungs. Every 4-6 hours for wheezing, cough or shortness of breath        Olam Dalton, MSN, WHNP-BC [facility], [facility] [employer]       [1]   Social History Tobacco Use   Smoking status: Every Day    Current packs/day: 0.50    Average packs/day: 0.5 packs/day for 7.0 years (3.5 ttl pk-yrs)    Types: Cigarettes   Smokeless tobacco: Never  Vaping Use   Vaping status: Never Used  Substance Use Topics   Alcohol use: Not Currently    Alcohol/week: 0.0 standard drinks of alcohol   Drug use: Not Currently    Types: Cocaine, Marijuana  [2]  Allergies Allergen Reactions   Amipaque [Metrizamide] Anaphylaxis   Azulfidine [Sulfasalazine] Hives   Ivp Dye [Iodinated Contrast Media] Anaphylaxis    Per patient, she does not have problems with betadine   Latex Rash   Ms Contin [Morphine] Other (See Comments)    Bradycardia    Mushroom Extract Complex (Obsolete) Anaphylaxis, Hives, Shortness Of Breath, Swelling and Other (See Comments)    Throat swelling   Neurontin  [Gabapentin ] Hives   Tomato Anaphylaxis   Toradol [Ketorolac Tromethamine] Anaphylaxis, Hives and Swelling   Asa [Aspirin]  Hives   Dilaudid  [Hydromorphone  Hcl] Hives   Lamictal [Lamotrigine] Hives   Sulfa Antibiotics Hives   Ultram [Tramadol] Hives, Swelling and Rash   Venofer  [Iron  Sucrose] Other (See Comments)    Chest pain Warm sensation Hypotension    Adhesive [Tape] Itching    Paper tape OK   Iohexol  Rash

## 2024-10-06 LAB — GC/CHLAMYDIA PROBE AMP (~~LOC~~) NOT AT ARMC
Chlamydia: NEGATIVE
Comment: NEGATIVE
Comment: NORMAL
Neisseria Gonorrhea: NEGATIVE

## 2024-10-07 ENCOUNTER — Other Ambulatory Visit: Payer: Self-pay

## 2024-10-07 ENCOUNTER — Emergency Department (HOSPITAL_COMMUNITY): Admission: EM | Admit: 2024-10-07 | Discharge: 2024-10-08 | Disposition: A | Payer: MEDICAID

## 2024-10-07 DIAGNOSIS — R112 Nausea with vomiting, unspecified: Secondary | ICD-10-CM | POA: Insufficient documentation

## 2024-10-07 DIAGNOSIS — H9202 Otalgia, left ear: Secondary | ICD-10-CM | POA: Insufficient documentation

## 2024-10-07 DIAGNOSIS — J45909 Unspecified asthma, uncomplicated: Secondary | ICD-10-CM | POA: Insufficient documentation

## 2024-10-07 DIAGNOSIS — R569 Unspecified convulsions: Secondary | ICD-10-CM | POA: Diagnosis not present

## 2024-10-07 DIAGNOSIS — O219 Vomiting of pregnancy, unspecified: Secondary | ICD-10-CM | POA: Diagnosis not present

## 2024-10-07 DIAGNOSIS — Z7984 Long term (current) use of oral hypoglycemic drugs: Secondary | ICD-10-CM | POA: Insufficient documentation

## 2024-10-07 DIAGNOSIS — O26892 Other specified pregnancy related conditions, second trimester: Secondary | ICD-10-CM | POA: Diagnosis present

## 2024-10-07 DIAGNOSIS — Z7951 Long term (current) use of inhaled steroids: Secondary | ICD-10-CM | POA: Diagnosis not present

## 2024-10-07 DIAGNOSIS — E119 Type 2 diabetes mellitus without complications: Secondary | ICD-10-CM | POA: Insufficient documentation

## 2024-10-07 DIAGNOSIS — Z3A21 21 weeks gestation of pregnancy: Secondary | ICD-10-CM

## 2024-10-07 DIAGNOSIS — Z91040 Latex allergy status: Secondary | ICD-10-CM | POA: Diagnosis not present

## 2024-10-07 LAB — BASIC METABOLIC PANEL WITH GFR
Anion gap: 10 (ref 5–15)
BUN: 7 mg/dL (ref 6–20)
CO2: 20 mmol/L — ABNORMAL LOW (ref 22–32)
Calcium: 8 mg/dL — ABNORMAL LOW (ref 8.9–10.3)
Chloride: 108 mmol/L (ref 98–111)
Creatinine, Ser: 0.37 mg/dL — ABNORMAL LOW (ref 0.44–1.00)
GFR, Estimated: >60 mL/min
Glucose, Bld: 93 mg/dL (ref 70–99)
Potassium: 3.6 mmol/L (ref 3.5–5.1)
Sodium: 138 mmol/L (ref 135–145)

## 2024-10-07 LAB — CBC WITH DIFFERENTIAL/PLATELET
Abs Immature Granulocytes: 0.15 K/uL — ABNORMAL HIGH (ref 0.00–0.07)
Basophils Absolute: 0 K/uL (ref 0.0–0.1)
Basophils Relative: 0 %
Eosinophils Absolute: 0.2 K/uL (ref 0.0–0.5)
Eosinophils Relative: 1 %
HCT: 31.9 % — ABNORMAL LOW (ref 36.0–46.0)
Hemoglobin: 9.4 g/dL — ABNORMAL LOW (ref 12.0–15.0)
Immature Granulocytes: 1 %
Lymphocytes Relative: 15 %
Lymphs Abs: 2 K/uL (ref 0.7–4.0)
MCH: 20.7 pg — ABNORMAL LOW (ref 26.0–34.0)
MCHC: 29.5 g/dL — ABNORMAL LOW (ref 30.0–36.0)
MCV: 70.3 fL — ABNORMAL LOW (ref 80.0–100.0)
Monocytes Absolute: 0.7 K/uL (ref 0.1–1.0)
Monocytes Relative: 5 %
Neutro Abs: 10.6 K/uL — ABNORMAL HIGH (ref 1.7–7.7)
Neutrophils Relative %: 78 %
Platelets: 315 K/uL (ref 150–400)
RBC: 4.54 MIL/uL (ref 3.87–5.11)
RDW: 19.9 % — ABNORMAL HIGH (ref 11.5–15.5)
WBC: 13.6 K/uL — ABNORMAL HIGH (ref 4.0–10.5)
nRBC: 0 % (ref 0.0–0.2)

## 2024-10-07 LAB — CULTURE, OB URINE: Culture: 20000 — AB

## 2024-10-07 MED ORDER — LEVETIRACETAM (KEPPRA) 500 MG/5 ML ADULT IV PUSH
1000.0000 mg | Freq: Once | INTRAVENOUS | Status: AC
  Administered 2024-10-07: 1000 mg via INTRAVENOUS
  Filled 2024-10-07: qty 10

## 2024-10-07 MED ORDER — SCOPOLAMINE 1 MG/3DAYS TD PT72: 1.0000 | MEDICATED_PATCH | TRANSDERMAL | 3 refills | Status: AC

## 2024-10-07 MED ORDER — ONDANSETRON 4 MG PO TBDP: 4.0000 mg | ORAL_TABLET | Freq: Three times a day (TID) | ORAL | 2 refills | Status: AC | PRN

## 2024-10-07 NOTE — ED Provider Notes (Signed)
 " [facility] Provider Note   CSN: [id number] Arrival date & time: 10/07/24  2236     Patient presents with: Seizures   Faith [name] is a 34 y.o. female.  {Add pertinent medical, surgical, social history, OB history to YEP:67052} The history is provided by the patient and medical records.  Seizures  34 year old female with history of diabetes, asthma, anemia, GERD, bipolar disorder, substance-induced mood disorder, anxiety, presenting to the ED after seizure.  Patient is [redacted] weeks gestation.  Longstanding history of seizures, has been having them frequently recently as she reports she is not holding down her Keppra  due to continued nausea and vomiting throughout her pregnancy.  She did try to take her meds this morning but vomited them up.  Sounds like seizure was prolonged at home approximately 15 to 20 minutes.  She is awake, alert, oriented on arrival to the ED.  States she just feels very drowsy right now.  States throughout pregnancy she has been having issues with her blood pressure being low so she has been checking it at home.  Denies cough, fever but did report some left ear pain.  Prior to Admission medications  Medication Sig Start Date End Date Taking? Authorizing Provider  Accu-Chek Softclix Lancets lancets Use as instructed Patient not taking: Reported on 10/02/2024 09/29/24   Forlan, Nicole, NP  Blood Glucose Monitoring Suppl (ACCU-CHEK GUIDE) w/Device KIT 1 Device by Does not apply route in the morning, at noon, in the evening, and at bedtime. Patient not taking: Reported on 10/02/2024 09/29/24   Forlan, Nicole, NP  dicyclomine (BENTYL) 20 MG tablet Take 20 mg by mouth every 8 (eight) hours as needed for spasms. Patient not taking: Reported on 10/04/2024    [provider]  ferrous sulfate  325 (65 FE) MG tablet Take 1 tablet (325 mg total) by mouth daily. 09/29/24 10/29/24  Hoyle Garre, NP  Glucose Blood (BLOOD GLUCOSE  TEST STRIPS) STRP 1 each by Does not apply route as directed. Dispense based on patient and insurance preference. Use up to four times daily as directed. (FOR ICD-10 E10.9, E11.9). Patient not taking: Reported on 10/02/2024 09/29/24   Forlan, Nicole, NP  levETIRAcetam  (KEPPRA ) 500 MG tablet Take 1 tablet (500 mg total) by mouth 2 (two) times daily. 09/18/24   Arlander Charleston, MD  metFORMIN  (GLUCOPHAGE ) 500 MG tablet Take 1 tablet (500 mg total) by mouth 2 (two) times daily with a meal. 09/29/24 12/28/24  Hoyle Garre, NP  metroNIDAZOLE  (FLAGYL ) 500 MG tablet Take 1 tablet (500 mg total) by mouth 2 (two) times daily. 10/04/24   Cooleen, Olam LABOR, NP  nystatin  (MYCOSTATIN /NYSTOP ) powder Apply 1 Application topically 3 (three) times daily. 10/04/24   Cooleen, Olam LABOR, NP  pantoprazole (PROTONIX) 40 MG tablet Take 40 mg by mouth daily. 06/02/23   [provider]  Prenatal Vit-Fe Fumarate-FA (MULTIVITAMIN-PRENATAL) 27-0.8 MG TABS tablet Take 1 tablet by mouth daily at 12 noon.    [provider]  terconazole  (TERAZOL 3 ) 0.8 % vaginal cream Place 1 applicator vaginally at bedtime. 10/04/24   Cooleen, Olam LABOR, NP  VENTOLIN  HFA 108 (90 Base) MCG/ACT inhaler Inhale 2 puffs into the lungs. Every 4-6 hours for wheezing, cough or shortness of breath Patient not taking: Reported on 10/04/2024 11/09/23   [provider]    Allergies: Amipaque [metrizamide], Azulfidine [sulfasalazine], Ivp dye [iodinated contrast media], Latex, Ms contin [morphine], Mushroom extract complex (obsolete), Neurontin  [gabapentin ], Tomato, Toradol [ketorolac tromethamine],  Asa [aspirin], Dilaudid  [hydromorphone  hcl], Lamictal [lamotrigine], Sulfa antibiotics, Ultram [tramadol], Venofer  [iron  sucrose], Adhesive [tape], and Iohexol     Review of Systems  HENT:  Positive for ear pain.   Neurological:  Positive for seizures.  All other systems reviewed and are negative.   Updated Vital Signs BP (!) 124/53   Pulse 79    Temp 98.6 F (37 C) (Oral)   Resp (!) 22   SpO2 97%   Physical Exam Vitals and nursing note reviewed.  Constitutional:      Appearance: She is well-developed.  HENT:     Head: Normocephalic and atraumatic.     Ears:     Comments: EAC's clear without signs of infection or TM perforation    Mouth/Throat:     Comments: No tongue or dental injury noted Eyes:     Conjunctiva/sclera: Conjunctivae normal.     Pupils: Pupils are equal, round, and reactive to light.  Cardiovascular:     Rate and Rhythm: Normal rate and regular rhythm.     Heart sounds: Normal heart sounds.  Pulmonary:     Effort: Pulmonary effort is normal.     Breath sounds: Normal breath sounds.  Abdominal:     General: Bowel sounds are normal.     Palpations: Abdomen is soft.  Musculoskeletal:        General: Normal range of motion.     Cervical back: Normal range of motion.  Skin:    General: Skin is warm and dry.  Neurological:     Mental Status: She is alert and oriented to person, place, and time.     (all labs ordered are listed, but only abnormal results are displayed) Labs Reviewed - No data to display  EKG: None  Radiology: No results found.  {Document cardiac monitor, telemetry assessment procedure when appropriate:32947} Procedures   Medications Ordered in the ED  levETIRAcetam  (KEPPRA ) undiluted injection 1,000 mg (has no administration in time range)      {Click here for ABCD2, HEART and other calculators REFRESH Note before signing:1}                              Medical Decision Making Amount and/or Complexity of Data Reviewed Labs: ordered. ECG/medicine tests: ordered.   ***  {Document critical care time when appropriate  Document review of labs and clinical decision tools ie CHADS2VASC2, etc  Document your independent review of radiology images and any outside records  Document your discussion with family members, caretakers and with consultants  Document social  determinants of health affecting pt's care  Document your decision making why or why not admission, treatments were needed:32947:::1}   Final diagnoses:  None    ED Discharge Orders     None        "

## 2024-10-07 NOTE — ED Triage Notes (Signed)
 Patient brought in by [facility] for seizures. Patient is [redacted] weeks pregnant. EMS states husband called when he was unable to help patient recover from seizure at home. Patient is drowsy but oriented x4 on arrival and able to answer all questions. Patient states that she has been nauseated at home with this pregnancy and has been throwing up her keppra .

## 2024-10-07 NOTE — Progress Notes (Signed)
 Patient ID: [name], female   DOB: 01/27/1991, 34 y.o.   MRN: [id number]   OB/GYN Telephone Consult  10/07/2024   Faith [name] is a 34 y.o. H2E6966 who is currently pregnant presenting to [facility].   I was called for a consult regarding the care of this patient by the RROB RN caring for the patient.   The provider had the following clinical question: N/V contributing to poor seizure med compliance  The provider presented the following relevant clinical information: Several seizures recently  I performed a chart review on the patient and reviewed available documentation.  BP (!) 124/53   Pulse 79   Temp 98.6 F (37 C) (Oral)   Resp (!) 22   SpO2 97%   Exam- performed by consulting provider   Recommendations:  -Rx for Scopolamine  patch  -rx for Zofran  -f/u for prenatal care.   If additional recommendations are needed please call [phone] for the OB/GYN attending on service at [facility].   I spent approximately 10 minutes directly consulting with the RROB RN  and verbally discussing this case. Additionally 10 minutes minutes was spent performing chart review and documentation.    Glenys GORMAN Birk, MD

## 2024-10-08 MED ORDER — SCOPOLAMINE 1 MG/3DAYS TD PT72: 1.0000 | MEDICATED_PATCH | TRANSDERMAL | 0 refills | Status: DC

## 2024-10-08 MED ORDER — MONISTAT 1 6.5 % VA OINT: 1.0000 | TOPICAL_OINTMENT | Freq: Once | VAGINAL | 0 refills | Status: AC

## 2024-10-08 MED ORDER — ONDANSETRON 4 MG PO TBDP: 4.0000 mg | ORAL_TABLET | Freq: Three times a day (TID) | ORAL | 0 refills | Status: DC | PRN

## 2024-10-08 MED ORDER — LEVETIRACETAM 500 MG PO TABS: 500.0000 mg | ORAL_TABLET | Freq: Two times a day (BID) | ORAL | 0 refills | Status: AC

## 2024-10-08 NOTE — Progress Notes (Signed)
 RROB RN called by [facility] RN regarding pregnant patient incoming via EMS for seizure.  Upon arrival to ED, patient was A/Ox4.  G7P3, [redacted]w[redacted]d, previous C/Sx3, history including epilepsy.  Patient reports inconsistent use of Keppra  due to nausea/vomiting.  Patient denies uterine contractions, vaginal leaking or bleeding.  EFHM applied, range 148-157.  Attending OB notified.  RROB RN used teach-back method to inform patient about medications ordered by attending OB.  Patient will follow-up at next scheduled prenatal visit.

## 2024-10-08 NOTE — ED Notes (Signed)
 Patient given discharge, follow-up, and medication instructions. Patient verbalized understanding. Patient left via wheelchair to private vehicle.

## 2024-10-08 NOTE — Discharge Instructions (Addendum)
 I have refilled your keppra  and sent in nausea medication as recommended OB.  Please make sure to take your medication as scheduled. I have also sent in cream for yeast infection. Follow-up with OB for ongoing prenatal care. Return here for new concerns.

## 2024-10-08 NOTE — ED Notes (Signed)
 Patient lying in bed with eyes closed resting, chest rise and fall noted.

## 2024-10-08 NOTE — ED Notes (Signed)
 Pt ambulated to restroom and back to room with one person assist. Pt felt dizzy and there was an obvious sway in the pt's gait.

## 2024-10-09 ENCOUNTER — Other Ambulatory Visit: Payer: Self-pay

## 2024-10-09 ENCOUNTER — Emergency Department
Admission: EM | Admit: 2024-10-09 | Discharge: 2024-10-09 | Disposition: A | Discharge status: Home / Self Care | Payer: MEDICAID | Attending: Emergency Medicine | Admitting: Emergency Medicine

## 2024-10-09 DIAGNOSIS — O99512 Diseases of the respiratory system complicating pregnancy, second trimester: Secondary | ICD-10-CM | POA: Diagnosis not present

## 2024-10-09 DIAGNOSIS — E119 Type 2 diabetes mellitus without complications: Secondary | ICD-10-CM | POA: Diagnosis not present

## 2024-10-09 DIAGNOSIS — O26892 Other specified pregnancy related conditions, second trimester: Secondary | ICD-10-CM | POA: Diagnosis present

## 2024-10-09 DIAGNOSIS — F17200 Nicotine dependence, unspecified, uncomplicated: Secondary | ICD-10-CM | POA: Insufficient documentation

## 2024-10-09 DIAGNOSIS — F32A Depression, unspecified: Secondary | ICD-10-CM | POA: Diagnosis not present

## 2024-10-09 DIAGNOSIS — O24312 Unspecified pre-existing diabetes mellitus in pregnancy, second trimester: Secondary | ICD-10-CM | POA: Insufficient documentation

## 2024-10-09 DIAGNOSIS — Z3A21 21 weeks gestation of pregnancy: Secondary | ICD-10-CM | POA: Insufficient documentation

## 2024-10-09 DIAGNOSIS — J45909 Unspecified asthma, uncomplicated: Secondary | ICD-10-CM | POA: Diagnosis not present

## 2024-10-09 DIAGNOSIS — O99342 Other mental disorders complicating pregnancy, second trimester: Secondary | ICD-10-CM | POA: Diagnosis not present

## 2024-10-09 DIAGNOSIS — O99332 Smoking (tobacco) complicating pregnancy, second trimester: Secondary | ICD-10-CM | POA: Diagnosis not present

## 2024-10-09 DIAGNOSIS — Z760 Encounter for issue of repeat prescription: Secondary | ICD-10-CM | POA: Diagnosis not present

## 2024-10-09 DIAGNOSIS — Z7984 Long term (current) use of oral hypoglycemic drugs: Secondary | ICD-10-CM | POA: Insufficient documentation

## 2024-10-09 LAB — LEVETIRACETAM LEVEL: Levetiracetam Lvl: <2 ug/mL — ABNORMAL LOW (ref 10.0–40.0)

## 2024-10-09 NOTE — ED Notes (Signed)
 Pt was dropped off in the parking lot via [employer] from [facility]. [employer] did not come inside.

## 2024-10-09 NOTE — Discharge Instructions (Addendum)
 I recommend follow-up with your primary care doctor or OB/GYN to discuss options for management for your depression.  This is often something that a primary care doctor or OB/GYN can manage but I have also provided you with resources for counseling/psychiatry.  If you ever have thoughts of wanting to harm yourself, harm someone else, have hallucinations, please return to the emergency department.

## 2024-10-09 NOTE — ED Triage Notes (Signed)
 FHT 165  No OB complaints in triage.

## 2024-10-09 NOTE — ED Triage Notes (Signed)
 Pt is followed by [facility]. Last appt 1 month ago. Provider retired and has been unable to find a new provider. Reports she needs her medications refilled. Denies SI/HI.  Per note pt is currently 108w1d pregnant. G4P3. Followed by [facility] - sts she's a high risk pregnancy s/t HTN. Next appt Jan 22.

## 2024-10-09 NOTE — ED Provider Notes (Signed)
 "  [facility] Provider Note    Event Date/Time   First MD Initiated Contact with Patient 10/09/24 0259     (approximate)   History   Medication Refill   HPI  Faith [name] is a 34 y.o. female with history of depression, ADHD, bipolar disorder, seizures, diabetes, substance abuse who presents to the emergency department requesting a new medication for her depression.  States medications she has taken previously do not work for her.  She is currently [redacted] weeks pregnant.  She has an appointment with her OB/GYN in the next 2 to 3 weeks.  She does not have a recruitment consultant or psychiatrist.  She denies any SI, HI or hallucinations.  She has no other acute complaints at this time.   History provided by patient.    Past Medical History:  Diagnosis Date   Acute adjustment disorder with depressed mood 06/18/2023   ADHD    Anemia    BLOOD TRANSFUSION AFTER C-SECTION ON 06-2016-2 UNITS   Anxiety    Asthma    WELL CONTROLLED   B12 deficiency 06/24/2022   Bipolar 1 disorder, depressed (HCC)    COVID-19    Depression    Diabetes mellitus affecting pregnancy, unspecified trimester 09/19/2020   Diabetes mellitus without complication (HCC)    Gallstones    Gastroesophageal reflux disease 08/21/2010   GERD (gastroesophageal reflux disease)    Heart murmur    History of blood transfusion 06/2016   RECEIVED 2 UNITS OF BLOOD AFTER C SECTION   History of maternal blood transfusion, currently pregnant 04/19/2017   Malingering 05/03/2024   Menometrorrhagia 05/13/2022   Migraines    Ovarian cyst    Seizure disorder during pregnancy, third trimester (HCC) 05/10/2017   Seizures (HCC)    LAST SEIZURE January 2022   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   Smoking (tobacco) complicating pregnancy, unspecified trimester 05/10/2017   Substance induced mood disorder (HCC) 06/18/2023   Suicidal ideation 06/15/2013   Tubal pregnancy     Past Surgical History:   Procedure Laterality Date   CESAREAN SECTION N/A 06/22/2016   Procedure: CESAREAN SECTION;  Surgeon: Glory High, MD;  Location: [facility] ORS;  Service: Obstetrics;  Laterality: N/A;   CESAREAN SECTION N/A 10/12/2017   Procedure: CESAREAN SECTION;  Surgeon: High Glory, MD;  Location: [facility] ORS;  Service: Obstetrics;  Laterality: N/A;   CESAREAN SECTION N/A 03/27/2021   Procedure: CESAREAN SECTION;  Surgeon: Victor Claudell SAUNDERS, MD;  Location: [facility] ORS;  Service: Obstetrics;  Laterality: N/A;   CHOLECYSTECTOMY N/A 08/13/2016   Procedure: LAPAROSCOPIC CHOLECYSTECTOMY;  Surgeon: Laneta JULIANNA Luna, MD;  Location: [facility] ORS;  Service: General;  Laterality: N/A;   DILATION AND CURETTAGE OF UTERUS     ovarian cyst removed     SALPINGECTOMY Right 2013   ectopic pregnancy. PER PATIENT, STILL HAS BOTH TUBES   TONSILLECTOMY     TONSILLECTOMY AND ADENOIDECTOMY      MEDICATIONS:  Prior to Admission medications  Medication Sig Start Date End Date Taking? Authorizing Provider  Accu-Chek Softclix Lancets lancets Use as instructed Patient not taking: Reported on 10/02/2024 09/29/24   Forlan, Nicole, NP  Blood Glucose Monitoring Suppl (ACCU-CHEK GUIDE) w/Device KIT 1 Device by Does not apply route in the morning, at noon, in the evening, and at bedtime. Patient not taking: Reported on 10/02/2024 09/29/24   Forlan, Nicole, NP  dicyclomine (BENTYL) 20 MG tablet Take 20 mg by mouth every 8 (eight) hours as needed  for spasms. Patient not taking: Reported on 10/04/2024    [provider]  ferrous sulfate  325 (65 FE) MG tablet Take 1 tablet (325 mg total) by mouth daily. 09/29/24 10/29/24  Hoyle Garre, NP  Glucose Blood (BLOOD GLUCOSE TEST STRIPS) STRP 1 each by Does not apply route as directed. Dispense based on patient and insurance preference. Use up to four times daily as directed. (FOR ICD-10 E10.9, E11.9). Patient not taking: Reported on 10/02/2024 09/29/24   Forlan, Nicole, NP  levETIRAcetam   (KEPPRA ) 500 MG tablet Take 1 tablet (500 mg total) by mouth 2 (two) times daily. 10/08/24   Jarold Olam HERO, PA-C  metFORMIN  (GLUCOPHAGE ) 500 MG tablet Take 1 tablet (500 mg total) by mouth 2 (two) times daily with a meal. 09/29/24 12/28/24  Hoyle Garre, NP  metroNIDAZOLE  (FLAGYL ) 500 MG tablet Take 1 tablet (500 mg total) by mouth 2 (two) times daily. 10/04/24   Cooleen, Olam LABOR, NP  nystatin  (MYCOSTATIN /NYSTOP ) powder Apply 1 Application topically 3 (three) times daily. 10/04/24   Cooleen, Olam LABOR, NP  ondansetron  (ZOFRAN -ODT) 4 MG disintegrating tablet Take 1 tablet (4 mg total) by mouth every 8 (eight) hours as needed for nausea. 10/07/24   Fredirick Glenys RAMAN, MD  pantoprazole (PROTONIX) 40 MG tablet Take 40 mg by mouth daily. 06/02/23   [provider]  Prenatal Vit-Fe Fumarate-FA (MULTIVITAMIN-PRENATAL) 27-0.8 MG TABS tablet Take 1 tablet by mouth daily at 12 noon.    [provider]  scopolamine  (TRANSDERM-SCOP) 1 MG/3DAYS Place 1 patch (1 mg total) onto the skin every 3 (three) days. 10/07/24   Fredirick Glenys RAMAN, MD  terconazole  (TERAZOL 3 ) 0.8 % vaginal cream Place 1 applicator vaginally at bedtime. 10/04/24   Cooleen, Olam LABOR, NP  VENTOLIN  HFA 108 (90 Base) MCG/ACT inhaler Inhale 2 puffs into the lungs. Every 4-6 hours for wheezing, cough or shortness of breath Patient not taking: Reported on 10/04/2024 11/09/23   [provider]    Physical Exam   Triage Vital Signs: ED Triage Vitals  Encounter Vitals Group     BP 10/09/24 0223 121/68     Girls Systolic BP Percentile --      Girls Diastolic BP Percentile --      Boys Systolic BP Percentile --      Boys Diastolic BP Percentile --      Pulse Rate 10/09/24 0223 91     Resp 10/09/24 0223 16     Temp 10/09/24 0223 97.9 F (36.6 C)     Temp Source 10/09/24 0223 Oral     SpO2 10/09/24 0223 99 %     Weight --      Height --      Head Circumference --      Peak Flow --      Pain Score 10/09/24 0220 0     Pain Loc --       Pain Education --      Exclude from Growth Chart --     Most recent vital signs: Vitals:   10/09/24 0223  BP: 121/68  Pulse: 91  Resp: 16  Temp: 97.9 F (36.6 C)  SpO2: 99%     CONSTITUTIONAL: Alert and responds appropriately to questions. Well-appearing; well-nourished HEAD: Normocephalic, atraumatic EYES: Conjunctivae clear, pupils appear equal ENT: normal nose; moist mucous membranes NECK: Normal range of motion CARD: Regular rate and rhythm RESP: Normal chest excursion without splinting or tachypnea; no hypoxia or respiratory distress, speaking full sentences ABD/GI: non-distended EXT:  Normal ROM in all joints, no major deformities noted SKIN: Normal color for age and race, no rashes on exposed skin NEURO: Moves all extremities equally, normal speech, no facial asymmetry noted PSYCH: The patient's mood and manner are appropriate. Grooming and personal hygiene are appropriate.  No SI, HI or hallucinations.  ED Results / Procedures / Treatments   LABS: (all labs ordered are listed, but only abnormal results are displayed) Labs Reviewed - No data to display   EKG:  EKG Interpretation Date/Time:    Ventricular Rate:    PR Interval:    QRS Duration:    QT Interval:    QTC Calculation:   R Axis:      Text Interpretation:            RADIOLOGY: My personal review and interpretation of imaging:    I have personally reviewed all radiology reports. No results found.   PROCEDURES:  Critical Care performed: No      Procedures    IMPRESSION / MDM / ASSESSMENT AND PLAN / ED COURSE  I reviewed the triage vital signs and the nursing notes.   Patient here requesting a medication to help with her depression.  Currently pregnant.  No SI, HI, psychosis.     DIFFERENTIAL DIAGNOSIS (includes but not limited to):   Depression, no signs of psychosis, no SI or HI  Patient's presentation is most consistent with acute, uncomplicated illness.  PLAN:  Patient here requesting medications for depression.  No emergent psychiatric condition present at this time.  No indication for psychiatric consultation from the emergency department.  It appears patient does have a primary care doctor and she also has an appointment to see her OB/GYN in the next 2 to 3 weeks.  Have recommended that she start with those providers first to discuss management of her chronic depression especially given she is pregnant.  Discussed with her that they would be the most appropriate people to manage this long-term given there is no emergent condition present at this time.  She contracts for safety today.  I have recommended she return to the emergency department if she develops SI, HI, psychosis or severe worsening of her depression.  She verbalized understanding.  I have given her outpatient resources for counseling and psychiatry as well but discussed with her that her primary care doctor or OB/GYN would be the first place that I would start to manage the symptoms.  Patient saw OB/GYN on 09/26/2024.  They placed a referral to integrated behavioral health at that time.   MEDICATIONS GIVEN IN ED: Medications - No data to display   ED COURSE:  At this time, I do not feel there is any life-threatening condition present. I reviewed all nursing notes, vitals, pertinent previous records.  All lab and urine results, EKGs, imaging ordered have been independently reviewed and interpreted by myself.  I reviewed all available radiology reports from any imaging ordered this visit.  Based on my assessment, I feel the patient is safe to be discharged home without further emergent workup and can continue workup as an outpatient as needed. Discussed all findings, treatment plan as well as usual and customary return precautions.  They verbalize understanding and are comfortable with this plan.  Outpatient follow-up has been provided as needed.  All questions have been answered.    CONSULTS:   none   OUTSIDE RECORDS REVIEWED: Reviewed recent OB/GYN notes.     FINAL CLINICAL IMPRESSION(S) / ED DIAGNOSES   Final diagnoses:  Depression during pregnancy in second trimester     Rx / DC Orders   ED Discharge Orders     None        Note:  This document was prepared using Dragon voice recognition software and may include unintentional dictation errors.   Erhard Senske, Josette SAILOR, DO 10/09/24 [phone]  "

## 2024-10-11 ENCOUNTER — Ambulatory Visit: Payer: Self-pay | Admitting: Nurse Practitioner

## 2024-10-11 ENCOUNTER — Other Ambulatory Visit: Payer: Self-pay | Admitting: Student

## 2024-10-11 DIAGNOSIS — O2342 Unspecified infection of urinary tract in pregnancy, second trimester: Secondary | ICD-10-CM

## 2024-10-11 MED ORDER — NITROFURANTOIN MONOHYD MACRO 100 MG PO CAPS: 100.0000 mg | ORAL_CAPSULE | Freq: Two times a day (BID) | ORAL | 0 refills | Status: AC

## 2024-10-11 NOTE — Progress Notes (Signed)
UTI in pregnancy  RX sent

## 2024-10-12 ENCOUNTER — Encounter: Payer: Self-pay | Admitting: Child & Adolescent Psychiatry

## 2024-10-12 ENCOUNTER — Other Ambulatory Visit: Payer: Self-pay

## 2024-10-12 ENCOUNTER — Inpatient Hospital Stay
Admission: RE | Admit: 2024-10-12 | Discharge: 2024-10-18 | DRG: 832 | Disposition: A | Discharge status: Home / Self Care | Payer: MEDICAID | Attending: Psychiatry | Admitting: Psychiatry

## 2024-10-12 ENCOUNTER — Encounter: Payer: Self-pay | Admitting: Emergency Medicine

## 2024-10-12 ENCOUNTER — Emergency Department
Admission: EM | Admit: 2024-10-12 | Discharge: 2024-10-12 | Disposition: A | Discharge status: Other Acute Inpatient Hospital | Payer: MEDICAID

## 2024-10-12 DIAGNOSIS — J45909 Unspecified asthma, uncomplicated: Secondary | ICD-10-CM | POA: Diagnosis present

## 2024-10-12 DIAGNOSIS — O99342 Other mental disorders complicating pregnancy, second trimester: Principal | ICD-10-CM | POA: Diagnosis present

## 2024-10-12 DIAGNOSIS — Z7984 Long term (current) use of oral hypoglycemic drugs: Secondary | ICD-10-CM

## 2024-10-12 DIAGNOSIS — F319 Bipolar disorder, unspecified: Secondary | ICD-10-CM | POA: Diagnosis present

## 2024-10-12 DIAGNOSIS — O26892 Other specified pregnancy related conditions, second trimester: Secondary | ICD-10-CM | POA: Diagnosis not present

## 2024-10-12 DIAGNOSIS — E119 Type 2 diabetes mellitus without complications: Secondary | ICD-10-CM | POA: Insufficient documentation

## 2024-10-12 DIAGNOSIS — R45851 Suicidal ideations: Secondary | ICD-10-CM | POA: Diagnosis present

## 2024-10-12 DIAGNOSIS — G43909 Migraine, unspecified, not intractable, without status migrainosus: Secondary | ICD-10-CM | POA: Diagnosis present

## 2024-10-12 DIAGNOSIS — Z91041 Radiographic dye allergy status: Secondary | ICD-10-CM

## 2024-10-12 DIAGNOSIS — Z794 Long term (current) use of insulin: Secondary | ICD-10-CM | POA: Insufficient documentation

## 2024-10-12 DIAGNOSIS — Z79899 Other long term (current) drug therapy: Secondary | ICD-10-CM | POA: Insufficient documentation

## 2024-10-12 DIAGNOSIS — F17210 Nicotine dependence, cigarettes, uncomplicated: Secondary | ICD-10-CM | POA: Diagnosis present

## 2024-10-12 DIAGNOSIS — Z885 Allergy status to narcotic agent status: Secondary | ICD-10-CM | POA: Diagnosis not present

## 2024-10-12 DIAGNOSIS — Z3A21 21 weeks gestation of pregnancy: Secondary | ICD-10-CM | POA: Insufficient documentation

## 2024-10-12 DIAGNOSIS — R569 Unspecified convulsions: Secondary | ICD-10-CM

## 2024-10-12 DIAGNOSIS — O99352 Diseases of the nervous system complicating pregnancy, second trimester: Secondary | ICD-10-CM | POA: Diagnosis present

## 2024-10-12 DIAGNOSIS — Z9152 Personal history of nonsuicidal self-harm: Secondary | ICD-10-CM | POA: Diagnosis not present

## 2024-10-12 DIAGNOSIS — F4329 Adjustment disorder with other symptoms: Principal | ICD-10-CM | POA: Diagnosis present

## 2024-10-12 DIAGNOSIS — F909 Attention-deficit hyperactivity disorder, unspecified type: Secondary | ICD-10-CM | POA: Diagnosis present

## 2024-10-12 DIAGNOSIS — Z888 Allergy status to other drugs, medicaments and biological substances status: Secondary | ICD-10-CM

## 2024-10-12 DIAGNOSIS — O24119 Pre-existing diabetes mellitus, type 2, in pregnancy, unspecified trimester: Secondary | ICD-10-CM

## 2024-10-12 DIAGNOSIS — Z3A22 22 weeks gestation of pregnancy: Secondary | ICD-10-CM

## 2024-10-12 DIAGNOSIS — Z3490 Encounter for supervision of normal pregnancy, unspecified, unspecified trimester: Secondary | ICD-10-CM

## 2024-10-12 DIAGNOSIS — Z9109 Other allergy status, other than to drugs and biological substances: Secondary | ICD-10-CM

## 2024-10-12 DIAGNOSIS — F419 Anxiety disorder, unspecified: Secondary | ICD-10-CM | POA: Diagnosis present

## 2024-10-12 DIAGNOSIS — T1491XA Suicide attempt, initial encounter: Secondary | ICD-10-CM

## 2024-10-12 DIAGNOSIS — Z8249 Family history of ischemic heart disease and other diseases of the circulatory system: Secondary | ICD-10-CM | POA: Diagnosis not present

## 2024-10-12 DIAGNOSIS — K219 Gastro-esophageal reflux disease without esophagitis: Secondary | ICD-10-CM | POA: Diagnosis present

## 2024-10-12 DIAGNOSIS — O99330 Smoking (tobacco) complicating pregnancy, unspecified trimester: Secondary | ICD-10-CM | POA: Diagnosis present

## 2024-10-12 DIAGNOSIS — F1990 Other psychoactive substance use, unspecified, uncomplicated: Secondary | ICD-10-CM | POA: Diagnosis not present

## 2024-10-12 DIAGNOSIS — Z5900 Homelessness unspecified: Secondary | ICD-10-CM | POA: Diagnosis not present

## 2024-10-12 DIAGNOSIS — F1410 Cocaine abuse, uncomplicated: Secondary | ICD-10-CM | POA: Diagnosis present

## 2024-10-12 DIAGNOSIS — Z59868 Other specified financial insecurity: Secondary | ICD-10-CM | POA: Diagnosis not present

## 2024-10-12 DIAGNOSIS — Z886 Allergy status to analgesic agent status: Secondary | ICD-10-CM

## 2024-10-12 DIAGNOSIS — F4321 Adjustment disorder with depressed mood: Secondary | ICD-10-CM | POA: Diagnosis not present

## 2024-10-12 DIAGNOSIS — Z882 Allergy status to sulfonamides status: Secondary | ICD-10-CM

## 2024-10-12 DIAGNOSIS — Z91018 Allergy to other foods: Secondary | ICD-10-CM

## 2024-10-12 DIAGNOSIS — Z91040 Latex allergy status: Secondary | ICD-10-CM

## 2024-10-12 DIAGNOSIS — O0990 Supervision of high risk pregnancy, unspecified, unspecified trimester: Secondary | ICD-10-CM

## 2024-10-12 LAB — BLOOD GAS, VENOUS
Acid-base deficit: 3.4 mmol/L — ABNORMAL HIGH (ref 0.0–2.0)
Acid-base deficit: 3.1 mmol/L — ABNORMAL HIGH (ref 0.0–2.0)
Bicarbonate: 20.1 mmol/L (ref 20.0–28.0)
Bicarbonate: 21.2 mmol/L (ref 20.0–28.0)
O2 Saturation: 99.6 %
O2 Saturation: 88.8 %
Patient temperature: 37
Patient temperature: 37
pCO2, Ven: 31 mmHg — ABNORMAL LOW (ref 44–60)
pCO2, Ven: 35 mmHg — ABNORMAL LOW (ref 44–60)
pH, Ven: 7.42 (ref 7.25–7.43)
pH, Ven: 7.39 (ref 7.25–7.43)
pO2, Ven: 168 mmHg — ABNORMAL HIGH (ref 32–45)
pO2, Ven: 59 mmHg — ABNORMAL HIGH (ref 32–45)

## 2024-10-12 LAB — COMPREHENSIVE METABOLIC PANEL WITH GFR
ALT: 8 U/L (ref 0–44)
AST: <10 U/L — ABNORMAL LOW (ref 15–41)
Albumin: 3.4 g/dL — ABNORMAL LOW (ref 3.5–5.0)
Alkaline Phosphatase: 77 U/L (ref 38–126)
Anion gap: 14 (ref 5–15)
BUN: 6 mg/dL (ref 6–20)
CO2: 19 mmol/L — ABNORMAL LOW (ref 22–32)
Calcium: 8.6 mg/dL — ABNORMAL LOW (ref 8.9–10.3)
Chloride: 104 mmol/L (ref 98–111)
Creatinine, Ser: 0.42 mg/dL — ABNORMAL LOW (ref 0.44–1.00)
GFR, Estimated: >60 mL/min
Glucose, Bld: 143 mg/dL — ABNORMAL HIGH (ref 70–99)
Potassium: 3.4 mmol/L — ABNORMAL LOW (ref 3.5–5.1)
Sodium: 136 mmol/L (ref 135–145)
Total Bilirubin: <0.2 mg/dL (ref 0.0–1.2)
Total Protein: 6.5 g/dL (ref 6.5–8.1)

## 2024-10-12 LAB — CBC
HCT: 30.5 % — ABNORMAL LOW (ref 36.0–46.0)
Hemoglobin: 9 g/dL — ABNORMAL LOW (ref 12.0–15.0)
MCH: 20.7 pg — ABNORMAL LOW (ref 26.0–34.0)
MCHC: 29.5 g/dL — ABNORMAL LOW (ref 30.0–36.0)
MCV: 70.1 fL — ABNORMAL LOW (ref 80.0–100.0)
Platelets: 325 K/uL (ref 150–400)
RBC: 4.35 MIL/uL (ref 3.87–5.11)
RDW: 19.8 % — ABNORMAL HIGH (ref 11.5–15.5)
WBC: 10.8 K/uL — ABNORMAL HIGH (ref 4.0–10.5)
nRBC: 0 % (ref 0.0–0.2)

## 2024-10-12 LAB — URINE DRUG SCREEN
Amphetamines: NEGATIVE
Barbiturates: NEGATIVE
Benzodiazepines: NEGATIVE
Cocaine: NEGATIVE
Fentanyl: NEGATIVE
Methadone Scn, Ur: NEGATIVE
Opiates: NEGATIVE
Tetrahydrocannabinol: NEGATIVE

## 2024-10-12 LAB — GLUCOSE, CAPILLARY: Glucose-Capillary: 129 mg/dL — ABNORMAL HIGH (ref 70–99)

## 2024-10-12 LAB — HCG, QUANTITATIVE, PREGNANCY: hCG, Beta Chain, Quant, S: 7477 m[IU]/mL — ABNORMAL HIGH

## 2024-10-12 LAB — ETHANOL: Alcohol, Ethyl (B): <15 mg/dL

## 2024-10-12 LAB — SALICYLATE LEVEL: Salicylate Lvl: <7 mg/dL — ABNORMAL LOW (ref 7.0–30.0)

## 2024-10-12 LAB — ACETAMINOPHEN LEVEL: Acetaminophen (Tylenol), Serum: <10 ug/mL — ABNORMAL LOW (ref 10–30)

## 2024-10-12 LAB — IRON: Iron: 20 ug/dL — ABNORMAL LOW (ref 28–170)

## 2024-10-12 MED ORDER — SODIUM CHLORIDE 0.9 % IV BOLUS
1000.0000 mL | Freq: Once | INTRAVENOUS | Status: AC
  Administered 2024-10-12: 1000 mL via INTRAVENOUS

## 2024-10-12 MED ORDER — LEVETIRACETAM 500 MG PO TABS: 500.0000 mg | ORAL_TABLET | Freq: Two times a day (BID) | ORAL | Status: DC

## 2024-10-12 MED ORDER — DIPHENHYDRAMINE HCL 50 MG/ML IJ SOLN: 50.0000 mg | Freq: Three times a day (TID) | INTRAMUSCULAR | Status: DC | PRN

## 2024-10-12 MED ORDER — ACETAMINOPHEN 325 MG PO TABS
650.0000 mg | ORAL_TABLET | Freq: Four times a day (QID) | ORAL | Status: DC | PRN
  Administered 2024-10-12 – 2024-10-14 (×6): 650 mg via ORAL
  Filled 2024-10-12 (×6): qty 2

## 2024-10-12 MED ORDER — HALOPERIDOL LACTATE 5 MG/ML IJ SOLN: 5.0000 mg | Freq: Three times a day (TID) | INTRAMUSCULAR | Status: DC | PRN

## 2024-10-12 MED ORDER — INSULIN ASPART 100 UNIT/ML IJ SOLN: 0.0000 [IU] | Freq: Three times a day (TID) | INTRAMUSCULAR | Status: DC

## 2024-10-12 MED ORDER — INSULIN ASPART 100 UNIT/ML IJ SOLN: 0.0000 [IU] | Freq: Every day | INTRAMUSCULAR | Status: DC

## 2024-10-12 MED ORDER — ALUM & MAG HYDROXIDE-SIMETH 200-200-20 MG/5ML PO SUSP
30.0000 mL | ORAL | Status: DC | PRN
  Filled 2024-10-12: qty 30

## 2024-10-12 MED ORDER — MAGNESIUM HYDROXIDE 400 MG/5ML PO SUSP: 30.0000 mL | Freq: Every day | ORAL | Status: DC | PRN

## 2024-10-12 MED ORDER — LORAZEPAM 2 MG/ML IJ SOLN: 2.0000 mg | Freq: Three times a day (TID) | INTRAMUSCULAR | Status: DC | PRN

## 2024-10-12 NOTE — Tx Team (Signed)
 Initial Treatment Plan 10/12/2024 6:36 PM Faith [name] [id number]    PATIENT STRESSORS: Marital or family conflict   Substance abuse     PATIENT STRENGTHS: Motivation for treatment/growth    PATIENT IDENTIFIED PROBLEMS: Substance Abuse  Family/relationship conflict                   DISCHARGE CRITERIA:  Improved stabilization in mood, thinking, and/or behavior Verbal commitment to aftercare and medication compliance  PRELIMINARY DISCHARGE PLAN: Placement in alternative living arrangements  PATIENT/FAMILY INVOLVEMENT: This treatment plan has been presented to and reviewed with the patient, Faith [name]. The patient has been given the opportunity to ask questions and make suggestions.  Taksh Hjort Byrd, RN 10/12/2024, 6:36 PM

## 2024-10-12 NOTE — ED Notes (Signed)
 Report given to [place], RN in [facility]

## 2024-10-12 NOTE — ED Notes (Signed)
 Pt  placed under  IVC  papers per  Dr  Nicholaus MD

## 2024-10-12 NOTE — Consult Note (Signed)
 Patient is a 34 year old female who currently is homeless reportedly was living with a boyfriend of 4 to 5 years she reports that she also has challenges with cocaine abuse and other drug abuse and has not used recently but she reports that she was feeling overwhelmed and suicidal because of which she came to the emergency room continues to endorse suicidal thoughts  No symptoms of mania hypomania psychosis Blood pressure 104/62, pulse 88, temperature 97.9 F (36.6 C), resp. rate 13, height 5' (1.524 m), weight 104.3 kg, SpO2 100%.  Current Facility-Administered Medications  Medication Dose Route Frequency Provider Last Rate Last Admin   insulin  aspart (novoLOG ) injection 0-5 Units  0-5 Units Subcutaneous QHS Nicholaus Rolland BRAVO, MD       insulin  aspart (novoLOG ) injection 0-9 Units  0-9 Units Subcutaneous TID WC Davis, Hillary E, MD       levETIRAcetam  (KEPPRA ) tablet 500 mg  500 mg Oral BID Nicholaus Rolland BRAVO, MD       Current Outpatient Medications  Medication Sig Dispense Refill   levETIRAcetam  (KEPPRA ) 500 MG tablet Take 1 tablet (500 mg total) by mouth 2 (two) times daily. 60 tablet 0   Accu-Chek Softclix Lancets lancets Use as instructed (Patient not taking: No sig reported) 100 each 12   Blood Glucose Monitoring Suppl (ACCU-CHEK GUIDE) w/Device KIT 1 Device by Does not apply route in the morning, at noon, in the evening, and at bedtime. (Patient not taking: No sig reported) 1 kit 0   DULoxetine (CYMBALTA) 30 MG capsule Take 30 mg by mouth 2 (two) times daily.     esomeprazole (NEXIUM) 40 MG capsule Take 40 mg by mouth daily at 12 noon.     ferrous sulfate  325 (65 FE) MG tablet Take 1 tablet (325 mg total) by mouth daily. 30 tablet 0   Glucose Blood (BLOOD GLUCOSE TEST STRIPS) STRP 1 each by Does not apply route as directed. Dispense based on patient and insurance preference. Use up to four times daily as directed. (FOR ICD-10 E10.9, E11.9). (Patient not taking: No sig reported) 100 strip 0    metFORMIN  (GLUCOPHAGE ) 500 MG tablet Take 1 tablet (500 mg total) by mouth 2 (two) times daily with a meal. 60 tablet 2   metroNIDAZOLE  (FLAGYL ) 500 MG tablet Take 1 tablet (500 mg total) by mouth 2 (two) times daily. 14 tablet 0   nitrofurantoin , macrocrystal-monohydrate, (MACROBID ) 100 MG capsule Take 1 capsule (100 mg total) by mouth 2 (two) times daily. 14 capsule 0   nystatin  (MYCOSTATIN /NYSTOP ) powder Apply 1 Application topically 3 (three) times daily. 15 g 0   ondansetron  (ZOFRAN -ODT) 4 MG disintegrating tablet Take 1 tablet (4 mg total) by mouth every 8 (eight) hours as needed for nausea. 42 tablet 2   Prenatal Vit-Fe Fumarate-FA (MULTIVITAMIN-PRENATAL) 27-0.8 MG TABS tablet Take 1 tablet by mouth daily at 12 noon.     scopolamine  (TRANSDERM-SCOP) 1 MG/3DAYS Place 1 patch (1 mg total) onto the skin every 3 (three) days. 12 patch 3   terconazole  (TERAZOL 3 ) 0.8 % vaginal cream Place 1 applicator vaginally at bedtime. 20 g 0   VENTOLIN  HFA 108 (90 Base) MCG/ACT inhaler Inhale 2 puffs into the lungs. Every 4-6 hours for wheezing, cough or shortness of breath (Patient not taking: Reported on 10/04/2024)      Mental status examination patient is a 34 year old female who appears stated age alert oriented x 3 mood is okay affect is constricted thought process logical and endorses suicidal thoughts with  no specific plan no homicidal thoughts Insight and judgment at baseline  Assessment adjustment disorder with depressed mood substance use disorder  Plan recommend inpatient level of care

## 2024-10-12 NOTE — ED Triage Notes (Signed)
 Pt to ED via [place] [employer] from home. Pt has been on the phone with mobile crisis line all night. Pt states that she is trying to detox from cocaine but she was unable to go to [facility] last night because they did not have a nurse on duty. Pt states that she last used cocaine 2 months ago. Pt states that this morning she rook #7 1,000 mg Keppra  tablets. Pt states that she was trying to hurt herself. Pt is currently [redacted] weeks pregnant. Pt appears sleepy in triage and speech appears slow.

## 2024-10-12 NOTE — Group Note (Signed)
 Date:  10/12/2024 Time:  8:49 PM  Group Topic/Focus:  Dimensions of Wellness:   The focus of this group is to introduce the topic of wellness and discuss the role each dimension of wellness plays in total health. Self Care:   The focus of this group is to help patients understand the importance of self-care in order to improve or restore emotional, physical, spiritual, interpersonal, and financial health.    Participation Level:  Active  Participation Quality:  Appropriate  Affect:  Appropriate  Cognitive:  Appropriate  Insight: Appropriate  Engagement in Group:  Engaged  Modes of Intervention:  Discussion and Education  Additional Comments:    Kellie Murrill L 10/12/2024, 8:49 PM

## 2024-10-12 NOTE — Progress Notes (Signed)
 Admission Note:  55 yr Caucasian female who presents IVC in no acute distress for the treatment of SI and s/p suicide attempt by overdose on Keppra .. Pt reports been [redacted] weeks pregnant and without discomfort and stated she is getting prenatal care. Pt also reports a hx a hx. Depression, anxiety and substance abuse. Pt appears flat and depressed, tearful and   stated depression and anxiety 7/10.  Pt was calm and cooperative with admission process. Pt denied having SI and contracts for safety upon admission. Pt denies AVH, has a medical Hx of seizure d/o  and DM without complication. Pt skin was assessed and found to be clear of any abnormal marks apart tattoos to UE bilaterally and her back. PT searched and no contraband found, POC and unit policies explained and understanding verbalized. Consents obtained. Food and fluids offered, and  accepted. Pt had no additional questions or concerns Pt assigned to room 312, and placed on Q 15 min rounds for safety and support.

## 2024-10-12 NOTE — ED Notes (Addendum)
 Pt dressed out into hospital attire. Pt belongings to include: 1 black jacket 1 blue shirt 1 black cell phone  2 black shoes  2 black socks 1 scrunchie purple 1 green bra 1 cherry pocketbook 1 brown bag of medications 1 black pants

## 2024-10-12 NOTE — BH Assessment (Signed)
 Comprehensive Clinical Assessment (CCA) Screening, Triage and Referral Note  10/12/2024 Faith [name] [id number]  Chief Complaint:  Chief Complaint  Patient presents with   Ingestion   Visit Diagnosis: MDD and Substance Abuse  Faith [name] is a 34 year old female who presents to the ER reporting suicidal ideation, and depression. She was cooperative during the interview with coherent speech.  She was living with her boyfriend for five years and presents today seeking detox services for cocaine use. Her last cocaine use was approximately one month ago. She reports being pregnant and has discontinued her psychiatric medications, including Adderall and what appears to be another medication she cannot recall, for about one month due to her pregnancy. She expresses concern about not having a stable place to.  Patient Reported Information How did you hear about us ? No data recorded What Is the Reason for Your Visit/Call Today? No data recorded How Long Has This Been Causing You Problems? No data recorded What Do You Feel Would Help You the Most Today? No data recorded  Have You Recently Had Any Thoughts About Hurting Yourself? No data recorded Are You Planning to Commit Suicide/Harm Yourself At This time? No data recorded  Have you Recently Had Thoughts About Hurting Someone Sherral? No data recorded Are You Planning to Harm Someone at This Time? No data recorded Explanation: No data recorded  Have You Used Any Alcohol or Drugs in the Past 24 Hours? No data recorded How Long Ago Did You Use Drugs or Alcohol? No data recorded What Did You Use and How Much? No data recorded  Do You Currently Have a Therapist/Psychiatrist? No data recorded Name of Therapist/Psychiatrist: No data recorded  Have You Been Recently Discharged From Any Office Practice or Programs? No data recorded Explanation of Discharge From Practice/Program: No data recorded   CCA Screening Triage Referral Assessment Type  of Contact: No data recorded Telemedicine Service Delivery:   Is this Initial or Reassessment?   Date Telepsych consult ordered in [facility]:    Time Telepsych consult ordered in [facility]:    Location of Assessment: No data recorded Provider Location: No data recorded   Collateral Involvement: No data recorded  Does Patient Have a Court Appointed Legal Guardian? No data recorded Name and Contact of Legal Guardian: No data recorded If Minor and Not Living with Parent(s), Who has Custody? No data recorded Is CPS involved or ever been involved? No data recorded Is APS involved or ever been involved? No data recorded  Patient Determined To Be At Risk for Harm To Self or Others Based on Review of Patient Reported Information or Presenting Complaint? No data recorded Method: No data recorded Availability of Means: No data recorded Intent: No data recorded Notification Required: No data recorded Additional Information for Danger to Others Potential: No data recorded Additional Comments for Danger to Others Potential: No data recorded Are There Guns or Other Weapons in Your Home? No data recorded Types of Guns/Weapons: No data recorded Are These Weapons Safely Secured?                            No data recorded Who Could Verify You Are Able To Have These Secured: No data recorded Do You Have any Outstanding Charges, Pending Court Dates, Parole/Probation? No data recorded Contacted To Inform of Risk of Harm To Self or Others: No data recorded  Does Patient Present under Involuntary Commitment? No data recorded   [place] of Residence:  No data recorded  Patient Currently Receiving the Following Services: No data recorded  Determination of Need: No data recorded  Options For Referral: No data recorded  Disposition Recommendation per psychiatric provider: Inpatient Treatment  Kiki DOROTHA Barge MS, LCAS, [facility], [facility] Therapeutic Triage Specialist 10/12/2024 2:25 PM

## 2024-10-12 NOTE — Plan of Care (Signed)
  Problem: Education: Goal: Knowledge of [facility] Education information/materials will improve Outcome: Progressing Goal: Verbalization of understanding the information provided will improve Outcome: Progressing   Problem: Safety: Goal: Periods of time without injury will increase Outcome: Progressing   

## 2024-10-12 NOTE — ED Notes (Signed)
 IVC PENDING  CONSULT ?

## 2024-10-12 NOTE — Progress Notes (Signed)
 Patient has been accepted to [facility] BMU 10/12/24. Patient assigned to room 312 Accepting physician is Dr. Sree Jadapalle. Call report to [phone]. Representative was [facility] AC Linsey.   ER Staff is aware of it: [facility] ER Secretary Dr. Floy, ER MD Ross, RN Patient's Nurse

## 2024-10-12 NOTE — ED Notes (Addendum)
 Pt given lunch tray and water per her request. MD aware. Pt encouraged to sit up on stretcher while eating. Verbalized understanding.

## 2024-10-12 NOTE — ED Provider Notes (Signed)
 "  [facility] Provider Note    Event Date/Time   First MD Initiated Contact with Patient 10/12/24 1026     (approximate)   History   Ingestion   HPI  Faith [name] is a 34 y.o. female  female with history of diabetes, asthma, anemia, GERD, bipolar disorder, substance-induced mood disorder, anxiety, who is currently [redacted] weeks pregnant who presents after intentional ingestion.  Patient states that she she was on the phone with mobile crisis overnight and when she could not get detox for cocaine she took 7 of her 1000 mg Keppra  tablets at 4 AM as an attempt to harm self.  Denies any vaginal bleeding or abdominal pain.  States that she feels slightly sleepy.  Denies taking anything else but upon review does seem to have access to iron ).  She was in her usual state of health prior to this.  It does look like she was seen in our emergency department on 10/09/2024.      Physical Exam   Triage Vital Signs: ED Triage Vitals  Encounter Vitals Group     BP 10/12/24 1010 116/63     Girls Systolic BP Percentile --      Girls Diastolic BP Percentile --      Boys Systolic BP Percentile --      Boys Diastolic BP Percentile --      Pulse Rate 10/12/24 1010 93     Resp 10/12/24 1010 16     Temp 10/12/24 1010 97.9 F (36.6 C)     Temp src --      SpO2 10/12/24 1010 97 %     Weight 10/12/24 1014 230 lb (104.3 kg)     Height 10/12/24 1014 5' (1.524 m)     Head Circumference --      Peak Flow --      Pain Score 10/12/24 1014 0     Pain Loc --      Pain Education --      Exclude from Growth Chart --     Most recent vital signs: Vitals:   10/12/24 1010  BP: 116/63  Pulse: 93  Resp: 16  Temp: 97.9 F (36.6 C)  SpO2: 97%    Nursing Triage Note reviewed. Vital signs reviewed and patients oxygen  saturation is normoxic  General: Patient is well nourished, well developed, awake and alert, resting comfortably in no acute distress Head: Normocephalic and  atraumatic Eyes: Normal inspection, extraocular muscles intact, no conjunctival pallor Ear, nose, throat: Normal external exam Neck: Normal range of motion Respiratory: Patient is in no respiratory distress, lungs CTAB Cardiovascular: Patient is not tachycardic, RRR without murmur appreciated GI: Abd SNT with no guarding or rebound  Gravid abdomen with fundus at umbilicus Back: Normal inspection of the back with good strength and range of motion throughout all ext Extremities: pulses intact with good cap refills, no LE pitting edema or calf tenderness Neuro: The patient is alert and oriented to person, place, and time, appropriately conversive, with 5/5 bilat UE/LE strength, no gross motor or sensory defects noted. Coordination appears to be adequate. Skin: Warm, dry, and intact Psych: Depressed mood mood and affect, plus SI no HI  ED Results / Procedures / Treatments   Labs (all labs ordered are listed, but only abnormal results are displayed) Labs Reviewed  COMPREHENSIVE METABOLIC PANEL WITH GFR - Abnormal; Notable for the following components:      Result Value   Potassium 3.4 (*)  CO2 19 (*)    Glucose, Bld 143 (*)    Creatinine, Ser 0.42 (*)    Calcium 8.6 (*)    Albumin 3.4 (*)    AST <10 (*)    All other components within normal limits  CBC - Abnormal; Notable for the following components:   WBC 10.8 (*)    Hemoglobin 9.0 (*)    HCT 30.5 (*)    MCV 70.1 (*)    MCH 20.7 (*)    MCHC 29.5 (*)    RDW 19.8 (*)    All other components within normal limits  SALICYLATE LEVEL - Abnormal; Notable for the following components:   Salicylate Lvl <7.0 (*)    All other components within normal limits  ACETAMINOPHEN  LEVEL - Abnormal; Notable for the following components:   Acetaminophen  (Tylenol ), Serum <10 (*)    All other components within normal limits  HCG, QUANTITATIVE, PREGNANCY - Abnormal; Notable for the following components:   hCG, Beta Chain, Quant, S 7,477 (*)    All  other components within normal limits  BLOOD GAS, VENOUS - Abnormal; Notable for the following components:   pCO2, Ven 31 (*)    pO2, Ven 168 (*)    Acid-base deficit 3.4 (*)    All other components within normal limits  IRON  - Abnormal; Notable for the following components:   Iron  20 (*)    All other components within normal limits  BLOOD GAS, VENOUS - Abnormal; Notable for the following components:   pCO2, Ven 35 (*)    pO2, Ven 59 (*)    Acid-base deficit 3.1 (*)    All other components within normal limits  ETHANOL  URINE DRUG SCREEN  HEMOGLOBIN A1C  POC URINE PREG, ED  CBG MONITORING, ED     EKG EKG and rhythm strip are interpreted by myself:   EKG: [Normal sinus rhythm] at heart rate of 86, normal QRS duration, QTc 451, nonspecific ST segments and T waves no ectopy EKG not consistent with Acute STEMI Rhythm strip: NSR in lead II   RADIOLOGY None    PROCEDURES:  Critical Care performed: Yes, see critical care procedure note(s)  .Critical Care  Performed by: Nicholaus Rolland BRAVO, MD Authorized by: Nicholaus Rolland BRAVO, MD   Critical care provider statement:    Critical care time (minutes):  30   Critical care was necessary to treat or prevent imminent or life-threatening deterioration of the following conditions:  Toxidrome   Critical care was time spent personally by me on the following activities:  Development of treatment plan with patient or surrogate, discussions with consultants, evaluation of patient's response to treatment, examination of patient, ordering and review of laboratory studies, ordering and review of radiographic studies, ordering and performing treatments and interventions, pulse oximetry, re-evaluation of patient's condition and review of old charts Comments:     Need to talk to poison control about acute ingestion, need for acute evaluation for possible seizure-like activity    MEDICATIONS ORDERED IN ED: Medications  levETIRAcetam  (KEPPRA )  tablet 500 mg (has no administration in time range)  insulin  aspart (novoLOG ) injection 0-9 Units (has no administration in time range)  insulin  aspart (novoLOG ) injection 0-5 Units (has no administration in time range)  sodium chloride  0.9 % bolus 1,000 mL (0 mLs Intravenous Stopped 10/12/24 1205)  sodium chloride  0.9 % bolus 1,000 mL (1,000 mLs Intravenous New Bag/Given 10/12/24 1203)     IMPRESSION / MDM / ASSESSMENT AND PLAN / ED COURSE  Differential diagnosis includes, but is not limited to, ingestion arrhythmia, seizures, electrolyte derangement, pregnancy, anemia   ED course: Patient presents and she is mentating well and has no focal neurological deficits.  Given that her ingestion happened at 4 AM which is more than 6 hours upon arrival, she is not a candidate for activated charcoal.  EKG demonstrated no QT prolongation or QRS widening.  Her case was discussed with poison control and they reviewed the information and reports that this was not a toxic dose and that she can be medically cleared if her blood work including ingestion labs comes back unremarkable.  During her stay patient did have an episode of possible seizure-like activity however I do not think this is consistent with epileptic activity.  I am obtaining a repeat VBG.  We are waiting for an iron  level to return but anticipate patient will be cleared for psychiatric disposition afterwards.  I have placed her on an IVC   12:51 PM on 10/12/2024: Patient now medically cleared for psychiatric disposition The patient has been placed in psychiatric observation due to the need to provide a safe environment for the patient while obtaining psychiatric consultation and evaluation, as well as ongoing medical and medication management to treat the patient's condition.  The patient has been placed under full IVC at this time.  Note, patient has an allergy to her prenatal vitamins and consequently cannot order  them    Clinical Course as of 10/12/24 1253  Thu Oct 12, 2024  1035 Patient tells me that she took her Keppra  dose and all the pills at 4 AM this morning.  She is ambulatory to the bathroom and back.  Does not appear significantly lethargic at this time.  This was a suicidal attempt [HD]  1045 Talking with poison control [HD]  1052 Per poison control, if labs ok, medically cleared Case ID: [id number] [HD]  1133 Blood gas, venous(!) Not acidotic [HD]  1143 Called emergently into patient's room as nursing technician concerned about seizure-like activity.  Patient not tachycardic on the monitor, with frequent eye fluttering.  Does stop movement with painful stimuli, hand does not fall on face, does awake with corneal stimulation.  At this time I do not think her current presentation is consistent with seizure-like activity [HD]  1145 FHR 160 bpm with good variability and lots of movement  [HD]  1239 Iron (!): 20 Not elevated [HD]  1240 Will medically clear this patient [HD]    Clinical Course User Index [HD] Nicholaus Rolland BRAVO, MD   -- Risk: 5 This patient has a high risk of morbidity due to further diagnostic testing or treatment. Rationale: This patients evaluation and management involve a high risk of morbidity due to the potential severity of presenting symptoms, need for diagnostic testing, and/or initiation of treatment that may require close monitoring. The differential includes conditions with potential for significant deterioration or requiring escalation of care. Treatment decisions in the ED, including medication administration, procedural interventions, or disposition planning, reflect this level of risk. COPA: 5 The patient has the following acute or chronic illness/injury that poses a possible threat to life or bodily function: [X] : The patient has a potentially serious acute condition or an acute exacerbation of a chronic illness requiring urgent evaluation and management in the  Emergency Department. The clinical presentation necessitates immediate consideration of life-threatening or function-threatening diagnoses, even if they are ultimately ruled out.   FINAL CLINICAL IMPRESSION(S) / ED DIAGNOSES   Final diagnoses:  Suicide attempt (HCC)  Pregnancy, unspecified gestational age  Seizure-like activity [facility])     Rx / DC Orders   ED Discharge Orders     None        Note:  This document was prepared using Dragon voice recognition software and may include unintentional dictation errors.   Nicholaus Rolland BRAVO, MD 10/12/24 1253  "

## 2024-10-12 NOTE — Group Note (Signed)
 Recreation Therapy Group Note   Group Topic:Health and Wellness  Group Date: 10/12/2024 Start Time: 1525 End Time: 1605 Facilitators: Celestia Jeoffrey BRAVO, LRT, CTRS Location: Courtyard  Group Description: [employer]. LRT and patients played games of basketball, drew with chalk, and played corn hole while outside in the courtyard while getting fresh air and sunlight. Music was being played in the background. LRT and peers conversed about different games they have played before, what they do in their free time and anything else that is on their minds. LRT encouraged pts to drink water after being outside, sweating and getting their heart rate up.  Goal Area(s) Addressed: Patient will build on frustration tolerance skills. Patients will partake in a competitive play game with peers. Patients will gain knowledge of new leisure interest/hobby.    Affect/Mood: N/A   Participation Level: Did not attend    Clinical Observations/Individualized Feedback: Patient was not on the unit at the time of group.   Plan: Continue to engage patient in RT group sessions 2-3x/week.   Jeoffrey BRAVO Celestia, LRT, CTRS 10/12/2024 4:59 PM

## 2024-10-13 ENCOUNTER — Other Ambulatory Visit: Payer: Self-pay | Admitting: Student

## 2024-10-13 DIAGNOSIS — O24119 Pre-existing diabetes mellitus, type 2, in pregnancy, unspecified trimester: Secondary | ICD-10-CM

## 2024-10-13 DIAGNOSIS — O0990 Supervision of high risk pregnancy, unspecified, unspecified trimester: Secondary | ICD-10-CM

## 2024-10-13 DIAGNOSIS — F4329 Adjustment disorder with other symptoms: Secondary | ICD-10-CM

## 2024-10-13 DIAGNOSIS — O2412 Pre-existing diabetes mellitus, type 2, in childbirth: Secondary | ICD-10-CM

## 2024-10-13 NOTE — Progress Notes (Signed)
" °   10/13/24 0117  Psych Admission Type (Psych Patients Only)  Admission Status Involuntary  Psychosocial Assessment  Patient Complaints Anxiety;Depression;Substance abuse  Eye Contact Fair  Facial Expression Anxious  Affect Sad  Speech Logical/coherent  Interaction Assertive  Motor Activity Slow  Appearance/Hygiene In scrubs  Behavior Characteristics Cooperative;Anxious  Mood Anxious;Sad  Aggressive Behavior  Effect No apparent injury  Thought Process  Coherency WDL  Content Blaming others;Blaming self  Delusions None reported or observed  Perception WDL  Hallucination None reported or observed  Judgment Poor  Confusion None  Danger to Self  Current suicidal ideation? Denies  Agreement Not to Harm Self Yes  Description of Agreement Verbal  Danger to Others  Danger to Others None reported or observed    "

## 2024-10-13 NOTE — Plan of Care (Signed)

## 2024-10-13 NOTE — Group Note (Signed)
 Date:  10/13/2024 Time:  9:00 PM  Group Topic/Focus:  Conflict Resolution:   The focus of this group is to discuss the conflict resolution process and how it may be used upon discharge.    Participation Level:  Active  Participation Quality:  Appropriate  Affect:  Appropriate  Cognitive:  Appropriate  Insight: Appropriate  Engagement in Group:  Engaged  Modes of Intervention:  Discussion and Education  Additional Comments:    Nasira Janusz L 10/13/2024, 9:00 PM

## 2024-10-13 NOTE — [facility] Counselor (Signed)
 CSW contacted [facility] [phone]) to inquire about pt having bed and when they were expecting to admit her. CSW was informed that they do not have a bed for her. CSW inquired regarding the typical referral process. He was informed that pt would need to go through a phone screening. No other concerns expressed. Contact ended without incident.   CSW informed pt of this information. Pt stated that she had already done phone screening. CSW informed her that they did not have record of her doing this. She agreed to do screening.   CSW was contacted by Elenor FALCON of [facility]. Elenor informed CSW that pt had already completed the phone screening and referral had been made. She stated that she needs the pt to understand that next week pt will be brought before the admissions team to see if she will get a bed there. CSW voiced understanding. Elenor was informed if they needed any further information or to get in touch with pt they could reach out to CSW. She agreed. No other concerns expressed. Contact ended without incident.   CSW updated pt about this information and apologized for the mistaken information prior. Pt inquired about discharge. She was informed that she would need to speak about this further with provider. No other concerns expressed. Contact ended without incident.   Nadara SAUNDERS. Chaim, MSW, LCSW, LCAS 10/13/2024 3:15 PM

## 2024-10-13 NOTE — Group Note (Signed)
 Date:  10/13/2024 Time:  9:58 AM  Group Topic/Focus:  Goals Group:   The focus of this group is to help patients establish daily goals to achieve during treatment and discuss how the patient can incorporate goal setting into their daily lives to aide in recovery.    Participation Level:  Minimal  Participation Quality:  Appropriate  Affect:  Appropriate  Cognitive:  Alert  Insight: Appropriate  Engagement in Group:  Engaged  Modes of Intervention:  Discussion  Additional Comments:    Faith [name] June 10/13/2024, 9:58 AM

## 2024-10-13 NOTE — [facility] Suicide Risk Assessment (Signed)
 [facility] INPATIENT:  Family/Significant Other Suicide Prevention Education  Suicide Prevention Education:  Patient Refusal for Family/Significant Other Suicide Prevention Education: The patient Faith [name] has refused to provide written consent for family/significant other to be provided Family/Significant Other Suicide Prevention Education during admission and/or prior to discharge.  Physician notified.  SPE completed with pt, as pt refused to consent to family contact. SPI pamphlet provided to pt and pt was encouraged to share information with support network, ask questions, and talk about any concerns relating to SPE. Pt denies access to guns/firearms and verbalized understanding of information provided. Mobile Crisis information also provided to pt.  Nadara JONELLE Fam 10/13/2024, 2:56 PM

## 2024-10-13 NOTE — Plan of Care (Signed)
   Problem: Education: Goal: Verbalization of understanding the information provided will improve Outcome: Progressing   Problem: Activity: Goal: Interest or engagement in activities will improve Outcome: Progressing Goal: Sleeping patterns will improve Outcome: Progressing

## 2024-10-13 NOTE — Progress Notes (Signed)
" °   10/13/24 0910  Psych Admission Type (Psych Patients Only)  Admission Status Voluntary  Psychosocial Assessment  Patient Complaints Anxiety  Eye Contact Other (Comment) (WDL)  Facial Expression Other (Comment);Sad  Affect Appropriate to circumstance  Speech Logical/coherent  Interaction Assertive  Motor Activity Other (Comment) (WDL)  Appearance/Hygiene Unremarkable  Behavior Characteristics Cooperative;Appropriate to situation  Mood Pleasant  Thought Process  Coherency WDL  Content WDL  Delusions None reported or observed  Perception WDL  Hallucination None reported or observed  Judgment Impaired  Confusion None  Danger to Self  Current suicidal ideation? Denies  Agreement Not to Harm Self Yes  Description of Agreement verbal  Danger to Others  Danger to Others None reported or observed   D- Patient alert and oriented x 4 but not fully understanding situation for inpatient status. Pt states she is to go to [employer] on Tuesday for stable housing (9 mo.) after having her baby. pt denies SI, HI, AVH, but endorsing anxiety 5/10 and pain 8/10.   A- PRN medications administered to patient, per MD orders. Support and encouragement provided.  Routine safety checks conducted every 15 minutes.  Patient informed to notify staff with problems or concerns.  R- No adverse drug reactions noted. Patient contracts for safety at this time. Patient compliant with medications and treatment plan. Patient receptive, calm, and cooperative. Patient interacts well with others on the unit.  Patient remains safe at this time.    Jatorian Renault S.,RN "
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
## 2024-10-13 NOTE — BH IP Treatment Plan (Signed)
 Interdisciplinary Treatment and Diagnostic Plan Update  10/13/2024 Time of Session: 10:25 AM Faith [name] MRN: [id number]  Principal Diagnosis: Adjustment disorder with emotional disturbance  Secondary Diagnoses: Principal Problem:   Adjustment disorder with emotional disturbance   Current Medications:  Current Facility-Administered Medications  Medication Dose Route Frequency Provider Last Rate Last Admin   acetaminophen  (TYLENOL ) tablet 650 mg  650 mg Oral Q6H PRN Madaram, Kondal R, MD   650 mg at 10/13/24 0910   alum & mag hydroxide-simeth (MAALOX/MYLANTA) 200-200-20 MG/5ML suspension 30 mL  30 mL Oral Q4H PRN Madaram, Kondal R, MD       haloperidol  lactate (HALDOL ) injection 5 mg  5 mg Intramuscular TID PRN Madaram, Kondal R, MD       And   diphenhydrAMINE  (BENADRYL ) injection 50 mg  50 mg Intramuscular TID PRN Madaram, Millie SAUNDERS, MD       And   LORazepam  (ATIVAN ) injection 2 mg  2 mg Intramuscular TID PRN Madaram, Kondal R, MD       magnesium  hydroxide (MILK OF MAGNESIA) suspension 30 mL  30 mL Oral Daily PRN Madaram, Kondal R, MD       PTA Medications: Medications Prior to Admission  Medication Sig Dispense Refill Last Dose/Taking   Accu-Chek Softclix Lancets lancets Use as instructed (Patient not taking: No sig reported) 100 each 12    Blood Glucose Monitoring Suppl (ACCU-CHEK GUIDE) w/Device KIT 1 Device by Does not apply route in the morning, at noon, in the evening, and at bedtime. (Patient not taking: No sig reported) 1 kit 0    DULoxetine (CYMBALTA) 30 MG capsule Take 30 mg by mouth 2 (two) times daily.      esomeprazole (NEXIUM) 40 MG capsule Take 40 mg by mouth daily at 12 noon.      ferrous sulfate  325 (65 FE) MG tablet Take 1 tablet (325 mg total) by mouth daily. 30 tablet 0    Glucose Blood (BLOOD GLUCOSE TEST STRIPS) STRP 1 each by Does not apply route as directed. Dispense based on patient and insurance preference. Use up to four times daily as directed. (FOR ICD-10  E10.9, E11.9). (Patient not taking: No sig reported) 100 strip 0    levETIRAcetam  (KEPPRA ) 500 MG tablet Take 1 tablet (500 mg total) by mouth 2 (two) times daily. 60 tablet 0    metFORMIN  (GLUCOPHAGE ) 500 MG tablet Take 1 tablet (500 mg total) by mouth 2 (two) times daily with a meal. 60 tablet 2    metroNIDAZOLE  (FLAGYL ) 500 MG tablet Take 1 tablet (500 mg total) by mouth 2 (two) times daily. 14 tablet 0    nitrofurantoin , macrocrystal-monohydrate, (MACROBID ) 100 MG capsule Take 1 capsule (100 mg total) by mouth 2 (two) times daily. 14 capsule 0    nystatin  (MYCOSTATIN /NYSTOP ) powder Apply 1 Application topically 3 (three) times daily. 15 g 0    ondansetron  (ZOFRAN -ODT) 4 MG disintegrating tablet Take 1 tablet (4 mg total) by mouth every 8 (eight) hours as needed for nausea. 42 tablet 2    Prenatal Vit-Fe Fumarate-FA (MULTIVITAMIN-PRENATAL) 27-0.8 MG TABS tablet Take 1 tablet by mouth daily at 12 noon.      scopolamine  (TRANSDERM-SCOP) 1 MG/3DAYS Place 1 patch (1 mg total) onto the skin every 3 (three) days. 12 patch 3    terconazole  (TERAZOL 3 ) 0.8 % vaginal cream Place 1 applicator vaginally at bedtime. 20 g 0    VENTOLIN  HFA 108 (90 Base) MCG/ACT inhaler Inhale 2 puffs into the lungs.  Every 4-6 hours for wheezing, cough or shortness of breath (Patient not taking: Reported on 10/04/2024)       Patient Stressors: Marital or family conflict   Substance abuse    Patient Strengths: Motivation for treatment/growth   Treatment Modalities: Medication Management, Group therapy, Case management,  1 to 1 session with clinician, Psychoeducation, Recreational therapy.   Physician Treatment Plan for Primary Diagnosis: Adjustment disorder with emotional disturbance Long Term Goal(s):     Short Term Goals:    Medication Management: Evaluate patient's response, side effects, and tolerance of medication regimen.  Therapeutic Interventions: 1 to 1 sessions, Unit Group sessions and Medication  administration.  Evaluation of Outcomes: Not Met  Physician Treatment Plan for Secondary Diagnosis: Principal Problem:   Adjustment disorder with emotional disturbance  Long Term Goal(s):     Short Term Goals:       Medication Management: Evaluate patient's response, side effects, and tolerance of medication regimen.  Therapeutic Interventions: 1 to 1 sessions, Unit Group sessions and Medication administration.  Evaluation of Outcomes: Not Met   RN Treatment Plan for Primary Diagnosis: Adjustment disorder with emotional disturbance Long Term Goal(s): Knowledge of disease and therapeutic regimen to maintain health will improve  Short Term Goals: Ability to verbalize frustration and anger appropriately will improve, Ability to demonstrate self-control, Ability to participate in decision making will improve, Ability to verbalize feelings will improve, Ability to disclose and discuss suicidal ideas, and Ability to identify and develop effective coping behaviors will improve  Medication Management: RN will administer medications as ordered by provider, will assess and evaluate patient's response and provide education to patient for prescribed medication. RN will report any adverse and/or side effects to prescribing provider.  Therapeutic Interventions: 1 on 1 counseling sessions, Psychoeducation, Medication administration, Evaluate responses to treatment, Monitor vital signs and CBGs as ordered, Perform/monitor CIWA, COWS, AIMS and Fall Risk screenings as ordered, Perform wound care treatments as ordered.  Evaluation of Outcomes: Not Met   LCSW Treatment Plan for Primary Diagnosis: Adjustment disorder with emotional disturbance Long Term Goal(s): Safe transition to appropriate next level of care at discharge, Engage patient in therapeutic group addressing interpersonal concerns.  Short Term Goals: Engage patient in aftercare planning with referrals and resources, Increase social support,  Increase ability to appropriately verbalize feelings, Increase emotional regulation, Facilitate acceptance of mental health diagnosis and concerns, Facilitate patient progression through stages of change regarding substance use diagnoses and concerns, Identify triggers associated with mental health/substance abuse issues, and Increase skills for wellness and recovery  Therapeutic Interventions: Assess for all discharge needs, 1 to 1 time with Social worker, Explore available resources and support systems, Assess for adequacy in community support network, Educate family and significant other(s) on suicide prevention, Complete Psychosocial Assessment, Interpersonal group therapy.  Evaluation of Outcomes: Not Met   Progress in Treatment: Attending groups: Yes. Participating in groups: Yes. Taking medication as prescribed: Yes. Toleration medication: Yes. Family/Significant other contact made: No, will contact:  CSW to contact once permission is granted.  Patient understands diagnosis: Yes. Discussing patient identified problems/goals with staff: Yes. Medical problems stabilized or resolved: Yes. Denies suicidal/homicidal ideation: Yes. Issues/concerns per patient self-inventory: No. Other: None  New problem(s) identified: No, Describe:  None  New Short Term/Long Term Goal(s):detox, elimination of symptoms of psychosis, medication management for mood stabilization; elimination of SI thoughts; development of comprehensive mental wellness/sobriety plan.    Patient Goals:  For detox. Staying sober.   Discharge Plan or Barriers: CSW to assist with  the development of appropriate discharge plan.    Reason for Continuation of Hospitalization: Anxiety Delusions  Depression Hallucinations Medication stabilization Suicidal ideation Withdrawal symptoms  Estimated Length of Stay: 1-7 days.   Last 3 [place] Suicide Severity Risk Score: Flowsheet Row Admission (Current) from 10/12/2024 in [facility]  INPATIENT BEHAVIORAL MEDICINE Most recent reading at 10/12/2024  5:00 PM ED from 10/12/2024 in [facility] Emergency Department at [facility] Most recent reading at 10/12/2024 10:16 AM ED from 10/09/2024 in [facility] Emergency Department at [facility] Most recent reading at 10/09/2024  2:23 AM  C-SSRS RISK CATEGORY Low Risk High Risk No Risk    Last PHQ 2/9 Scores:     No data to display          Scribe for Treatment Team: Lowen Mansouri M Kavish [place], LCSW 10/13/2024 10:46 AM

## 2024-10-14 MED ORDER — QUETIAPINE FUMARATE 100 MG PO TABS
100.0000 mg | ORAL_TABLET | Freq: Every day | ORAL | Status: DC
  Administered 2024-10-14 – 2024-10-17 (×4): 100 mg via ORAL
  Filled 2024-10-14 (×4): qty 1

## 2024-10-14 NOTE — Progress Notes (Addendum)
 Patient ID: Faith [name], female   DOB: Jan 17, 1991, 34 y.o.   MRN: [id number] [facility] MD Progress Note  10/14/2024 12:48 PM Faith [name]  MRN:  [id number]   Subjective:  Chart reviewed, case discussed in multidisciplinary meeting, patient seen during rounds.   10/14/2024: Patient was seen this morning in her room for psychiatric reassessment during rounds.Patient is alert and oriented X 4, calm, cooperative, and engaged well in the interview. Patient reports she is here for substance detox, but she wants to be discharged. She endorses having severe depression and anxiety symptoms, and states being on the inpatient unit makes her feel worse. Patient admits to having cravings, denies withdrawals, but appears irritable as she continues with the interview. She reports she is not opposed to receiving medication for depression. Patient states she is eating and sleeping well, about 7 to 8 hours at night. Patient denies current suicidal or homicidal ideations, or perceptual disturbances. Will begin Quetiapine  100 mg at bedtime for depression/mood, continue to monitor patient for efficacy and adverse reactions to the medication.  Patient is a 34 year old female who currently is homeless reportedly was living with a boyfriend of 4 to 5 years she reports that she also has challenges with cocaine abuse and other drug abuse and has not used recently but she reports that she was feeling overwhelmed and suicidal because of which she came to the emergency room continues to endorse suicidal thoughts Patient is admitted to adult psych unit with Q15 min safety monitoring. Multidisciplinary team approach is offered. Medication management; group/milieu therapy is offered.    Patient reports having argument with the boyfriend and has been homeless.  She did acknowledge doing cocaine and smoking cigarettes throughout the pregnancy.  She reports being pregnant for 22 weeks.  Reports this pregnancy being her fourth child.  She is  unable to identify any support in the community.  She is minimizing her symptoms of depression and reports that she is feeling better now.  She denies feeling hopeless or worthless.  She reports having fair appetite and sleep.  She denies anxiety and reports intermittent panic attacks.  She denies auditory/visual hallucinations.  She reports use of cocaine intermittently.  Patient denies SI/HI/plan.  Patient denies nightmares or flashbacks.  Past Psychiatric History: see h&P Family History:  Family History  Problem Relation Age of Onset   Heart disease Father    Huntington's disease Mother    Social History:  Social History   Substance and Sexual Activity  Alcohol Use Not Currently   Alcohol/week: 0.0 standard drinks of alcohol     Social History   Substance and Sexual Activity  Drug Use Not Currently   Types: Cocaine, Marijuana    Social History   Socioeconomic History   Marital status: Single    Spouse name: Not on file   Number of children: 2   Years of education: Not on file   Highest education level: Not on file  Occupational History   Not on file  Tobacco Use   Smoking status: Every Day    Current packs/day: 0.50    Average packs/day: 0.5 packs/day for 7.0 years (3.5 ttl pk-yrs)    Types: Cigarettes   Smokeless tobacco: Never  Vaping Use   Vaping status: Never Used  Substance and Sexual Activity   Alcohol use: Not Currently    Alcohol/week: 0.0 standard drinks of alcohol   Drug use: Not Currently    Types: Cocaine, Marijuana   Sexual activity: Yes  Birth control/protection: None  Other Topics Concern   Not on file  Social History Narrative   Not on file   Social Drivers of Health   Tobacco Use: High Risk (10/12/2024)   Patient History    Smoking Tobacco Use: Every Day    Smokeless Tobacco Use: Never    Passive Exposure: Not on file  Financial Resource Strain: High Risk (08/26/2024)   Received from [facility]   Overall Financial Resource Strain  (CARDIA)    How hard is it for you to pay for the very basics like food, housing, medical care, and heating?: Very hard  Food Insecurity: No Food Insecurity (10/12/2024)   Epic    Worried About Programme Researcher, Broadcasting/film/video in the Last Year: Never true    Ran Out of Food in the Last Year: Never true  Transportation Needs: Patient Declined (10/12/2024)   Epic    Lack of Transportation (Medical): Patient declined    Lack of Transportation (Non-Medical): Patient declined  Physical Activity: Insufficiently Active (08/26/2024)   Received from [facility]   Exercise Vital Sign    On average, how many days per week do you engage in moderate to strenuous exercise (like a brisk walk)?: 2 days    On average, how many minutes do you engage in exercise at this level?: 10 min  Stress: Stress Concern Present (08/26/2024)   Received from [facility] - Occupational Stress Questionnaire    Do you feel stress - tense, restless, nervous, or anxious, or unable to sleep at night because your mind is troubled all the time - these days?: Very much  Social Connections: Moderately Isolated (08/26/2024)   Received from [facility]   Social Connection and Isolation Panel    In a typical week, how many times do you talk on the phone with family, friends, or neighbors?: Twice a week    How often do you get together with friends or relatives?: Twice a week    How often do you attend church or religious services?: Never    Do you belong to any clubs or organizations such as church groups, unions, fraternal or athletic groups, or school groups?: No    How often do you attend meetings of the clubs or organizations you belong to?: 1 to 4 times per year    Are you married, widowed, divorced, separated, never married, or living with a partner?: Never married  Depression (PHQ2-9): Not on file  Alcohol Screen: Low Risk (10/12/2024)   Alcohol Screen    Last Alcohol Screening Score  (AUDIT): 0  Housing: Patient Declined (10/12/2024)   Epic    Unable to Pay for Housing in the Last Year: Patient declined    Number of Times Moved in the Last Year: Not on file    Homeless in the Last Year: Patient declined  Utilities: Not At Risk (10/12/2024)   Epic    Threatened with loss of utilities: No  Health Literacy: Low Risk (08/26/2024)   Received from [facility]    How often do you need to have someone help you when you read instructions, pamphlets, or other written material from your doctor or pharmacy?: Never   Past Medical History:  Past Medical History:  Diagnosis Date   Acute adjustment disorder with depressed mood 06/18/2023   ADHD    Anemia    BLOOD TRANSFUSION AFTER C-SECTION ON 06-2016-2 UNITS  Anxiety    Asthma    WELL CONTROLLED   B12 deficiency 06/24/2022   Bipolar 1 disorder, depressed (HCC)    COVID-19    Depression    Diabetes mellitus affecting pregnancy, unspecified trimester 09/19/2020   Diabetes mellitus without complication (HCC)    Gallstones    Gastroesophageal reflux disease 08/21/2010   GERD (gastroesophageal reflux disease)    Heart murmur    History of blood transfusion 06/2016   RECEIVED 2 UNITS OF BLOOD AFTER C SECTION   History of maternal blood transfusion, currently pregnant 04/19/2017   Malingering 05/03/2024   Menometrorrhagia 05/13/2022   Migraines    Ovarian cyst    Seizure disorder during pregnancy, third trimester (HCC) 05/10/2017   Seizures (HCC)    LAST SEIZURE January 2022   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   Smoking (tobacco) complicating pregnancy, unspecified trimester 05/10/2017   Substance induced mood disorder (HCC) 06/18/2023   Suicidal ideation 06/15/2013   Tubal pregnancy     Past Surgical History:  Procedure Laterality Date   CESAREAN SECTION N/A 06/22/2016   Procedure: CESAREAN SECTION;  Surgeon: Glory High, MD;  Location: [facility] ORS;  Service: Obstetrics;   Laterality: N/A;   CESAREAN SECTION N/A 10/12/2017   Procedure: CESAREAN SECTION;  Surgeon: High Glory, MD;  Location: [facility] ORS;  Service: Obstetrics;  Laterality: N/A;   CESAREAN SECTION N/A 03/27/2021   Procedure: CESAREAN SECTION;  Surgeon: Victor Claudell SAUNDERS, MD;  Location: [facility] ORS;  Service: Obstetrics;  Laterality: N/A;   CHOLECYSTECTOMY N/A 08/13/2016   Procedure: LAPAROSCOPIC CHOLECYSTECTOMY;  Surgeon: Laneta JULIANNA Luna, MD;  Location: [facility] ORS;  Service: General;  Laterality: N/A;   DILATION AND CURETTAGE OF UTERUS     ovarian cyst removed     SALPINGECTOMY Right 2013   ectopic pregnancy. PER PATIENT, STILL HAS BOTH TUBES   TONSILLECTOMY     TONSILLECTOMY AND ADENOIDECTOMY      Current Medications: Current Facility-Administered Medications  Medication Dose Route Frequency Provider Last Rate Last Admin   acetaminophen  (TYLENOL ) tablet 650 mg  650 mg Oral Q6H PRN Madaram, Kondal R, MD   650 mg at 10/14/24 1241   alum & mag hydroxide-simeth (MAALOX/MYLANTA) 200-200-20 MG/5ML suspension 30 mL  30 mL Oral Q4H PRN Madaram, Kondal R, MD       haloperidol  lactate (HALDOL ) injection 5 mg  5 mg Intramuscular TID PRN Madaram, Kondal R, MD       And   diphenhydrAMINE  (BENADRYL ) injection 50 mg  50 mg Intramuscular TID PRN Madaram, Kondal R, MD       And   LORazepam  (ATIVAN ) injection 2 mg  2 mg Intramuscular TID PRN Madaram, Kondal R, MD       magnesium  hydroxide (MILK OF MAGNESIA) suspension 30 mL  30 mL Oral Daily PRN Madaram, Kondal R, MD        Lab Results:  Results for orders placed or performed during the hospital encounter of 10/12/24 (from the past 48 hours)  Glucose, capillary     Status: Abnormal   Collection Time: 10/12/24  6:44 PM  Result Value Ref Range   Glucose-Capillary 129 (H) 70 - 99 mg/dL    Comment: Glucose reference range applies only to samples taken after fasting for at least 8 hours.    Blood Alcohol level:  Lab Results  Component Value Date   [facility] <15  10/12/2024   [facility] <15 05/03/2024    Metabolic Disorder Labs: Lab Results  Component Value Date  HGBA1C 5.4 03/27/2021   MPG 108 03/27/2021   MPG 103 05/10/2017   Lab Results  Component Value Date   PROLACTIN 8.4 05/17/2019   No results found for: CHOL, TRIG, HDL, CHOLHDL, VLDL, LDLCALC  Physical Findings: AIMS:  , ,  ,  ,    CIWA:    COWS:      Psychiatric Specialty Exam:  Presentation  General Appearance:  Appropriate for Environment  Eye Contact: Fair  Speech: Normal Rate  Speech Volume: Decreased    Mood and Affect  Mood: Dysphoric; Depressed  Affect: Depressed; Flat   Thought Process  Thought Processes: Coherent  Orientation:Full (Time, Place and Person)  Thought Content:Illogical  Hallucinations:Hallucinations: None  Ideas of Reference:None  Suicidal Thoughts:Suicidal Thoughts: Yes, Passive  Homicidal Thoughts:Homicidal Thoughts: No   Sensorium  Memory: Immediate Fair  Judgment: Impaired  Insight: Shallow   Executive Functions  Concentration: Fair  Attention Span: Fair  Recall: Fair  Fund of Knowledge: Fair  Language: Fair   Psychomotor Activity  Psychomotor Activity: Psychomotor Activity: Normal  Musculoskeletal: Strength & Muscle Tone: within normal limits Gait & Station: normal Assets  Assets: Manufacturing Systems Engineer; Desire for Improvement; Resilience    Physical Exam: Physical Exam ROS Blood pressure (!) 108/48, pulse 70, temperature 98 F (36.7 C), temperature source Oral, resp. rate 16, height 5' (1.524 m), weight 108.9 kg, SpO2 99%. Body mass index is 46.87 kg/m.  Diagnosis: Principal Problem:   Adjustment disorder with emotional disturbance   PLAN: Safety and Monitoring:  -- Voluntary admission to inpatient psychiatric unit for safety, stabilization and treatment  -- Daily contact with patient to assess and evaluate symptoms and progress in treatment  -- Patient's case to be  discussed in multi-disciplinary team meeting  -- Observation Level : q15 minute checks  -- Vital signs:  q12 hours  -- Precautions: suicide, elopement, and assault -- Encouraged patient to participate in unit milieu and in scheduled group therapies  2. Psychiatric Treatment:  Scheduled Medications:  -Initiate Quetiapine  100 mg QHS for depression/mood.   -- The risks/benefits/side-effects/alternatives to this medication were discussed in detail with the patient and time was given for questions. The patient consents to medication trial.  3. Medical Issues Being Addressed:   -Patient is at [redacted] weeks gestation.  4. Discharge Planning:   -- Social work and case management to assist with discharge planning and identification of hospital follow-up needs prior to discharge  -- Estimated LOS: 3-4 days   -- Discharge Concerns: Need to establish a safety plan; Medication compliance and effectiveness.             -- Discharge Goals: Return home with outpatient referrals follow ups  Camelia JINNY Mountain, NP 10/14/2024, 12:48 PM

## 2024-10-14 NOTE — Progress Notes (Signed)
" °   10/14/24 0845  Psych Admission Type (Psych Patients Only)  Admission Status Voluntary  Psychosocial Assessment  Patient Complaints Anxiety  Eye Contact Fair  Facial Expression Angry  Affect Angry  Speech Logical/coherent  Interaction Assertive  Motor Activity Other (Comment) (WDL)  Appearance/Hygiene Unremarkable  Behavior Characteristics Cooperative;Appropriate to situation;Calm  Mood Angry  Thought Process  Coherency Circumstantial  Content WDL  Delusions None reported or observed  Perception WDL  Hallucination None reported or observed  Judgment Impaired  Confusion None  Danger to Self  Current suicidal ideation? Denies  Agreement Not to Harm Self Yes  Description of Agreement verbal  Danger to Others  Danger to Others None reported or observed   D- Patient alert and oriented x 3. Pt disoriented to situation. Pt angry due to not being discharged yesterday or having a pending discharge for today. Pt states she has been here 3 days and was informed treatment was 3-5 days. Writer clarified treatment meeting from yesterday.  Affect/mood. Denies SI, HI, AVH, and pain. Pt endorsing anxiety 7/10 and depression 8/10.  A- PRN tylenol  provided for tooth pain. Support and encouragement provided.  Routine safety checks conducted every 15 minutes.  Patient informed to notify staff with problems or concerns.  R- No adverse drug reactions noted. Patient contracts for safety at this time. Patient compliant with medications and treatment plan. Patient receptive, calm, and cooperative. Patient interacts well with others on the unit.  Patient remains safe at this time.    Faith [name] S.,RN "

## 2024-10-14 NOTE — Group Note (Signed)
 Date:  10/14/2024 Time:  11:34 PM  Group Topic/Focus:  Personal Choices and Values:   The focus of this group is to help patients assess and explore the importance of values in their lives, how their values affect their decisions, how they express their values and what opposes their expression.    Participation Level:  Active  Participation Quality:  Appropriate  Affect:  Appropriate  Cognitive:  Appropriate  Insight: Appropriate  Engagement in Group:  Distracting and Engaged  Modes of Intervention:  Discussion and Education  Additional Comments:    Faith [name] L 10/14/2024, 11:34 PM

## 2024-10-14 NOTE — H&P (Signed)
 " Psychiatric Admission Assessment Adult  Patient Identification: Faith [name] MRN:  [id number] Date of Evaluation:  10/14/2024 Chief Complaint:  Adjustment disorder with emotional disturbance [F43.29]   History of Present Illness:  Patient is a 34 year old female who currently is homeless reportedly was living with a boyfriend of 4 to 5 years she reports that she also has challenges with cocaine abuse and other drug abuse and has not used recently but she reports that she was feeling overwhelmed and suicidal because of which she came to the emergency room continues to endorse suicidal thoughts Patient is admitted to adult psych unit with Q15 min safety monitoring. Multidisciplinary team approach is offered. Medication management; group/milieu therapy is offered.   Patient reports having argument with the boyfriend and has been homeless.  She did acknowledge doing cocaine and smoking cigarettes throughout the pregnancy.  She reports being pregnant for 22 weeks.  Reports this pregnancy being her fourth child.  She is unable to identify any support in the community.  She is minimizing her symptoms of depression and reports that she is feeling better now.  She denies feeling hopeless or worthless.  She reports having fair appetite and sleep.  She denies anxiety and reports intermittent panic attacks.  She denies auditory/visual hallucinations.  She reports use of cocaine intermittently.  Patient denies SI/HI/plan.  Patient denies nightmares or flashbacks. Did the patient present with any abnormal findings indicating the need for additional neurological or psychological testing?  No  Total Time spent with patient: 1 hour Sleep  Sleep:No data recorded Past Psychiatric History:  Psychiatric History:  Information collected from patient/chart  Prev Dx/Sx: depression Current Psych Provider: none Home Meds (current): none Previous Med Trials: unknown Therapy: none  Prior Psych Hospitalization: few  months ago  Prior Self Harm: yes Prior Violence: denies  Family Psych History: denies Family Hx suicide: denies  Social History:   Educational Hx: HS Occupational Hx: disability Legal Hx: denies Living Situation: with BF but currently homeless Spiritual Hx: denies Access to weapons/lethal means: denies   Substance History Alcohol: denies   Tobacco: cigg half ppd Illicit drugs: cocaine few days ago Prescription drug abuse: denies Rehab hx: denies Is the patient at risk to self? Yes.    Has the patient been a risk to self in the past 6 months? No.  Has the patient been a risk to self within the distant past? No.  Is the patient a risk to others? No.  Has the patient been a risk to others in the past 6 months? No.  Has the patient been a risk to others within the distant past? No.   Columbia Scale:  Flowsheet Row Admission (Current) from 10/12/2024 in [facility] INPATIENT BEHAVIORAL MEDICINE Most recent reading at 10/12/2024  5:00 PM ED from 10/12/2024 in [facility] Emergency Department at [facility] Most recent reading at 10/12/2024 10:16 AM ED from 10/09/2024 in [facility] Emergency Department at [facility] Most recent reading at 10/09/2024  2:23 AM  C-SSRS RISK CATEGORY Low Risk High Risk No Risk     Past Medical History:  Past Medical History:  Diagnosis Date   Acute adjustment disorder with depressed mood 06/18/2023   ADHD    Anemia    BLOOD TRANSFUSION AFTER C-SECTION ON 06-2016-2 UNITS   Anxiety    Asthma    WELL CONTROLLED   B12 deficiency 06/24/2022   Bipolar 1 disorder, depressed (HCC)    COVID-19    Depression    Diabetes mellitus  affecting pregnancy, unspecified trimester 09/19/2020   Diabetes mellitus without complication (HCC)    Gallstones    Gastroesophageal reflux disease 08/21/2010   GERD (gastroesophageal reflux disease)    Heart murmur    History of blood transfusion 06/2016   RECEIVED 2 UNITS OF BLOOD AFTER C SECTION   History of maternal  blood transfusion, currently pregnant 04/19/2017   Malingering 05/03/2024   Menometrorrhagia 05/13/2022   Migraines    Ovarian cyst    Seizure disorder during pregnancy, third trimester (HCC) 05/10/2017   Seizures (HCC)    LAST SEIZURE January 2022   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   Smoking (tobacco) complicating pregnancy, unspecified trimester 05/10/2017   Substance induced mood disorder (HCC) 06/18/2023   Suicidal ideation 06/15/2013   Tubal pregnancy     Past Surgical History:  Procedure Laterality Date   CESAREAN SECTION N/A 06/22/2016   Procedure: CESAREAN SECTION;  Surgeon: Glory High, MD;  Location: [facility] ORS;  Service: Obstetrics;  Laterality: N/A;   CESAREAN SECTION N/A 10/12/2017   Procedure: CESAREAN SECTION;  Surgeon: High Glory, MD;  Location: [facility] ORS;  Service: Obstetrics;  Laterality: N/A;   CESAREAN SECTION N/A 03/27/2021   Procedure: CESAREAN SECTION;  Surgeon: Victor Claudell SAUNDERS, MD;  Location: [facility] ORS;  Service: Obstetrics;  Laterality: N/A;   CHOLECYSTECTOMY N/A 08/13/2016   Procedure: LAPAROSCOPIC CHOLECYSTECTOMY;  Surgeon: Laneta JULIANNA Luna, MD;  Location: [facility] ORS;  Service: General;  Laterality: N/A;   DILATION AND CURETTAGE OF UTERUS     ovarian cyst removed     SALPINGECTOMY Right 2013   ectopic pregnancy. PER PATIENT, STILL HAS BOTH TUBES   TONSILLECTOMY     TONSILLECTOMY AND ADENOIDECTOMY     Family History:  Family History  Problem Relation Age of Onset   Heart disease Father    Huntington's disease Mother     Social History:  Social History   Substance and Sexual Activity  Alcohol Use Not Currently   Alcohol/week: 0.0 standard drinks of alcohol     Social History   Substance and Sexual Activity  Drug Use Not Currently   Types: Cocaine, Marijuana      Allergies:  Allergies[1] Lab Results:  Results for orders placed or performed during the hospital encounter of 10/12/24 (from the past 48 hours)  Glucose,  capillary     Status: Abnormal   Collection Time: 10/12/24  6:44 PM  Result Value Ref Range   Glucose-Capillary 129 (H) 70 - 99 mg/dL    Comment: Glucose reference range applies only to samples taken after fasting for at least 8 hours.    Blood Alcohol level:  Lab Results  Component Value Date   [facility] <15 10/12/2024   ETH <15 05/03/2024    Metabolic Disorder Labs:  Lab Results  Component Value Date   HGBA1C 5.4 03/27/2021   MPG 108 03/27/2021   MPG 103 05/10/2017   Lab Results  Component Value Date   PROLACTIN 8.4 05/17/2019   No results found for: CHOL, TRIG, HDL, CHOLHDL, VLDL, LDLCALC  Current Medications: Current Facility-Administered Medications  Medication Dose Route Frequency Provider Last Rate Last Admin   acetaminophen  (TYLENOL ) tablet 650 mg  650 mg Oral Q6H PRN Madaram, Kondal R, MD   650 mg at 10/13/24 2136   alum & mag hydroxide-simeth (MAALOX/MYLANTA) 200-200-20 MG/5ML suspension 30 mL  30 mL Oral Q4H PRN Madaram, Kondal R, MD       haloperidol  lactate (HALDOL ) injection 5 mg  5 mg  Intramuscular TID PRN Madaram, Kondal R, MD       And   diphenhydrAMINE  (BENADRYL ) injection 50 mg  50 mg Intramuscular TID PRN Madaram, Kondal R, MD       And   LORazepam  (ATIVAN ) injection 2 mg  2 mg Intramuscular TID PRN Madaram, Kondal R, MD       magnesium  hydroxide (MILK OF MAGNESIA) suspension 30 mL  30 mL Oral Daily PRN Madaram, Kondal R, MD       PTA Medications: Medications Prior to Admission  Medication Sig Dispense Refill Last Dose/Taking   Accu-Chek Softclix Lancets lancets Use as instructed (Patient not taking: No sig reported) 100 each 12    Blood Glucose Monitoring Suppl (ACCU-CHEK GUIDE) w/Device KIT 1 Device by Does not apply route in the morning, at noon, in the evening, and at bedtime. (Patient not taking: No sig reported) 1 kit 0    DULoxetine (CYMBALTA) 30 MG capsule Take 30 mg by mouth 2 (two) times daily.      esomeprazole (NEXIUM) 40 MG capsule  Take 40 mg by mouth daily at 12 noon.      ferrous sulfate  325 (65 FE) MG tablet Take 1 tablet (325 mg total) by mouth daily. 30 tablet 0    Glucose Blood (BLOOD GLUCOSE TEST STRIPS) STRP 1 each by Does not apply route as directed. Dispense based on patient and insurance preference. Use up to four times daily as directed. (FOR ICD-10 E10.9, E11.9). (Patient not taking: No sig reported) 100 strip 0    levETIRAcetam  (KEPPRA ) 500 MG tablet Take 1 tablet (500 mg total) by mouth 2 (two) times daily. 60 tablet 0    metFORMIN  (GLUCOPHAGE ) 500 MG tablet Take 1 tablet (500 mg total) by mouth 2 (two) times daily with a meal. 60 tablet 2    metroNIDAZOLE  (FLAGYL ) 500 MG tablet Take 1 tablet (500 mg total) by mouth 2 (two) times daily. 14 tablet 0    nitrofurantoin , macrocrystal-monohydrate, (MACROBID ) 100 MG capsule Take 1 capsule (100 mg total) by mouth 2 (two) times daily. 14 capsule 0    nystatin  (MYCOSTATIN /NYSTOP ) powder Apply 1 Application topically 3 (three) times daily. 15 g 0    ondansetron  (ZOFRAN -ODT) 4 MG disintegrating tablet Take 1 tablet (4 mg total) by mouth every 8 (eight) hours as needed for nausea. 42 tablet 2    Prenatal Vit-Fe Fumarate-FA (MULTIVITAMIN-PRENATAL) 27-0.8 MG TABS tablet Take 1 tablet by mouth daily at 12 noon.      scopolamine  (TRANSDERM-SCOP) 1 MG/3DAYS Place 1 patch (1 mg total) onto the skin every 3 (three) days. 12 patch 3    terconazole  (TERAZOL 3 ) 0.8 % vaginal cream Place 1 applicator vaginally at bedtime. 20 g 0    VENTOLIN  HFA 108 (90 Base) MCG/ACT inhaler Inhale 2 puffs into the lungs. Every 4-6 hours for wheezing, cough or shortness of breath (Patient not taking: Reported on 10/04/2024)       Psychiatric Specialty Exam:  Presentation  General Appearance:  Appropriate for Environment  Eye Contact: Fair  Speech: Normal Rate  Speech Volume: Decreased    Mood and Affect  Mood: Dysphoric; Depressed  Affect: Depressed; Flat   Thought Process   Thought Processes: Coherent  Descriptions of Associations:Intact  Orientation:Full (Time, Place and Person)  Thought Content:Illogical  Hallucinations:Hallucinations: None  Ideas of Reference:None  Suicidal Thoughts:Suicidal Thoughts: Yes, Passive  Homicidal Thoughts:Homicidal Thoughts: No   Sensorium  Memory: Immediate Fair  Judgment: Impaired  Insight: Community Education Officer  Concentration: Fair  Attention Span: Fair  Recall: [employer] of Knowledge: Fair  Language: Fair   Psychomotor Activity  Psychomotor Activity: Psychomotor Activity: Normal   Assets  Assets: Manufacturing Systems Engineer; Desire for Improvement; Resilience    Musculoskeletal: Strength & Muscle Tone: within normal limits Gait & Station: normal  Physical Exam: Physical Exam Vitals and nursing note reviewed.  HENT:     Head: Normocephalic.     Nose: Nose normal.  Cardiovascular:     Rate and Rhythm: Normal rate.     Pulses: Normal pulses.  Pulmonary:     Effort: Pulmonary effort is normal.  Neurological:     Mental Status: She is alert.    Review of Systems  Constitutional: Negative.   HENT: Negative.    Eyes: Negative.   Cardiovascular: Negative.   Skin: Negative.    Blood pressure (!) 108/48, pulse 70, temperature 98 F (36.7 C), temperature source Oral, resp. rate 16, height 5' (1.524 m), weight 108.9 kg, SpO2 99%. Body mass index is 46.87 kg/m.  Principal Diagnosis: Adjustment disorder with emotional disturbance Diagnosis:  Principal Problem:   Adjustment disorder with emotional disturbance   Clinical Decision Making:  Treatment Plan Summary:  Safety and Monitoring:             -- Voluntary admission to inpatient psychiatric unit for safety, stabilization and treatment             -- Daily contact with patient to assess and evaluate symptoms and progress in treatment             -- Patient's case to be discussed in multi-disciplinary team  meeting             -- Observation Level: q15 minute checks             -- Vital signs:  q12 hours             -- Precautions: suicide, elopement, and assault   2. Psychiatric Diagnoses and Treatment:                patient is pregnant and is hesitent about psyhcotropics   -- The risks/benefits/side-effects/alternatives to this medication were discussed in detail with the patient and time was given for questions. The patient consents to medication trial.                -- Metabolic profile and EKG monitoring obtained while on an atypical antipsychotic (BMI: Lipid Panel: HbgA1c: QTc:)              -- Encouraged patient to participate in unit milieu and in scheduled group therapies                            3. Medical Issues Being Addressed:      4. Discharge Planning:              -- Social work and case management to assist with discharge planning and identification of hospital follow-up needs prior to discharge             -- Estimated LOS: 5-7 days             -- Discharge Concerns: Need to establish a safety plan; Medication compliance and effectiveness             -- Discharge Goals: Return home with outpatient referrals follow ups  Physician Treatment Plan for Primary Diagnosis: Adjustment disorder with  emotional disturbance Long Term Goal(s): Improvement in symptoms so as ready for discharge  Short Term Goals: Ability to identify changes in lifestyle to reduce recurrence of condition will improve, Ability to verbalize feelings will improve, Ability to disclose and discuss suicidal ideas, and Ability to demonstrate self-control will improve  Physician Treatment Plan for Secondary Diagnosis: Principal Problem:   Adjustment disorder with emotional disturbance  Long Term Goal(s): Improvement in symptoms so as ready for discharge  Short Term Goals: Ability to identify changes in lifestyle to reduce recurrence of condition will improve, Ability to verbalize feelings will improve,  Ability to disclose and discuss suicidal ideas, Ability to demonstrate self-control will improve, and Ability to identify and develop effective coping behaviors will improve  I certify that inpatient services furnished can reasonably be expected to improve the patient's condition.    Sharis Keeran, MD 1/10/202612:11 AM    [1]  Allergies Allergen Reactions   Amipaque [Metrizamide] Anaphylaxis   Azulfidine [Sulfasalazine] Hives   Ivp Dye [Iodinated Contrast Media] Anaphylaxis    Per patient, she does not have problems with betadine   Latex Rash   Ms Contin [Morphine] Other (See Comments)    Bradycardia    Mushroom Extract Complex (Obsolete) Anaphylaxis, Hives, Shortness Of Breath, Swelling and Other (See Comments)    Throat swelling   Neurontin  [Gabapentin ] Hives   Tomato Anaphylaxis   Toradol [Ketorolac Tromethamine] Anaphylaxis, Hives and Swelling   Asa [Aspirin] Hives   Dilaudid  [Hydromorphone  Hcl] Hives   Lamictal [Lamotrigine] Hives   Sulfa Antibiotics Hives   Ultram [Tramadol] Hives, Swelling and Rash   Venofer  [Iron  Sucrose] Other (See Comments)    Chest pain Warm sensation Hypotension    Adhesive [Tape] Itching    Paper tape OK   Iohexol  Rash   "

## 2024-10-14 NOTE — BHH Suicide Risk Assessment (Signed)
 [facility] Admission Suicide Risk Assessment   Nursing information obtained from:  Patient Demographic factors:  Caucasian, Low socioeconomic status, Unemployed Current Mental Status:  Suicidal ideation indicated by patient, Self-harm behaviors Loss Factors:  Financial problems / change in socioeconomic status, Loss of significant relationship Historical Factors:  Impulsivity Risk Reduction Factors:  Pregnancy  Total Time spent with patient: 30 minutes Principal Problem: Adjustment disorder with emotional disturbance Diagnosis:  Principal Problem:   Adjustment disorder with emotional disturbance  Subjective Data: Patient is a 34 year old female who currently is homeless reportedly was living with a boyfriend of 4 to 5 years she reports that she also has challenges with cocaine abuse and other drug abuse and has not used recently but she reports that she was feeling overwhelmed and suicidal because of which she came to the emergency room continues to endorse suicidal thoughts Patient is admitted to adult psych unit with Q15 min safety monitoring. Multidisciplinary team approach is offered. Medication management; group/milieu therapy is offered.   Continued Clinical Symptoms:  Alcohol Use Disorder Identification Test Final Score (AUDIT): 0 The Alcohol Use Disorders Identification Test, Guidelines for Use in Primary Care, Second Edition.  World Science Writer [facility]). Score between 0-7:  no or low risk or alcohol related problems. Score between 8-15:  moderate risk of alcohol related problems. Score between 16-19:  high risk of alcohol related problems. Score 20 or above:  warrants further diagnostic evaluation for alcohol dependence and treatment.   CLINICAL FACTORS:   Depression:   Impulsivity   Musculoskeletal: Strength & Muscle Tone: within normal limits Gait & Station: normal Patient leans: N/A  Psychiatric Specialty Exam:  Presentation  General Appearance:  Appropriate for  Environment  Eye Contact: Fair  Speech: Normal Rate  Speech Volume: Decreased  Handedness: Right   Mood and Affect  Mood: Dysphoric; Depressed  Affect: Depressed; Flat   Thought Process  Thought Processes: Coherent  Descriptions of Associations:Intact  Orientation:Full (Time, Place and Person)  Thought Content:Illogical  History of Schizophrenia/Schizoaffective disorder:No data recorded Duration of Psychotic Symptoms:No data recorded Hallucinations:Hallucinations: None  Ideas of Reference:None  Suicidal Thoughts:Suicidal Thoughts: Yes, Passive  Homicidal Thoughts:Homicidal Thoughts: No   Sensorium  Memory: Immediate Fair  Judgment: Impaired  Insight: Shallow   Executive Functions  Concentration: Fair  Attention Span: Fair  Recall: Fair  Fund of Knowledge: Fair  Language: Fair   Psychomotor Activity  Psychomotor Activity: Psychomotor Activity: Normal   Assets  Assets: Communication Skills; Desire for Improvement; Resilience   Sleep  Sleep:No data recorded   Physical Exam: Physical Exam ROS Blood pressure (!) 108/48, pulse 70, temperature 98 F (36.7 C), temperature source Oral, resp. rate 16, height 5' (1.524 m), weight 108.9 kg, SpO2 99%. Body mass index is 46.87 kg/m.   COGNITIVE FEATURES THAT CONTRIBUTE TO RISK:  None    SUICIDE RISK:   Minimal: No identifiable suicidal ideation.  Patients presenting with no risk factors but with morbid ruminations; may be classified as minimal risk based on the severity of the depressive symptoms  PLAN OF CARE: Patient is admitted to [facility] psych unit with Q15 min safety monitoring. Multidisciplinary team approach is offered. Medication management; group/milieu therapy is offered.   I certify that inpatient services furnished can reasonably be expected to improve the patient's condition.   Allyn Foil, MD 10/14/2024, 12:08 AM

## 2024-10-14 NOTE — Progress Notes (Signed)
" °   10/14/24 0241  Psych Admission Type (Psych Patients Only)  Admission Status Voluntary  Psychosocial Assessment  Patient Complaints Anxiety  Eye Contact Other (Comment) (normal)  Facial Expression Sad  Affect Appropriate to circumstance  Speech Logical/coherent  Interaction Assertive  Motor Activity Other (Comment) (ok)  Appearance/Hygiene Unremarkable  Behavior Characteristics Cooperative;Appropriate to situation  Mood Pleasant  Thought Process  Coherency WDL  Content WDL  Delusions None reported or observed  Perception WDL  Hallucination None reported or observed  Judgment Impaired  Confusion None  Danger to Self  Current suicidal ideation? Denies  Agreement Not to Harm Self Yes  Description of Agreement Verbal  Danger to Others  Danger to Others None reported or observed    "

## 2024-10-14 NOTE — Group Note (Signed)
 Date:  10/14/2024 Time:  6:53 PM  Group Topic/Focus:  Wellness Toolbox:   The focus of this group is to discuss various aspects of wellness, balancing those aspects and exploring ways to increase the ability to experience wellness.  Patients will create a wellness toolbox for use upon discharge.    Participation Level:  Active  Participation Quality:  Appropriate  Affect:  Appropriate  Cognitive:  Appropriate  Insight: Appropriate  Engagement in Group:  Engaged  Modes of Intervention:  Activity  Additional Comments:    Faith [name] Faith [name] 10/14/2024, 6:53 PM

## 2024-10-14 NOTE — Group Note (Signed)
 Date:  10/14/2024 Time:  6:49 PM  Group Topic/Focus:  Conflict Resolution:   The focus of this group is to discuss the conflict resolution process and how it may be used upon discharge.    Participation Level:  Did Not Attend   [name] 10/14/2024, 6:49 PM

## 2024-10-14 NOTE — Plan of Care (Signed)
 Faith [name] is a 34 y.o. female patient. No diagnosis found. Past Medical History:  Diagnosis Date   Acute adjustment disorder with depressed mood 06/18/2023   ADHD    Anemia    BLOOD TRANSFUSION AFTER C-SECTION ON 06-2016-2 UNITS   Anxiety    Asthma    WELL CONTROLLED   B12 deficiency 06/24/2022   Bipolar 1 disorder, depressed (HCC)    COVID-19    Depression    Diabetes mellitus affecting pregnancy, unspecified trimester 09/19/2020   Diabetes mellitus without complication (HCC)    Gallstones    Gastroesophageal reflux disease 08/21/2010   GERD (gastroesophageal reflux disease)    Heart murmur    History of blood transfusion 06/2016   RECEIVED 2 UNITS OF BLOOD AFTER C SECTION   History of maternal blood transfusion, currently pregnant 04/19/2017   Malingering 05/03/2024   Menometrorrhagia 05/13/2022   Migraines    Ovarian cyst    Seizure disorder during pregnancy, third trimester (HCC) 05/10/2017   Seizures (HCC)    LAST SEIZURE January 2022   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   Smoking (tobacco) complicating pregnancy, unspecified trimester 05/10/2017   Substance induced mood disorder (HCC) 06/18/2023   Suicidal ideation 06/15/2013   Tubal pregnancy    Current Facility-Administered Medications  Medication Dose Route Frequency Provider Last Rate Last Admin   acetaminophen  (TYLENOL ) tablet 650 mg  650 mg Oral Q6H PRN Madaram, Kondal R, MD   650 mg at 10/13/24 2136   alum & mag hydroxide-simeth (MAALOX/MYLANTA) 200-200-20 MG/5ML suspension 30 mL  30 mL Oral Q4H PRN Madaram, Kondal R, MD       haloperidol  lactate (HALDOL ) injection 5 mg  5 mg Intramuscular TID PRN Madaram, Kondal R, MD       And   diphenhydrAMINE  (BENADRYL ) injection 50 mg  50 mg Intramuscular TID PRN Madaram, Kondal R, MD       And   LORazepam  (ATIVAN ) injection 2 mg  2 mg Intramuscular TID PRN Madaram, Kondal R, MD       magnesium  hydroxide (MILK OF MAGNESIA) suspension 30 mL  30 mL Oral  Daily PRN Madaram, Kondal R, MD       Allergies[1] Principal Problem:   Adjustment disorder with emotional disturbance  Blood pressure (!) 108/48, pulse 70, temperature 98 F (36.7 C), temperature source Oral, resp. rate 16, height 5' (1.524 m), weight 108.9 kg, SpO2 99%.   Faith [name] 10/14/2024      [1]  Allergies Allergen Reactions   Amipaque [Metrizamide] Anaphylaxis   Azulfidine [Sulfasalazine] Hives   Ivp Dye [Iodinated Contrast Media] Anaphylaxis    Per patient, she does not have problems with betadine   Latex Rash   Ms Contin [Morphine] Other (See Comments)    Bradycardia    Mushroom Extract Complex (Obsolete) Anaphylaxis, Hives, Shortness Of Breath, Swelling and Other (See Comments)    Throat swelling   Neurontin  [Gabapentin ] Hives   Tomato Anaphylaxis   Toradol [Ketorolac Tromethamine] Anaphylaxis, Hives and Swelling   Asa [Aspirin] Hives   Dilaudid  [Hydromorphone  Hcl] Hives   Lamictal [Lamotrigine] Hives   Sulfa Antibiotics Hives   Ultram [Tramadol] Hives, Swelling and Rash   Venofer  [Iron  Sucrose] Other (See Comments)    Chest pain Warm sensation Hypotension    Adhesive [Tape] Itching    Paper tape OK   Iohexol  Rash

## 2024-10-15 LAB — LIPID PANEL
Cholesterol: 174 mg/dL (ref 0–200)
HDL: 50 mg/dL
LDL Cholesterol: 83 mg/dL (ref 0–99)
Total CHOL/HDL Ratio: 3.5 ratio
Triglycerides: 204 mg/dL — ABNORMAL HIGH
VLDL: 41 mg/dL — ABNORMAL HIGH (ref 0–40)

## 2024-10-15 LAB — HEMOGLOBIN A1C
Hgb A1c MFr Bld: 5.3 % (ref 4.8–5.6)
Mean Plasma Glucose: 105.41 mg/dL

## 2024-10-15 MED ORDER — FERROUS SULFATE 325 (65 FE) MG PO TABS
325.0000 mg | ORAL_TABLET | ORAL | Status: DC
  Administered 2024-10-15 – 2024-10-17 (×2): 325 mg via ORAL
  Filled 2024-10-15 (×2): qty 1

## 2024-10-15 MED ORDER — COMPLETENATE 29-1 MG PO CHEW
1.0000 | CHEWABLE_TABLET | Freq: Every day | ORAL | Status: DC
  Administered 2024-10-16 – 2024-10-17 (×2): 1 via ORAL
  Filled 2024-10-15 (×3): qty 1

## 2024-10-15 MED ORDER — METFORMIN HCL ER 500 MG PO TB24
500.0000 mg | ORAL_TABLET | Freq: Every day | ORAL | Status: DC
  Administered 2024-10-16 – 2024-10-18 (×3): 500 mg via ORAL
  Filled 2024-10-15 (×3): qty 1

## 2024-10-15 MED ORDER — FERROUS GLUCONATE 324 (38 FE) MG PO TABS: 324.0000 mg | ORAL_TABLET | ORAL | Status: DC

## 2024-10-15 NOTE — Progress Notes (Signed)
 Patient ID: Faith [name], female   DOB: January 26, 1991, 34 y.o.   MRN: [id number] [facility] MD Progress Note  10/15/2024 1:26 PM Faith [name]  MRN:  [id number]   Subjective:  Chart reviewed, case discussed in multidisciplinary meeting, patient seen during rounds.   10/15/2024: Patient was seen this morning in her room for psychiatric reassessment during rounds.Patient is alert and oriented X 4, calm, cooperative, and engaged well in the interview. Patient reports she feels that the Quetiapine  helped her sleep well and she can tell a notable improvement in her anxiety today. However, she remains discharge focused and continues to request to be discharged. She reports that she desires to be discharged to her sister's home and follow up with [facility] outpatient. She was encouraged to remain on the inpatient unit and continue in the milieu and to follow up with the behavioral team regarding a solidified discharge plan with appropriate resources due to being pregnant. She endorses having ongoing depression and anxiety symptoms, but does report that her mood is improving. Patient admits to having nicotine  cravings, denies substance (cocaine) withdrawals, and appears more euthymic today. Patient states she is eating and sleeping well, about 8 to 9 hours at night. Patient denies current suicidal or homicidal ideations, or perceptual disturbances. Will continue Quetiapine  100 mg at bedtime for depression/mood, continue to monitor patient for efficacy and adverse reactions to the medication.  10/14/2024: Patient was seen this morning in her room for psychiatric reassessment during rounds.Patient is alert and oriented X 4, calm, cooperative, and engaged well in the interview. Patient reports she is here for substance detox, but she wants to be discharged. She endorses having severe depression and anxiety symptoms, and states being on the inpatient unit makes her feel worse. Patient admits to having cravings, denies withdrawals, but  appears irritable as she continues with the interview. She reports she is not opposed to receiving medication for depression. Patient states she is eating and sleeping well, about 7 to 8 hours at night. Patient denies current suicidal or homicidal ideations, or perceptual disturbances. Will begin Quetiapine  100 mg at bedtime for depression/mood, continue to monitor patient for efficacy and adverse reactions to the medication.  Patient is a 34 year old female who currently is homeless reportedly was living with a boyfriend of 4 to 5 years she reports that she also has challenges with cocaine abuse and other drug abuse and has not used recently but she reports that she was feeling overwhelmed and suicidal because of which she came to the emergency room continues to endorse suicidal thoughts Patient is admitted to adult psych unit with Q15 min safety monitoring. Multidisciplinary team approach is offered. Medication management; group/milieu therapy is offered.    Patient reports having argument with the boyfriend and has been homeless.  She did acknowledge doing cocaine and smoking cigarettes throughout the pregnancy.  She reports being pregnant for 22 weeks.  Reports this pregnancy being her fourth child.  She is unable to identify any support in the community.  She is minimizing her symptoms of depression and reports that she is feeling better now.  She denies feeling hopeless or worthless.  She reports having fair appetite and sleep.  She denies anxiety and reports intermittent panic attacks.  She denies auditory/visual hallucinations.  She reports use of cocaine intermittently.  Patient denies SI/HI/plan.  Patient denies nightmares or flashbacks.  Past Psychiatric History: see h&P Family History:  Family History  Problem Relation Age of Onset   Heart disease Father  Huntington's disease Mother    Social History:  Social History   Substance and Sexual Activity  Alcohol Use Not Currently    Alcohol/week: 0.0 standard drinks of alcohol     Social History   Substance and Sexual Activity  Drug Use Not Currently   Types: Cocaine, Marijuana    Social History   Socioeconomic History   Marital status: Single    Spouse name: Not on file   Number of children: 2   Years of education: Not on file   Highest education level: Not on file  Occupational History   Not on file  Tobacco Use   Smoking status: Every Day    Current packs/day: 0.50    Average packs/day: 0.5 packs/day for 7.0 years (3.5 ttl pk-yrs)    Types: Cigarettes   Smokeless tobacco: Never  Vaping Use   Vaping status: Never Used  Substance and Sexual Activity   Alcohol use: Not Currently    Alcohol/week: 0.0 standard drinks of alcohol   Drug use: Not Currently    Types: Cocaine, Marijuana   Sexual activity: Yes    Birth control/protection: None  Other Topics Concern   Not on file  Social History Narrative   Not on file   Social Drivers of Health   Tobacco Use: High Risk (10/12/2024)   Patient History    Smoking Tobacco Use: Every Day    Smokeless Tobacco Use: Never    Passive Exposure: Not on file  Financial Resource Strain: High Risk (08/26/2024)   Received from [facility]   Overall Financial Resource Strain (CARDIA)    How hard is it for you to pay for the very basics like food, housing, medical care, and heating?: Very hard  Food Insecurity: No Food Insecurity (10/12/2024)   Epic    Worried About Programme Researcher, Broadcasting/film/video in the Last Year: Never true    Ran Out of Food in the Last Year: Never true  Transportation Needs: Patient Declined (10/12/2024)   Epic    Lack of Transportation (Medical): Patient declined    Lack of Transportation (Non-Medical): Patient declined  Physical Activity: Insufficiently Active (08/26/2024)   Received from [facility]   Exercise Vital Sign    On average, how many days per week do you engage in moderate to strenuous exercise (like a brisk walk)?: 2 days    On  average, how many minutes do you engage in exercise at this level?: 10 min  Stress: Stress Concern Present (08/26/2024)   Received from [facility] - Occupational Stress Questionnaire    Do you feel stress - tense, restless, nervous, or anxious, or unable to sleep at night because your mind is troubled all the time - these days?: Very much  Social Connections: Moderately Isolated (08/26/2024)   Received from [facility]   Social Connection and Isolation Panel    In a typical week, how many times do you talk on the phone with family, friends, or neighbors?: Twice a week    How often do you get together with friends or relatives?: Twice a week    How often do you attend church or religious services?: Never    Do you belong to any clubs or organizations such as church groups, unions, fraternal or athletic groups, or school groups?: No    How often do you attend meetings of the clubs or organizations you belong to?: 1 to 4 times per year  Are you married, widowed, divorced, separated, never married, or living with a partner?: Never married  Depression (PHQ2-9): Not on file  Alcohol Screen: Low Risk (10/12/2024)   Alcohol Screen    Last Alcohol Screening Score (AUDIT): 0  Housing: Patient Declined (10/12/2024)   Epic    Unable to Pay for Housing in the Last Year: Patient declined    Number of Times Moved in the Last Year: Not on file    Homeless in the Last Year: Patient declined  Utilities: Not At Risk (10/12/2024)   Epic    Threatened with loss of utilities: No  Health Literacy: Low Risk (08/26/2024)   Received from [facility]    How often do you need to have someone help you when you read instructions, pamphlets, or other written material from your doctor or pharmacy?: Never   Past Medical History:  Past Medical History:  Diagnosis Date   Acute adjustment disorder with depressed mood 06/18/2023   ADHD    Anemia     BLOOD TRANSFUSION AFTER C-SECTION ON 06-2016-2 UNITS   Anxiety    Asthma    WELL CONTROLLED   B12 deficiency 06/24/2022   Bipolar 1 disorder, depressed (HCC)    COVID-19    Depression    Diabetes mellitus affecting pregnancy, unspecified trimester 09/19/2020   Diabetes mellitus without complication (HCC)    Gallstones    Gastroesophageal reflux disease 08/21/2010   GERD (gastroesophageal reflux disease)    Heart murmur    History of blood transfusion 06/2016   RECEIVED 2 UNITS OF BLOOD AFTER C SECTION   History of maternal blood transfusion, currently pregnant 04/19/2017   Malingering 05/03/2024   Menometrorrhagia 05/13/2022   Migraines    Ovarian cyst    Seizure disorder during pregnancy, third trimester (HCC) 05/10/2017   Seizures (HCC)    LAST SEIZURE January 2022   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   Smoking (tobacco) complicating pregnancy, unspecified trimester 05/10/2017   Substance induced mood disorder (HCC) 06/18/2023   Suicidal ideation 06/15/2013   Tubal pregnancy     Past Surgical History:  Procedure Laterality Date   CESAREAN SECTION N/A 06/22/2016   Procedure: CESAREAN SECTION;  Surgeon: Glory High, MD;  Location: [facility] ORS;  Service: Obstetrics;  Laterality: N/A;   CESAREAN SECTION N/A 10/12/2017   Procedure: CESAREAN SECTION;  Surgeon: High Glory, MD;  Location: [facility] ORS;  Service: Obstetrics;  Laterality: N/A;   CESAREAN SECTION N/A 03/27/2021   Procedure: CESAREAN SECTION;  Surgeon: Victor Claudell SAUNDERS, MD;  Location: [facility] ORS;  Service: Obstetrics;  Laterality: N/A;   CHOLECYSTECTOMY N/A 08/13/2016   Procedure: LAPAROSCOPIC CHOLECYSTECTOMY;  Surgeon: Laneta JULIANNA Luna, MD;  Location: [facility] ORS;  Service: General;  Laterality: N/A;   DILATION AND CURETTAGE OF UTERUS     ovarian cyst removed     SALPINGECTOMY Right 2013   ectopic pregnancy. PER PATIENT, STILL HAS BOTH TUBES   TONSILLECTOMY     TONSILLECTOMY AND ADENOIDECTOMY       Current Medications: Current Facility-Administered Medications  Medication Dose Route Frequency Provider Last Rate Last Admin   acetaminophen  (TYLENOL ) tablet 650 mg  650 mg Oral Q6H PRN Madaram, Kondal R, MD   650 mg at 10/14/24 2119   alum & mag hydroxide-simeth (MAALOX/MYLANTA) 200-200-20 MG/5ML suspension 30 mL  30 mL Oral Q4H PRN Madaram, Kondal R, MD       haloperidol  lactate (HALDOL ) injection 5 mg  5 mg Intramuscular TID  PRN Madaram, Kondal R, MD       And   diphenhydrAMINE  (BENADRYL ) injection 50 mg  50 mg Intramuscular TID PRN Madaram, Kondal R, MD       And   LORazepam  (ATIVAN ) injection 2 mg  2 mg Intramuscular TID PRN Madaram, Kondal R, MD       magnesium  hydroxide (MILK OF MAGNESIA) suspension 30 mL  30 mL Oral Daily PRN Madaram, Kondal R, MD       QUEtiapine  (SEROQUEL ) tablet 100 mg  100 mg Oral QHS Jhamari Markowicz J, NP   100 mg at 10/14/24 2119    Lab Results:  No results found for this or any previous visit (from the past 48 hours).   Blood Alcohol level:  Lab Results  Component Value Date   [facility] <15 10/12/2024   ETH <15 05/03/2024    Metabolic Disorder Labs: Lab Results  Component Value Date   HGBA1C 5.4 03/27/2021   MPG 108 03/27/2021   MPG 103 05/10/2017   Lab Results  Component Value Date   PROLACTIN 8.4 05/17/2019   No results found for: CHOL, TRIG, HDL, CHOLHDL, VLDL, LDLCALC  Physical Findings: AIMS:  , ,  ,  ,    CIWA:    COWS:      Psychiatric Specialty Exam:  Presentation  General Appearance:  Appropriate for Environment  Eye Contact: Fair  Speech: Normal Rate  Speech Volume: Decreased    Mood and Affect  Mood: Dysphoric; Depressed  Affect: Depressed; Flat   Thought Process  Thought Processes: Coherent  Orientation:Full (Time, Place and Person)  Thought Content:Illogical  Hallucinations:Hallucinations: None  Ideas of Reference:None  Suicidal Thoughts:Suicidal Thoughts: Yes,  Passive  Homicidal Thoughts:Homicidal Thoughts: No   Sensorium  Memory: Immediate Fair  Judgment: Impaired  Insight: Shallow   Executive Functions  Concentration: Fair  Attention Span: Fair  Recall: [employer] of Knowledge: Fair  Language: Fair   Psychomotor Activity  Psychomotor Activity: Psychomotor Activity: Normal  Musculoskeletal: Strength & Muscle Tone: within normal limits Gait & Station: normal Assets  Assets: Manufacturing Systems Engineer; Desire for Improvement; Resilience    Physical Exam: Physical Exam ROS Blood pressure (!) 104/54, pulse 87, temperature 98 F (36.7 C), temperature source Oral, resp. rate 18, height 5' (1.524 m), weight 108.9 kg, SpO2 98%. Body mass index is 46.87 kg/m.  Diagnosis: Principal Problem:   Adjustment disorder with emotional disturbance   PLAN: Safety and Monitoring:  -- Voluntary admission to inpatient psychiatric unit for safety, stabilization and treatment  -- Daily contact with patient to assess and evaluate symptoms and progress in treatment  -- Patient's case to be discussed in multi-disciplinary team meeting  -- Observation Level : q15 minute checks  -- Vital signs:  q12 hours  -- Precautions: suicide, elopement, and assault -- Encouraged patient to participate in unit milieu and in scheduled group therapies  2. Psychiatric Treatment:  Scheduled Medications:  -Continue Quetiapine  100 mg QHS for depression/mood.   -- The risks/benefits/side-effects/alternatives to this medication were discussed in detail with the patient and time was given for questions. The patient consents to medication trial.  3. Medical Issues Being Addressed:   -Patient is at [redacted] weeks gestation.  4. Discharge Planning:   -- Social work and case management to assist with discharge planning and identification of hospital follow-up needs prior to discharge  -- Estimated LOS: 3-4 days   -- Discharge Concerns: Need to establish a safety  plan; Medication compliance and effectiveness.             --  Discharge Goals: Return home with outpatient referrals follow ups  Camelia JINNY Mountain, NP 10/15/2024, 1:26 PM

## 2024-10-15 NOTE — Progress Notes (Signed)
" °   10/15/24 1000  Psych Admission Type (Psych Patients Only)  Admission Status Voluntary  Psychosocial Assessment  Patient Complaints None  Eye Contact Fair  Facial Expression Other (Comment) (WNL)  Affect Appropriate to circumstance  Speech Logical/coherent  Interaction Assertive  Motor Activity Slow  Appearance/Hygiene Revealing clothes/seductive clothing  Behavior Characteristics Cooperative;Appropriate to situation  Mood Pleasant  Thought Process  Coherency WDL  Content WDL  Delusions None reported or observed  Perception WDL  Hallucination None reported or observed  Judgment WDL  Confusion WDL  Danger to Self  Current suicidal ideation? Denies  Agreement Not to Harm Self Yes  Description of Agreement verbal  Danger to Others  Danger to Others None reported or observed    "

## 2024-10-15 NOTE — Group Note (Signed)
 Date:  10/15/2024 Time:  8:48 PM  Group Topic/Focus:  Managing Feelings:   The focus of this group is to identify what feelings patients have difficulty handling and develop a plan to handle them in a healthier way upon discharge. Self Care:   The focus of this group is to help patients understand the importance of self-care in order to improve or restore emotional, physical, spiritual, interpersonal, and financial health. Wrap-Up Group:   The focus of this group is to help patients review their daily goal of treatment and discuss progress on daily workbooks.    Participation Level:  Active  Participation Quality:  Appropriate and Attentive  Affect:  Appropriate  Cognitive:  Alert, Appropriate, and Oriented  Insight: Appropriate and Good  Engagement in Group:  Engaged  Modes of Intervention:  Discussion and Support  Additional Comments:  N/A  [name] 10/15/2024, 8:48 PM

## 2024-10-15 NOTE — Consult Note (Incomplete)
 " Consult History and Physical   SERVICE: Obstetrics  Patient Name: Faith [name] Patient MRN:   [id number]  CC: OB Consult for OB recommendations for patient who is admitted with suicidal ideation, adjustment disorder and hx of polysubstance abuse.  HPI: Faith [name] is a 34 y.o. [id number] at 22+0 weeks who was been admitted to [facility] 10/12/14 for psychiatric stabilization and treatment in the setting of suicidal ideation. She is currently homeless. Has a hx of cocaine use as well as tobacco use. She has a relationship with the FOB, and says she is well treated and safe in that relationship although she corrected herself when she initially called him her boyfriend and stated he's her baby's father.  She reports being pregnant with a baby girl. Has been feeling fetal movement x 2 weeks. She denies abdominal pain, bleeding, or change in vaginal discharge. She has had three c-sections.  She also reports a history of labile blood pressure and blood sugars. She has a seizure disorder and is not currently on any seizure medications. She has been on Keppra  in the past. Her last seizure was earlier this month, prior to admission.  She reports a history of type 2 diabetes. Since hospital admission, she has not had fingerstick blood sugars. She says she usually takes Metformin  500mg  qAM. Record review looks like it's prescribed bid. She is not currently on any regimen.  She has a history of anemia. She has been prescribed ferrous sulfate  325mg  and prenatal vitamins, which she has not been taking since hospital admission. She has an intolerance to IV iron  (Venofer ).  She reports that her team is trying to get her transferred to the [facility] program.    Review of Systems: positives in bold GEN:   fevers, chills, weight changes, appetite changes, fatigue, night sweats HEENT:  HA, vision changes, hearing loss, congestion, rhinorrhea, sinus pressure, dysphagia CV:   CP, palpitations PULM:   SOB, cough GI:  abd pain, N/V/D/C GU:  dysuria, urgency, frequency MSK:  arthralgias, myalgias, back pain, swelling SKIN:  rashes, color changes, pallor NEURO:  numbness, weakness, tingling, seizures, dizziness, tremors PSYCH:  depression, anxiety, behavioral problems, confusion  HEME/LYMPH:  easy bruising or bleeding ENDO:  heat/cold intolerance  Past Obstetrical History: OB History     Gravida  7   Para  3   Term  3   Preterm      AB  3   Living  3      SAB  2   IAB  0   Ectopic  1   Multiple      Live Births  3         Hx C/s x3  Past Gynecologic History: No LMP recorded. Patient is pregnant.   Past Medical History: Past Medical History:  Diagnosis Date   Acute adjustment disorder with depressed mood 06/18/2023   ADHD    Anemia    BLOOD TRANSFUSION AFTER C-SECTION ON 06-2016-2 UNITS   Anxiety    Asthma    WELL CONTROLLED   B12 deficiency 06/24/2022   Bipolar 1 disorder, depressed (HCC)    COVID-19    Depression    Diabetes mellitus affecting pregnancy, unspecified trimester 09/19/2020   Diabetes mellitus without complication (HCC)    Gallstones    Gastroesophageal reflux disease 08/21/2010   GERD (gastroesophageal reflux disease)    Heart murmur    History of blood transfusion 06/2016   RECEIVED 2 UNITS OF BLOOD AFTER C SECTION  History of maternal blood transfusion, currently pregnant 04/19/2017   Malingering 05/03/2024   Menometrorrhagia 05/13/2022   Migraines    Ovarian cyst    Seizure disorder during pregnancy, third trimester (HCC) 05/10/2017   Seizures (HCC)    LAST SEIZURE January 2022   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   Smoking (tobacco) complicating pregnancy, unspecified trimester 05/10/2017   Substance induced mood disorder (HCC) 06/18/2023   Suicidal ideation 06/15/2013   Tubal pregnancy     Past Surgical History:   Past Surgical History:  Procedure Laterality Date   CESAREAN SECTION N/A 06/22/2016    Procedure: CESAREAN SECTION;  Surgeon: Glory High, MD;  Location: [facility] ORS;  Service: Obstetrics;  Laterality: N/A;   CESAREAN SECTION N/A 10/12/2017   Procedure: CESAREAN SECTION;  Surgeon: High Glory, MD;  Location: [facility] ORS;  Service: Obstetrics;  Laterality: N/A;   CESAREAN SECTION N/A 03/27/2021   Procedure: CESAREAN SECTION;  Surgeon: Victor Claudell SAUNDERS, MD;  Location: [facility] ORS;  Service: Obstetrics;  Laterality: N/A;   CHOLECYSTECTOMY N/A 08/13/2016   Procedure: LAPAROSCOPIC CHOLECYSTECTOMY;  Surgeon: Laneta JULIANNA Luna, MD;  Location: [facility] ORS;  Service: General;  Laterality: N/A;   DILATION AND CURETTAGE OF UTERUS     ovarian cyst removed     SALPINGECTOMY Right 2013   ectopic pregnancy. PER PATIENT, STILL HAS BOTH TUBES   TONSILLECTOMY     TONSILLECTOMY AND ADENOIDECTOMY      Family History:  family history includes Heart disease in her father; Huntington's disease in her mother.  Social History:  Social History   Socioeconomic History   Marital status: Single    Spouse name: Not on file   Number of children: 2   Years of education: Not on file   Highest education level: Not on file  Occupational History   Not on file  Tobacco Use   Smoking status: Every Day    Current packs/day: 0.50    Average packs/day: 0.5 packs/day for 7.0 years (3.5 ttl pk-yrs)    Types: Cigarettes   Smokeless tobacco: Never  Vaping Use   Vaping status: Never Used  Substance and Sexual Activity   Alcohol use: Not Currently    Alcohol/week: 0.0 standard drinks of alcohol   Drug use: Not Currently    Types: Cocaine, Marijuana   Sexual activity: Yes    Birth control/protection: None  Other Topics Concern   Not on file  Social History Narrative   Not on file   Social Drivers of Health   Tobacco Use: High Risk (10/12/2024)   Patient History    Smoking Tobacco Use: Every Day    Smokeless Tobacco Use: Never    Passive Exposure: Not on file  Financial Resource Strain: High Risk  (08/26/2024)   Received from [facility]   Overall Financial Resource Strain (CARDIA)    How hard is it for you to pay for the very basics like food, housing, medical care, and heating?: Very hard  Food Insecurity: No Food Insecurity (10/12/2024)   Epic    Worried About Radiation Protection Practitioner of Food in the Last Year: Never true    Ran Out of Food in the Last Year: Never true  Transportation Needs: Patient Declined (10/12/2024)   Epic    Lack of Transportation (Medical): Patient declined    Lack of Transportation (Non-Medical): Patient declined  Physical Activity: Insufficiently Active (08/26/2024)   Received from [facility]   Exercise Vital Sign    On average, how  many days per week do you engage in moderate to strenuous exercise (like a brisk walk)?: 2 days    On average, how many minutes do you engage in exercise at this level?: 10 min  Stress: Stress Concern Present (08/26/2024)   Received from [facility] - Occupational Stress Questionnaire    Do you feel stress - tense, restless, nervous, or anxious, or unable to sleep at night because your mind is troubled all the time - these days?: Very much  Social Connections: Moderately Isolated (08/26/2024)   [facility]   Social Connection and Isolation Panel    In a typical week, how many times do you talk on the phone with family, friends, or neighbors?: Twice a week    How often do you get together with friends or relatives?: Twice a week    How often do you attend church or religious services?: Never    Do you belong to any clubs or organizations such as church groups, unions, fraternal or athletic groups, or school groups?: No    How often do you attend meetings of the clubs or organizations you belong to?: 1 to 4 times per year    Are you married, widowed, divorced, separated, never married, or living with a partner?: Never married  Intimate Partner Violence: Patient Declined  (10/12/2024)   Epic    Fear of Current or Ex-Partner: Patient declined    Emotionally Abused: Patient declined    Physically Abused: Patient declined    Sexually Abused: Patient declined  Depression (PHQ2-9): Not on file  Alcohol Screen: Low Risk (10/12/2024)   Alcohol Screen    Last Alcohol Screening Score (AUDIT): 0  Housing: Patient Declined (10/12/2024)   Epic    Unable to Pay for Housing in the Last Year: Patient declined    Number of Times Moved in the Last Year: Not on file    Homeless in the Last Year: Patient declined  Utilities: Not At Risk (10/12/2024)   Epic    Threatened with loss of utilities: No  Health Literacy: Low Risk (08/26/2024)   Received from [facility]    How often do you need to have someone help you when you read instructions, pamphlets, or other written material from your doctor or pharmacy?: Never    Home Medications:  Medications reconciled in EPIC  Medications Ordered Prior to Encounter[1]  Allergies:  Allergies[2]  Physical Exam:  Temp:  [98 F (36.7 C)] 98 F (36.7 C) (01/11 0613) Pulse Rate:  [83-87] 83 (01/11 1600) Resp:  [18] 18 (01/11 0613) BP: (104-125)/(54-68) 125/68 (01/11 1600) SpO2:  [98 %-99 %] 99 % (01/11 1600)   General Appearance:  Well developed, well nourished, no acute distress, alert and oriented, cooperative and appears stated age Cardiovascular:  Well perfused Pulmonary:  Normal WOB Abdomen:  Obese, soft, nontender, nondistended, no abnormal masses or organomegaly, no epigastric pain. Uterus difficult to outline through pannus, but it is palpable.  Extremities:  extremities normal, no tenderness, atraumatic, no cyanosis or edema Skin:  normal coloration and turgor, Healing boil to the right of umbilicus. Psychiatric:  Normal mood and affect, appropriate Pelvic:  Deferred  FHT: 150's  Labs/Studies:   CBC and Coags:  Lab Results  Component Value Date   WBC 10.8 (H) 10/12/2024   NEUTOPHILPCT 78  10/07/2024   EOSPCT 1 10/07/2024   BASOPCT 0 10/07/2024   LYMPHOPCT 15 10/07/2024  HGB 9.0 (L) 10/12/2024   HCT 30.5 (L) 10/12/2024   MCV 70.1 (L) 10/12/2024   PLT 325 10/12/2024   INR 1.1 05/03/2024   CMP:  Lab Results  Component Value Date   NA 136 10/12/2024   K 3.4 (L) 10/12/2024   CL 104 10/12/2024   CO2 19 (L) 10/12/2024   BUN 6 10/12/2024   CREATININE 0.42 (L) 10/12/2024   CREATININE 0.37 (L) 10/07/2024   CREATININE 0.44 09/18/2024   PROT 6.5 10/12/2024   BILITOT <0.2 10/12/2024   BILIDIR <0.1 (L) 03/13/2018   ALT 8 10/12/2024   AST <10 (L) 10/12/2024   ALKPHOS 77 10/12/2024    Assessment / Plan:   SHONTAVIA MICKEL is a 34 y.o. [id number] who is admitted for psychiatric stabilization with suicidal ideation. She has an extensive psychiatric history including Bipolar 1, anxiety, ADHD. She also has a history of seizure disorder, cocaine and tobacco use.   1. Fetal wellbeing: Has had anatomy scan with MFM that was WNL, posterior/ fundal placenta. A fetal echo due to Type 2 DM and medication/ drug exposures has been ordered but not scheduled. She has a follow up MFM appointment on 11/01/24. FHT today 150's. Uterus is difficult to outline due to habitus, but there was normal growth at anatomy scan on 12/29. She feels expected fetal movement. She has no OB complaints.   2. Anemia: Restart Prenatal vitamin. Add Ferrous Sulfate  325mg  every other day to minimize GI side effects. Hgb on admission was 9.0  3. Type 2 DM: Start fasting fingerstick blood sugars qAM, and 2 hrs after each meal. Restart Metformin  500mg  every day as patient says this is what she takes at home. Will likely need to increase dose to at least bid, but will hold off until we get some blood sugar readings. Hemoglobin A1C on admission was 5.3%.  4. Seizure disorder: Recommend Neurology consult for recommendations re: seizure medications. Keppra  is used in pregnancy, and she has been on that in the past. They would  need to provide guidance re: initiation and maintenance.  Thank you for the opportunity to be involved with this patient's care.  ----- Lauraine Lakes, CNM [facility]      [1]  No current facility-administered medications on file prior to encounter.   Current Outpatient Medications on File Prior to Encounter  Medication Sig Dispense Refill   Accu-Chek Softclix Lancets lancets Use as instructed (Patient not taking: No sig reported) 100 each 12   Blood Glucose Monitoring Suppl (ACCU-CHEK GUIDE) w/Device KIT 1 Device by Does not apply route in the morning, at noon, in the evening, and at bedtime. (Patient not taking: No sig reported) 1 kit 0   DULoxetine (CYMBALTA) 30 MG capsule Take 30 mg by mouth 2 (two) times daily.     esomeprazole (NEXIUM) 40 MG capsule Take 40 mg by mouth daily at 12 noon.     ferrous sulfate  325 (65 FE) MG tablet Take 1 tablet (325 mg total) by mouth daily. 30 tablet 0   Glucose Blood (BLOOD GLUCOSE TEST STRIPS) STRP 1 each by Does not apply route as directed. Dispense based on patient and insurance preference. Use up to four times daily as directed. (FOR ICD-10 E10.9, E11.9). (Patient not taking: No sig reported) 100 strip 0   levETIRAcetam  (KEPPRA ) 500 MG tablet Take 1 tablet (500 mg total) by mouth 2 (two) times daily. 60 tablet 0   metFORMIN  (GLUCOPHAGE ) 500 MG  tablet Take 1 tablet (500 mg total) by mouth 2 (two) times daily with a meal. 60 tablet 2   metroNIDAZOLE  (FLAGYL ) 500 MG tablet Take 1 tablet (500 mg total) by mouth 2 (two) times daily. 14 tablet 0   nitrofurantoin , macrocrystal-monohydrate, (MACROBID ) 100 MG capsule Take 1 capsule (100 mg total) by mouth 2 (two) times daily. 14 capsule 0   nystatin  (MYCOSTATIN /NYSTOP ) powder Apply 1 Application topically 3 (three) times daily. 15 g 0   ondansetron  (ZOFRAN -ODT) 4 MG disintegrating tablet Take 1 tablet (4 mg total) by mouth every 8 (eight) hours  as needed for nausea. 42 tablet 2   Prenatal Vit-Fe Fumarate-FA (MULTIVITAMIN-PRENATAL) 27-0.8 MG TABS tablet Take 1 tablet by mouth daily at 12 noon.     scopolamine  (TRANSDERM-SCOP) 1 MG/3DAYS Place 1 patch (1 mg total) onto the skin every 3 (three) days. 12 patch 3   terconazole  (TERAZOL 3 ) 0.8 % vaginal cream Place 1 applicator vaginally at bedtime. 20 g 0   VENTOLIN  HFA 108 (90 Base) MCG/ACT inhaler Inhale 2 puffs into the lungs. Every 4-6 hours for wheezing, cough or shortness of breath (Patient not taking: Reported on 10/04/2024)    [2]  Allergies Allergen Reactions   Amipaque [Metrizamide] Anaphylaxis   Azulfidine [Sulfasalazine] Hives   Ivp Dye [Iodinated Contrast Media] Anaphylaxis    Per patient, she does not have problems with betadine   Latex Rash   Ms Contin [Morphine] Other (See Comments)    Bradycardia    Mushroom Extract Complex (Obsolete) Anaphylaxis, Hives, Shortness Of Breath, Swelling and Other (See Comments)    Throat swelling   Neurontin  [Gabapentin ] Hives   Tomato Anaphylaxis   Toradol [Ketorolac Tromethamine] Anaphylaxis, Hives and Swelling   Asa [Aspirin] Hives   Dilaudid  [Hydromorphone  Hcl] Hives   Lamictal [Lamotrigine] Hives   Sulfa Antibiotics Hives   Ultram [Tramadol] Hives, Swelling and Rash   Venofer  [Iron  Sucrose] Other (See Comments)    Chest pain Warm sensation Hypotension    Adhesive [Tape] Itching    Paper tape OK   Iohexol  Rash   "

## 2024-10-15 NOTE — Group Note (Signed)
 Date:  10/15/2024 Time:  12:01 PM  Group Topic/Focus:  Healthy Communication:   The focus of this group is to discuss communication, barriers to communication, as well as healthy ways to communicate with others.    Participation Level:  Active  Participation Quality:  Appropriate  Affect:  Appropriate  Cognitive:  Appropriate  Insight: Appropriate  Engagement in Group:  Engaged  Modes of Intervention:  Activity  Additional Comments:    Faith [name] 10/15/2024, 12:01 PM

## 2024-10-15 NOTE — Plan of Care (Signed)
   Problem: Education: Goal: Emotional status will improve Outcome: Progressing Goal: Mental status will improve Outcome: Progressing

## 2024-10-15 NOTE — Plan of Care (Signed)
°  Problem: Education: Goal: Emotional status will improve Outcome: Progressing   Problem: Education: Goal: Mental status will improve Outcome: Progressing   Problem: Education: Goal: Knowledge of [facility] Education information/materials will improve Outcome: Progressing

## 2024-10-16 DIAGNOSIS — F4329 Adjustment disorder with other symptoms: Secondary | ICD-10-CM | POA: Diagnosis not present

## 2024-10-16 LAB — GLUCOSE, CAPILLARY
Glucose-Capillary: 116 mg/dL — ABNORMAL HIGH (ref 70–99)
Glucose-Capillary: 98 mg/dL (ref 70–99)

## 2024-10-16 NOTE — Group Note (Signed)
 Recreation Therapy Group Note   Group Topic:Coping Skills  Group Date: 10/16/2024 Start Time: 1530 End Time: 1605 Facilitators: Celestia Jeoffrey BRAVO, LRT, CTRS Location: Courtyard  Group Description: [employer]. LRT and patients played games of basketball, drew with chalk, and played corn hole while outside in the courtyard while getting fresh air and sunlight. Music was being played in the background. LRT and peers conversed about different games they have played before, what they do in their free time and anything else that is on their minds. LRT encouraged pts to drink water after being outside, sweating and getting their heart rate up.  Goal Area(s) Addressed: Patient will build on frustration tolerance skills. Patients will partake in a competitive play game with peers. Patients will gain knowledge of new leisure interest/hobby   Affect/Mood: N/A   Participation Level: Did not attend    Clinical Observations/Individualized Feedback: Patient did not attend.  Plan: Continue to engage patient in RT group sessions 2-3x/week.   Jeoffrey BRAVO Celestia, LRT, CTRS 10/16/2024 5:18 PM

## 2024-10-16 NOTE — Group Note (Unsigned)
 Date:  10/16/2024 Time:  10:51 AM  Group Topic/Focus:  Self Care:   The focus of this group is to help patients understand the importance of self-care in order to improve or restore emotional, physical, spiritual, interpersonal, and financial health.     Participation Level:  {BHH PARTICIPATION OZCZO:77735}  Participation Quality:  {BHH PARTICIPATION QUALITY:22265}  Affect:  {BHH AFFECT:22266}  Cognitive:  {BHH COGNITIVE:22267}  Insight: {BHH Insight2:20797}  Engagement in Group:  {BHH ENGAGEMENT IN HMNLE:77731}  Modes of Intervention:  {BHH MODES OF INTERVENTION:22269}  Additional Comments:  ***  Faith [name] 10/16/2024, 10:51 AM

## 2024-10-16 NOTE — Plan of Care (Signed)
  Problem: Education: Goal: Emotional status will improve Outcome: Progressing   Problem: Education: Goal: Mental status will improve Outcome: Progressing   Problem: Education: Goal: Verbalization of understanding the information provided will improve Outcome: Progressing   

## 2024-10-16 NOTE — Progress Notes (Signed)
" °   10/16/24 0553  Psych Admission Type (Psych Patients Only)  Admission Status Voluntary  Psychosocial Assessment  Patient Complaints None  Eye Contact Fair  Facial Expression Flat  Affect Appropriate to circumstance  Speech Logical/coherent  Interaction Assertive  Motor Activity Slow  Appearance/Hygiene Improved  Behavior Characteristics Cooperative;Appropriate to situation  Mood Pleasant  Thought Process  Coherency WDL  Content WDL  Delusions WDL  Perception WDL  Hallucination None reported or observed  Judgment WDL  Confusion WDL  Danger to Self  Current suicidal ideation? Denies  Agreement Not to Harm Self Yes  Description of Agreement verbal  Danger to Others  Danger to Others None reported or observed   No distress noted, she is interacting appropriately with peers and staff, denies SI/HI/AVH, thoughts are linear and coherent, 15 minutes safety checks maintained.  "

## 2024-10-16 NOTE — Group Note (Signed)
 Date:  10/16/2024 Time:  9:59 PM  Group Topic/Focus:  Coping With Mental Health Crisis:   The purpose of this group is to help patients identify strategies for coping with mental health crisis.  Group discusses possible causes of crisis and ways to manage them effectively. Wrap-Up Group:   The focus of this group is to help patients review their daily goal of treatment and discuss progress on daily workbooks.    Participation Level:  Active  Participation Quality:  Appropriate and Attentive  Affect:  Appropriate  Cognitive:  Alert and Appropriate  Insight: Appropriate and Good  Engagement in Group:  Engaged  Modes of Intervention:  Education  Additional Comments:     Faith [name] Servant 10/16/2024, 9:59 PM

## 2024-10-16 NOTE — Progress Notes (Signed)
 "                                                     Consult Note  Requesting Attending Physician :  Ruther Millie SAUNDERS, MD  Assessment/Recommendations:  Faith [name] is a 34 y.o. female seen in consultation at the request of Madaram, Millie SAUNDERS, MD regarding management of 22 week pregnancy during IVC.   1)FHTs today in the 150s. Reassuring for gestational age. Faith [name] reports frequent fetal movement and no signs of PTL. Will obtain FHTs daily while inpatient.  2)Type 2 DM: She is reporting dizziness today worse after she ate breakfast. POCT glucose testing has been ordered but not collected as of this note. Message sent to team to encourage monitoring of blood sugars so we can adjust medication as needed.  3)Seizure Disorder: Would recommend consulting with neurology to possibly restart Keppra .   Principal Problem:   Adjustment disorder with emotional disturbance  Allergies: Allergies[1]   Medications:  Current Medications[2]   Medical History: Past Medical History:  Diagnosis Date   Acute adjustment disorder with depressed mood 06/18/2023   ADHD    Anemia    BLOOD TRANSFUSION AFTER C-SECTION ON 06-2016-2 UNITS   Anxiety    Asthma    WELL CONTROLLED   B12 deficiency 06/24/2022   Bipolar 1 disorder, depressed (HCC)    COVID-19    Depression    Diabetes mellitus affecting pregnancy, unspecified trimester 09/19/2020   Diabetes mellitus without complication (HCC)    Gallstones    Gastroesophageal reflux disease 08/21/2010   GERD (gastroesophageal reflux disease)    Heart murmur    History of blood transfusion 06/2016   RECEIVED 2 UNITS OF BLOOD AFTER C SECTION   History of maternal blood transfusion, currently pregnant 04/19/2017   Malingering 05/03/2024   Menometrorrhagia 05/13/2022   Migraines    Ovarian cyst    Seizure disorder during pregnancy, third trimester (HCC) 05/10/2017   Seizures (HCC)    LAST SEIZURE January 2022   Severe stimulant use disorder (HCC)  06/18/2023   Smoking (tobacco) complicating pregnancy, unspecified trimester 05/10/2017   Substance induced mood disorder (HCC) 06/18/2023   Suicidal ideation 06/15/2013   Tubal pregnancy      Surgical History: Past Surgical History:  Procedure Laterality Date   CESAREAN SECTION N/A 06/22/2016   Procedure: CESAREAN SECTION;  Surgeon: Glory High, MD;  Location: [facility];  Service: Obstetrics;  Laterality: N/A;   CESAREAN SECTION N/A 10/12/2017   Procedure: CESAREAN SECTION;  Surgeon: High Glory, MD;  Location: [facility];  Service: Obstetrics;  Laterality: N/A;   CESAREAN SECTION N/A 03/27/2021   Procedure: CESAREAN SECTION;  Surgeon: Victor Claudell SAUNDERS, MD;  Location: [facility];  Service: Obstetrics;  Laterality: N/A;   CHOLECYSTECTOMY N/A 08/13/2016   Procedure: LAPAROSCOPIC CHOLECYSTECTOMY;  Surgeon: Laneta JULIANNA Luna, MD;  Location: [facility];  Service: General;  Laterality: N/A;   DILATION AND CURETTAGE OF UTERUS     ovarian cyst removed     SALPINGECTOMY Right 2013   ectopic pregnancy. PER PATIENT, STILL HAS BOTH TUBES   TONSILLECTOMY     TONSILLECTOMY AND ADENOIDECTOMY       Obstetric History:  OB History  Gravida Para Term Preterm AB Living  7 3 3  3 3   SAB IAB Ectopic Multiple Live Births  2  0 1  3    # Outcome Date GA Lbr Len/2nd Weight Sex Type Anes PTL Lv  7 Current           6 Term 03/27/21 [redacted]w[redacted]d  3900 g M CS-LTranv Spinal  LIV  5 Term 10/12/17 [redacted]w[redacted]d  4360 g F CS-LTranv Spinal  LIV  4 Term 06/22/16 [redacted]w[redacted]d  3830 g M CS-LTranv Spinal, EPI  LIV  3 SAB 2014 [redacted]w[redacted]d         2 SAB 2012 [redacted]w[redacted]d         1 Ectopic              Social History: Social History   Socioeconomic History   Marital status: Single    Spouse name: Not on file   Number of children: 2   Years of education: Not on file   Highest education level: Not on file  Occupational History   Not on file  Tobacco Use   Smoking status: Every Day    Current packs/day: 0.50    Average packs/day: 0.5  packs/day for 7.0 years (3.5 ttl pk-yrs)    Types: Cigarettes   Smokeless tobacco: Never  Vaping Use   Vaping status: Never Used  Substance and Sexual Activity   Alcohol use: Not Currently    Alcohol/week: 0.0 standard drinks of alcohol   Drug use: Not Currently    Types: Cocaine, Marijuana   Sexual activity: Yes    Birth control/protection: None  Other Topics Concern   Not on file  Social History Narrative   Not on file   Social Drivers of Health   Tobacco Use: High Risk (10/12/2024)   Patient History    Smoking Tobacco Use: Every Day    Smokeless Tobacco Use: Never    Passive Exposure: Not on file  Financial Resource Strain: High Risk (08/26/2024)   Received from [facility]   Overall Financial Resource Strain (CARDIA)    How hard is it for you to pay for the very basics like food, housing, medical care, and heating?: Very hard  Food Insecurity: No Food Insecurity (10/12/2024)   Epic    Worried About Programme Researcher, Broadcasting/film/video in the Last Year: Never true    Ran Out of Food in the Last Year: Never true  Transportation Needs: Patient Declined (10/12/2024)   Epic    Lack of Transportation (Medical): Patient declined    Lack of Transportation (Non-Medical): Patient declined  Physical Activity: Insufficiently Active (08/26/2024)   Received from [facility]   Exercise Vital Sign    On average, how many days per week do you engage in moderate to strenuous exercise (like a brisk walk)?: 2 days    On average, how many minutes do you engage in exercise at this level?: 10 min  Stress: Stress Concern Present (08/26/2024)   Received from [facility] - Occupational Stress Questionnaire    Do you feel stress - tense, restless, nervous, or anxious, or unable to sleep at night because your mind is troubled all the time - these days?: Very much  Social Connections: Moderately Isolated (08/26/2024)   Received from [facility]   Social  Connection and Isolation Panel    In a typical week, how many times do you talk on the phone with family, friends, or neighbors?: Twice a week    How often do you get together with friends or relatives?: Twice a week  How often do you attend church or religious services?: Never    Do you belong to any clubs or organizations such as church groups, unions, fraternal or athletic groups, or school groups?: No    How often do you attend meetings of the clubs or organizations you belong to?: 1 to 4 times per year    Are you married, widowed, divorced, separated, never married, or living with a partner?: Never married  Depression (PHQ2-9): Not on file  Alcohol Screen: Low Risk (10/12/2024)   Alcohol Screen    Last Alcohol Screening Score (AUDIT): 0  Housing: Patient Declined (10/12/2024)   Epic    Unable to Pay for Housing in the Last Year: Patient declined    Number of Times Moved in the Last Year: Not on file    Homeless in the Last Year: Patient declined  Utilities: Not At Risk (10/12/2024)   Epic    Threatened with loss of utilities: No  Health Literacy: Low Risk (08/26/2024)   Received from [facility]    How often do you need to have someone help you when you read instructions, pamphlets, or other written material from your doctor or pharmacy?: Never    Family History: family history includes Heart disease in her father; Huntington's disease in her mother.  Review of Systems: ROS   Objective :  Vital signs in last 24 hours: Temp:  [98.1 F (36.7 C)-98.8 F (37.1 C)] 98.1 F (36.7 C) (01/12 1051) Pulse Rate:  [83-93] 90 (01/12 1051) Resp:  [16-18] 18 (01/12 1051) BP: (105-125)/(58-73) 105/58 (01/12 1051) SpO2:  [98 %-100 %] 100 % (01/12 1051)  Intake/Output last 3 shifts: No intake/output data recorded.   Physical Exam: Pelvic exam: Gravid, non-tender.   Dr. Starla aware of patient and POC.    Faith [name], CNM  10/16/2024 12:24 PM      [1]  Allergies Allergen Reactions   Amipaque [Metrizamide] Anaphylaxis   Azulfidine [Sulfasalazine] Hives   Ivp Dye [Iodinated Contrast Media] Anaphylaxis    Per patient, she does not have problems with betadine   Latex Rash   Ms Contin [Morphine] Other (See Comments)    Bradycardia    Mushroom Extract Complex (Obsolete) Anaphylaxis, Hives, Shortness Of Breath, Swelling and Other (See Comments)    Throat swelling   Neurontin  [Gabapentin ] Hives   Tomato Anaphylaxis   Toradol [Ketorolac Tromethamine] Anaphylaxis, Hives and Swelling   Asa [Aspirin] Hives   Dilaudid  [Hydromorphone  Hcl] Hives   Lamictal [Lamotrigine] Hives   Sulfa Antibiotics Hives   Ultram [Tramadol] Hives, Swelling and Rash   Venofer  [Iron  Sucrose] Other (See Comments)    Chest pain Warm sensation Hypotension    Adhesive [Tape] Itching    Paper tape OK   Iohexol  Rash  [2]  Current Facility-Administered Medications:    acetaminophen  (TYLENOL ) tablet 650 mg, 650 mg, Oral, Q6H PRN, Madaram, Kondal R, MD, 650 mg at 10/14/24 2119   alum & mag hydroxide-simeth (MAALOX/MYLANTA) 200-200-20 MG/5ML suspension 30 mL, 30 mL, Oral, Q4H PRN, Madaram, Kondal R, MD   haloperidol  lactate (HALDOL ) injection 5 mg, 5 mg, Intramuscular, TID PRN **AND** diphenhydrAMINE  (BENADRYL ) injection 50 mg, 50 mg, Intramuscular, TID PRN **AND** LORazepam  (ATIVAN ) injection 2 mg, 2 mg, Intramuscular, TID PRN, Madaram, Kondal R, MD   ferrous sulfate  tablet 325 mg, 325 mg, Oral, QODAY, Madaram, Kondal R, MD, 325 mg at 10/15/24 2110   magnesium  hydroxide (MILK OF MAGNESIA) suspension 30 mL, 30 mL, Oral, Daily  PRN, Madaram, Kondal R, MD   metFORMIN  (GLUCOPHAGE -XR) 24 hr tablet 500 mg, 500 mg, Oral, Q breakfast, Charma Domino, CNM, 500 mg at 10/16/24 0947   prenatal vitamin w/FE, FA (NATACHEW) chewable tablet 1 tablet, 1 tablet, Oral, Q1200, Charma Domino, CNM   QUEtiapine  (SEROQUEL ) tablet 100 mg, 100 mg, Oral, QHS, Montague, Crystal J, NP, 100 mg at  10/15/24 2110  "

## 2024-10-16 NOTE — Group Note (Signed)
 Recreation Therapy Group Note   Group Topic:Healthy Support Systems  Group Date: 10/16/2024 Start Time: 1015 End Time: 1110 Facilitators: Celestia Jeoffrey BRAVO, LRT, CTRS Location: Craft Room  Group Description: Straw Bridge. In groups or individually, patients were given 10 plastic drinking straws and an equal length of masking tape. Using the materials provided, patients were instructed to build a free-standing bridge-like structure to suspend an everyday item (ex: deck of cards) off the floor or table surface. All materials were required to be used in secondary school teacher. LRT facilitated post-activity discussion reviewing the importance of having strong and healthy support systems in our lives. LRT discussed how the people in our lives serve as the tape and the deck of cards we placed on top of our straw structure are the stressors we face in daily life. LRT and pts discussed what happens in our life when things get too heavy for us , and we don't have strong supports outside of the hospital. Pt shared 2 of their healthy supports in their life aloud in the group.   Goal Area(s) Addressed:  Patient will identify 2 healthy supports in their life. Patient will identify skills to successfully complete activity. Patient will identify correlation of this activity to life post-discharge.  Patient will build on frustration tolerance skills. Patient will increase team building and communication skills.    Affect/Mood: Appropriate   Participation Level: Minimal    Clinical Observations/Individualized Feedback: Victory was originally present in group. Pt sat with her head down briefly and fell asleep minutes later and began snoring. Pt woke up and shared that she was feeling dizzy. MHT Garrel was notified and assisted pt out of the craft room.   Plan: Continue to engage patient in RT group sessions 2-3x/week.   Jeoffrey BRAVO Celestia, LRT, CTRS 10/16/2024 11:47 AM

## 2024-10-16 NOTE — Group Note (Signed)
 Date:  10/16/2024 Time:  10:03 AM  Group Topic/Focus:  Activity Group: The focus of the group is to encourage the patients to go outside to the courtyard and get some fresh air and some exercise.    Participation Level:  Active  Participation Quality:  Appropriate  Affect:  Appropriate  Cognitive:  Appropriate  Insight: Appropriate  Engagement in Group:  Engaged  Modes of Intervention:  Activity  Additional Comments:    Camellia HERO Tolbert Matheson 10/16/2024, 10:03 AM

## 2024-10-16 NOTE — Group Note (Signed)
 Date:  10/16/2024 Time:  10:11 AM  Group Topic/Focus:  Goals Group:   The focus of this group is to help patients establish daily goals to achieve during treatment and discuss how the patient can incorporate goal setting into their daily lives to aide in recovery.    Participation Level:  Active  Participation Quality:  Appropriate  Affect:  Appropriate  Cognitive:  Appropriate  Insight: Appropriate  Engagement in Group:  Engaged  Modes of Intervention:  Activity  Additional Comments:    Faith [name] 10/16/2024, 10:11 AM

## 2024-10-16 NOTE — Progress Notes (Signed)
" °   10/16/24 1051  Vital Signs  Temp 98.1 F (36.7 C)  Temp Source Oral  Pulse Rate 90  Pulse Rate Source Monitor  Resp 18  BP (!) 105/58  BP Location Left Arm  BP Method Automatic  Patient Position (if appropriate) Sitting  Oxygen  Therapy  SpO2 100 %  O2 Device Room Air   This clinical research associate was informed by Garrel, MHT, that patient passed out in group. Vitals were taken and patient was given some Gatorade to increase her fluid intake. Patient asked to sit in the chair, outside of the nurses station, until she was ready to go back to her room. This writer walked with patient back to the room "

## 2024-10-16 NOTE — Plan of Care (Signed)
   Problem: Activity: Goal: Sleeping patterns will improve Outcome: Progressing   Problem: Safety: Goal: Periods of time without injury will increase Outcome: Progressing

## 2024-10-16 NOTE — Group Note (Signed)
"                                                 [facility] LCSW Group Therapy Note    Group Date: 10/16/2024 Start Time: 1300 End Time: 1350  Type of Therapy and Topic:  Group Therapy:  Overcoming Obstacles  Participation Level:  [facility] PARTICIPATION LEVEL: Did Not Attend  Description of Group:   In this group patients will be encouraged to explore what they see as obstacles to their own wellness and recovery. They will be guided to discuss their thoughts, feelings, and behaviors related to these obstacles. The group will process together ways to cope with barriers, with attention given to specific choices patients can make. Each patient will be challenged to identify changes they are motivated to make in order to overcome their obstacles. This group will be process-oriented, with patients participating in exploration of their own experiences as well as giving and receiving support and challenge from other group members.  Therapeutic Goals: 1. Patient will identify personal and current obstacles as they relate to admission. 2. Patient will identify barriers that currently interfere with their wellness or overcoming obstacles.  3. Patient will identify feelings, thought process and behaviors related to these barriers. 4. Patient will identify two changes they are willing to make to overcome these obstacles:    Summary of Patient Progress Patient did not attend group.   Therapeutic Modalities:   Cognitive Behavioral Therapy Solution Focused Therapy Motivational Interviewing Relapse Prevention Therapy   Nadara JONELLE Fam, LCSW "

## 2024-10-16 NOTE — Progress Notes (Signed)
 " [facility] MD Progress Note  10/16/2024 10:38 PM TAJIA SZELIGA  MRN:  [id number]   Subjective:  Chart reviewed, case discussed in multidisciplinary meeting, patient seen during rounds.   10/16/24: Patient pleasant and cooperative upon contact, but tired and does not engage much. She denies concerns. Denies SI, HI, and AVH. She feels the quetiapine  has been helpful. She remains discharge focused. Concern for potential CPS report (social work to contact, need for collateral information, and linkage to appropriate services indicated.   10/15/2024: Patient was seen this morning in her room for psychiatric reassessment during rounds.Patient is alert and oriented X 4, calm, cooperative, and engaged well in the interview. Patient reports she feels that the Quetiapine  helped her sleep well and she can tell a notable improvement in her anxiety today. However, she remains discharge focused and continues to request to be discharged. She reports that she desires to be discharged to her sister's home and follow up with [facility] outpatient. She was encouraged to remain on the inpatient unit and continue in the milieu and to follow up with the behavioral team regarding a solidified discharge plan with appropriate resources due to being pregnant. She endorses having ongoing depression and anxiety symptoms, but does report that her mood is improving. Patient admits to having nicotine  cravings, denies substance (cocaine) withdrawals, and appears more euthymic today. Patient states she is eating and sleeping well, about 8 to 9 hours at night. Patient denies current suicidal or homicidal ideations, or perceptual disturbances. Will continue Quetiapine  100 mg at bedtime for depression/mood, continue to monitor patient for efficacy and adverse reactions to the medication.  10/14/2024: Patient was seen this morning in her room for psychiatric reassessment during rounds.Patient is alert and oriented X 4, calm, cooperative, and engaged well  in the interview. Patient reports she is here for substance detox, but she wants to be discharged. She endorses having severe depression and anxiety symptoms, and states being on the inpatient unit makes her feel worse. Patient admits to having cravings, denies withdrawals, but appears irritable as she continues with the interview. She reports she is not opposed to receiving medication for depression. Patient states she is eating and sleeping well, about 7 to 8 hours at night. Patient denies current suicidal or homicidal ideations, or perceptual disturbances. Will begin Quetiapine  100 mg at bedtime for depression/mood, continue to monitor patient for efficacy and adverse reactions to the medication.  Patient is a 34 year old female who currently is homeless reportedly was living with a boyfriend of 4 to 5 years she reports that she also has challenges with cocaine abuse and other drug abuse and has not used recently but she reports that she was feeling overwhelmed and suicidal because of which she came to the emergency room continues to endorse suicidal thoughts Patient is admitted to adult psych unit with Q15 min safety monitoring. Multidisciplinary team approach is offered. Medication management; group/milieu therapy is offered.    Patient reports having argument with the boyfriend and has been homeless.  She did acknowledge doing cocaine and smoking cigarettes throughout the pregnancy.  She reports being pregnant for 22 weeks.  Reports this pregnancy being her fourth child.  She is unable to identify any support in the community.  She is minimizing her symptoms of depression and reports that she is feeling better now.  She denies feeling hopeless or worthless.  She reports having fair appetite and sleep.  She denies anxiety and reports intermittent panic attacks.  She denies auditory/visual hallucinations.  She reports use of cocaine intermittently.  Patient denies SI/HI/plan.  Patient denies nightmares or  flashbacks.  Past Psychiatric History: see h&P Family History:  Family History  Problem Relation Age of Onset   Heart disease Father    Huntington's disease Mother    Social History:  Social History   Substance and Sexual Activity  Alcohol Use Not Currently   Alcohol/week: 0.0 standard drinks of alcohol     Social History   Substance and Sexual Activity  Drug Use Not Currently   Types: Cocaine, Marijuana    Social History   Socioeconomic History   Marital status: Single    Spouse name: Not on file   Number of children: 2   Years of education: Not on file   Highest education level: Not on file  Occupational History   Not on file  Tobacco Use   Smoking status: Every Day    Current packs/day: 0.50    Average packs/day: 0.5 packs/day for 7.0 years (3.5 ttl pk-yrs)    Types: Cigarettes   Smokeless tobacco: Never  Vaping Use   Vaping status: Never Used  Substance and Sexual Activity   Alcohol use: Not Currently    Alcohol/week: 0.0 standard drinks of alcohol   Drug use: Not Currently    Types: Cocaine, Marijuana   Sexual activity: Yes    Birth control/protection: None  Other Topics Concern   Not on file  Social History Narrative   Not on file   Social Drivers of Health   Tobacco Use: High Risk (10/12/2024)   Patient History    Smoking Tobacco Use: Every Day    Smokeless Tobacco Use: Never    Passive Exposure: Not on file  Financial Resource Strain: High Risk (08/26/2024)   Received from [facility]   Overall Financial Resource Strain (CARDIA)    How hard is it for you to pay for the very basics like food, housing, medical care, and heating?: Very hard  Food Insecurity: No Food Insecurity (10/12/2024)   Epic    Worried About Programme Researcher, Broadcasting/film/video in the Last Year: Never true    Ran Out of Food in the Last Year: Never true  Transportation Needs: Patient Declined (10/12/2024)   Epic    Lack of Transportation (Medical): Patient declined    Lack of  Transportation (Non-Medical): Patient declined  Physical Activity: Insufficiently Active (08/26/2024)   Received from [facility]   Exercise Vital Sign    On average, how many days per week do you engage in moderate to strenuous exercise (like a brisk walk)?: 2 days    On average, how many minutes do you engage in exercise at this level?: 10 min  Stress: Stress Concern Present (08/26/2024)   Received from [facility] of Occupational Health - Occupational Stress Questionnaire    Do you feel stress - tense, restless, nervous, or anxious, or unable to sleep at night because your mind is troubled all the time - these days?: Very much  Social Connections: Moderately Isolated (08/26/2024)   Received from [facility]   Social Connection and Isolation Panel    In a typical week, how many times do you talk on the phone with family, friends, or neighbors?: Twice a week    How often do you get together with friends or relatives?: Twice a week    How often do you attend church or religious services?: Never    Do you belong  to any clubs or organizations such as church groups, unions, fraternal or athletic groups, or school groups?: No    How often do you attend meetings of the clubs or organizations you belong to?: 1 to 4 times per year    Are you married, widowed, divorced, separated, never married, or living with a partner?: Never married  Depression (PHQ2-9): Not on file  Alcohol Screen: Low Risk (10/12/2024)   Alcohol Screen    Last Alcohol Screening Score (AUDIT): 0  Housing: Patient Declined (10/12/2024)   Epic    Unable to Pay for Housing in the Last Year: Patient declined    Number of Times Moved in the Last Year: Not on file    Homeless in the Last Year: Patient declined  Utilities: Not At Risk (10/12/2024)   Epic    Threatened with loss of utilities: No  Health Literacy: Low Risk (08/26/2024)   Received from [facility]    How often do  you need to have someone help you when you read instructions, pamphlets, or other written material from your doctor or pharmacy?: Never   Past Medical History:  Past Medical History:  Diagnosis Date   Acute adjustment disorder with depressed mood 06/18/2023   ADHD    Anemia    BLOOD TRANSFUSION AFTER C-SECTION ON 06-2016-2 UNITS   Anxiety    Asthma    WELL CONTROLLED   B12 deficiency 06/24/2022   Bipolar 1 disorder, depressed (HCC)    COVID-19    Depression    Diabetes mellitus affecting pregnancy, unspecified trimester 09/19/2020   Diabetes mellitus without complication (HCC)    Gallstones    Gastroesophageal reflux disease 08/21/2010   GERD (gastroesophageal reflux disease)    Heart murmur    History of blood transfusion 06/2016   RECEIVED 2 UNITS OF BLOOD AFTER C SECTION   History of maternal blood transfusion, currently pregnant 04/19/2017   Malingering 05/03/2024   Menometrorrhagia 05/13/2022   Migraines    Ovarian cyst    Seizure disorder during pregnancy, third trimester (HCC) 05/10/2017   Seizures (HCC)    LAST SEIZURE January 2022   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   Smoking (tobacco) complicating pregnancy, unspecified trimester 05/10/2017   Substance induced mood disorder (HCC) 06/18/2023   Suicidal ideation 06/15/2013   Tubal pregnancy     Past Surgical History:  Procedure Laterality Date   CESAREAN SECTION N/A 06/22/2016   Procedure: CESAREAN SECTION;  Surgeon: Glory High, MD;  Location: [facility] ORS;  Service: Obstetrics;  Laterality: N/A;   CESAREAN SECTION N/A 10/12/2017   Procedure: CESAREAN SECTION;  Surgeon: High Glory, MD;  Location: [facility] ORS;  Service: Obstetrics;  Laterality: N/A;   CESAREAN SECTION N/A 03/27/2021   Procedure: CESAREAN SECTION;  Surgeon: Victor Claudell SAUNDERS, MD;  Location: [facility] ORS;  Service: Obstetrics;  Laterality: N/A;   CHOLECYSTECTOMY N/A 08/13/2016   Procedure: LAPAROSCOPIC CHOLECYSTECTOMY;  Surgeon: Laneta JULIANNA Luna, MD;  Location: [facility] ORS;  Service: General;  Laterality: N/A;   DILATION AND CURETTAGE OF UTERUS     ovarian cyst removed     SALPINGECTOMY Right 2013   ectopic pregnancy. PER PATIENT, STILL HAS BOTH TUBES   TONSILLECTOMY     TONSILLECTOMY AND ADENOIDECTOMY      Current Medications: Current Facility-Administered Medications  Medication Dose Route Frequency Provider Last Rate Last Admin   acetaminophen  (TYLENOL ) tablet 650 mg  650 mg Oral Q6H PRN Madaram, Kondal R, MD  650 mg at 10/14/24 2119   alum & mag hydroxide-simeth (MAALOX/MYLANTA) 200-200-20 MG/5ML suspension 30 mL  30 mL Oral Q4H PRN Madaram, Kondal R, MD       haloperidol  lactate (HALDOL ) injection 5 mg  5 mg Intramuscular TID PRN Madaram, Kondal R, MD       And   diphenhydrAMINE  (BENADRYL ) injection 50 mg  50 mg Intramuscular TID PRN Madaram, Kondal R, MD       And   LORazepam  (ATIVAN ) injection 2 mg  2 mg Intramuscular TID PRN Madaram, Kondal R, MD       ferrous sulfate  tablet 325 mg  325 mg Oral QODAY Madaram, Kondal R, MD   325 mg at 10/15/24 2110   magnesium  hydroxide (MILK OF MAGNESIA) suspension 30 mL  30 mL Oral Daily PRN Madaram, Kondal R, MD       metFORMIN  (GLUCOPHAGE -XR) 24 hr tablet 500 mg  500 mg Oral Q breakfast Charma Domino, CNM   500 mg at 10/16/24 0947   prenatal vitamin w/FE, FA (NATACHEW) chewable tablet 1 tablet  1 tablet Oral Q1200 Charma Domino, CNM   1 tablet at 10/16/24 1300   QUEtiapine  (SEROQUEL ) tablet 100 mg  100 mg Oral QHS Montague, Crystal J, NP   100 mg at 10/16/24 2122    Lab Results:  Results for orders placed or performed during the hospital encounter of 10/12/24 (from the past 48 hours)  Hemoglobin A1c     Status: None   Collection Time: 10/15/24  2:30 PM  Result Value Ref Range   Hgb A1c MFr Bld 5.3 4.8 - 5.6 %    Comment: (NOTE) Diagnosis of Diabetes The following HbA1c ranges recommended by the American Diabetes Association (ADA) Jaelan Rasheed be used as an aid in the diagnosis of  diabetes mellitus.  Hemoglobin             Suggested A1C NGSP%              Diagnosis  <5.7                   Non Diabetic  5.7-6.4                Pre-Diabetic  >6.4                   Diabetic  <7.0                   Glycemic control for                       adults with diabetes.     Mean Plasma Glucose 105.41 mg/dL    Comment: Performed at [facility], [address], [place], [place] [address]  Lipid panel     Status: Abnormal   Collection Time: 10/15/24  2:30 PM  Result Value Ref Range   Cholesterol 174 0 - 200 mg/dL    Comment:        ATP III CLASSIFICATION:  <200     mg/dL   Desirable  799-760  mg/dL   Borderline High  >=759    mg/dL   High           Triglycerides 204 (H) <150 mg/dL   HDL 50 >59 mg/dL   Total CHOL/HDL Ratio 3.5 RATIO   VLDL 41 (H) 0 - 40 mg/dL   LDL Cholesterol 83 0 - 99 mg/dL    Comment:  Total Cholesterol/HDL:CHD Risk Coronary Heart Disease Risk Table                     Men   Women  1/2 Average Risk   3.4   3.3  Average Risk       5.0   4.4  2 X Average Risk   9.6   7.1  3 X Average Risk  23.4   11.0        Use the calculated Patient Ratio above and the CHD Risk Table to determine the patient's CHD Risk.        ATP III CLASSIFICATION (LDL):  <100     mg/dL   Optimal  899-870  mg/dL   Near or Above                    Optimal  130-159  mg/dL   Borderline  839-810  mg/dL   High  >809     mg/dL   Very High Performed at [facility], [address] [place], [place] [address]   Glucose, capillary     Status: Abnormal   Collection Time: 10/16/24  2:15 PM  Result Value Ref Range   Glucose-Capillary 116 (H) 70 - 99 mg/dL    Comment: Glucose reference range applies only to samples taken after fasting for at least 8 hours.  Glucose, capillary     Status: None   Collection Time: 10/16/24  7:22 PM  Result Value Ref Range   Glucose-Capillary 98 70 - 99 mg/dL    Comment: Glucose reference range applies only to samples  taken after fasting for at least 8 hours.     Blood Alcohol level:  Lab Results  Component Value Date   [facility] <15 10/12/2024   ETH <15 05/03/2024    Metabolic Disorder Labs: Lab Results  Component Value Date   HGBA1C 5.3 10/15/2024   MPG 105.41 10/15/2024   MPG 108 03/27/2021   Lab Results  Component Value Date   PROLACTIN 8.4 05/17/2019   Lab Results  Component Value Date   CHOL 174 10/15/2024   TRIG 204 (H) 10/15/2024   HDL 50 10/15/2024   CHOLHDL 3.5 10/15/2024   VLDL 41 (H) 10/15/2024   LDLCALC 83 10/15/2024    Physical Findings: AIMS:  , ,  ,  ,    CIWA:    COWS:      Psychiatric Specialty Exam:  Presentation  General Appearance:  Appropriate for Environment  Eye Contact: Fair  Speech: Clear and Coherent; Normal Rate  Speech Volume: Normal    Mood and Affect  Mood: Euthymic  Affect: Congruent   Thought Process  Thought Processes: Coherent  Orientation:Full (Time, Place and Person)  Thought Content:Illogical  Hallucinations:Hallucinations: None   Ideas of Reference:None  Suicidal Thoughts:Suicidal Thoughts: No   Homicidal Thoughts:Homicidal Thoughts: No    Sensorium  Memory: Immediate Fair  Judgment: Poor  Insight: Fair   Chartered Certified Accountant: Fair  Attention Span: Fair  Recall: [employer] of Knowledge: Fair  Language: Fair   Psychomotor Activity  Psychomotor Activity: Psychomotor Activity: Normal   Musculoskeletal: Strength & Muscle Tone: within normal limits Gait & Station: normal Assets  Assets: Manufacturing Systems Engineer; Desire for Improvement; Resilience    Physical Exam: Physical Exam Vitals and nursing note reviewed.  Constitutional:      Appearance: Normal appearance.  Pulmonary:     Effort: Pulmonary effort is normal.  Neurological:  Mental Status: She is alert and oriented to person, place, and time.    Review of Systems  Respiratory:  Negative for shortness  of breath.   Cardiovascular:  Negative for chest pain.  Gastrointestinal:  Negative for diarrhea, nausea and vomiting.  Psychiatric/Behavioral:  Positive for substance abuse. Negative for hallucinations and suicidal ideas.   All other systems reviewed and are negative.  Blood pressure (!) 105/58, pulse 90, temperature 98.1 F (36.7 C), temperature source Oral, resp. rate 18, height 5' (1.524 m), weight 108.9 kg, SpO2 100%. Body mass index is 46.87 kg/m.  Diagnosis: Principal Problem:   Adjustment disorder with emotional disturbance   PLAN: Safety and Monitoring:  -- Voluntary admission to inpatient psychiatric unit for safety, stabilization and treatment  -- Daily contact with patient to assess and evaluate symptoms and progress in treatment  -- Patient's case to be discussed in multi-disciplinary team meeting  -- Observation Level : q15 minute checks  -- Vital signs:  q12 hours  -- Precautions: suicide, elopement, and assault -- Encouraged patient to participate in unit milieu and in scheduled group therapies  2. Psychiatric Treatment:  Scheduled Medications:  -Continue Quetiapine  100 mg QHS for depression/mood.   -- The risks/benefits/side-effects/alternatives to this medication were discussed in detail with the patient and time was given for questions. The patient consents to medication trial.  3. Medical Issues Being Addressed:   -Patient is at [redacted] weeks gestation. OBGYN consulted and following  4. Discharge Planning:   -- Social work and case management to assist with discharge planning and identification of hospital follow-up needs prior to discharge  -- Estimated LOS: 5-7 days   -- Discharge Concerns: Need to establish a safety plan; Medication compliance and effectiveness.             -- Discharge Goals: Return home with outpatient referrals follow ups  Geraldo Haris, NP 10/16/2024, 10:38 PM  "

## 2024-10-17 DIAGNOSIS — F4329 Adjustment disorder with other symptoms: Secondary | ICD-10-CM | POA: Diagnosis not present

## 2024-10-17 LAB — GLUCOSE, CAPILLARY
Glucose-Capillary: 98 mg/dL (ref 70–99)
Glucose-Capillary: 104 mg/dL — ABNORMAL HIGH (ref 70–99)
Glucose-Capillary: 120 mg/dL — ABNORMAL HIGH (ref 70–99)
Glucose-Capillary: 111 mg/dL — ABNORMAL HIGH (ref 70–99)

## 2024-10-17 NOTE — Group Note (Signed)
 Date:  10/17/2024 Time:  9:01 PM  Group Topic/Focus:  Making Healthy Choices:   The focus of this group is to help patients identify negative/unhealthy choices they were using prior to admission and identify positive/healthier coping strategies to replace them upon discharge. Self Care:   The focus of this group is to help patients understand the importance of self-care in order to improve or restore emotional, physical, spiritual, interpersonal, and financial health. Self Esteem Action Plan:   The focus of this group is to help patients create a plan to continue to build self-esteem after discharge. Wrap-Up Group:   The focus of this group is to help patients review their daily goal of treatment and discuss progress on daily workbooks.    Participation Level:  Active  Participation Quality:  Appropriate and Attentive  Affect:  Appropriate  Cognitive:  Alert, Appropriate, and Oriented  Insight: Appropriate and Good  Engagement in Group:  Engaged  Modes of Intervention:  Discussion and Support  Additional Comments:  N/A  Faith [name] 10/17/2024, 9:01 PM

## 2024-10-17 NOTE — Plan of Care (Signed)

## 2024-10-17 NOTE — Plan of Care (Signed)
  Problem: Education: Goal: Mental status will improve Outcome: Progressing   Problem: Activity: Goal: Interest or engagement in activities will improve Outcome: Progressing Goal: Sleeping patterns will improve Outcome: Progressing   Problem: Coping: Goal: Ability to verbalize frustrations and anger appropriately will improve Outcome: Progressing Goal: Ability to demonstrate self-control will improve Outcome: Progressing   Problem: Safety: Goal: Periods of time without injury will increase Outcome: Progressing

## 2024-10-17 NOTE — Discharge Summary (Signed)
 " Physician Discharge Summary Note  Patient:  Faith [name] is an 34 y.o., female MRN:  [id number] DOB:  02-Jan-1991 Patient phone:  [phone] (home)  Patient address:   [address] [facility] [place] [address],   Total time spent: 40 min Date of Admission:  10/12/2024 Date of Discharge: 10/18/2024  Reason for Admission:  Suicidal ideation due to being overwhelmed, concerns with substance abuse, argument with boyfriend, and homelessness  Principal Problem: Adjustment disorder with emotional disturbance Discharge Diagnoses: Principal Problem:   Adjustment disorder with emotional disturbance   Past Psychiatric History: see h&p  Family Psychiatric  History: see h&p Social History:  Social History   Substance and Sexual Activity  Alcohol Use Not Currently   Alcohol/week: 0.0 standard drinks of alcohol     Social History   Substance and Sexual Activity  Drug Use Not Currently   Types: Cocaine, Marijuana    Social History   Socioeconomic History   Marital status: Single    Spouse name: Not on file   Number of children: 2   Years of education: Not on file   Highest education level: Not on file  Occupational History   Not on file  Tobacco Use   Smoking status: Every Day    Current packs/day: 0.50    Average packs/day: 0.5 packs/day for 7.0 years (3.5 ttl pk-yrs)    Types: Cigarettes   Smokeless tobacco: Never  Vaping Use   Vaping status: Never Used  Substance and Sexual Activity   Alcohol use: Not Currently    Alcohol/week: 0.0 standard drinks of alcohol   Drug use: Not Currently    Types: Cocaine, Marijuana   Sexual activity: Yes    Birth control/protection: None  Other Topics Concern   Not on file  Social History Narrative   Not on file   Social Drivers of Health   Tobacco Use: High Risk (10/12/2024)   Patient History    Smoking Tobacco Use: Every Day    Smokeless Tobacco Use: Never    Passive Exposure: Not on file  Financial Resource Strain: High Risk  (08/26/2024)   Received from [facility]   Overall Financial Resource Strain (CARDIA)    How hard is it for you to pay for the very basics like food, housing, medical care, and heating?: Very hard  Food Insecurity: No Food Insecurity (10/12/2024)   Epic    Worried About Programme Researcher, Broadcasting/film/video in the Last Year: Never true    Ran Out of Food in the Last Year: Never true  Transportation Needs: Patient Declined (10/12/2024)   Epic    Lack of Transportation (Medical): Patient declined    Lack of Transportation (Non-Medical): Patient declined  Physical Activity: Insufficiently Active (08/26/2024)   Received from [facility]   Exercise Vital Sign    On average, how many days per week do you engage in moderate to strenuous exercise (like a brisk walk)?: 2 days    On average, how many minutes do you engage in exercise at this level?: 10 min  Stress: Stress Concern Present (08/26/2024)   Received from [facility] - Occupational Stress Questionnaire    Do you feel stress - tense, restless, nervous, or anxious, or unable to sleep at night because your mind is troubled all the time - these days?: Very much  Social Connections: Moderately Isolated (08/26/2024)   Received from [facility]   Social Connection and Isolation  Panel    In a typical week, how many times do you talk on the phone with family, friends, or neighbors?: Twice a week    How often do you get together with friends or relatives?: Twice a week    How often do you attend church or religious services?: Never    Do you belong to any clubs or organizations such as church groups, unions, fraternal or athletic groups, or school groups?: No    How often do you attend meetings of the clubs or organizations you belong to?: 1 to 4 times per year    Are you married, widowed, divorced, separated, never married, or living with a partner?: Never married  Depression (PHQ2-9): Not on file  Alcohol  Screen: Low Risk (10/12/2024)   Alcohol Screen    Last Alcohol Screening Score (AUDIT): 0  Housing: Patient Declined (10/12/2024)   Epic    Unable to Pay for Housing in the Last Year: Patient declined    Number of Times Moved in the Last Year: Not on file    Homeless in the Last Year: Patient declined  Utilities: Not At Risk (10/12/2024)   Epic    Threatened with loss of utilities: No  Health Literacy: Low Risk (08/26/2024)   Received from [facility]    How often do you need to have someone help you when you read instructions, pamphlets, or other written material from your doctor or pharmacy?: Never   Past Medical History:  Past Medical History:  Diagnosis Date   Acute adjustment disorder with depressed mood 06/18/2023   ADHD    Anemia    BLOOD TRANSFUSION AFTER C-SECTION ON 06-2016-2 UNITS   Anxiety    Asthma    WELL CONTROLLED   B12 deficiency 06/24/2022   Bipolar 1 disorder, depressed (HCC)    COVID-19    Depression    Diabetes mellitus affecting pregnancy, unspecified trimester 09/19/2020   Diabetes mellitus without complication (HCC)    Gallstones    Gastroesophageal reflux disease 08/21/2010   GERD (gastroesophageal reflux disease)    Heart murmur    History of blood transfusion 06/2016   RECEIVED 2 UNITS OF BLOOD AFTER C SECTION   History of maternal blood transfusion, currently pregnant 04/19/2017   Malingering 05/03/2024   Menometrorrhagia 05/13/2022   Migraines    Ovarian cyst    Seizure disorder during pregnancy, third trimester (HCC) 05/10/2017   Seizures (HCC)    LAST SEIZURE January 2022   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   Smoking (tobacco) complicating pregnancy, unspecified trimester 05/10/2017   Substance induced mood disorder (HCC) 06/18/2023   Suicidal ideation 06/15/2013   Tubal pregnancy     Past Surgical History:  Procedure Laterality Date   CESAREAN SECTION N/A 06/22/2016   Procedure: CESAREAN SECTION;   Surgeon: Glory High, MD;  Location: [facility] ORS;  Service: Obstetrics;  Laterality: N/A;   CESAREAN SECTION N/A 10/12/2017   Procedure: CESAREAN SECTION;  Surgeon: High Glory, MD;  Location: [facility] ORS;  Service: Obstetrics;  Laterality: N/A;   CESAREAN SECTION N/A 03/27/2021   Procedure: CESAREAN SECTION;  Surgeon: Victor Claudell SAUNDERS, MD;  Location: [facility] ORS;  Service: Obstetrics;  Laterality: N/A;   CHOLECYSTECTOMY N/A 08/13/2016   Procedure: LAPAROSCOPIC CHOLECYSTECTOMY;  Surgeon: Laneta JULIANNA Luna, MD;  Location: [facility] ORS;  Service: General;  Laterality: N/A;   DILATION AND CURETTAGE OF UTERUS     ovarian cyst removed     SALPINGECTOMY Right  2013   ectopic pregnancy. PER PATIENT, STILL HAS BOTH TUBES   TONSILLECTOMY     TONSILLECTOMY AND ADENOIDECTOMY     Family History:  Family History  Problem Relation Age of Onset   Heart disease Father    Huntington's disease Mother     Hospital Course:    Patient is a 34 year old female with a pphx of depression who presented to the ED expressing SI in the context of a fight with boyfriend, homelessness, and substance use concerns (UDS +cocaine). Patient pregnant during presentation.   On admission, patient minimized her depression symptoms and expressed feeling better. She expressed using substances and smoking cigarettes throughout her pregnancy (reporting being about 22 weeks). Patient was initially hesitant to start medications due to pregnancy. Eventually, patient agreeable to starting Seroquel  nightly and was maintained on 100 mg nightly. Patient remained in behavioral control while on the unit, attended groups, and was medication compliant. She was followed by Obstetrics while on the unit without concern.   Detailed risk assessment is complete based on clinical exam and individual risk factors and acute suicide risk is low and acute violence risk is low.    On the day of discharge, patient denies SI/HI/plan and denies hallucinations.   Patient remains future oriented and is willing to participate in outpatient mental health services.  Currently, all modifiable risk of harm to self/harm to others have been addressed and patient is no longer appropriate for the acute inpatient setting and is able to continue treatment for mental health needs in the community with the supports as indicated below.  Patient is educated and verbalized understanding of discharge plan of care including medications, follow-up appointments, mental health resources and further crisis services in the community.  She is instructed to call 911 or present to the nearest emergency room should he experience any decompensation in mood, disturbance of bowel or return of suicidal/homicidal ideations.  Patient verbalizes understanding of this education and agrees to this plan of care  Physical Findings: AIMS:  , ,  ,  ,    CIWA:    COWS:      Psychiatric Specialty Exam:  Presentation  General Appearance:  Appropriate for Environment; Casual  Eye Contact: Good  Speech: Clear and Coherent; Normal Rate  Speech Volume: Normal    Mood and Affect  Mood: Euthymic  Affect: Congruent   Thought Process  Thought Processes: Coherent; Goal Directed; Linear  Descriptions of Associations:Intact  Orientation:Full (Time, Place and Person)  Thought Content:Logical  Hallucinations:Hallucinations: None  Ideas of Reference:None  Suicidal Thoughts:Suicidal Thoughts: No  Homicidal Thoughts:Homicidal Thoughts: No   Sensorium  Memory: Immediate Fair; Recent Fair  Judgment: Poor  Insight: Fair   Chartered Certified Accountant: Fair  Attention Span: Fair  Recall: [employer] of Knowledge: Fair  Language: Fair   Psychomotor Activity  Psychomotor Activity: Psychomotor Activity: Normal  Musculoskeletal: Strength & Muscle Tone: within normal limits Gait & Station: normal Assets  Assets: Manufacturing Systems Engineer; Desire for  Improvement; Resilience   Sleep  Sleep: Sleep: Fair    Physical Exam: Physical Exam Vitals and nursing note reviewed.  Constitutional:      Appearance: Normal appearance.  Pulmonary:     Effort: Pulmonary effort is normal.  Neurological:     Mental Status: She is alert and oriented to person, place, and time.  Psychiatric:        Mood and Affect: Mood normal.        Behavior: Behavior normal.  Thought Content: Thought content normal.    Review of Systems  Respiratory:  Negative for shortness of breath.   Cardiovascular:  Negative for chest pain.  Gastrointestinal:  Negative for diarrhea, nausea and vomiting.  Psychiatric/Behavioral:  Positive for substance abuse. Negative for depression, hallucinations and suicidal ideas. The patient is not nervous/anxious.   All other systems reviewed and are negative.  Blood pressure (!) 121/58, pulse 81, temperature 98.2 F (36.8 C), temperature source Oral, resp. rate 18, height 5' (1.524 m), weight 108.9 kg, SpO2 99%. Body mass index is 46.87 kg/m.   Tobacco Use History[1] Tobacco Cessation:  A prescription for an FDA-approved tobacco cessation medication was offered at discharge and the patient refused   Blood Alcohol level:  Lab Results  Component Value Date   [facility] <15 10/12/2024   [facility] <15 05/03/2024    Metabolic Disorder Labs:  Lab Results  Component Value Date   HGBA1C 5.3 10/15/2024   MPG 105.41 10/15/2024   MPG 108 03/27/2021   Lab Results  Component Value Date   PROLACTIN 8.4 05/17/2019   Lab Results  Component Value Date   CHOL 174 10/15/2024   TRIG 204 (H) 10/15/2024   HDL 50 10/15/2024   CHOLHDL 3.5 10/15/2024   VLDL 41 (H) 10/15/2024   LDLCALC 83 10/15/2024    See Psychiatric Specialty Exam and Suicide Risk Assessment completed by Attending Physician prior to discharge.  Discharge destination:  Home  Is patient on multiple antipsychotic therapies at discharge:  No   Has Patient had three or  more failed trials of antipsychotic monotherapy by history:  No  Recommended Plan for Multiple Antipsychotic Therapies: NA     Follow-up Information     [facility] Follow up.   Contact information: [address] [place] [address] [phone]                 Follow-up recommendations:   Follow up with outpatient providers listed above.     Signed: Kaire Stary, NP 10/17/2024, 10:32 PM            [1]  Social History Tobacco Use  Smoking Status Every Day   Current packs/day: 0.50   Average packs/day: 0.5 packs/day for 7.0 years (3.5 ttl pk-yrs)   Types: Cigarettes  Smokeless Tobacco Never   "

## 2024-10-17 NOTE — Progress Notes (Signed)
 " [facility] MD Progress Note  10/17/2024 1:02 PM Faith [name]  MRN:  [id number]   Subjective:  Chart reviewed, case discussed in multidisciplinary meeting, patient seen during rounds.   10/17/24: Writer spoke with patient today who reports that she is feeling better. She reports that she feels the seroquel  is helpful. She was taken off all of her medications by a previous provider who she said retired due to their potential impact on her pregnancy. Discussed medications, safety, and risk vs.benefit of untreated mental health disorders or medications in length with patient. Patient given MotherToBaby fact sheets about current medication for review after discussion as well. She denies SI, HI, and AVH. She is discharge focused at this time. She reports intent to follow up outpatient with mental health and also gives permission for social work/provider to contact her sister who is identified as a good support for her.   10/16/24: Patient pleasant and cooperative upon contact, but tired and does not engage much. She denies concerns. Denies SI, HI, and AVH. She feels the quetiapine  has been helpful. She remains discharge focused. Concern for potential CPS report (social work to contact, need for collateral information, and linkage to appropriate services indicated.   10/15/2024: Patient was seen this morning in her room for psychiatric reassessment during rounds.Patient is alert and oriented X 4, calm, cooperative, and engaged well in the interview. Patient reports she feels that the Quetiapine  helped her sleep well and she can tell a notable improvement in her anxiety today. However, she remains discharge focused and continues to request to be discharged. She reports that she desires to be discharged to her sister's home and follow up with [facility] outpatient. She was encouraged to remain on the inpatient unit and continue in the milieu and to follow up with the behavioral team regarding a solidified discharge plan with  appropriate resources due to being pregnant. She endorses having ongoing depression and anxiety symptoms, but does report that her mood is improving. Patient admits to having nicotine  cravings, denies substance (cocaine) withdrawals, and appears more euthymic today. Patient states she is eating and sleeping well, about 8 to 9 hours at night. Patient denies current suicidal or homicidal ideations, or perceptual disturbances. Will continue Quetiapine  100 mg at bedtime for depression/mood, continue to monitor patient for efficacy and adverse reactions to the medication.  10/14/2024: Patient was seen this morning in her room for psychiatric reassessment during rounds.Patient is alert and oriented X 4, calm, cooperative, and engaged well in the interview. Patient reports she is here for substance detox, but she wants to be discharged. She endorses having severe depression and anxiety symptoms, and states being on the inpatient unit makes her feel worse. Patient admits to having cravings, denies withdrawals, but appears irritable as she continues with the interview. She reports she is not opposed to receiving medication for depression. Patient states she is eating and sleeping well, about 7 to 8 hours at night. Patient denies current suicidal or homicidal ideations, or perceptual disturbances. Will begin Quetiapine  100 mg at bedtime for depression/mood, continue to monitor patient for efficacy and adverse reactions to the medication.  Patient is a 34 year old female who currently is homeless reportedly was living with a boyfriend of 4 to 5 years she reports that she also has challenges with cocaine abuse and other drug abuse and has not used recently but she reports that she was feeling overwhelmed and suicidal because of which she came to the emergency room continues to endorse suicidal thoughts  Patient is admitted to adult psych unit with Q15 min safety monitoring. Multidisciplinary team approach is offered.  Medication management; group/milieu therapy is offered.    Patient reports having argument with the boyfriend and has been homeless.  She did acknowledge doing cocaine and smoking cigarettes throughout the pregnancy.  She reports being pregnant for 22 weeks.  Reports this pregnancy being her fourth child.  She is unable to identify any support in the community.  She is minimizing her symptoms of depression and reports that she is feeling better now.  She denies feeling hopeless or worthless.  She reports having fair appetite and sleep.  She denies anxiety and reports intermittent panic attacks.  She denies auditory/visual hallucinations.  She reports use of cocaine intermittently.  Patient denies SI/HI/plan.  Patient denies nightmares or flashbacks.  Past Psychiatric History: see h&P Family History:  Family History  Problem Relation Age of Onset   Heart disease Father    Huntington's disease Mother    Social History:  Social History   Substance and Sexual Activity  Alcohol Use Not Currently   Alcohol/week: 0.0 standard drinks of alcohol     Social History   Substance and Sexual Activity  Drug Use Not Currently   Types: Cocaine, Marijuana    Social History   Socioeconomic History   Marital status: Single    Spouse name: Not on file   Number of children: 2   Years of education: Not on file   Highest education level: Not on file  Occupational History   Not on file  Tobacco Use   Smoking status: Every Day    Current packs/day: 0.50    Average packs/day: 0.5 packs/day for 7.0 years (3.5 ttl pk-yrs)    Types: Cigarettes   Smokeless tobacco: Never  Vaping Use   Vaping status: Never Used  Substance and Sexual Activity   Alcohol use: Not Currently    Alcohol/week: 0.0 standard drinks of alcohol   Drug use: Not Currently    Types: Cocaine, Marijuana   Sexual activity: Yes    Birth control/protection: None  Other Topics Concern   Not on file  Social History Narrative   Not on  file   Social Drivers of Health   Tobacco Use: High Risk (10/12/2024)   Patient History    Smoking Tobacco Use: Every Day    Smokeless Tobacco Use: Never    Passive Exposure: Not on file  Financial Resource Strain: High Risk (08/26/2024)   Received from [facility]   Overall Financial Resource Strain (CARDIA)    How hard is it for you to pay for the very basics like food, housing, medical care, and heating?: Very hard  Food Insecurity: No Food Insecurity (10/12/2024)   Epic    Worried About Programme Researcher, Broadcasting/film/video in the Last Year: Never true    Ran Out of Food in the Last Year: Never true  Transportation Needs: Patient Declined (10/12/2024)   Epic    Lack of Transportation (Medical): Patient declined    Lack of Transportation (Non-Medical): Patient declined  Physical Activity: Insufficiently Active (08/26/2024)   Received from [facility]   Exercise Vital Sign    On average, how many days per week do you engage in moderate to strenuous exercise (like a brisk walk)?: 2 days    On average, how many minutes do you engage in exercise at this level?: 10 min  Stress: Stress Concern Present (08/26/2024)   Received from [facility]  [employer] of Occupational Health - Occupational Stress Questionnaire    Do you feel stress - tense, restless, nervous, or anxious, or unable to sleep at night because your mind is troubled all the time - these days?: Very much  Social Connections: Moderately Isolated (08/26/2024)   Received from [facility]   Social Connection and Isolation Panel    In a typical week, how many times do you talk on the phone with family, friends, or neighbors?: Twice a week    How often do you get together with friends or relatives?: Twice a week    How often do you attend church or religious services?: Never    Do you belong to any clubs or organizations such as church groups, unions, fraternal or athletic groups, or school groups?: No    How often do you  attend meetings of the clubs or organizations you belong to?: 1 to 4 times per year    Are you married, widowed, divorced, separated, never married, or living with a partner?: Never married  Depression (PHQ2-9): Not on file  Alcohol Screen: Low Risk (10/12/2024)   Alcohol Screen    Last Alcohol Screening Score (AUDIT): 0  Housing: Patient Declined (10/12/2024)   Epic    Unable to Pay for Housing in the Last Year: Patient declined    Number of Times Moved in the Last Year: Not on file    Homeless in the Last Year: Patient declined  Utilities: Not At Risk (10/12/2024)   Epic    Threatened with loss of utilities: No  Health Literacy: Low Risk (08/26/2024)   Received from [facility]    How often do you need to have someone help you when you read instructions, pamphlets, or other written material from your doctor or pharmacy?: Never   Past Medical History:  Past Medical History:  Diagnosis Date   Acute adjustment disorder with depressed mood 06/18/2023   ADHD    Anemia    BLOOD TRANSFUSION AFTER C-SECTION ON 06-2016-2 UNITS   Anxiety    Asthma    WELL CONTROLLED   B12 deficiency 06/24/2022   Bipolar 1 disorder, depressed (HCC)    COVID-19    Depression    Diabetes mellitus affecting pregnancy, unspecified trimester 09/19/2020   Diabetes mellitus without complication (HCC)    Gallstones    Gastroesophageal reflux disease 08/21/2010   GERD (gastroesophageal reflux disease)    Heart murmur    History of blood transfusion 06/2016   RECEIVED 2 UNITS OF BLOOD AFTER C SECTION   History of maternal blood transfusion, currently pregnant 04/19/2017   Malingering 05/03/2024   Menometrorrhagia 05/13/2022   Migraines    Ovarian cyst    Seizure disorder during pregnancy, third trimester (HCC) 05/10/2017   Seizures (HCC)    LAST SEIZURE January 2022   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   Smoking (tobacco) complicating pregnancy, unspecified trimester  05/10/2017   Substance induced mood disorder (HCC) 06/18/2023   Suicidal ideation 06/15/2013   Tubal pregnancy     Past Surgical History:  Procedure Laterality Date   CESAREAN SECTION N/A 06/22/2016   Procedure: CESAREAN SECTION;  Surgeon: Glory High, MD;  Location: [facility] ORS;  Service: Obstetrics;  Laterality: N/A;   CESAREAN SECTION N/A 10/12/2017   Procedure: CESAREAN SECTION;  Surgeon: High Glory, MD;  Location: [facility] ORS;  Service: Obstetrics;  Laterality: N/A;   CESAREAN SECTION N/A 03/27/2021   Procedure: CESAREAN SECTION;  Surgeon:  Victor Claudell SAUNDERS, MD;  Location: [facility] ORS;  Service: Obstetrics;  Laterality: N/A;   CHOLECYSTECTOMY N/A 08/13/2016   Procedure: LAPAROSCOPIC CHOLECYSTECTOMY;  Surgeon: Laneta JULIANNA Luna, MD;  Location: [facility] ORS;  Service: General;  Laterality: N/A;   DILATION AND CURETTAGE OF UTERUS     ovarian cyst removed     SALPINGECTOMY Right 2013   ectopic pregnancy. PER PATIENT, STILL HAS BOTH TUBES   TONSILLECTOMY     TONSILLECTOMY AND ADENOIDECTOMY      Current Medications: Current Facility-Administered Medications  Medication Dose Route Frequency Provider Last Rate Last Admin   acetaminophen  (TYLENOL ) tablet 650 mg  650 mg Oral Q6H PRN Madaram, Kondal R, MD   650 mg at 10/14/24 2119   alum & mag hydroxide-simeth (MAALOX/MYLANTA) 200-200-20 MG/5ML suspension 30 mL  30 mL Oral Q4H PRN Madaram, Kondal R, MD       haloperidol  lactate (HALDOL ) injection 5 mg  5 mg Intramuscular TID PRN Madaram, Kondal R, MD       And   diphenhydrAMINE  (BENADRYL ) injection 50 mg  50 mg Intramuscular TID PRN Madaram, Kondal R, MD       And   LORazepam  (ATIVAN ) injection 2 mg  2 mg Intramuscular TID PRN Madaram, Kondal R, MD       ferrous sulfate  tablet 325 mg  325 mg Oral QODAY Madaram, Kondal R, MD   325 mg at 10/17/24 1038   magnesium  hydroxide (MILK OF MAGNESIA) suspension 30 mL  30 mL Oral Daily PRN Madaram, Kondal R, MD       metFORMIN  (GLUCOPHAGE -XR) 24 hr tablet  500 mg  500 mg Oral Q breakfast Charma Domino, CNM   500 mg at 10/17/24 9180   prenatal vitamin w/FE, FA (NATACHEW) chewable tablet 1 tablet  1 tablet Oral Q1200 Charma Domino, CNM   1 tablet at 10/16/24 1300   QUEtiapine  (SEROQUEL ) tablet 100 mg  100 mg Oral QHS Montague, Crystal J, NP   100 mg at 10/16/24 2122    Lab Results:  Results for orders placed or performed during the hospital encounter of 10/12/24 (from the past 48 hours)  Hemoglobin A1c     Status: None   Collection Time: 10/15/24  2:30 PM  Result Value Ref Range   Hgb A1c MFr Bld 5.3 4.8 - 5.6 %    Comment: (NOTE) Diagnosis of Diabetes The following HbA1c ranges recommended by the American Diabetes Association (ADA) Jarman Litton be used as an aid in the diagnosis of diabetes mellitus.  Hemoglobin             Suggested A1C NGSP%              Diagnosis  <5.7                   Non Diabetic  5.7-6.4                Pre-Diabetic  >6.4                   Diabetic  <7.0                   Glycemic control for                       adults with diabetes.     Mean Plasma Glucose 105.41 mg/dL    Comment: Performed at [facility], [address] [place], [place] [address]  Lipid panel  Status: Abnormal   Collection Time: 10/15/24  2:30 PM  Result Value Ref Range   Cholesterol 174 0 - 200 mg/dL    Comment:        ATP III CLASSIFICATION:  <200     mg/dL   Desirable  799-760  mg/dL   Borderline High  >=759    mg/dL   High           Triglycerides 204 (H) <150 mg/dL   HDL 50 >59 mg/dL   Total CHOL/HDL Ratio 3.5 RATIO   VLDL 41 (H) 0 - 40 mg/dL   LDL Cholesterol 83 0 - 99 mg/dL    Comment:        Total Cholesterol/HDL:CHD Risk Coronary Heart Disease Risk Table                     Men   Women  1/2 Average Risk   3.4   3.3  Average Risk       5.0   4.4  2 X Average Risk   9.6   7.1  3 X Average Risk  23.4   11.0        Use the calculated Patient Ratio above and the CHD Risk Table to determine the patient's CHD Risk.         ATP III CLASSIFICATION (LDL):  <100     mg/dL   Optimal  899-870  mg/dL   Near or Above                    Optimal  130-159  mg/dL   Borderline  839-810  mg/dL   High  >809     mg/dL   Very High Performed at [facility], [address] [place], [place] [address]   Glucose, capillary     Status: Abnormal   Collection Time: 10/16/24  2:15 PM  Result Value Ref Range   Glucose-Capillary 116 (H) 70 - 99 mg/dL    Comment: Glucose reference range applies only to samples taken after fasting for at least 8 hours.  Glucose, capillary     Status: None   Collection Time: 10/16/24  7:22 PM  Result Value Ref Range   Glucose-Capillary 98 70 - 99 mg/dL    Comment: Glucose reference range applies only to samples taken after fasting for at least 8 hours.  Glucose, capillary     Status: None   Collection Time: 10/17/24 11:01 AM  Result Value Ref Range   Glucose-Capillary 98 70 - 99 mg/dL    Comment: Glucose reference range applies only to samples taken after fasting for at least 8 hours.     Blood Alcohol level:  Lab Results  Component Value Date   [facility] <15 10/12/2024   ETH <15 05/03/2024    Metabolic Disorder Labs: Lab Results  Component Value Date   HGBA1C 5.3 10/15/2024   MPG 105.41 10/15/2024   MPG 108 03/27/2021   Lab Results  Component Value Date   PROLACTIN 8.4 05/17/2019   Lab Results  Component Value Date   CHOL 174 10/15/2024   TRIG 204 (H) 10/15/2024   HDL 50 10/15/2024   CHOLHDL 3.5 10/15/2024   VLDL 41 (H) 10/15/2024   LDLCALC 83 10/15/2024    Physical Findings: AIMS:  , ,  ,  ,    CIWA:    COWS:      Psychiatric Specialty Exam:  Presentation  General Appearance:  Appropriate for Environment; Casual  Eye Contact: Good  Speech: Clear and Coherent; Normal Rate  Speech Volume: Normal    Mood and Affect  Mood: Euthymic  Affect: Congruent   Thought Process  Thought Processes: Coherent; Goal Directed;  Linear  Orientation:Full (Time, Place and Person)  Thought Content:Logical  Hallucinations:Hallucinations: None   Ideas of Reference:None  Suicidal Thoughts:Suicidal Thoughts: No   Homicidal Thoughts:Homicidal Thoughts: No    Sensorium  Memory: Immediate Fair; Recent Fair  Judgment: Improving  Insight: Fair   Chartered Certified Accountant: Fair  Attention Span: Fair  Recall: [employer] of Knowledge: Fair  Language: Fair   Psychomotor Activity  Psychomotor Activity: Psychomotor Activity: Normal   Musculoskeletal: Strength & Muscle Tone: within normal limits Gait & Station: normal Assets  Assets: Manufacturing Systems Engineer; Desire for Improvement; Resilience    Physical Exam: Physical Exam Vitals and nursing note reviewed.  Constitutional:      Appearance: Normal appearance.  Pulmonary:     Effort: Pulmonary effort is normal.  Neurological:     Mental Status: She is alert and oriented to person, place, and time.  Psychiatric:        Mood and Affect: Mood normal.    Review of Systems  Respiratory:  Negative for shortness of breath.   Cardiovascular:  Negative for chest pain.  Gastrointestinal:  Negative for diarrhea, nausea and vomiting.  Psychiatric/Behavioral:  Positive for substance abuse. Negative for depression, hallucinations and suicidal ideas. The patient is not nervous/anxious.   All other systems reviewed and are negative.  Blood pressure (!) 116/58, pulse 89, temperature 98 F (36.7 C), temperature source Oral, resp. rate 18, height 5' (1.524 m), weight 108.9 kg, SpO2 99%. Body mass index is 46.87 kg/m.  Diagnosis: Principal Problem:   Adjustment disorder with emotional disturbance   PLAN: Safety and Monitoring:  -- Voluntary admission to inpatient psychiatric unit for safety, stabilization and treatment  -- Daily contact with patient to assess and evaluate symptoms and progress in treatment  -- Patient's case to be  discussed in multi-disciplinary team meeting  -- Observation Level : q15 minute checks  -- Vital signs:  q12 hours  -- Precautions: suicide, elopement, and assault -- Encouraged patient to participate in unit milieu and in scheduled group therapies  2. Psychiatric Treatment:  Scheduled Medications:  -Continue Quetiapine  100 mg QHS for depression/mood.   -- The risks/benefits/side-effects/alternatives to this medication were discussed in detail with the patient and time was given for questions. The patient consents to medication trial.  3. Medical Issues Being Addressed:   -Patient is at [redacted] weeks gestation. OBGYN consulted and following MotherToBaby fact sheets provided to patient regarding SSRIs/ Seroquel  and pregnancy.   4. Discharge Planning:   -- Social work and case management to assist with discharge planning and identification of hospital follow-up needs prior to discharge  -- Estimated LOS: 5-7 days   -- Discharge Concerns: Need to establish a safety plan; Medication compliance and effectiveness.             -- Discharge Goals: Return home with outpatient referrals follow ups See social work note for CPS report outcome.   Eliazar Olivar, NP 10/17/2024, 1:02 PM  "

## 2024-10-17 NOTE — [facility] Suicide Risk Assessment (Signed)
 [facility] Discharge Suicide Risk Assessment   Principal Problem: Adjustment disorder with emotional disturbance Discharge Diagnoses: Principal Problem:   Adjustment disorder with emotional disturbance   Total Time spent with patient: 45 minutes  Musculoskeletal: Strength & Muscle Tone: within normal limits Gait & Station: normal Patient leans: N/A  Psychiatric Specialty Exam  Presentation  General Appearance:  Appropriate for Environment; Casual  Eye Contact: Good  Speech: Clear and Coherent; Normal Rate  Speech Volume: Normal  Handedness: Right   Mood and Affect  Mood: Euthymic  Duration of Depression Symptoms: Greater than two weeks  Affect: Congruent   Thought Process  Thought Processes: Coherent; Goal Directed; Linear  Descriptions of Associations:Intact  Orientation:Full (Time, Place and Person)  Thought Content:Logical  History of Schizophrenia/Schizoaffective disorder:No data recorded Duration of Psychotic Symptoms:No data recorded Hallucinations:Hallucinations: None  Ideas of Reference:None  Suicidal Thoughts:Suicidal Thoughts: No  Homicidal Thoughts:Homicidal Thoughts: No   Sensorium  Memory: Immediate Fair; Recent Fair  Judgment: Poor  Insight: Fair   Chartered Certified Accountant: Fair  Attention Span: Fair  Recall: [employer] of Knowledge: Fair  Language: Fair   Psychomotor Activity  Psychomotor Activity: Psychomotor Activity: Normal   Assets  Assets: Communication Skills; Desire for Improvement; Resilience   Sleep  Sleep: Sleep: Fair  Estimated Sleeping Duration (Last 24 Hours): 5.75-8.00 hours  Physical Exam: Physical Exam Vitals and nursing note reviewed.  Constitutional:      Appearance: Normal appearance.  Pulmonary:     Effort: Pulmonary effort is normal.  Neurological:     Mental Status: She is alert and oriented to person, place, and time.  Psychiatric:        Mood and Affect:  Mood normal.        Behavior: Behavior normal.        Thought Content: Thought content normal.    Review of Systems  Respiratory:  Negative for shortness of breath.   Cardiovascular:  Negative for chest pain.  Gastrointestinal:  Negative for diarrhea, nausea and vomiting.  Psychiatric/Behavioral:  Positive for substance abuse. Negative for depression, hallucinations and suicidal ideas. The patient is not nervous/anxious.   All other systems reviewed and are negative.  Blood pressure (!) 121/58, pulse 81, temperature 98.2 F (36.8 C), temperature source Oral, resp. rate 18, height 5' (1.524 m), weight 108.9 kg, SpO2 99%. Body mass index is 46.87 kg/m.  Mental Status Per Nursing Assessment::   On Admission:  Suicidal ideation indicated by patient, Self-harm behaviors  Demographic Factors:  Caucasian, Low socioeconomic status, and Unemployed  Loss Factors: Loss of significant relationship and Financial problems/change in socioeconomic status  Historical Factors: Impulsivity  Risk Reduction Factors:   Pregnancy  Continued Clinical Symptoms:  Alcohol/Substance Abuse/Dependencies Previous Psychiatric Diagnoses and Treatments  Cognitive Features That Contribute To Risk:  None    Suicide Risk:  Minimal: No identifiable suicidal ideation.  Patients presenting with no risk factors but with morbid ruminations; Faith [name] be classified as minimal risk based on the severity of the depressive symptoms   Follow-up Information     [facility] Follow up.   Contact information: [address] [place] [address] [phone]                 Plan Of Care/Follow-up recommendations:  Follow up with outpatient providers listed above.   Faith Zawistowski, NP 10/17/2024, 10:28 PM

## 2024-10-17 NOTE — Progress Notes (Signed)
" °   10/16/24 2000  Psych Admission Type (Psych Patients Only)  Admission Status Voluntary  Psychosocial Assessment  Patient Complaints None  Eye Contact Fair  Facial Expression Other (Comment) (unremarkable)  Affect Appropriate to circumstance  Speech Logical/coherent  Interaction Other (Comment) (unremarkable)  Motor Activity Slow  Appearance/Hygiene Improved  Behavior Characteristics Cooperative  Mood Pleasant  Thought Process  Coherency WDL  Content WDL  Delusions None reported or observed  Perception WDL  Hallucination None reported or observed  Judgment WDL  Confusion None  Danger to Self  Current suicidal ideation? Denies  Agreement Not to Harm Self Yes  Description of Agreement verbal  Danger to Others  Danger to Others None reported or observed    "

## 2024-10-17 NOTE — Progress Notes (Signed)
 Subjective: Pt doing ok, feels fetal movements denies contractions or concerns. She was told she was to get an US  and would like to know when that will happen.   Objective: BP (!) 116/58 (BP Location: Left Arm)   Pulse 89   Temp 98 F (36.7 C) (Oral)   Resp 18   Ht 5' (1.524 m)   Wt 108.9 kg   SpO2 99%   BMI 46.87 kg/m   I have reviewed patient's vital signs and labs. GEN: NAD  ABD: Gravid, soft, non tender Fetal heart tones 148   Assessment/Plan: [id number] at [redacted]w[redacted]d admitted to [facility] for  SI awaiting [facility] in [facility]   1)T2DM: glucose 98 at 1101 2)Fetal well being: Had anatomy US  12/29 next US  1/28, probably does not need US  at this time.  Daily fetal heart tones  3)Seizure disorder: Recommend Neurology consult for recommendations re: seizure medications. Keppra  is used in pregnancy, and she has been on that in the past. They would need to provide guidance re: initiation and maintenance.  4) Anemia: Iron  supplement every other day    Jinnie Cookey, [place]  [facility] 10/18/2023 12:57 PM        LOS: 5 days    JINNIE HERO [facility], CNM 10/17/2024, 12:55 PM

## 2024-10-17 NOTE — [facility] Counselor (Signed)
 CPS report was made on 10/16/24. Worker shared that they cannot do anything until the baby is born when speaking of pt's substance use. Patient's other children are under guardianship of her aunt, per pt, and have been for the past year. She shared that aunt will maintain guardianship until she is able to get stable housing. CPS informed of this. CSW was informed that it would be brought before supervisor. No other concerns expressed. Contact ended without incident.   Nadara SAUNDERS. Chaim, MSW, LCSW, LCAS 10/17/2024 10:01 AM

## 2024-10-17 NOTE — Group Note (Signed)
 Date:  10/17/2024 Time:  4:00 PM  Group Topic/Focus:  Goals Group:   The focus of this group is to help patients establish daily goals to achieve during treatment and discuss how the patient can incorporate goal setting into their daily lives to aide in recovery.    Participation Level:  Did Not Attend   [name] [name] 10/17/2024, 4:00 PM

## 2024-10-17 NOTE — Group Note (Signed)
 LCSW Group Therapy Note  Group Date: 10/17/2024 Start Time: 1245 End Time: 1340   Type of Therapy and Topic:  Group Therapy: Positive Affirmations  Participation Level:  Did Not Attend   Description of Group:   This group addressed positive affirmation towards self and others.  Patients went around the room and identified two positive things about themselves and two positive things about a peer in the room.  Patients reflected on how it felt to share something positive with others, to identify positive things about themselves, and to hear positive things from others/ Patients were encouraged to have a daily reflection of positive characteristics or circumstances.   Therapeutic Goals: Patients will verbalize two of their positive qualities Patients will demonstrate empathy for others by stating two positive qualities about a peer in the group Patients will verbalize their feelings when voicing positive self affirmations and when voicing positive affirmations of others Patients will discuss the potential positive impact on their wellness/recovery of focusing on positive traits of self and others.  Summary of Patient Progress:  Patient did not attend.   Therapeutic Modalities:   Cognitive Behavioral Therapy Motivational Interviewing    Faith [name], [place] 10/17/2024  1:42 PM

## 2024-10-17 NOTE — [facility] Suicide Risk Assessment (Signed)
 [facility] INPATIENT:  Family/Significant Other Suicide Prevention Education  Suicide Prevention Education:  Education Completed; Faith [name]/sister [phone]), has been identified by the patient as the family member/significant other with whom the patient will be residing, and identified as the person(s) who will aid the patient in the event of a mental health crisis (suicidal ideations/suicide attempt).  With written consent from the patient, the family member/significant other has been provided the following suicide prevention education, prior to the and/or following the discharge of the patient.  The suicide prevention education provided includes the following: Suicide risk factors Suicide prevention and interventions National Suicide Hotline telephone number [facility] assessment telephone number [facility] Emergency Assistance 911 [facility] and/or Residential Mobile Crisis Unit telephone number  Request made of family/significant other to: Remove weapons (e.g., guns, rifles, knives), all items previously/currently identified as safety concern.   Remove drugs/medications (over-the-counter, prescriptions, illicit drugs), all items previously/currently identified as a safety concern.  The family member/significant other verbalizes understanding of the suicide prevention education information provided.  The family member/significant other agrees to remove the items of safety concern listed above.  She denied any concerns. She stated that she does not feel that her sister is a danger to herself or anyone else. She denied pt having any access to weapons. She confirmed that pt will be coming to her house upon discharge and she will be picking pt up. No other concerns expressed. Contact ended without incident.   Faith [name] Fam 10/17/2024, 3:05 PM

## 2024-10-17 NOTE — Group Note (Signed)
 Recreation Therapy Group Note   Group Topic:Relaxation  Group Date: 10/17/2024 Start Time: 1525 End Time: 1605 Facilitators: Celestia Jeoffrey BRAVO, LRT, CTRS Location: Courtyard  Group Description: Meditation. LRT and patients discussed what they know about meditation and mindfulness. LRT played a Deep Breathing Meditation exercise script for patients to follow along to. LRT and patients discussed how meditation and deep breathing can be used as a coping skill post--discharge to help manage symptoms of stress.   Goal Area(s) Addressed: Patient will practice using relaxation technique. Patient will identify a new coping skill.  Patient will follow multistep directions to reduce anxiety and stress.   Affect/Mood: Appropriate   Participation Level: Active and Engaged   Participation Quality: Independent   Behavior: Calm and Cooperative   Speech/Thought Process: Coherent   Insight: Fair   Judgement: Fair    Modes of Intervention: Clarification, Education, and Exploration   Patient Response to Interventions:  Receptive   Education Outcome:  Acknowledges education   Clinical Observations/Individualized Feedback: Faith [name] was active in their participation of session activities and group discussion. Pt interacted well with LRT and peers duration of session.    Plan: Continue to engage patient in RT group sessions 2-3x/week.   Jeoffrey BRAVO Celestia, LRT, CTRS 10/17/2024 5:12 PM

## 2024-10-17 NOTE — Plan of Care (Signed)

## 2024-10-17 NOTE — Group Note (Signed)
 Recreation Therapy Group Note   Group Topic:Coping Skills  Group Date: 10/17/2024 Start Time: 1010 End Time: 1050 Facilitators: Celestia Jeoffrey BRAVO, LRT, CTRS Location: Craft Room  Group Description: Mind Map.  Patient was provided a blank template of a diagram with 32 blank boxes in a tiered system, branching from the center (similar to a bubble chart). LRT directed patients to label the middle of the diagram Coping Skills. LRT and patients then came up with 8 different coping skills as examples. Pt were directed to record their coping skills in the 2nd tier boxes closest to the center.  Patients would then share their coping skills with the group as LRT wrote them out. LRT gave a handout of 99 different coping skills at the end of group.   Goal Area(s) Addressed: Patients will be able to define coping skills. Patient will identify new coping skills.  Patient will increase communication.   Affect/Mood: N/A   Participation Level: Did not attend    Clinical Observations/Individualized Feedback: Patient did not attend.  Plan: Continue to engage patient in RT group sessions 2-3x/week.   Jeoffrey BRAVO Celestia, LRT, CTRS 10/17/2024 11:16 AM

## 2024-10-18 MED ORDER — FERROUS SULFATE 325 (65 FE) MG PO TABS: 325.0000 mg | ORAL_TABLET | ORAL | 0 refills | Status: AC

## 2024-10-18 MED ORDER — NICOTINE POLACRILEX 4 MG MT GUM: 4.0000 mg | CHEWING_GUM | OROMUCOSAL | 0 refills | Status: AC | PRN

## 2024-10-18 MED ORDER — QUETIAPINE FUMARATE 100 MG PO TABS: 100.0000 mg | ORAL_TABLET | Freq: Every day | ORAL | 0 refills | Status: AC

## 2024-10-18 NOTE — Progress Notes (Signed)
" °  [facility] Adult Case Management Discharge Plan :  Will you be returning to the same living situation after discharge:  No. At discharge, do you have transportation home?: Yes,  pt sister to provide transportation.  Do you have the ability to pay for your medications: Yes,  [employer].  Release of information consent forms completed and in the chart;  Patient's signature needed at discharge.  Patient to Follow up at:  Follow-up Information     [facility] Follow up.   Why: Your appointment is scheduled for 10/25/24 at 12PM. It is a virtual appointment and they will reach out to you at [phone]. Contact information: [address]  [address] [place] [address] [phone]                 Next level of care provider has access to [facility] Link:no  Safety Planning and Suicide Prevention discussed: Yes,  SPE completed with sister, Faith [name].      Has patient been referred to the Quitline?: Patient refused referral for treatment  Patient has been referred for addiction treatment: Patient refused referral for treatment.  Faith JONELLE Fam, LCSW 10/18/2024, 10:52 AM "

## 2024-10-18 NOTE — Progress Notes (Signed)
 Subjective: Pt doing well, feeling good fetal movement. Denies cramping, vaginal discharge, vaginal bleeding. State she is going home today.   Objective: Today's Vitals   10/17/24 2000 10/18/24 0632 10/18/24 0635 10/18/24 0900  BP:  (!) 77/32 (!) 107/56   Pulse:  86 81   Resp:  16    Temp:  97.7 F (36.5 C)    TempSrc:  Oral    SpO2:  98% 99%   Weight:      Height:      PainSc: 0-No pain   0-No pain   Body mass index is 46.87 kg/m.  Gen:NAD ABD: soft gravid, non distended Fetal heart tones 145  Assessment/Plan: H2E6966 at [redacted]w[redacted]d admitted to [facility] for  SI awaiting placement in [facility]    1)T2DM: glucose 111 @1515  2)Fetal well being: Had anatomy US  12/29 next US  1/28, PT to get u/s as scheduled outpatient. Daily fetal heart tones  3)Seizure disorder: Recommend Neurology consult for recommendations. If not done prior to discharge , recommend out patient consult.  4) Anemia: Iron  supplement every other day   Zelda Hummer, CNM

## 2024-10-18 NOTE — Progress Notes (Signed)
 D: Pt A & O X 4. Denies SI, HI, AVH and pain at this time. D/C home as ordered. Pt picked up by family member; sister.  A: D/C instructions reviewed with pt including prescriptions and follow up appointments; compliance encouraged. All belongings from assigned locker returned to pt at time of departure. Scheduled medication administered with verbal education and effects monitored. Safety checks maintained without incident till time of d/c.  R: Pt receptive to care. Compliant with medications when offered. Denies adverse drug reactions when assessed. Verbalized understanding related to d/c instructions. Signed belonging sheet in agreement with items received from locker. Ambulatory with a steady gait. Appears to be in no physical distress at time of departure.

## 2024-10-26 ENCOUNTER — Encounter: Payer: Self-pay | Admitting: Obstetrics and Gynecology

## 2024-10-26 ENCOUNTER — Ambulatory Visit: Payer: MEDICAID | Admitting: Obstetrics and Gynecology

## 2024-10-26 ENCOUNTER — Other Ambulatory Visit: Payer: Self-pay

## 2024-10-26 VITALS — BP 111/74 | HR 125 | Wt 238.0 lb

## 2024-10-26 DIAGNOSIS — O09892 Supervision of other high risk pregnancies, second trimester: Secondary | ICD-10-CM | POA: Diagnosis not present

## 2024-10-26 DIAGNOSIS — Z3A23 23 weeks gestation of pregnancy: Secondary | ICD-10-CM

## 2024-10-26 DIAGNOSIS — F1990 Other psychoactive substance use, unspecified, uncomplicated: Secondary | ICD-10-CM

## 2024-10-26 DIAGNOSIS — Z862 Personal history of diseases of the blood and blood-forming organs and certain disorders involving the immune mechanism: Secondary | ICD-10-CM | POA: Diagnosis not present

## 2024-10-26 DIAGNOSIS — O0992 Supervision of high risk pregnancy, unspecified, second trimester: Secondary | ICD-10-CM

## 2024-10-26 DIAGNOSIS — R569 Unspecified convulsions: Secondary | ICD-10-CM

## 2024-10-26 DIAGNOSIS — Z8659 Personal history of other mental and behavioral disorders: Secondary | ICD-10-CM | POA: Diagnosis not present

## 2024-10-26 DIAGNOSIS — O0990 Supervision of high risk pregnancy, unspecified, unspecified trimester: Secondary | ICD-10-CM

## 2024-10-26 DIAGNOSIS — Z141 Cystic fibrosis carrier: Secondary | ICD-10-CM

## 2024-10-26 DIAGNOSIS — G40909 Epilepsy, unspecified, not intractable, without status epilepticus: Secondary | ICD-10-CM

## 2024-10-26 DIAGNOSIS — Z98891 History of uterine scar from previous surgery: Secondary | ICD-10-CM

## 2024-10-26 DIAGNOSIS — E119 Type 2 diabetes mellitus without complications: Secondary | ICD-10-CM | POA: Diagnosis not present

## 2024-10-26 DIAGNOSIS — D509 Iron deficiency anemia, unspecified: Secondary | ICD-10-CM

## 2024-10-26 DIAGNOSIS — O09899 Supervision of other high risk pregnancies, unspecified trimester: Secondary | ICD-10-CM

## 2024-10-26 DIAGNOSIS — E669 Obesity, unspecified: Secondary | ICD-10-CM

## 2024-10-26 NOTE — Progress Notes (Signed)
 "  PRENATAL VISIT NOTE  Subjective:  Faith [name] is a 34 y.o. [phone] at [redacted]w[redacted]d being seen today for ongoing prenatal care.  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  Patient reports no complaints.  Contractions: Not present. Vag. Bleeding: None.  Movement: Present. Denies leaking of fluid.   The following portions of the patient's history were reviewed and updated as appropriate: allergies, current medications, past family history, past medical history, past social history, past surgical history and problem list.   Objective:   Vitals:   10/26/24 1539  BP: 111/74  Pulse: (!) 125  Weight: 238 lb (108 kg)    Fetal Status:  Fetal Heart Rate (bpm): 154   Movement: Present    General: Alert, oriented and cooperative. Patient is in no acute distress.  Skin: Skin is warm and dry. No rash noted.   Cardiovascular: Normal heart rate noted  Respiratory: Normal respiratory effort, no problems with respiration noted  Abdomen: Soft, gravid, appropriate for gestational age.  Pain/Pressure: Absent     Pelvic: Cervical exam deferred        Extremities: Normal range of motion.  Edema: None  Mental Status: Normal mood and affect. Normal behavior. Normal judgment and thought content.       No data to display               No data to display          Assessment and Plan:  Pregnancy: [id number]  at [redacted]w[redacted]d  1. History of cesarean section (Primary) X3 Will need repeat She does not report any issues with any c-section -surg request sent for 39 weeks  2. Iron  deficiency anemia, unspecified iron  deficiency anemia type Taking iron  pills  3. Cystic fibrosis carrier, antepartum  4. Supervision of high risk pregnancy, antepartum  5. History of anemia  6. Type 2 diabetes mellitus in patient with obesity  -on metformin  500 mg once daily -reports she is compliant with checking CBGs and they are mostly 80s-90s both fasting and PP -fetal echo ordered -f/u growth US  scheduled for 11/06/24  7. History of suicidal ideation - Took 7 keppra  pills after she was unable to get a detox after reaching out to crisis hotline, went ot hospital for SI and was admitted 10/12/24 - Started on seroquel  in hospital, feels stable on that, though makes her sleepy -has f/u on 11/06/24 with [facility] - offered [facility] for support prn, she accepts -no SI/HI today  8. Seizure-like activity (HCC)  9. Substance use UDS today, pt consents Reports she quit cocaine when she found out she was pregnant and has not used since Reports no ongoing substance use Declines [facility] clinic  10. Nonintractable epilepsy without status epilepticus, unspecified epilepsy type (HCC) On keppra  500 mg BID Last seizure was prior to admission for [facility] Referral to neurology placed   Preterm labor symptoms and general obstetric precautions including but not limited to vaginal bleeding, contractions, leaking of fluid and fetal movement were reviewed in detail with the patient. Please refer to After  Visit Summary for other counseling recommendations.   Return in about 1 month (around 11/26/2024) for high OB.  Future Appointments  Date Time Provider Department Center  11/06/2024  3:30 PM [facility] US3 [facility]    Burnard CHRISTELLA Moats, MD  "

## 2024-10-27 ENCOUNTER — Encounter: Payer: Self-pay | Admitting: Internal Medicine

## 2024-10-27 ENCOUNTER — Telehealth: Payer: Self-pay | Admitting: Family Medicine

## 2024-10-27 NOTE — Telephone Encounter (Signed)
 Patient demographics and office visit notes were successfully sent to Dr.Cottons office on 10/27/24.

## 2024-11-01 ENCOUNTER — Ambulatory Visit: Payer: MEDICAID

## 2024-11-01 DIAGNOSIS — O09899 Supervision of other high risk pregnancies, unspecified trimester: Secondary | ICD-10-CM

## 2024-11-06 ENCOUNTER — Ambulatory Visit: Payer: MEDICAID

## 2024-11-20 ENCOUNTER — Encounter: Payer: Self-pay | Admitting: Obstetrics and Gynecology

## 2024-11-22 ENCOUNTER — Ambulatory Visit: Payer: MEDICAID
